# Patient Record
Sex: Female | Born: 1960 | Race: White | Hispanic: No | Smoking: Never smoker
Health system: Southern US, Community
[De-identification: ages and names within clinical notes are randomized; demographics above are authoritative.]

## PROBLEM LIST (undated history)

## (undated) DIAGNOSIS — E079 Disorder of thyroid, unspecified: Secondary | ICD-10-CM

## (undated) DIAGNOSIS — I2510 Atherosclerotic heart disease of native coronary artery without angina pectoris: Secondary | ICD-10-CM

## (undated) DIAGNOSIS — I011 Acute rheumatic endocarditis: Secondary | ICD-10-CM

## (undated) DIAGNOSIS — I10 Essential (primary) hypertension: Secondary | ICD-10-CM

## (undated) DIAGNOSIS — F319 Bipolar disorder, unspecified: Secondary | ICD-10-CM

## (undated) DIAGNOSIS — E7800 Pure hypercholesterolemia, unspecified: Secondary | ICD-10-CM

## (undated) DIAGNOSIS — M549 Dorsalgia, unspecified: Secondary | ICD-10-CM

## (undated) DIAGNOSIS — R519 Headache, unspecified: Secondary | ICD-10-CM

## (undated) DIAGNOSIS — E119 Type 2 diabetes mellitus without complications: Secondary | ICD-10-CM

## (undated) DIAGNOSIS — R51 Headache: Secondary | ICD-10-CM

## (undated) HISTORY — PX: TUBAL LIGATION: SHX77

## (undated) HISTORY — DX: Atherosclerotic heart disease of native coronary artery without angina pectoris: I25.10

## (undated) HISTORY — DX: Type 2 diabetes mellitus without complications: E11.9

## (undated) HISTORY — DX: Headache: R51

## (undated) HISTORY — DX: Acute rheumatic endocarditis: I01.1

## (undated) HISTORY — DX: Essential (primary) hypertension: I10

## (undated) HISTORY — DX: Bipolar disorder, unspecified: F31.9

## (undated) HISTORY — DX: Disorder of thyroid, unspecified: E07.9

## (undated) HISTORY — DX: Headache, unspecified: R51.9

## (undated) HISTORY — DX: Morbid (severe) obesity due to excess calories: E66.01

## (undated) HISTORY — DX: Pure hypercholesterolemia, unspecified: E78.00

## (undated) HISTORY — DX: Dorsalgia, unspecified: M54.9

---

## 2000-09-01 ENCOUNTER — Ambulatory Visit (HOSPITAL_COMMUNITY): Admission: RE | Admit: 2000-09-01 | Discharge: 2000-09-01 | Payer: Self-pay | Admitting: Endocrinology

## 2000-09-01 ENCOUNTER — Encounter: Payer: Self-pay | Admitting: Endocrinology

## 2001-01-14 ENCOUNTER — Emergency Department (HOSPITAL_COMMUNITY): Admission: EM | Admit: 2001-01-14 | Discharge: 2001-01-14 | Payer: Self-pay | Admitting: Emergency Medicine

## 2001-03-27 ENCOUNTER — Emergency Department (HOSPITAL_COMMUNITY): Admission: EM | Admit: 2001-03-27 | Discharge: 2001-03-27 | Payer: Self-pay | Admitting: *Deleted

## 2003-08-25 ENCOUNTER — Emergency Department (HOSPITAL_COMMUNITY): Admission: EM | Admit: 2003-08-25 | Discharge: 2003-08-25 | Payer: Self-pay | Admitting: Emergency Medicine

## 2004-03-13 ENCOUNTER — Ambulatory Visit (HOSPITAL_COMMUNITY): Admission: RE | Admit: 2004-03-13 | Discharge: 2004-03-13 | Payer: Self-pay | Admitting: Family Medicine

## 2005-01-28 ENCOUNTER — Other Ambulatory Visit: Admission: RE | Admit: 2005-01-28 | Discharge: 2005-01-28 | Payer: Self-pay | Admitting: Gynecology

## 2005-03-01 ENCOUNTER — Inpatient Hospital Stay (HOSPITAL_COMMUNITY): Admission: EM | Admit: 2005-03-01 | Discharge: 2005-03-04 | Payer: Self-pay | Admitting: Emergency Medicine

## 2005-03-17 ENCOUNTER — Ambulatory Visit (HOSPITAL_COMMUNITY): Admission: RE | Admit: 2005-03-17 | Discharge: 2005-03-17 | Payer: Self-pay | Admitting: Internal Medicine

## 2005-04-01 ENCOUNTER — Ambulatory Visit: Admission: RE | Admit: 2005-04-01 | Discharge: 2005-04-01 | Payer: Self-pay | Admitting: Gynecology

## 2005-04-06 ENCOUNTER — Emergency Department (HOSPITAL_COMMUNITY): Admission: EM | Admit: 2005-04-06 | Discharge: 2005-04-06 | Payer: Self-pay | Admitting: Emergency Medicine

## 2006-06-27 ENCOUNTER — Emergency Department (HOSPITAL_COMMUNITY): Admission: EM | Admit: 2006-06-27 | Discharge: 2006-06-27 | Payer: Self-pay | Admitting: Emergency Medicine

## 2006-10-04 ENCOUNTER — Emergency Department (HOSPITAL_COMMUNITY): Admission: EM | Admit: 2006-10-04 | Discharge: 2006-10-04 | Payer: Self-pay | Admitting: Emergency Medicine

## 2007-06-17 ENCOUNTER — Emergency Department (HOSPITAL_COMMUNITY): Admission: EM | Admit: 2007-06-17 | Discharge: 2007-06-17 | Payer: Self-pay | Admitting: Emergency Medicine

## 2008-04-06 ENCOUNTER — Emergency Department (HOSPITAL_COMMUNITY): Admission: EM | Admit: 2008-04-06 | Discharge: 2008-04-06 | Payer: Self-pay | Admitting: Emergency Medicine

## 2008-05-23 ENCOUNTER — Emergency Department (HOSPITAL_COMMUNITY): Admission: EM | Admit: 2008-05-23 | Discharge: 2008-05-23 | Payer: Self-pay | Admitting: Emergency Medicine

## 2009-06-27 ENCOUNTER — Observation Stay (HOSPITAL_COMMUNITY): Admission: EM | Admit: 2009-06-27 | Discharge: 2009-07-01 | Payer: Self-pay | Admitting: Emergency Medicine

## 2009-06-27 ENCOUNTER — Ambulatory Visit: Payer: Self-pay | Admitting: Cardiology

## 2009-06-30 ENCOUNTER — Encounter: Payer: Self-pay | Admitting: Family Medicine

## 2009-07-07 DIAGNOSIS — F3189 Other bipolar disorder: Secondary | ICD-10-CM | POA: Insufficient documentation

## 2009-07-07 DIAGNOSIS — J45909 Unspecified asthma, uncomplicated: Secondary | ICD-10-CM | POA: Insufficient documentation

## 2009-07-07 DIAGNOSIS — I10 Essential (primary) hypertension: Secondary | ICD-10-CM | POA: Insufficient documentation

## 2009-07-07 DIAGNOSIS — R079 Chest pain, unspecified: Secondary | ICD-10-CM

## 2009-07-09 ENCOUNTER — Ambulatory Visit: Payer: Self-pay | Admitting: Cardiology

## 2009-07-10 ENCOUNTER — Emergency Department (HOSPITAL_COMMUNITY): Admission: EM | Admit: 2009-07-10 | Discharge: 2009-07-10 | Payer: Self-pay | Admitting: Emergency Medicine

## 2009-07-30 ENCOUNTER — Ambulatory Visit: Payer: Self-pay | Admitting: Cardiology

## 2009-07-30 ENCOUNTER — Encounter (HOSPITAL_COMMUNITY): Admission: RE | Admit: 2009-07-30 | Discharge: 2009-08-29 | Payer: Self-pay | Admitting: Cardiology

## 2009-08-04 ENCOUNTER — Encounter: Payer: Self-pay | Admitting: Cardiology

## 2009-11-14 ENCOUNTER — Ambulatory Visit (HOSPITAL_COMMUNITY): Admission: RE | Admit: 2009-11-14 | Discharge: 2009-11-14 | Payer: Self-pay | Admitting: Otolaryngology

## 2009-12-01 ENCOUNTER — Ambulatory Visit (HOSPITAL_COMMUNITY): Admission: RE | Admit: 2009-12-01 | Discharge: 2009-12-01 | Payer: Self-pay | Admitting: Otolaryngology

## 2010-02-17 ENCOUNTER — Ambulatory Visit (HOSPITAL_COMMUNITY): Admission: RE | Admit: 2010-02-17 | Discharge: 2010-02-17 | Payer: Self-pay | Admitting: Pulmonary Disease

## 2010-02-18 ENCOUNTER — Ambulatory Visit (HOSPITAL_COMMUNITY): Admission: RE | Admit: 2010-02-18 | Discharge: 2010-02-18 | Payer: Self-pay | Admitting: Pulmonary Disease

## 2010-03-03 ENCOUNTER — Encounter (INDEPENDENT_AMBULATORY_CARE_PROVIDER_SITE_OTHER): Payer: Self-pay | Admitting: Pulmonary Disease

## 2010-03-03 ENCOUNTER — Ambulatory Visit (HOSPITAL_COMMUNITY): Admission: RE | Admit: 2010-03-03 | Discharge: 2010-03-03 | Payer: Self-pay | Admitting: Pulmonary Disease

## 2010-03-03 ENCOUNTER — Ambulatory Visit: Payer: Self-pay | Admitting: Cardiology

## 2010-03-07 IMAGING — NM NM MYOCAR MULTI W/SPECT W/WALL MOTION & EF
2 series · 12 of 12 positions shown · non-contrast
Comparison: none

nm myoview pharmacologic stress

Ordering Physician: Natanael Joon
Absah Physician: [REDACTED]al Data: 48-year-old woman with multiple cardiovascular risk
factors, asthma and recent hospital admission for chest pain.
NUCLEAR MEDICINE ADENOSINE STRESS MYOVIEW STUDY WITH SPECT AND LEFT
VENTRIUCLAR EJECTION FRACTION
Radionuclide Data: One-day rest/stress protocol performed with
[DATE] mCi of Zc-KKm Myoview.
Stress Data: Progressive dobutamine infusion of to a maximal dose
of 40 mcg/kg per minute resulted in a peak heart rate of 139, 81%
of age predicted maximum.   Blood pressure rose slightly at low
dose and and decreased modestly at high  dose with a subsequent
increase to at peak of 160/80 following termination of the drug
infusion.  No arrhythmias noted.
EKG: Normal sinus rhythm; probable prior anterior myocardial
infarction.
Stress EKG:  No significant change.
Scintigraphic Data: Acquisition  notable for moderate breast
attenuation plus a component of diaphragmatic attenuation.  Left
ventricular size was normal.  On tomographic images reconstructed
in standard planes, there was patchy uptake of tracer and poor
image quality due to substantial attenuation.  A small and mild
inferior defect was identified that was somewhat less apparent on
the resting study indicating modest reversibility.  A small
persistent defect of mild to moderate intensity is seen at the base
of the anterior wall.
The gated reconstruction demonstrated normal regional and global LV
systolic function as well as normal systolic accentuation of
activity throughout.  Estimated ejection fraction was 0.62.

[Series 1: cr cardiac tc low dose · 6.41mm/px · 6 of 64 frames shown]
[frame 6/64]
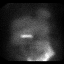
[frame 16/64]
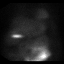
[frame 27/64]
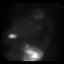
[frame 38/64]
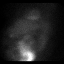
[frame 48/64]
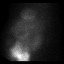
[frame 59/64]
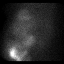

[Series 1: cs cardiac tc hi dose · 6.41mm/px · 6 of 512 frames shown]
[frame 43/512]
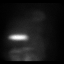
[frame 128/512]
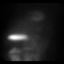
[frame 214/512]
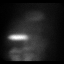
[frame 299/512]
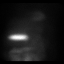
[frame 384/512]
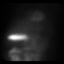
[frame 470/512]
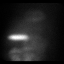

[12 of 12 positions shown; findings below may reference images not displayed]

IMPRESSION: Probably negative  pharmacologic stress nuclear myocardial study
revealing no significant stress-induced EKG abnormalities, normal
left ventricular size and normal left ventricular systolic
function.  By scintigraphic imaging, there was diaphragmatic and
breast attenuation without convincing evidence for ischemia or
infarction.  Other findings as noted.  The possibility of mild
scarring in the inferior and anterior segments can not be
unequivocally excluded.

## 2010-04-22 ENCOUNTER — Ambulatory Visit: Admission: RE | Admit: 2010-04-22 | Discharge: 2010-04-22 | Payer: Self-pay | Admitting: Neurology

## 2010-04-23 ENCOUNTER — Emergency Department (HOSPITAL_COMMUNITY): Admission: EM | Admit: 2010-04-23 | Discharge: 2010-04-23 | Payer: Self-pay | Admitting: Emergency Medicine

## 2010-05-14 ENCOUNTER — Other Ambulatory Visit: Payer: Self-pay | Admitting: Emergency Medicine

## 2010-05-15 ENCOUNTER — Other Ambulatory Visit: Payer: Self-pay | Admitting: Emergency Medicine

## 2010-05-15 ENCOUNTER — Inpatient Hospital Stay (HOSPITAL_COMMUNITY)
Admission: AD | Admit: 2010-05-15 | Discharge: 2010-05-22 | Payer: Self-pay | Source: Home / Self Care | Admitting: Psychiatry

## 2010-05-15 ENCOUNTER — Ambulatory Visit: Payer: Self-pay | Admitting: Psychiatry

## 2010-06-16 ENCOUNTER — Ambulatory Visit (HOSPITAL_COMMUNITY): Payer: Self-pay | Admitting: Physician Assistant

## 2010-07-09 ENCOUNTER — Ambulatory Visit (HOSPITAL_COMMUNITY): Payer: Self-pay | Admitting: Licensed Clinical Social Worker

## 2010-07-23 ENCOUNTER — Ambulatory Visit (HOSPITAL_COMMUNITY): Payer: Self-pay | Admitting: Licensed Clinical Social Worker

## 2010-07-31 ENCOUNTER — Ambulatory Visit (HOSPITAL_COMMUNITY): Payer: Self-pay | Admitting: Licensed Clinical Social Worker

## 2010-08-04 ENCOUNTER — Ambulatory Visit (HOSPITAL_COMMUNITY): Payer: Self-pay | Admitting: Licensed Clinical Social Worker

## 2010-08-12 ENCOUNTER — Ambulatory Visit (HOSPITAL_COMMUNITY): Payer: Self-pay | Admitting: Licensed Clinical Social Worker

## 2010-08-28 ENCOUNTER — Ambulatory Visit (HOSPITAL_COMMUNITY): Payer: Self-pay | Admitting: Licensed Clinical Social Worker

## 2010-09-03 ENCOUNTER — Ambulatory Visit (HOSPITAL_COMMUNITY): Payer: Self-pay | Admitting: Physician Assistant

## 2010-09-04 ENCOUNTER — Ambulatory Visit (HOSPITAL_COMMUNITY): Payer: Self-pay | Admitting: Licensed Clinical Social Worker

## 2010-09-04 ENCOUNTER — Emergency Department (HOSPITAL_COMMUNITY)
Admission: EM | Admit: 2010-09-04 | Discharge: 2010-09-05 | Disposition: A | Discharge status: Other Acute Inpatient Hospital | Payer: Self-pay | Source: Home / Self Care | Admitting: Emergency Medicine

## 2010-09-05 ENCOUNTER — Inpatient Hospital Stay (HOSPITAL_COMMUNITY)
Admission: RE | Admit: 2010-09-05 | Discharge: 2010-09-08 | Payer: Self-pay | Source: Home / Self Care | Attending: Psychiatry | Admitting: Psychiatry

## 2010-09-08 ENCOUNTER — Emergency Department (HOSPITAL_COMMUNITY)
Admission: EM | Admit: 2010-09-08 | Discharge: 2010-09-09 | Disposition: A | Discharge status: Other Acute Inpatient Hospital | Payer: Self-pay | Source: Home / Self Care | Admitting: Emergency Medicine

## 2010-09-09 ENCOUNTER — Ambulatory Visit (HOSPITAL_COMMUNITY): Payer: Self-pay | Admitting: Physician Assistant

## 2010-09-11 ENCOUNTER — Ambulatory Visit (HOSPITAL_COMMUNITY): Payer: Self-pay | Admitting: Licensed Clinical Social Worker

## 2010-10-18 ENCOUNTER — Encounter: Payer: Self-pay | Admitting: Family Medicine

## 2010-11-03 ENCOUNTER — Encounter (HOSPITAL_COMMUNITY): Payer: Self-pay | Admitting: Physician Assistant

## 2010-11-24 ENCOUNTER — Encounter (HOSPITAL_COMMUNITY): Payer: Medicare Other | Admitting: Physician Assistant

## 2010-11-24 DIAGNOSIS — F309 Manic episode, unspecified: Secondary | ICD-10-CM

## 2010-11-25 ENCOUNTER — Encounter (HOSPITAL_COMMUNITY): Payer: Medicare Other | Admitting: Physician Assistant

## 2010-12-07 LAB — URINALYSIS, ROUTINE W REFLEX MICROSCOPIC
Bilirubin Urine: NEGATIVE
Glucose, UA: NEGATIVE mg/dL
Nitrite: NEGATIVE
Protein, ur: NEGATIVE mg/dL
Specific Gravity, Urine: 1.02 (ref 1.005–1.030)
Urobilinogen, UA: 0.2 mg/dL (ref 0.0–1.0)
pH: 5 (ref 5.0–8.0)

## 2010-12-07 LAB — BASIC METABOLIC PANEL
CO2: 28 mEq/L (ref 19–32)
Calcium: 9.5 mg/dL (ref 8.4–10.5)
Creatinine, Ser: 1.23 mg/dL — ABNORMAL HIGH (ref 0.4–1.2)
GFR calc Af Amer: 56 mL/min — ABNORMAL LOW (ref >60)

## 2010-12-07 LAB — CBC
Hemoglobin: 11.9 g/dL — ABNORMAL LOW (ref 12.0–15.0)
MCH: 28.9 pg (ref 26.0–34.0)
MCHC: 34.5 g/dL (ref 30.0–36.0)
RDW: 13.9 % (ref 11.5–15.5)
WBC: 8.7 10*3/uL (ref 4.0–10.5)

## 2010-12-07 LAB — DIFFERENTIAL
Eosinophils Absolute: 0.4 10*3/uL (ref 0.0–0.7)
Eosinophils Relative: 5 % (ref 0–5)
Lymphocytes Relative: 25 % (ref 12–46)
Neutro Abs: 5.6 10*3/uL (ref 1.7–7.7)

## 2010-12-07 LAB — GLUCOSE, CAPILLARY

## 2010-12-07 LAB — URINE MICROSCOPIC-ADD ON

## 2010-12-07 LAB — RAPID URINE DRUG SCREEN, HOSP PERFORMED: Tetrahydrocannabinol: NOT DETECTED

## 2010-12-08 LAB — DIFFERENTIAL
Basophils Absolute: 0 K/uL (ref 0.0–0.1)
Basophils Relative: 1 % (ref 0–1)
Eosinophils Absolute: 0.4 K/uL (ref 0.0–0.7)
Eosinophils Relative: 6 % — ABNORMAL HIGH (ref 0–5)
Lymphocytes Relative: 31 % (ref 12–46)
Lymphs Abs: 2.3 10*3/uL (ref 0.7–4.0)
Monocytes Absolute: 0.4 K/uL (ref 0.1–1.0)
Monocytes Relative: 5 % (ref 3–12)
Neutro Abs: 4.3 K/uL (ref 1.7–7.7)
Neutrophils Relative %: 58 % (ref 43–77)

## 2010-12-08 LAB — URINALYSIS, ROUTINE W REFLEX MICROSCOPIC
Bilirubin Urine: NEGATIVE
Glucose, UA: NEGATIVE mg/dL
Ketones, ur: NEGATIVE mg/dL
Leukocytes, UA: NEGATIVE
Nitrite: NEGATIVE
Protein, ur: NEGATIVE mg/dL
Specific Gravity, Urine: 1.009 (ref 1.005–1.030)
Urobilinogen, UA: 0.2 mg/dL (ref 0.0–1.0)
pH: 5 (ref 5.0–8.0)

## 2010-12-08 LAB — GLUCOSE, CAPILLARY
Glucose-Capillary: 104 mg/dL — ABNORMAL HIGH (ref 70–99)
Glucose-Capillary: 155 mg/dL — ABNORMAL HIGH (ref 70–99)
Glucose-Capillary: 234 mg/dL — ABNORMAL HIGH (ref 70–99)
Glucose-Capillary: 49 mg/dL — ABNORMAL LOW (ref 70–99)
Glucose-Capillary: 91 mg/dL (ref 70–99)
Glucose-Capillary: 145 mg/dL — ABNORMAL HIGH (ref 70–99)
Glucose-Capillary: 176 mg/dL — ABNORMAL HIGH (ref 70–99)
Glucose-Capillary: 188 mg/dL — ABNORMAL HIGH (ref 70–99)
Glucose-Capillary: 258 mg/dL — ABNORMAL HIGH (ref 70–99)

## 2010-12-08 LAB — CBC
HCT: 38.8 % (ref 36.0–46.0)
Hemoglobin: 12.9 g/dL (ref 12.0–15.0)
MCH: 28.9 pg (ref 26.0–34.0)
MCHC: 33.2 g/dL (ref 30.0–36.0)
MCV: 87 fL (ref 78.0–100.0)
Platelets: 371 10*3/uL (ref 150–400)
RBC: 4.46 MIL/uL (ref 3.87–5.11)
RDW: 14.2 % (ref 11.5–15.5)
WBC: 7.5 10*3/uL (ref 4.0–10.5)

## 2010-12-08 LAB — BASIC METABOLIC PANEL
CO2: 27 mEq/L (ref 19–32)
Chloride: 100 mEq/L (ref 96–112)
Creatinine, Ser: 1 mg/dL (ref 0.4–1.2)
GFR calc Af Amer: >60 mL/min (ref >60)
Sodium: 138 mEq/L (ref 135–145)

## 2010-12-08 LAB — HEPATIC FUNCTION PANEL
ALT: 17 U/L (ref 0–35)
AST: 19 U/L (ref 0–37)
Albumin: 3.7 g/dL (ref 3.5–5.2)
Total Protein: 8.3 g/dL (ref 6.0–8.3)

## 2010-12-08 LAB — BASIC METABOLIC PANEL WITH GFR
BUN: 19 mg/dL (ref 6–23)
Calcium: 10.1 mg/dL (ref 8.4–10.5)
GFR calc non Af Amer: 59 mL/min — ABNORMAL LOW (ref >60)
Glucose, Bld: 190 mg/dL — ABNORMAL HIGH (ref 70–99)
Potassium: 3.8 meq/L (ref 3.5–5.1)

## 2010-12-08 LAB — RAPID URINE DRUG SCREEN, HOSP PERFORMED
Amphetamines: NOT DETECTED
Barbiturates: NOT DETECTED
Benzodiazepines: NOT DETECTED
Cocaine: NOT DETECTED
Opiates: NOT DETECTED
Tetrahydrocannabinol: NOT DETECTED

## 2010-12-08 LAB — URINE MICROSCOPIC-ADD ON

## 2010-12-08 LAB — ETHANOL: Alcohol, Ethyl (B): <5 mg/dL (ref 0–10)

## 2010-12-10 LAB — CBC
MCH: 29.1 pg (ref 26.0–34.0)
Platelets: 321 10*3/uL (ref 150–400)
RBC: 3.72 MIL/uL — ABNORMAL LOW (ref 3.87–5.11)
WBC: 5.7 10*3/uL (ref 4.0–10.5)

## 2010-12-10 LAB — BASIC METABOLIC PANEL
Calcium: 9 mg/dL (ref 8.4–10.5)
Creatinine, Ser: 1.03 mg/dL (ref 0.4–1.2)
GFR calc Af Amer: >60 mL/min (ref >60)
GFR calc non Af Amer: 57 mL/min — ABNORMAL LOW (ref >60)

## 2010-12-10 LAB — URINALYSIS, ROUTINE W REFLEX MICROSCOPIC
Bilirubin Urine: NEGATIVE
Hgb urine dipstick: NEGATIVE
Protein, ur: NEGATIVE mg/dL
Urobilinogen, UA: 0.2 mg/dL (ref 0.0–1.0)

## 2010-12-10 LAB — DIFFERENTIAL
Eosinophils Absolute: 0.4 10*3/uL (ref 0.0–0.7)
Lymphocytes Relative: 30 % (ref 12–46)
Lymphs Abs: 1.7 10*3/uL (ref 0.7–4.0)
Neutro Abs: 3.2 10*3/uL (ref 1.7–7.7)
Neutrophils Relative %: 56 % (ref 43–77)

## 2010-12-10 LAB — GLUCOSE, CAPILLARY
Glucose-Capillary: 192 mg/dL — ABNORMAL HIGH (ref 70–99)
Glucose-Capillary: 195 mg/dL — ABNORMAL HIGH (ref 70–99)
Glucose-Capillary: 237 mg/dL — ABNORMAL HIGH (ref 70–99)
Glucose-Capillary: 273 mg/dL — ABNORMAL HIGH (ref 70–99)

## 2010-12-10 LAB — HEPATIC FUNCTION PANEL
ALT: 23 U/L (ref 0–35)
Indirect Bilirubin: 0.2 mg/dL — ABNORMAL LOW (ref 0.3–0.9)
Total Protein: 7.3 g/dL (ref 6.0–8.3)

## 2010-12-10 LAB — SALICYLATE LEVEL: Salicylate Lvl: <4 mg/dL (ref 2.8–20.0)

## 2010-12-10 LAB — ETHANOL: Alcohol, Ethyl (B): <5 mg/dL (ref 0–10)

## 2010-12-10 LAB — ACETAMINOPHEN LEVEL: Acetaminophen (Tylenol), Serum: 15.8 ug/mL (ref 10–30)

## 2010-12-10 LAB — RAPID URINE DRUG SCREEN, HOSP PERFORMED
Amphetamines: POSITIVE — AB
Tetrahydrocannabinol: NOT DETECTED

## 2010-12-11 LAB — GLUCOSE, CAPILLARY
Glucose-Capillary: 131 mg/dL — ABNORMAL HIGH (ref 70–99)
Glucose-Capillary: 131 mg/dL — ABNORMAL HIGH (ref 70–99)
Glucose-Capillary: 142 mg/dL — ABNORMAL HIGH (ref 70–99)
Glucose-Capillary: 143 mg/dL — ABNORMAL HIGH (ref 70–99)
Glucose-Capillary: 153 mg/dL — ABNORMAL HIGH (ref 70–99)
Glucose-Capillary: 156 mg/dL — ABNORMAL HIGH (ref 70–99)
Glucose-Capillary: 185 mg/dL — ABNORMAL HIGH (ref 70–99)
Glucose-Capillary: 196 mg/dL — ABNORMAL HIGH (ref 70–99)
Glucose-Capillary: 199 mg/dL — ABNORMAL HIGH (ref 70–99)
Glucose-Capillary: 202 mg/dL — ABNORMAL HIGH (ref 70–99)
Glucose-Capillary: 204 mg/dL — ABNORMAL HIGH (ref 70–99)
Glucose-Capillary: 236 mg/dL — ABNORMAL HIGH (ref 70–99)
Glucose-Capillary: 255 mg/dL — ABNORMAL HIGH (ref 70–99)

## 2010-12-12 LAB — GLUCOSE, CAPILLARY: Glucose-Capillary: 168 mg/dL — ABNORMAL HIGH (ref 70–99)

## 2010-12-14 LAB — BLOOD GAS, ARTERIAL
Acid-Base Excess: 4.1 mmol/L — ABNORMAL HIGH (ref 0.0–2.0)
pCO2 arterial: 44.9 mmHg (ref 35.0–45.0)
pH, Arterial: 7.418 — ABNORMAL HIGH (ref 7.350–7.400)
pO2, Arterial: 62 mmHg — ABNORMAL LOW (ref 80.0–100.0)

## 2010-12-14 LAB — CREATININE, SERUM
Creatinine, Ser: 0.97 mg/dL (ref 0.4–1.2)
GFR calc Af Amer: >60 mL/min (ref >60)

## 2010-12-15 ENCOUNTER — Encounter (INDEPENDENT_AMBULATORY_CARE_PROVIDER_SITE_OTHER): Payer: Medicare Other | Admitting: Licensed Clinical Social Worker

## 2010-12-15 DIAGNOSIS — F319 Bipolar disorder, unspecified: Secondary | ICD-10-CM

## 2010-12-31 LAB — GLUCOSE, CAPILLARY
Glucose-Capillary: 150 mg/dL — ABNORMAL HIGH (ref 70–99)
Glucose-Capillary: 164 mg/dL — ABNORMAL HIGH (ref 70–99)
Glucose-Capillary: 191 mg/dL — ABNORMAL HIGH (ref 70–99)
Glucose-Capillary: 198 mg/dL — ABNORMAL HIGH (ref 70–99)
Glucose-Capillary: 205 mg/dL — ABNORMAL HIGH (ref 70–99)
Glucose-Capillary: 231 mg/dL — ABNORMAL HIGH (ref 70–99)
Glucose-Capillary: 291 mg/dL — ABNORMAL HIGH (ref 70–99)

## 2010-12-31 LAB — STOOL CULTURE

## 2010-12-31 LAB — HEMOCCULT GUIAC POC 1CARD (OFFICE): Fecal Occult Bld: NEGATIVE

## 2010-12-31 LAB — BASIC METABOLIC PANEL
BUN: 19 mg/dL (ref 6–23)
BUN: 22 mg/dL (ref 6–23)
CO2: 27 mEq/L (ref 19–32)
CO2: 29 mEq/L (ref 19–32)
Calcium: 9.5 mg/dL (ref 8.4–10.5)
Creatinine, Ser: 0.79 mg/dL (ref 0.4–1.2)
GFR calc non Af Amer: >60 mL/min (ref >60)
GFR calc non Af Amer: >60 mL/min (ref >60)
Glucose, Bld: 297 mg/dL — ABNORMAL HIGH (ref 70–99)
Glucose, Bld: 445 mg/dL — ABNORMAL HIGH (ref 70–99)
Potassium: 4.4 mEq/L (ref 3.5–5.1)
Sodium: 135 mEq/L (ref 135–145)
Sodium: 137 mEq/L (ref 135–145)

## 2010-12-31 LAB — HEPATIC FUNCTION PANEL
ALT: 33 U/L (ref 0–35)
AST: 26 U/L (ref 0–37)
Albumin: 3.7 g/dL (ref 3.5–5.2)
Bilirubin, Direct: <0.1 mg/dL (ref 0.0–0.3)

## 2010-12-31 LAB — CARDIAC PANEL(CRET KIN+CKTOT+MB+TROPI)
CK, MB: 0.7 ng/mL (ref 0.3–4.0)
CK, MB: 0.8 ng/mL (ref 0.3–4.0)
Relative Index: INVALID (ref 0.0–2.5)
Relative Index: INVALID (ref 0.0–2.5)
Total CK: 37 U/L (ref 7–177)
Total CK: 43 U/L (ref 7–177)
Troponin I: 0.02 ng/mL (ref 0.00–0.06)
Troponin I: 0.04 ng/mL (ref 0.00–0.06)

## 2010-12-31 LAB — CBC
MCHC: 35.2 g/dL (ref 30.0–36.0)
RDW: 14.7 % (ref 11.5–15.5)

## 2010-12-31 LAB — TSH: TSH: 0.948 u[IU]/mL (ref 0.350–4.500)

## 2010-12-31 LAB — LIPID PANEL: VLDL: UNDETERMINED mg/dL (ref 0–40)

## 2010-12-31 LAB — POCT CARDIAC MARKERS
CKMB, poc: 1 ng/mL (ref 1.0–8.0)
CKMB, poc: 1.6 ng/mL (ref 1.0–8.0)
Myoglobin, poc: 77.5 ng/mL (ref 12–200)

## 2010-12-31 LAB — DIFFERENTIAL
Basophils Absolute: 0 10*3/uL (ref 0.0–0.1)
Basophils Relative: 1 % (ref 0–1)
Monocytes Absolute: 0.4 10*3/uL (ref 0.1–1.0)
Neutro Abs: 3.9 10*3/uL (ref 1.7–7.7)
Neutrophils Relative %: 61 % (ref 43–77)

## 2010-12-31 LAB — OVA AND PARASITE EXAMINATION

## 2010-12-31 LAB — CLOSTRIDIUM DIFFICILE EIA: C difficile Toxins A+B, EIA: NEGATIVE

## 2011-01-11 ENCOUNTER — Encounter (HOSPITAL_BASED_OUTPATIENT_CLINIC_OR_DEPARTMENT_OTHER): Payer: Medicare Other | Admitting: Licensed Clinical Social Worker

## 2011-01-11 DIAGNOSIS — F319 Bipolar disorder, unspecified: Secondary | ICD-10-CM

## 2011-01-12 ENCOUNTER — Encounter (HOSPITAL_COMMUNITY): Payer: Medicare Other | Admitting: Physician Assistant

## 2011-01-12 DIAGNOSIS — F309 Manic episode, unspecified: Secondary | ICD-10-CM

## 2011-01-29 ENCOUNTER — Encounter (HOSPITAL_BASED_OUTPATIENT_CLINIC_OR_DEPARTMENT_OTHER): Payer: Medicare Other | Admitting: Licensed Clinical Social Worker

## 2011-01-29 DIAGNOSIS — F319 Bipolar disorder, unspecified: Secondary | ICD-10-CM

## 2011-02-02 ENCOUNTER — Encounter (HOSPITAL_COMMUNITY): Payer: Medicare Other | Admitting: Physician Assistant

## 2011-02-02 DIAGNOSIS — F319 Bipolar disorder, unspecified: Secondary | ICD-10-CM

## 2011-02-12 NOTE — H&P (Signed)
[name], [name]             ACCOUNT NO.:  [id number]   MEDICAL RECORD NO.:  [id number]          PATIENT TYPE:  INP   LOCATION:  A204                          FACILITY:  [facility]   PHYSICIAN:  Madelin Rear. Sherwood Gambler, MD  DATE OF BIRTH:  [birth year]   DATE OF ADMISSION:  03/01/2005  DATE OF DISCHARGE:  [facility]                                HISTORY & PHYSICAL   CHIEF COMPLAINT:  Seizures.   HISTORY OF PRESENT ILLNESS:  The patient had the abrupt onset, without  warning, of a generalized tonic-colonic grand mal seizure according to EMS  personnel. They responded on scene in [place] and found her to be  hypoglycemic and offered transport to the hospital.  She refused transport  when she was alert and awake enough to do that.  A concerned significant  other, however, presented her to the emergency department at [facility] for evaluation.  She had no new weakness, altered sensation, or  visual problems. She did have impaired speech with frequent asking of  questions, repeating herself, and sometimes not making any sense according  to her relative.  She usually walks without assistance and has no focal  neurologic deficits.  She had confusion associated with the seizure for a  prolonged period of time.  It did not respond to Narcan nor correction of  her blood sugar, here in the hospital, which is monitored by Dr. Colon Branch.   PAST MEDICAL HISTORY:  1.  Morbid obesity.  2.  Non-insulin-dependent diabetes mellitus.  3.  Migraine headaches.  4.  Hypertension.  5.  Hyperlipidemia.  6.  Sleep apnea.  7.  Tendinitis.   SOCIAL HISTORY:  Nonsmoker, nondrinker.  No alcohol use or other drug use.   FAMILY HISTORY:  Noncontributory.   REVIEW OF SYSTEMS:  As under HPI, otherwise negative.   PHYSICAL EXAMINATION:  GENERAL:  She is easily arousable and nonfocal.  HEAD AND NECK:  Showed no JVD or adenopathy.  Neck is supple.  CHEST:  Clear.  CARDIAC EXAM:  Regular rhythm without murmur,  gallop, or rub.  ABDOMEN:  Soft.  No organomegaly or masses.  EXTREMITIES:  Without clubbing, cyanosis, or edema.  NEUROLOGIC:  She rapidly falls back asleep with snoring sonorous  respirations.  She, again, is easily arousable to light tactile as well as  verbal stimulation.  She does have episodes of incoherence, but there is no  cranial nerve deficit or aphasia present.   IMPRESSION:  1.  Prolonged postictal state with confusion.  Patient will be monitored.      Blood sugar will be monitored and intervene as necessary.  2.  Seizures.  Her urine drug screen showed positive benzodiazepines and      opiates which she chronically uses.  When her mental status clears we      will get a better idea whether she abruptly stopped benzodiazepines      resulting in the seizure contributing to the hypoglycemia that was      apparently documented.  3.  Hypoglycemia.  The patient has been noncompliant with diet and possibly  noncompliant with medications as prescribed according to a family      member.  We will educate her on this and hold all oral hypoglycemic      agents at this point until we see what her sugar does and just cover her      with insulin.  We will maintain her on a D-10 drip to avoid further      drops in blood sugar.  4.  Hypertension.  Acute intervention is needed with expectant observation.  5.  Tendinitis, asymptomatic at present.  Monitor and intervene as needed.  6.  Spleen apnea.  I will monitor and also reinforce the need for CPAP at      home.       LJF/MEDQ  D:  03/01/2005  T:  03/01/2005  Job:  [id number]

## 2011-02-12 NOTE — Discharge Summary (Signed)
NAMEDAMONIE, FURNEY             ACCOUNT NO.:  [id number]   MEDICAL RECORD NO.:  [id number]          PATIENT TYPE:  INP   LOCATION:  A206                          FACILITY:  [facility]   PHYSICIAN:  Madelin Rear. Sherwood Gambler, MD  DATE OF BIRTH:  January 05, 1961   DATE OF ADMISSION:  03/01/2005  DATE OF DISCHARGE:  06/08/2006LH                                 DISCHARGE SUMMARY   DISCHARGE DIAGNOSES:  1.  Hypoglycemia.  2.  Seizures secondary to #1.  3.  Morbid obesity.  4.  Hypertension.  5.  Long term controlled substance use.   DISCHARGE MEDICATIONS:  1.  Zestril 10 mg p.o. daily.  2.  Toprol XL 50 mg p.o. daily.  3.  Xopenex nebulizer's PRN, 1.25 q.i.d.  4.  Xanax 0.5 mg p.o. t.i.d. to q.i.d. PRN.   HOSPITAL COURSE:  Patient was admitted with postictal confusion after a  seizure documented as secondary to hypoglycemia.  She had severe diet change  with severe calorie restriction, increased activity, which precipitated a  low blood sugar.  She remained in the hospital.  Her mental status improved  to normal baseline and she was discharged stable for follow up in the  office.       LJF/MEDQ  D:  03/04/2005  T:  03/04/2005  Job:  [id number]

## 2011-02-16 ENCOUNTER — Encounter (HOSPITAL_BASED_OUTPATIENT_CLINIC_OR_DEPARTMENT_OTHER): Payer: Medicare Other | Admitting: Licensed Clinical Social Worker

## 2011-02-16 DIAGNOSIS — F319 Bipolar disorder, unspecified: Secondary | ICD-10-CM

## 2011-03-08 ENCOUNTER — Encounter (HOSPITAL_COMMUNITY): Payer: Medicare Other | Admitting: Licensed Clinical Social Worker

## 2011-03-08 DIAGNOSIS — F319 Bipolar disorder, unspecified: Secondary | ICD-10-CM

## 2011-03-25 ENCOUNTER — Encounter (HOSPITAL_BASED_OUTPATIENT_CLINIC_OR_DEPARTMENT_OTHER): Payer: Medicare Other | Admitting: Licensed Clinical Social Worker

## 2011-03-25 DIAGNOSIS — F319 Bipolar disorder, unspecified: Secondary | ICD-10-CM

## 2011-04-12 ENCOUNTER — Encounter (HOSPITAL_BASED_OUTPATIENT_CLINIC_OR_DEPARTMENT_OTHER): Payer: Medicare Other | Admitting: Licensed Clinical Social Worker

## 2011-04-12 DIAGNOSIS — F319 Bipolar disorder, unspecified: Secondary | ICD-10-CM

## 2011-04-20 ENCOUNTER — Encounter (HOSPITAL_COMMUNITY): Payer: Medicare Other | Admitting: Physician Assistant

## 2011-04-21 ENCOUNTER — Encounter (HOSPITAL_COMMUNITY): Payer: Medicare Other | Admitting: Physician Assistant

## 2011-04-21 DIAGNOSIS — F319 Bipolar disorder, unspecified: Secondary | ICD-10-CM

## 2011-04-26 ENCOUNTER — Encounter (HOSPITAL_BASED_OUTPATIENT_CLINIC_OR_DEPARTMENT_OTHER): Payer: Medicare Other | Admitting: Licensed Clinical Social Worker

## 2011-04-26 DIAGNOSIS — F319 Bipolar disorder, unspecified: Secondary | ICD-10-CM

## 2011-05-10 ENCOUNTER — Encounter (HOSPITAL_BASED_OUTPATIENT_CLINIC_OR_DEPARTMENT_OTHER): Payer: Medicare Other | Admitting: Licensed Clinical Social Worker

## 2011-05-10 DIAGNOSIS — F319 Bipolar disorder, unspecified: Secondary | ICD-10-CM

## 2011-05-20 ENCOUNTER — Encounter (HOSPITAL_COMMUNITY): Payer: Medicare Other | Admitting: Physician Assistant

## 2011-05-26 ENCOUNTER — Encounter (INDEPENDENT_AMBULATORY_CARE_PROVIDER_SITE_OTHER): Payer: Medicare Other | Admitting: Physician Assistant

## 2011-05-26 DIAGNOSIS — F319 Bipolar disorder, unspecified: Secondary | ICD-10-CM

## 2011-05-28 ENCOUNTER — Encounter (INDEPENDENT_AMBULATORY_CARE_PROVIDER_SITE_OTHER): Payer: Medicare Other | Admitting: Licensed Clinical Social Worker

## 2011-05-28 DIAGNOSIS — F319 Bipolar disorder, unspecified: Secondary | ICD-10-CM

## 2011-06-22 ENCOUNTER — Encounter (HOSPITAL_BASED_OUTPATIENT_CLINIC_OR_DEPARTMENT_OTHER): Payer: Medicare Other | Admitting: Licensed Clinical Social Worker

## 2011-06-22 DIAGNOSIS — F319 Bipolar disorder, unspecified: Secondary | ICD-10-CM

## 2011-07-06 ENCOUNTER — Encounter (INDEPENDENT_AMBULATORY_CARE_PROVIDER_SITE_OTHER): Payer: Medicare Other | Admitting: Physician Assistant

## 2011-07-06 DIAGNOSIS — F319 Bipolar disorder, unspecified: Secondary | ICD-10-CM

## 2011-07-08 LAB — BASIC METABOLIC PANEL
BUN: 17
CO2: 26
Chloride: 95 — ABNORMAL LOW
GFR calc non Af Amer: 58 — ABNORMAL LOW
Glucose, Bld: 452 — ABNORMAL HIGH
Potassium: 4.4
Sodium: 130 — ABNORMAL LOW

## 2011-07-09 ENCOUNTER — Encounter (INDEPENDENT_AMBULATORY_CARE_PROVIDER_SITE_OTHER): Payer: Medicare Other | Admitting: Licensed Clinical Social Worker

## 2011-07-09 DIAGNOSIS — F319 Bipolar disorder, unspecified: Secondary | ICD-10-CM

## 2011-07-23 ENCOUNTER — Encounter (HOSPITAL_COMMUNITY): Payer: Medicare Other | Admitting: Licensed Clinical Social Worker

## 2011-08-30 ENCOUNTER — Other Ambulatory Visit (HOSPITAL_COMMUNITY): Payer: Self-pay | Admitting: *Deleted

## 2011-08-30 DIAGNOSIS — F319 Bipolar disorder, unspecified: Secondary | ICD-10-CM

## 2011-08-30 MED ORDER — THIOTHIXENE 5 MG PO CAPS: 5.0000 mg | ORAL_CAPSULE | Freq: Every day | ORAL | Status: DC

## 2011-09-01 ENCOUNTER — Ambulatory Visit (INDEPENDENT_AMBULATORY_CARE_PROVIDER_SITE_OTHER): Payer: Medicare Other | Admitting: Physician Assistant

## 2011-09-01 DIAGNOSIS — F319 Bipolar disorder, unspecified: Secondary | ICD-10-CM

## 2011-09-01 MED ORDER — LAMOTRIGINE 150 MG PO TABS: ORAL_TABLET | ORAL | Status: DC

## 2011-09-01 MED ORDER — ZIPRASIDONE HCL 80 MG PO CAPS: 80.0000 mg | ORAL_CAPSULE | Freq: Every day | ORAL | Status: DC

## 2011-09-01 MED ORDER — LITHIUM CARBONATE ER 300 MG PO TBCR: EXTENDED_RELEASE_TABLET | ORAL | Status: DC

## 2011-09-17 ENCOUNTER — Encounter: Payer: Self-pay | Admitting: Cardiology

## 2011-09-23 ENCOUNTER — Telehealth (HOSPITAL_COMMUNITY): Payer: Self-pay | Admitting: Physician Assistant

## 2011-09-23 NOTE — Telephone Encounter (Signed)
See telephone note.

## 2011-09-23 NOTE — Progress Notes (Signed)
   [facility] Behavioral Health Follow-up Outpatient Visit  SILVA AAMODT 11-Jun-1961  Date: 09/01/11   Subjective: Clemma presents today reporting that her tremor has gotten no better since we decreased the lithium from 1200 mg daily to 900 mg daily. She continues to endorse mood swings. Her tremor is causing her trouble healing and writing. Her sleep is poor. She endorses having problems both falling asleep as well as staying asleep. She has tried melatonin in the past. It worked for a while, but then stopped working. She also complains of increased anxiety. She asked if we could try to decrease the lithium so more in order to alleviate the tremor.  There were no vitals filed for this visit.  Mental Status Examination  Appearance: Casual Alert: Yes Attention: good  Cooperative: Yes Eye Contact: Good Speech: Clear and even Psychomotor Activity: Increased Memory/Concentration: Fair Oriented: person, place, time/date and situation Mood: Depressed Affect: Congruent Thought Processes and Associations: Logical Fund of Knowledge: Fair Thought Content:  Insight: Fair Judgement: Good  Diagnosis: Bipolar disorder, not otherwise specified  Treatment Plan: We will decrease her lithium to 900 mg daily and start her on Geodon to target mood swings and sleep. She will return in one month.  Zebedee Segundo, PA

## 2011-10-06 ENCOUNTER — Ambulatory Visit (INDEPENDENT_AMBULATORY_CARE_PROVIDER_SITE_OTHER): Payer: Medicare Other | Admitting: Physician Assistant

## 2011-10-06 DIAGNOSIS — F319 Bipolar disorder, unspecified: Secondary | ICD-10-CM

## 2011-10-06 MED ORDER — ZIPRASIDONE HCL 60 MG PO CAPS: 120.0000 mg | ORAL_CAPSULE | Freq: Every day | ORAL | Status: DC

## 2011-10-06 MED ORDER — THIOTHIXENE 5 MG PO CAPS: 5.0000 mg | ORAL_CAPSULE | Freq: Every day | ORAL | Status: DC

## 2011-10-06 NOTE — Progress Notes (Signed)
   [facility] Behavioral Health Follow-up Outpatient Visit  [name] 10-05-1960  Date: 10/06/11   Subjective: [name] presents today to followup on the medication changes that were made at her last appointment. She was unable to start the Geodon until approximately one week ago because she found herself in the doughnut hole with Medicare. She is currently taking Geodon 80 mg at about 6:30 PM, approximately one hour after eating dinner. She denies any bothersome side effects and reports that she is sleeping better. She is complaining of increased anxiety. She had a difficult holiday season as she continues to grieve the death of her mother. She denies any suicidal or homicidal ideation. She denies any visual or auditory hallucinations. Her tremor has improved somewhat since we have decreased her lithium to 600 mg daily.  There were no vitals filed for this visit.  Mental Status Examination  Appearance: Casual Alert: Yes Attention: good  Cooperative: Yes Eye Contact: Good Speech: Clear and even Psychomotor Activity: Normal Memory/Concentration: Intact Oriented: person, place, time/date and situation Mood: Anxious Affect: Congruent Thought Processes and Associations: Linear Fund of Knowledge: Fair Thought Content:  Insight: Fair Judgement: Good  Diagnosis: Bipolar disorder, most recent episode depressed  Treatment Plan: We will continue her medications as currently prescribed, including Geodon 80 mg in the evening, lithium 600 mg daily, and Navave, 5 mg at bedtime. Also, we will continue her Lamictal at 300 mg at bedtime. When this prescription of Geodon is completed. We will increase her dose to 120 mg each evening. [name] will return for followup in 6 weeks.  Thara Searing, PA

## 2011-10-08 ENCOUNTER — Ambulatory Visit (HOSPITAL_COMMUNITY): Payer: Medicare Other | Admitting: Licensed Clinical Social Worker

## 2011-10-25 ENCOUNTER — Ambulatory Visit (INDEPENDENT_AMBULATORY_CARE_PROVIDER_SITE_OTHER): Payer: Medicare Other | Admitting: Licensed Clinical Social Worker

## 2011-10-25 ENCOUNTER — Encounter (HOSPITAL_COMMUNITY): Payer: Self-pay | Admitting: Licensed Clinical Social Worker

## 2011-10-25 DIAGNOSIS — F314 Bipolar disorder, current episode depressed, severe, without psychotic features: Secondary | ICD-10-CM
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
## 2011-10-26 ENCOUNTER — Other Ambulatory Visit (HOSPITAL_COMMUNITY): Payer: Self-pay | Admitting: Psychology

## 2011-11-02 ENCOUNTER — Other Ambulatory Visit (HOSPITAL_COMMUNITY): Payer: Self-pay | Admitting: Psychology

## 2011-11-02 DIAGNOSIS — F319 Bipolar disorder, unspecified: Secondary | ICD-10-CM

## 2011-11-02 MED ORDER — ZIPRASIDONE HCL 60 MG PO CAPS: 120.0000 mg | ORAL_CAPSULE | Freq: Every day | ORAL | Status: DC

## 2011-11-09 ENCOUNTER — Other Ambulatory Visit (HOSPITAL_COMMUNITY): Payer: Self-pay | Admitting: *Deleted

## 2011-11-09 DIAGNOSIS — F319 Bipolar disorder, unspecified: Secondary | ICD-10-CM

## 2011-11-09 MED ORDER — THIOTHIXENE 5 MG PO CAPS: 5.0000 mg | ORAL_CAPSULE | Freq: Every day | ORAL | Status: DC

## 2011-11-15 ENCOUNTER — Ambulatory Visit (INDEPENDENT_AMBULATORY_CARE_PROVIDER_SITE_OTHER): Payer: Medicare Other | Admitting: Licensed Clinical Social Worker

## 2011-11-15 DIAGNOSIS — F314 Bipolar disorder, current episode depressed, severe, without psychotic features: Secondary | ICD-10-CM
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
## 2011-11-17 ENCOUNTER — Ambulatory Visit (INDEPENDENT_AMBULATORY_CARE_PROVIDER_SITE_OTHER): Payer: Medicare Other | Admitting: Physician Assistant

## 2011-11-17 DIAGNOSIS — F319 Bipolar disorder, unspecified: Secondary | ICD-10-CM

## 2011-11-17 MED ORDER — MIRTAZAPINE 15 MG PO TABS: 15.0000 mg | ORAL_TABLET | Freq: Every day | ORAL | Status: DC

## 2011-11-17 NOTE — Progress Notes (Signed)
   [facility] Behavioral Health Follow-up Outpatient Visit  [name] 1961-04-27  Date: 11/17/11   Subjective: [name] presents today to followup on her medications for bipolar disorder. She states that her therapist instructed her to ask for medication for depression and sleep. She continues to have a markedly tremor, in spite of the fact that we have decreased her lithium by half. She reports that she has been depressed for approximately one week now. She endorses that she experiences these periods of depression on occasion that last between a week and 6 weeks. She seems somewhat confused about the dose of Geodon that she is currently taking, but assures this Clinical research associate that she is taking it as prescribed. She reports taking all of her other medications as prescribed as well. She denies any current suicidal or homicidal ideation she denies any auditory or visual hallucinations.  There were no vitals filed for this visit.  Mental Status Examination  Appearance: Casual Alert: Yes Attention: good  Cooperative: Yes Eye Contact: Good Speech: Clear and even Psychomotor Activity: Psychomotor Retardation Memory/Concentration: Memory impaired/concentration intact Oriented: person, place, time/date and situation Mood: Depressed Affect: Flat Thought Processes and Associations: Circumstantial Fund of Knowledge: Fair Thought Content:  Insight: Fair Judgement: Good  Diagnosis: Bipolar disorder  Treatment Plan: Decrease lithium to 300 mg daily, start Remeron 15 mg at bedtime to target sleep, continue Lamictal 300 mg daily, Navane 5 mg at bedtime, and Geodon 120 mg at bedtime. She will followup in 4 weeks.  Arnoldo Hildreth, PA

## 2011-12-06 ENCOUNTER — Encounter (HOSPITAL_COMMUNITY): Payer: Self-pay | Admitting: Licensed Clinical Social Worker

## 2011-12-06 ENCOUNTER — Ambulatory Visit (INDEPENDENT_AMBULATORY_CARE_PROVIDER_SITE_OTHER): Payer: No Typology Code available for payment source | Admitting: Licensed Clinical Social Worker

## 2011-12-06 DIAGNOSIS — F319 Bipolar disorder, unspecified: Secondary | ICD-10-CM

## 2011-12-06 DIAGNOSIS — F3130 Bipolar disorder, current episode depressed, mild or moderate severity, unspecified: Secondary | ICD-10-CM
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
## 2011-12-13 ENCOUNTER — Ambulatory Visit (INDEPENDENT_AMBULATORY_CARE_PROVIDER_SITE_OTHER): Payer: Medicare Other | Admitting: Physician Assistant

## 2011-12-13 DIAGNOSIS — F319 Bipolar disorder, unspecified: Secondary | ICD-10-CM

## 2011-12-13 MED ORDER — MIRTAZAPINE 30 MG PO TABS: 30.0000 mg | ORAL_TABLET | Freq: Every day | ORAL | Status: DC

## 2011-12-13 NOTE — Progress Notes (Signed)
   [facility] Behavioral Health Follow-up Outpatient Visit  SHERRAL DIROCCO 1961/09/09  Date: 12/13/11   Subjective: Zeniyah presents today to followup on her medications prescribed for bipolar disorder. She complains that she is unable to sleep through the night. She states it takes her about one hour to fall asleep and she wakes up after about 3 hours and is unable to go back to sleep.  She feels that the Remeron is helpful, but she continues to complain about insomnia. She takes the Remeron about 2 hours before going to bed. She reports that her mood has been stable. She denies any auditory or visual hallucinations. She denies any suicidal or homicidal thoughts. Her tremor seems to have significantly decreased. She plans to attend and information  session tomorrow evening regarding surgery for weight reduction.  There were no vitals filed for this visit.  Mental Status Examination  Appearance: Well groomed and casually dressed Alert: Yes Attention: good  Cooperative: Yes Eye Contact: Good Speech: Clear and even Psychomotor Activity: Normal and Tremor Memory/Concentration: Intact Oriented: person, place, time/date and situation Mood: Depressed, mildly Affect: Blunt Thought Processes and Associations: Coherent and Logical Fund of Knowledge: Fair Thought Content:  Insight: Fair Judgement: Fair  Diagnosis: Bipolar 2  Treatment Plan: We will increase her Remeron to 30 mg nightly, and have her take it closer to bedtime.  We will continue her Geodon at 120 mg after dinner, Navane 5 mg at bedtime, insulin 300 mg daily, and Lamictal 300 mg daily. She will return for followup in one month. At that time his sleep has not improved we may consider temazepam or Klonopin.  Loann Chahal, PA

## 2011-12-14 ENCOUNTER — Telehealth (HOSPITAL_COMMUNITY): Payer: Self-pay | Admitting: Licensed Clinical Social Worker

## 2011-12-14 NOTE — Telephone Encounter (Signed)
Message copied by Remus Loffler on Tue Dec 14, 2011  9:23 AM ------      Message from: Pat Kocher A      Created: Tue Dec 14, 2011  8:54 AM      Regarding: NEED TO TALK WITH YOU      Contact: [phone]       8:55AM 12/14/11            Aamir Mclinden,                  PLEASE CALL (CARMEN) DAUGHTER OF THE PATIENT NEED TO SPEAK WITH YOU ABOUT HER MOM.                              THANKS      SYLVIA
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
## 2011-12-14 NOTE — Telephone Encounter (Signed)
Message copied by Remus Loffler on Tue Dec 14, 2011  9:14 AM ------      Message from: Pat Kocher A      Created: Tue Dec 14, 2011  8:54 AM      Regarding: NEED TO TALK WITH YOU      Contact: [phone]       8:55AM 12/14/11            Raeshaun Simson,                  PLEASE CALL (CARMEN) DAUGHTER OF THE PATIENT NEED TO SPEAK WITH YOU ABOUT HER MOM.                              THANKS      SYLVIA
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
## 2011-12-14 NOTE — Telephone Encounter (Signed)
Message copied by Remus Loffler on Tue Dec 14, 2011  9:16 AM ------      Message from: Pat Kocher A      Created: Tue Dec 14, 2011  8:54 AM      Regarding: NEED TO TALK WITH YOU      Contact: [phone]       8:55AM 12/14/11            Eleftherios Dudenhoeffer,                  PLEASE CALL (CARMEN) DAUGHTER OF THE PATIENT NEED TO SPEAK WITH YOU ABOUT HER MOM.                              THANKS      SYLVIA

## 2011-12-16 ENCOUNTER — Other Ambulatory Visit (HOSPITAL_COMMUNITY): Payer: Self-pay | Admitting: *Deleted

## 2011-12-16 DIAGNOSIS — F319 Bipolar disorder, unspecified: Secondary | ICD-10-CM

## 2011-12-16 MED ORDER — THIOTHIXENE 5 MG PO CAPS: 5.0000 mg | ORAL_CAPSULE | Freq: Every day | ORAL | Status: DC

## 2011-12-16 MED ORDER — ZIPRASIDONE HCL 60 MG PO CAPS: 120.0000 mg | ORAL_CAPSULE | Freq: Every day | ORAL | Status: DC

## 2011-12-20 ENCOUNTER — Telehealth (HOSPITAL_COMMUNITY): Payer: Self-pay | Admitting: *Deleted

## 2011-12-20 NOTE — Telephone Encounter (Signed)
Patient called earlier,left VM.Geodon >$120.00 when she went to pick up.Can not afford.In 'donut hole' with Medicare.Wanted to know if she could stop taking it. Informed Hessie Diener of call. Per Hessie Diener, would be better not to stop abruptly, but if she cannot afford medicine there is nothing left to do. Patient said she took the last one last night, and will let us know if she begins to feel worse.

## 2011-12-27 ENCOUNTER — Ambulatory Visit (INDEPENDENT_AMBULATORY_CARE_PROVIDER_SITE_OTHER): Payer: Medicare Other | Admitting: Licensed Clinical Social Worker

## 2011-12-27 ENCOUNTER — Encounter (HOSPITAL_COMMUNITY): Payer: Self-pay | Admitting: Licensed Clinical Social Worker

## 2011-12-27 ENCOUNTER — Other Ambulatory Visit (HOSPITAL_COMMUNITY): Payer: Self-pay | Admitting: *Deleted

## 2011-12-27 DIAGNOSIS — F3130 Bipolar disorder, current episode depressed, mild or moderate severity, unspecified: Secondary | ICD-10-CM

## 2011-12-27 DIAGNOSIS — F319 Bipolar disorder, unspecified: Secondary | ICD-10-CM | POA: Insufficient documentation

## 2011-12-27 MED ORDER — RISPERIDONE 1 MG PO TABS: 1.0000 mg | ORAL_TABLET | Freq: Every day | ORAL | Status: DC

## 2011-12-27 NOTE — Telephone Encounter (Signed)
Patient has been unable to continue Geodon due to cost. Was to let us know if she began to feel bad. In office to see Geanie Berlin today, and says she is not feeling well after discontinuing Geodon. Jorje Guild PA consulted by Belenda Cruise. Jorje Guild PA ordered Risperidone 1mg  at [facility], until patient gets out of Medicare "donut hole" and can afford Geodon again. Risperidone ordered via [employer] on the order of [employer]
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
## 2012-01-14 ENCOUNTER — Other Ambulatory Visit (HOSPITAL_COMMUNITY): Payer: Self-pay | Admitting: Psychology

## 2012-01-14 DIAGNOSIS — F319 Bipolar disorder, unspecified: Secondary | ICD-10-CM

## 2012-01-14 MED ORDER — THIOTHIXENE 5 MG PO CAPS: 5.0000 mg | ORAL_CAPSULE | Freq: Every day | ORAL | Status: DC

## 2012-01-14 MED ORDER — MIRTAZAPINE 30 MG PO TABS: 30.0000 mg | ORAL_TABLET | Freq: Every day | ORAL | Status: DC

## 2012-01-17 ENCOUNTER — Ambulatory Visit (INDEPENDENT_AMBULATORY_CARE_PROVIDER_SITE_OTHER): Payer: Medicare Other | Admitting: Physician Assistant

## 2012-01-17 DIAGNOSIS — F319 Bipolar disorder, unspecified: Secondary | ICD-10-CM

## 2012-01-17 MED ORDER — LAMOTRIGINE 200 MG PO TABS: 400.0000 mg | ORAL_TABLET | Freq: Every day | ORAL | Status: DC

## 2012-01-17 NOTE — Progress Notes (Signed)
   [facility] Behavioral Health Follow-up Outpatient Visit  [name] Feb 10, 1961  Date: 01/17/2012   Subjective: [name] presents today to followup on medications prescribed for bipolar disorder. We have had to change her from Geodon to Risperdal because the cost of Geodon was too much while she is in her Medicare doughnut hole. She reports that she is doing well on Risperdal, and has not noticed any increase in appetite. She endorses poor sleep, but states that is not problematic. She reports that her mood has been somewhat sad. She denies any suicidal or homicidal ideation. She denies any auditory or visual hallucinations. She is now down to taking one 300 mg capsule of lithium at bedtime, and her tremor is nearly completely gone. She continues to see [name] approximately every 3 weeks. She states that she feels the best she has been a long time.  There were no vitals filed for this visit.  Mental Status Examination  Appearance: Casual Alert: Yes Attention: good  Cooperative: Yes Eye Contact: Good Speech: Clear and even Psychomotor Activity: Normal Memory/Concentration: Intact Oriented: person, place, time/date and situation Mood: Dysphoric Affect: Congruent Thought Processes and Associations: Coherent, Intact and Logical Fund of Knowledge: Good Thought Content: Normal Insight: Good Judgement: Good  Diagnosis: Bipolar disorder, most recent episode depression  Treatment Plan: We will increase her lumbrical to 400 mg daily. We will continue her lithium at 300 mg at bedtime, Remeron 30 mg at bedtime, Risperdal 1 mg at bedtime, and Navane 5 mg at bedtime. She will followup in one month.  Maria Berland, PA-C

## 2012-01-18 ENCOUNTER — Ambulatory Visit (INDEPENDENT_AMBULATORY_CARE_PROVIDER_SITE_OTHER): Payer: Medicare Other | Admitting: Licensed Clinical Social Worker

## 2012-01-18 ENCOUNTER — Encounter (HOSPITAL_COMMUNITY): Payer: Self-pay | Admitting: Licensed Clinical Social Worker

## 2012-01-18 DIAGNOSIS — F319 Bipolar disorder, unspecified: Secondary | ICD-10-CM

## 2012-01-18 DIAGNOSIS — F3130 Bipolar disorder, current episode depressed, mild or moderate severity, unspecified: Secondary | ICD-10-CM

## 2012-01-18 NOTE — Progress Notes (Signed)
   THERAPIST PROGRESS NOTE  Session Time: 4:00pm-4:50pm  Participation Level: Active  Behavioral Response: Fairly GroomedAlertDepressed and Hopeless  Type of Therapy: Individual Therapy  Treatment Goals addressed: Coping  Interventions: CBT, Motivational Interviewing and Strength-based  Summary: DIAMANTINA EDINGER is a 51 y.o. female who presents with depressed mood and flat affect. She reports continued depression, crying spells, feelings of hopelessness and helplessness about her life. She does not want to interact or talk to her family and is bothered by her daughters insistence to tell her what to do in order to be less depressed. She does not say anything back to her because she is afraid her daughter will punish her and not allow her grandson to visit her. She has asked her husband to leave and was hopeful when he talked to their daughter about moving in with her, but has since dropped the issue and patient feels disappointed as she believes that he will not follow through. She is forcing herself to shower every other day, but doesn't want to. She is eating poorly and doesn't care about her blood sugar levels, which are running high. She has been unable to afford her insulin and does not see the point in taking it if she can't afford to take it all the time. Her sleep is poor, with frequent disruption.    Suicidal/Homicidal: Nowithout intent/plan  Therapist Response: Reviewed patients progress and assessed current functioning. Assessed any risk to self and explored passive suicidality related to refusing to care for her diabetes. Patient is not at risk for suicide, is able to talk about protective factors, such as her grandson and her dog. Processed her pattern of poor self care when she becomes depressed and educated patient on the negative consequences of these behaviors. Used motivational interviewing to assist and encourage patient through the change process. Explored patients barriers to  change. Her thinking is negative and distorted towards self. Used CBT to assist patient with the identification of her negative distortions and irrational thoughts. Encouraged patient to verbalize alternative and factual responses which challenge her distortions. Recommend patient contact her doctor to ask for samples of insulin. She is making progress related to walking her dog daily and spending time outside. Reviewed and updated her treatment plan, to focus on leaving her husband and management of her depression.   Plan: Return again in one to two weeks.  Diagnosis: Axis I: Bipolar, Depressed    Axis II: No diagnosis    Suhailah Kwan, LCSW 01/18/2012

## 2012-02-07 ENCOUNTER — Ambulatory Visit (INDEPENDENT_AMBULATORY_CARE_PROVIDER_SITE_OTHER): Payer: Medicare Other | Admitting: Licensed Clinical Social Worker

## 2012-02-07 ENCOUNTER — Encounter (HOSPITAL_COMMUNITY): Payer: Self-pay | Admitting: Licensed Clinical Social Worker

## 2012-02-07 DIAGNOSIS — F319 Bipolar disorder, unspecified: Secondary | ICD-10-CM

## 2012-02-07 DIAGNOSIS — F3130 Bipolar disorder, current episode depressed, mild or moderate severity, unspecified: Secondary | ICD-10-CM
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
## 2012-02-14 ENCOUNTER — Encounter (HOSPITAL_COMMUNITY): Payer: Self-pay | Admitting: Licensed Clinical Social Worker

## 2012-02-14 ENCOUNTER — Ambulatory Visit (INDEPENDENT_AMBULATORY_CARE_PROVIDER_SITE_OTHER): Payer: Medicare Other | Admitting: Licensed Clinical Social Worker

## 2012-02-14 DIAGNOSIS — F3130 Bipolar disorder, current episode depressed, mild or moderate severity, unspecified: Secondary | ICD-10-CM

## 2012-02-14 DIAGNOSIS — F319 Bipolar disorder, unspecified: Secondary | ICD-10-CM

## 2012-02-14 NOTE — Progress Notes (Signed)
   THERAPIST PROGRESS NOTE  Session Time: 3:00pm-3:50pm  Participation Level: Active  Behavioral Response: Fairly GroomedAlertDepressed and Hopeless  Type of Therapy: Individual Therapy  Treatment Goals addressed: Coping  Interventions: CBT, Motivational Interviewing, Supportive and Reframing  Summary: [name] is a 51 y.o. female who presents with depressed mood and flat affect. She reports slight improvement in her depression and motivation. She is making an effort to get out of bed more and has been reading. She is not eating well and she knows this is contributing to high blood sugar levels. She feels hopeless about her marriage situation and knows she can't "love the addiction to alcohol out of [place]". She has been attending Al-Anon and is angry that she has to attend this, but her husband won't seek sobriety. She does not want to go to the beach with her family next week and is only going so her grandson will have a good time. She is concerned because when she tells her family that she does not want to participate in an activity, they blame her for ruining their fun and she feels like the bad guy. She endorses feelings of hopelessness about her life and does not believe that her life can improve. She does express a desire to leave her husband, but finds this financially impossible to do. Her sleep continues to be increased and her appetite is wnl, but poor choices to control her blood sugar.    Suicidal/Homicidal: Nowithout intent/plan  Therapist Response: Reviewed patients progress and assessed her current functioning. Assessed her safety and hopelessness. She denies any thoughts, intent or plan to harm herself and states that her grandson and daughters are protective factors for her. Her thinking is negative, irrational and distorted. Used CBT to assist patient with the identification of her negative distortions and irrational thoughts. Encouraged patient to verbalize alternative  and factual responses which challenge her distortions. Used motivational interviewing to assist and encourage patient through the change process. Explored patients barriers to change. Reviewed strategies to manage her depression and problem solved about how she can communicate her boundaries to her family while on vacation. Reviewed patients self care plan. Assessed her progress related to self care. Patient's self care is poor. Recommend proper diet, regular exercise, socialization and recreation.   Plan: Return again in two weeks.  Diagnosis: Axis I: Bipolar, Depressed    Axis II: No diagnosis    Colleen Kotlarz, LCSW 02/14/2012

## 2012-02-28 ENCOUNTER — Ambulatory Visit (INDEPENDENT_AMBULATORY_CARE_PROVIDER_SITE_OTHER): Payer: Medicare Other | Admitting: Physician Assistant

## 2012-02-28 ENCOUNTER — Other Ambulatory Visit (HOSPITAL_COMMUNITY): Payer: Self-pay | Admitting: *Deleted

## 2012-02-28 DIAGNOSIS — F319 Bipolar disorder, unspecified: Secondary | ICD-10-CM

## 2012-02-28 DIAGNOSIS — F3130 Bipolar disorder, current episode depressed, mild or moderate severity, unspecified: Secondary | ICD-10-CM

## 2012-02-28 DIAGNOSIS — F3132 Bipolar disorder, current episode depressed, moderate: Secondary | ICD-10-CM

## 2012-02-28 MED ORDER — RISPERIDONE 1 MG PO TABS: 1.0000 mg | ORAL_TABLET | Freq: Every day | ORAL | Status: DC

## 2012-02-28 MED ORDER — TRAZODONE HCL 100 MG PO TABS: 100.0000 mg | ORAL_TABLET | Freq: Every day | ORAL | Status: DC

## 2012-02-28 MED ORDER — BUPROPION HCL ER (SR) 100 MG PO TB12: 100.0000 mg | ORAL_TABLET | Freq: Two times a day (BID) | ORAL | Status: DC

## 2012-02-28 MED ORDER — THIOTHIXENE 5 MG PO CAPS: 5.0000 mg | ORAL_CAPSULE | Freq: Every day | ORAL | Status: DC

## 2012-02-28 NOTE — Telephone Encounter (Signed)
Per A.Watt PA: Refill Thiothixene for 30 day supply w/1 refill. Do not refill Mirtazepine has been discontinued

## 2012-02-28 NOTE — Progress Notes (Signed)
   [facility] Behavioral Health Follow-up Outpatient Visit  [name] 1961-07-07  Date: 02/28/2012   Subjective: [name] presents today to followup on medications prescribed for Alvarado bipolar disorder. She reports that she has been depressed for approximately one month. She states that Alvarado sleep is poor in that it takes about 45 minutes to fall asleep then she wakes up after an hour, she will be awake for an hour then follow sleep again, repeating this pattern through the night. She reports that Alvarado energy has been low. Alvarado appetite has increased and she is not always eating healthy. Alvarado tremor has significantly decreased, and she reports she is able to write now. She asked that she be taken off the medication that causes Alvarado to eat. She does report that she is trying to be active and states that she takes Alvarado dog for a 30 minute walk daily, she tries to get outside in the sunlight more frequently, and she has been helping with the food drive at church in addition to attending church twice weekly. She denies any suicidal or homicidal ideation. She denies any auditory or visual hallucinations.  There were no vitals filed for this visit.  Mental Status Examination  Appearance: Fairly groomed and casually dressed Alert: Yes Attention: good  Cooperative: Yes Eye Contact: Good Speech: Clear and coherent Psychomotor Activity: Normal Memory/Concentration: Intact Oriented: person, place, time/date and situation Mood: Dysphoric Affect: Blunt Thought Processes and Associations: Logical Fund of Knowledge: Good Thought Content: Normal Insight: Fair Judgement: Good  Diagnosis: Bipolar disorder, most recent episode depressed  Treatment Plan: We will replace Alvarado Remeron with trazodone 100 mg at bedtime, and add Wellbutrin SR 100 mg each morning. She will continue lithium 300 mg once daily, Lamictal 400 mg daily, Risperdal 1 mg at bedtime, and Navane 5 mg at bedtime. She will return for followup  in approximately one month.  Jeris Roser, PA-C

## 2012-03-01 ENCOUNTER — Other Ambulatory Visit (HOSPITAL_COMMUNITY): Payer: Self-pay | Admitting: *Deleted

## 2012-03-08 ENCOUNTER — Ambulatory Visit (HOSPITAL_COMMUNITY): Payer: Medicare Other | Admitting: Licensed Clinical Social Worker

## 2012-03-08 DIAGNOSIS — F3130 Bipolar disorder, current episode depressed, mild or moderate severity, unspecified: Secondary | ICD-10-CM

## 2012-03-08 DIAGNOSIS — F319 Bipolar disorder, unspecified: Secondary | ICD-10-CM

## 2012-03-08 NOTE — Progress Notes (Signed)
   THERAPIST PROGRESS NOTE  Session Time: 2:00pm-2:50pm  Participation Level: Active  Behavioral Response: Fairly GroomedDrowsyDepressed  Type of Therapy: Individual Therapy  Treatment Goals addressed: Coping  Interventions: CBT, Motivational Interviewing, Supportive and Reframing  Summary: [name] is a 51 y.o. female who presents with depressed mood and flat affect. She reports improvement in her depression, but is concerned because she is extremely fatigued. She is not motivated to do anything and some days she spends the entire day in bed. She is upset with her family and how her husband and son in law interact with her 47 year old grandson. She describes her anger as she watches them talk to her grandson about women and sex. She has not said anything because her daughter gets upset with her when she does. She has stopped attending Al Anon meetings out of anger that she is seeking help instead of her husband. She is trying to spend time reading her bible for activity. Her sleep is increased and her appetite is wnl.   Suicidal/Homicidal: Nowithout intent/plan  Therapist Response: Reviewed patients progress and assessed her current functioning. Processed w/pt her anger about her grandson. Challenged her avoidance of conflict. Patient demonstrates poor motivation for change. Used motivational interviewing to assist and encourage patient through the change process. Explored patients barriers to change. Her thinking is negative and distorted and focused on learned helplessness. Used CBT to assist patient with the identification of her negative distortions and irrational thoughts. Encouraged patient to verbalize alternative and factual responses which challenge her distortions. Recommend patient attend Al Anon. Reviewed patients self care plan. Assessed her progress related to self care. Patient's self care is poor. Recommend proper diet, regular exercise, socialization and recreation.    Plan: Return again in three weeks.  Diagnosis: Axis I: Bipolar, Depressed    Axis II: No diagnosis    Rodnisha Blomgren, LCSW 03/08/2012

## 2012-03-27 ENCOUNTER — Encounter (HOSPITAL_COMMUNITY): Payer: Self-pay | Admitting: Licensed Clinical Social Worker

## 2012-03-27 ENCOUNTER — Ambulatory Visit (INDEPENDENT_AMBULATORY_CARE_PROVIDER_SITE_OTHER): Payer: Medicare Other | Admitting: Licensed Clinical Social Worker

## 2012-03-27 DIAGNOSIS — F3130 Bipolar disorder, current episode depressed, mild or moderate severity, unspecified: Secondary | ICD-10-CM

## 2012-03-27 DIAGNOSIS — F319 Bipolar disorder, unspecified: Secondary | ICD-10-CM
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
## 2012-04-03 ENCOUNTER — Other Ambulatory Visit (HOSPITAL_COMMUNITY): Payer: Self-pay | Admitting: *Deleted

## 2012-04-04 ENCOUNTER — Other Ambulatory Visit (HOSPITAL_COMMUNITY): Payer: Self-pay | Admitting: *Deleted

## 2012-04-04 DIAGNOSIS — F3132 Bipolar disorder, current episode depressed, moderate: Secondary | ICD-10-CM

## 2012-04-04 MED ORDER — BUPROPION HCL ER (SR) 100 MG PO TB12: 100.0000 mg | ORAL_TABLET | Freq: Every day | ORAL | Status: DC

## 2012-04-17 ENCOUNTER — Ambulatory Visit (INDEPENDENT_AMBULATORY_CARE_PROVIDER_SITE_OTHER): Payer: MEDICARE | Admitting: Physician Assistant

## 2012-04-17 DIAGNOSIS — F3132 Bipolar disorder, current episode depressed, moderate: Secondary | ICD-10-CM

## 2012-04-17 MED ORDER — BUPROPION HCL ER (SR) 100 MG PO TB12: 100.0000 mg | ORAL_TABLET | Freq: Two times a day (BID) | ORAL | Status: DC

## 2012-04-17 NOTE — Progress Notes (Signed)
   [facility] Behavioral Health Follow-up Outpatient Visit  [name] Mar 09, 1961  Date: 04/17/2012   Subjective: [name] presents today to followup on her treatment for bipolar disorder. At her last visit we started her on trazodone and Wellbutrin. She feels that she is sleeping better with the trazodone, and she denies any side effects. She endorses that she wakes about twice per night, but has no problem falling back to sleep. She gets a total of 6 hours per night, which she feels is sufficient. She reports that her mood has improved since being on the Wellbutrin. She feels that she is more stable, and feels less depressed. She takes the Wellbutrin twice daily at 9 AM and 7 PM although it was prescribed only once daily, apparently her bottle states take twice daily. She reports that her tremor his nearly completely resolved. She states that her appetite is good. She continues to walk the dog daily. She is still involved at church, and is now helping with [employer] this week. She denies any auditory or visual hallucinations. She denies any suicidal or homicidal ideation.  There were no vitals filed for this visit.  Mental Status Examination  Appearance: Fairly groomed and casually dressed Alert: Yes Attention: good  Cooperative: Yes Eye Contact: Good Speech: Clear and coherent Psychomotor Activity: Normal Memory/Concentration: Intact Oriented: person, place, time/date and situation Mood: Dysphoric Affect: Appropriate Thought Processes and Associations: Linear Fund of Knowledge: Good Thought Content: Normal Insight: Good Judgement: Good  Diagnosis: Bipolar disorder, most recent episode depressed  Treatment Plan: We will continue her trazodone at 50 mg nightly, and change her Wellbutrin SR to 100 mg twice daily to be taken at 9 AM and 2 PM. She will return for followup in 6 weeks.  Cola Highfill, PA-C

## 2012-04-18 ENCOUNTER — Ambulatory Visit (INDEPENDENT_AMBULATORY_CARE_PROVIDER_SITE_OTHER): Payer: Medicare Other | Admitting: Licensed Clinical Social Worker

## 2012-04-18 DIAGNOSIS — F319 Bipolar disorder, unspecified: Secondary | ICD-10-CM

## 2012-04-18 DIAGNOSIS — F3130 Bipolar disorder, current episode depressed, mild or moderate severity, unspecified: Secondary | ICD-10-CM
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
## 2012-04-20 ENCOUNTER — Other Ambulatory Visit (HOSPITAL_COMMUNITY): Payer: Self-pay | Admitting: Physician Assistant

## 2012-05-16 ENCOUNTER — Ambulatory Visit (HOSPITAL_COMMUNITY): Payer: Self-pay | Admitting: Licensed Clinical Social Worker

## 2012-05-23 ENCOUNTER — Ambulatory Visit (INDEPENDENT_AMBULATORY_CARE_PROVIDER_SITE_OTHER): Payer: No Typology Code available for payment source | Admitting: Licensed Clinical Social Worker

## 2012-05-23 ENCOUNTER — Encounter (HOSPITAL_COMMUNITY): Payer: Self-pay | Admitting: Licensed Clinical Social Worker

## 2012-05-23 ENCOUNTER — Other Ambulatory Visit (HOSPITAL_COMMUNITY): Payer: Self-pay | Admitting: Physician Assistant

## 2012-05-23 DIAGNOSIS — F319 Bipolar disorder, unspecified: Secondary | ICD-10-CM

## 2012-05-23 DIAGNOSIS — F3130 Bipolar disorder, current episode depressed, mild or moderate severity, unspecified: Secondary | ICD-10-CM
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
## 2012-05-24 ENCOUNTER — Other Ambulatory Visit (HOSPITAL_COMMUNITY): Payer: Self-pay | Admitting: *Deleted

## 2012-05-24 DIAGNOSIS — F3132 Bipolar disorder, current episode depressed, moderate: Secondary | ICD-10-CM

## 2012-05-24 MED ORDER — BUPROPION HCL ER (SR) 100 MG PO TB12: 100.0000 mg | ORAL_TABLET | Freq: Two times a day (BID) | ORAL | Status: DC

## 2012-05-24 NOTE — Telephone Encounter (Signed)
Per Chart, 7/22, pt given RX for Bupropion Sr 30 day supply with one refill. Per [facility], RX from 7/22 does not have a refill on it. Refill authorized/

## 2012-06-19 ENCOUNTER — Ambulatory Visit (INDEPENDENT_AMBULATORY_CARE_PROVIDER_SITE_OTHER): Payer: Self-pay | Admitting: Physician Assistant

## 2012-06-19 DIAGNOSIS — F319 Bipolar disorder, unspecified: Secondary | ICD-10-CM

## 2012-06-19 DIAGNOSIS — F3130 Bipolar disorder, current episode depressed, mild or moderate severity, unspecified: Secondary | ICD-10-CM

## 2012-06-19 NOTE — Progress Notes (Signed)
   [facility] Behavioral Health Follow-up Outpatient Visit  [name] [birth year]  Date: 06/19/2012   Subjective: [name] presents today to followup on her treatment for bipolar disorder. She complains of having about 2 hour of sleep latency. She reports that once she falls asleep she stays asleep through the night. She has been taking her Wellbutrin at 8 AM and 7 PM, and takes her trazodone around 7 PM, but then doesn't go to bed until 9 PM. She reports that her mood has been stable, and that she feels "a lot better" than she had. Her appetite is good, but her blood sugars have been in the 200 range. She continues to be involved with her church, and she continues to walk her dog daily. She denies any suicidal or homicidal ideation. She denies any auditory or visual hallucinations.  There were no vitals filed for this visit.  Mental Status Examination  Appearance: Fairly groomed and casually dressed Alert: Yes Attention: good  Cooperative: Yes Eye Contact: Good Speech: Clear and coherent Psychomotor Activity: Decreased Memory/Concentration: Intact Oriented: person, place, time/date and situation Mood: Dysphoric Affect: Congruent Thought Processes and Associations: Linear Fund of Knowledge: Good Thought Content: Normal Insight: Fair Judgement: Good  Diagnosis: Bipolar disorder, currently depressed  Treatment Plan: We will again ask her to take her Wellbutrin in the morning and around 2 PM, and take her trazodone approximately 15 minutes before going to bed. We will continue her lithium at 100 mg daily, Lamictal 400 mg daily, Risperdal 1 mg at bedtime, and not vein 5 mg at bedtime. She will return for followup in 2 months.  Journee Kohen, PA-C

## 2012-06-22 ENCOUNTER — Other Ambulatory Visit (HOSPITAL_COMMUNITY): Payer: Self-pay | Admitting: *Deleted

## 2012-06-22 DIAGNOSIS — F3132 Bipolar disorder, current episode depressed, moderate: Secondary | ICD-10-CM

## 2012-06-22 MED ORDER — BUPROPION HCL ER (SR) 100 MG PO TB12: 100.0000 mg | ORAL_TABLET | Freq: Two times a day (BID) | ORAL | Status: DC

## 2012-06-30 ENCOUNTER — Other Ambulatory Visit (HOSPITAL_COMMUNITY): Payer: Self-pay | Admitting: Physician Assistant

## 2012-06-30 DIAGNOSIS — F319 Bipolar disorder, unspecified: Secondary | ICD-10-CM

## 2012-07-04 ENCOUNTER — Encounter (HOSPITAL_COMMUNITY): Payer: Self-pay | Admitting: Licensed Clinical Social Worker

## 2012-07-04 ENCOUNTER — Ambulatory Visit (INDEPENDENT_AMBULATORY_CARE_PROVIDER_SITE_OTHER): Payer: MEDICARE | Admitting: Licensed Clinical Social Worker

## 2012-07-04 DIAGNOSIS — F319 Bipolar disorder, unspecified: Secondary | ICD-10-CM

## 2012-07-04 DIAGNOSIS — F3130 Bipolar disorder, current episode depressed, mild or moderate severity, unspecified: Secondary | ICD-10-CM
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
## 2012-07-25 ENCOUNTER — Encounter (HOSPITAL_COMMUNITY): Payer: Self-pay | Admitting: Licensed Clinical Social Worker

## 2012-07-25 ENCOUNTER — Ambulatory Visit (INDEPENDENT_AMBULATORY_CARE_PROVIDER_SITE_OTHER): Payer: No Typology Code available for payment source | Admitting: Licensed Clinical Social Worker

## 2012-07-25 DIAGNOSIS — F3130 Bipolar disorder, current episode depressed, mild or moderate severity, unspecified: Secondary | ICD-10-CM

## 2012-07-25 DIAGNOSIS — F319 Bipolar disorder, unspecified: Secondary | ICD-10-CM
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
## 2012-07-27 ENCOUNTER — Ambulatory Visit (INDEPENDENT_AMBULATORY_CARE_PROVIDER_SITE_OTHER): Payer: MEDICARE | Admitting: Physician Assistant

## 2012-07-27 DIAGNOSIS — F319 Bipolar disorder, unspecified: Secondary | ICD-10-CM

## 2012-07-27 DIAGNOSIS — F3130 Bipolar disorder, current episode depressed, mild or moderate severity, unspecified: Secondary | ICD-10-CM

## 2012-07-27 MED ORDER — BUPROPION HCL ER (XL) 150 MG PO TB24: 150.0000 mg | ORAL_TABLET | Freq: Every day | ORAL | Status: DC

## 2012-07-27 MED ORDER — ZOLPIDEM TARTRATE 10 MG PO TABS: 10.0000 mg | ORAL_TABLET | Freq: Every evening | ORAL | Status: DC | PRN
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
## 2012-07-31 ENCOUNTER — Telehealth (HOSPITAL_COMMUNITY): Payer: Self-pay | Admitting: Licensed Clinical Social Worker

## 2012-07-31 ENCOUNTER — Telehealth (HOSPITAL_COMMUNITY): Payer: Self-pay
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
## 2012-08-07 ENCOUNTER — Ambulatory Visit (INDEPENDENT_AMBULATORY_CARE_PROVIDER_SITE_OTHER): Payer: MEDICARE | Admitting: Physician Assistant

## 2012-08-07 DIAGNOSIS — F3130 Bipolar disorder, current episode depressed, mild or moderate severity, unspecified: Secondary | ICD-10-CM

## 2012-08-07 DIAGNOSIS — F319 Bipolar disorder, unspecified: Secondary | ICD-10-CM

## 2012-08-07 MED ORDER — DIVALPROEX SODIUM ER 250 MG PO TB24: ORAL_TABLET | ORAL | Status: DC

## 2012-08-07 NOTE — Progress Notes (Signed)
   [facility] Behavioral Health Follow-up Outpatient Visit  [name] 1961-02-21  Date: 08/07/2012   Subjective: Shantell presents today to followup on her treatment for bipolar disorder. At her last appointment she complained of an inability to sleep, so we prescribed Ambien to be taken 10 mg at bedtime. She admits to overusing the Ambien, and was unable to get but 2 nights of sleep since her last appointment. She complains of racing thoughts, and states, "it's real noisy in my head." She feels that the Wellbutrin may have helped her depression slightly. She denies any auditory or visual hallucinations. She denies any suicidal or homicidal ideation.  There were no vitals filed for this visit.  Mental Status Examination  Appearance: Fairly groomed and casually dressed Alert: Yes Attention: good  Cooperative: Yes Eye Contact: Good Speech: Clear and coherent Psychomotor Activity: Psychomotor Retardation Memory/Concentration: Intact Oriented: person, place, time/date and situation Mood: Depressed Affect: Flat Thought Processes and Associations: Logical Fund of Knowledge: Fair Thought Content: Normal Insight: Fair Judgement: Fair  Diagnosis: Bipolar disorder, currently depressed  Treatment Plan:  We will discontinue the Ambien due to abuse of that substance. We will start her on Depakote ER 250 mg for one week, then increase to 500 mg at bedtime. During the transition, we will decrease her Lamictal to 200 mg daily for 2 weeks, then razorback to 400 mg daily. We will continue her Wellbutrin XL 150 mg daily, Lithobid 300 mg daily, Risperdal 1 mg at bedtime, Navane 5 mg at bedtime, and trazodone 100 mg at bedtime. She will return for followup at my next available appointment in approximately 2 months.   Nyelle Wolfson, PA-C

## 2012-08-08 ENCOUNTER — Encounter (HOSPITAL_COMMUNITY): Payer: Self-pay | Admitting: Licensed Clinical Social Worker

## 2012-08-08 ENCOUNTER — Ambulatory Visit (INDEPENDENT_AMBULATORY_CARE_PROVIDER_SITE_OTHER): Payer: No Typology Code available for payment source | Admitting: Licensed Clinical Social Worker

## 2012-08-08 DIAGNOSIS — F3130 Bipolar disorder, current episode depressed, mild or moderate severity, unspecified: Secondary | ICD-10-CM

## 2012-08-08 DIAGNOSIS — F319 Bipolar disorder, unspecified: Secondary | ICD-10-CM
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
## 2012-08-17 ENCOUNTER — Ambulatory Visit (HOSPITAL_COMMUNITY): Payer: Self-pay | Admitting: Physician Assistant

## 2012-08-21 ENCOUNTER — Ambulatory Visit (HOSPITAL_COMMUNITY): Payer: Self-pay | Admitting: Physician Assistant

## 2012-08-28 ENCOUNTER — Encounter (HOSPITAL_COMMUNITY): Payer: Self-pay | Admitting: Licensed Clinical Social Worker

## 2012-08-28 ENCOUNTER — Ambulatory Visit (INDEPENDENT_AMBULATORY_CARE_PROVIDER_SITE_OTHER): Payer: MEDICARE | Admitting: Licensed Clinical Social Worker

## 2012-08-28 DIAGNOSIS — F3130 Bipolar disorder, current episode depressed, mild or moderate severity, unspecified: Secondary | ICD-10-CM

## 2012-08-28 DIAGNOSIS — F319 Bipolar disorder, unspecified: Secondary | ICD-10-CM
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
## 2012-09-15 ENCOUNTER — Other Ambulatory Visit (HOSPITAL_COMMUNITY): Payer: Self-pay | Admitting: Physician Assistant

## 2012-09-15 ENCOUNTER — Telehealth (HOSPITAL_COMMUNITY): Payer: Self-pay

## 2012-09-15 MED ORDER — DIVALPROEX SODIUM ER 500 MG PO TB24: 500.0000 mg | ORAL_TABLET | Freq: Every day | ORAL | Status: DC

## 2012-09-15 MED ORDER — BUPROPION HCL ER (XL) 150 MG PO TB24: 150.0000 mg | ORAL_TABLET | Freq: Every day | ORAL | Status: DC

## 2012-09-22 ENCOUNTER — Other Ambulatory Visit (HOSPITAL_COMMUNITY): Payer: Self-pay | Admitting: Physician Assistant

## 2012-09-22 ENCOUNTER — Ambulatory Visit (INDEPENDENT_AMBULATORY_CARE_PROVIDER_SITE_OTHER): Payer: MEDICARE | Admitting: Licensed Clinical Social Worker

## 2012-09-22 DIAGNOSIS — F319 Bipolar disorder, unspecified: Secondary | ICD-10-CM

## 2012-09-22 DIAGNOSIS — F3130 Bipolar disorder, current episode depressed, mild or moderate severity, unspecified: Secondary | ICD-10-CM
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
## 2012-10-11 ENCOUNTER — Ambulatory Visit (INDEPENDENT_AMBULATORY_CARE_PROVIDER_SITE_OTHER): Payer: MEDICARE | Admitting: Physician Assistant

## 2012-10-11 DIAGNOSIS — F319 Bipolar disorder, unspecified: Secondary | ICD-10-CM

## 2012-10-11 MED ORDER — DIVALPROEX SODIUM ER 500 MG PO TB24: 1000.0000 mg | ORAL_TABLET | Freq: Every day | ORAL | Status: DC

## 2012-10-11 NOTE — Progress Notes (Signed)
   [facility] Behavioral Health Follow-up Outpatient Visit  DAMARIS ABELN 1961/04/28  Date: 10/11/2012   Subjective: Ellison presents today to followup on her treatment for bipolar disorder. At her last appointment we started her on Depakote ER and titrated that up to 500 mg daily at bedtime. She continues to complain of poor sleep, and states that she gets only about 3 hours per night. She also expresses that she continues to be rather irritable and agitated at times. She also reports that she is having daily headaches which occur in her for head area. She feels that her racing thoughts have diminished somewhat. She denies any suicidal or homicidal ideation. She denies any auditory or visual hallucinations. She reports that she is mildly depressed at times.  There were no vitals filed for this visit.  Mental Status Examination  Appearance: Casual Alert: Yes Attention: good  Cooperative: Yes Eye Contact: Good Speech: Clear and coherent Psychomotor Activity: Psychomotor Retardation Memory/Concentration: Intact Oriented: person, place, time/date and situation Mood: Depressed Affect: Flat Thought Processes and Associations: Linear Fund of Knowledge: Good Thought Content: Normal Insight: Fair Judgement: Good  Diagnosis: Bipolar disorder not otherwise specified  Treatment Plan: We will increase her Depakote to 1000 mg at bedtime. We will continue her Wellbutrin XL 150 mg daily, Lamictal 400 mg daily, Lithobid 300 mg daily, Risperdal 1 mg at bedtime, Navane 5 mg at bedtime, and trazodone 100 mg at bedtime. She will return for followup in 2 months. As she goes to see her endocrinologist later this month, we will have a Depakote level checked at that visit. We have asked that those labs be faxed to this office for our review.  Laila Myhre, PA-C

## 2012-10-23 ENCOUNTER — Ambulatory Visit (INDEPENDENT_AMBULATORY_CARE_PROVIDER_SITE_OTHER): Payer: MEDICARE | Admitting: Licensed Clinical Social Worker

## 2012-10-23 DIAGNOSIS — F3130 Bipolar disorder, current episode depressed, mild or moderate severity, unspecified: Secondary | ICD-10-CM

## 2012-10-23 DIAGNOSIS — F319 Bipolar disorder, unspecified: Secondary | ICD-10-CM

## 2012-10-23 NOTE — Progress Notes (Signed)
   THERAPIST PROGRESS NOTE  Session Time: 3:00pm-3:50pm  Participation Level: Active  Behavioral Response: Fairly GroomedAlertEuthymic  Type of Therapy: Individual Therapy  Treatment Goals addressed: Anxiety and Coping  Interventions: CBT, DBT, Motivational Interviewing, Strength-based, Supportive and Reframing  Summary: [name] is a 52 y.o. female who presents with euthymic mood and affect. She reports improvement in her mood lability since her last session. She is pleased that her husband has cut back on his drinking lately, but she understands this is temporary due to finances. She is bothered by hand tremors but is willing to accept this side effect. She set a boundary with her daughter and is pleased that she was able to do this without negative consequences. Her isolation has decreased lately and she has attended a church conference. Her health remains poor and her blood sugar levels are high. Her sleep and appetite are wnl.   Suicidal/Homicidal: Nowithout intent/plan  Therapist Response: Assessed patients current functioning and reviewed progress. Reviewed coping strategies. Assessed patients safety and assisted in identifying protective factors.  Reviewed crisis plan with patient. Assisted patient with the expression of her feelings of frustration with her husband. Used CBT to assist patient with the identification of negative distortions and irrational thoughts. Encouraged patient to verbalize alternative and factual responses which challenge thought distortions. Used motivational interviewing to assist and encourage patient through the change process. Explored patients barriers to change. Reviewed patients self care plan. Assessed  progress related to self care. Patient's self care is improving. Recommend proper diet, regular exercise, socialization and recreation.   Plan: Return again in four weeks.  Diagnosis: Axis I: bi polar    Axis II: No  diagnosis    Vance Hochmuth, LCSW 10/23/2012

## 2012-11-21 ENCOUNTER — Other Ambulatory Visit (HOSPITAL_COMMUNITY): Payer: Self-pay | Admitting: Physician Assistant

## 2012-11-21 ENCOUNTER — Ambulatory Visit (INDEPENDENT_AMBULATORY_CARE_PROVIDER_SITE_OTHER): Payer: MEDICARE | Admitting: Licensed Clinical Social Worker

## 2012-11-21 DIAGNOSIS — F3130 Bipolar disorder, current episode depressed, mild or moderate severity, unspecified: Secondary | ICD-10-CM

## 2012-11-21 DIAGNOSIS — F319 Bipolar disorder, unspecified: Secondary | ICD-10-CM

## 2012-11-21 NOTE — Telephone Encounter (Signed)
Called patient to address concerns brought to this provider's attention by patient's counselor, Geanie Berlin, that patient is experiencing auditory and visual hallucinations, as well as paranoia. Instructed patient to increase his Risperdal dose from 1 mg at bedtime to 2 mg. If improvement is not seen in 3-5 days patient is instructed to call back for further instructions for medication changes.

## 2012-11-21 NOTE — Progress Notes (Signed)
   THERAPIST PROGRESS NOTE  Session Time: 3:00pm-3:50pm  Participation Level: Active  Behavioral Response: DisheveledLethargicAnxious, Depressed and Hopeless  Type of Therapy: Individual Therapy  Treatment Goals addressed: Coping  Interventions: CBT, Motivational Interviewing, Strength-based, Supportive and Reframing  Summary: [name] is a 52 y.o. female who presents with depressed mood and flat affect. She reports AH of hearing people walk on the roof; VH of seeing rats all over the house and endorses increased paranoia. Her stress level has significantly increased at home. Her husband is out of work and they have no money to pay bills. There is no heat in the home and groceries are sparce. She is fearful of what will happen to them because they can't pay bills. She endorses "giving up" and that she doesn't care because caring is too hard. She is interacting with her family, spending time with her grandson and plans to walk tonight with her daughters. She endorses hopelessness and helplessness. Her sleep is poor. Her appetite is wnl.   Suicidal/Homicidal: Nowithout intent/plan  Therapist Response: Assessed patients current functioning and reviewed progress. Reviewed coping strategies. Assessed patients safety and assisted in identifying protective factors.  Reviewed crisis plan with patient. Assisted patient with the expression of her feelings of depression, anxiety and frustration. Used CBT to assist patient with the identification of negative distortions and irrational thoughts. Encouraged patient to verbalize alternative and factual responses which challenge thought distortions. Discussed degree of distress related to her current psychosis and patient does not feel she needs to be hospitalized. She does not meet criteria for hospitalization. Used DBT to practice mindfulness, review distraction list and improve distress tolerance skills. Used motivational interviewing to assist and  encourage patient through the change process. Explored patients barriers to change. Reviewed patients self care plan. Assessed  progress related to self care. Patient's self care is poor. Recommend proper diet, regular exercise, socialization and recreation. Contacted Jorje Guild, [place] regarding patients psychosis, requesting medication consult.   Plan: Return again in six weeks due to finances.   Diagnosis: Axis I: Bipolar, Depressed    Axis II: No diagnosis    Shylynn Bruning, LCSW 11/21/2012

## 2012-11-22 ENCOUNTER — Other Ambulatory Visit (HOSPITAL_COMMUNITY): Payer: Self-pay | Admitting: Psychiatry

## 2012-12-06 ENCOUNTER — Other Ambulatory Visit (HOSPITAL_COMMUNITY): Payer: Self-pay | Admitting: *Deleted

## 2012-12-07 ENCOUNTER — Other Ambulatory Visit (HOSPITAL_COMMUNITY): Payer: Self-pay | Admitting: *Deleted

## 2012-12-07 DIAGNOSIS — F319 Bipolar disorder, unspecified: Secondary | ICD-10-CM

## 2012-12-07 MED ORDER — TRAZODONE HCL 100 MG PO TABS: ORAL_TABLET | ORAL | Status: DC

## 2012-12-07 MED ORDER — DIVALPROEX SODIUM ER 500 MG PO TB24: 1000.0000 mg | ORAL_TABLET | Freq: Every day | ORAL | Status: DC

## 2012-12-07 MED ORDER — LAMOTRIGINE 200 MG PO TABS: ORAL_TABLET | ORAL | Status: DC

## 2012-12-07 MED ORDER — BUPROPION HCL ER (XL) 150 MG PO TB24: 150.0000 mg | ORAL_TABLET | Freq: Every day | ORAL | Status: DC

## 2012-12-07 MED ORDER — RISPERIDONE 1 MG PO TABS: ORAL_TABLET | ORAL | Status: DC

## 2012-12-07 MED ORDER — LITHIUM CARBONATE ER 300 MG PO TBCR: 300.0000 mg | EXTENDED_RELEASE_TABLET | Freq: Every day | ORAL | Status: DC

## 2012-12-19 ENCOUNTER — Ambulatory Visit (INDEPENDENT_AMBULATORY_CARE_PROVIDER_SITE_OTHER): Payer: MEDICARE | Admitting: Physician Assistant

## 2012-12-19 DIAGNOSIS — F319 Bipolar disorder, unspecified: Secondary | ICD-10-CM

## 2012-12-19 DIAGNOSIS — F3130 Bipolar disorder, current episode depressed, mild or moderate severity, unspecified: Secondary | ICD-10-CM

## 2012-12-19 MED ORDER — BUPROPION HCL ER (XL) 300 MG PO TB24: 300.0000 mg | ORAL_TABLET | Freq: Every day | ORAL | Status: DC

## 2012-12-19 MED ORDER — RISPERIDONE 3 MG PO TABS: ORAL_TABLET | ORAL | Status: DC

## 2012-12-19 MED ORDER — DIVALPROEX SODIUM ER 500 MG PO TB24: 1500.0000 mg | ORAL_TABLET | Freq: Every day | ORAL | Status: DC

## 2012-12-19 MED ORDER — TRAZODONE HCL 150 MG PO TABS: ORAL_TABLET | ORAL | Status: DC

## 2012-12-21 NOTE — Progress Notes (Signed)
   [facility] Behavioral Health Follow-up Outpatient Visit  [name] [birth year]  Date: 12/19/2012   Subjective: [name] presents today to followup on her treatment for bipolar disorder. At her last visit in mid January, we increased her Depakote to 1000 mg at bedtime, and continued her other medications as previously prescribed. She reports that she continues to have racing thoughts, but they have decreased slightly. She also endorses that she wakes at 4:30 every morning. She endorses that she has a significant amount of depression, and rates it as a 9 on a scale of 1-10 where 10 is the worst. She reports that her energy is low, and she has not been exercising as much do to the poor weather. She denies any suicidal or homicidal ideation. She denies any auditory or visual hallucinations.  Objective: laboratory results received from her gynecologist showed that her Depakote level is low at 16.  There were no vitals filed for this visit.  Mental Status Examination  Appearance: casual Alert: Yes Attention: good  Cooperative: Yes Eye Contact: Good Speech: clear and coherent Psychomotor Activity: Psychomotor Retardation Memory/Concentration: intact Oriented: person, place, time/date and situation Mood: Dysphoric Affect: Congruent Thought Processes and Associations: Linear Fund of Knowledge: Good Thought Content: normal Insight: Good Judgement: Good  Diagnosis: bipolar disorder, currently depressed  Treatment Plan: we will increase her Wellbutrin XL to 300 mg daily, trazodone to 150 mg at bedtime, and Depakote to 1500 mg at bedtime. We will continue her Lamictal at 400 mg daily, Lithobid 300 mg daily, not pain 5 mg at bedtime, and Risperdal 1 mg at bedtime. She will return for followup in 2 months at my next available appointment.  Maija Biggers, PA-C

## 2013-01-02 ENCOUNTER — Ambulatory Visit (INDEPENDENT_AMBULATORY_CARE_PROVIDER_SITE_OTHER): Payer: No Typology Code available for payment source | Admitting: Licensed Clinical Social Worker

## 2013-01-02 DIAGNOSIS — F319 Bipolar disorder, unspecified: Secondary | ICD-10-CM

## 2013-01-02 DIAGNOSIS — F3130 Bipolar disorder, current episode depressed, mild or moderate severity, unspecified: Secondary | ICD-10-CM
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
## 2013-01-05 ENCOUNTER — Encounter: Payer: Self-pay | Admitting: Physician Assistant

## 2013-01-19 ENCOUNTER — Telehealth (HOSPITAL_COMMUNITY): Payer: Self-pay

## 2013-01-22 ENCOUNTER — Telehealth (HOSPITAL_COMMUNITY): Payer: Self-pay

## 2013-01-25 ENCOUNTER — Other Ambulatory Visit (HOSPITAL_COMMUNITY): Payer: Self-pay | Admitting: *Deleted

## 2013-01-25 ENCOUNTER — Telehealth (HOSPITAL_COMMUNITY): Payer: Self-pay | Admitting: Physician Assistant

## 2013-01-25 DIAGNOSIS — F319 Bipolar disorder, unspecified: Secondary | ICD-10-CM

## 2013-01-25 MED ORDER — BUPROPION HCL ER (XL) 150 MG PO TB24: 150.0000 mg | ORAL_TABLET | Freq: Every day | ORAL | Status: DC

## 2013-01-25 MED ORDER — LAMOTRIGINE 200 MG PO TABS: ORAL_TABLET | ORAL | Status: DC

## 2013-01-25 MED ORDER — LITHIUM CARBONATE ER 300 MG PO TBCR: 300.0000 mg | EXTENDED_RELEASE_TABLET | Freq: Every day | ORAL | Status: DC

## 2013-01-25 NOTE — Telephone Encounter (Signed)
Returned patient's phone call regarding her concerns about increased tremulousness. At her last appointment we increased Wellbutrin to 300 mg daily, increased trazodone to 150 mg at bedtime, and increased Depakote to 1500 mg at bedtime. Instructed patient that we will reduce Wellbutrin dose back to 150 mg. New prescription sent to patient's pharmacy.

## 2013-01-26 ENCOUNTER — Other Ambulatory Visit (HOSPITAL_COMMUNITY): Payer: Self-pay | Admitting: Physician Assistant

## 2013-01-26 ENCOUNTER — Telehealth (HOSPITAL_COMMUNITY): Payer: Self-pay

## 2013-01-30 ENCOUNTER — Other Ambulatory Visit (HOSPITAL_COMMUNITY): Payer: Self-pay | Admitting: *Deleted

## 2013-02-01 ENCOUNTER — Other Ambulatory Visit (HOSPITAL_COMMUNITY): Payer: Self-pay | Admitting: Physician Assistant

## 2013-02-01 ENCOUNTER — Telehealth (HOSPITAL_COMMUNITY): Payer: Self-pay | Admitting: Physician Assistant

## 2013-02-01 DIAGNOSIS — F319 Bipolar disorder, unspecified: Secondary | ICD-10-CM

## 2013-02-01 MED ORDER — TRAZODONE HCL 150 MG PO TABS: ORAL_TABLET | ORAL | Status: DC

## 2013-02-01 MED ORDER — RISPERIDONE 1 MG PO TABS: 1.0000 mg | ORAL_TABLET | Freq: Every day | ORAL | Status: DC

## 2013-02-01 NOTE — Telephone Encounter (Signed)
Received faxed refill request for:  Divalproex ER 500mg /2 at bedtime/last filled 12/07/12; Ridperidone 1mg /1 at bedtime/last filled 12/07/12; Trazodone 100 mg/ 1 tablet at beditme/last filled 12/07/12.

## 2013-02-02 ENCOUNTER — Ambulatory Visit (INDEPENDENT_AMBULATORY_CARE_PROVIDER_SITE_OTHER): Payer: No Typology Code available for payment source | Admitting: Licensed Clinical Social Worker

## 2013-02-02 ENCOUNTER — Ambulatory Visit (HOSPITAL_COMMUNITY): Payer: Self-pay | Admitting: Licensed Clinical Social Worker

## 2013-02-02 DIAGNOSIS — F3130 Bipolar disorder, current episode depressed, mild or moderate severity, unspecified: Secondary | ICD-10-CM

## 2013-02-02 DIAGNOSIS — F319 Bipolar disorder, unspecified: Secondary | ICD-10-CM
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
## 2013-02-20 ENCOUNTER — Ambulatory Visit (HOSPITAL_COMMUNITY): Payer: Medicare Other | Admitting: Physician Assistant

## 2013-03-05 ENCOUNTER — Ambulatory Visit (INDEPENDENT_AMBULATORY_CARE_PROVIDER_SITE_OTHER): Payer: MEDICARE | Admitting: Licensed Clinical Social Worker

## 2013-03-05 DIAGNOSIS — F319 Bipolar disorder, unspecified: Secondary | ICD-10-CM

## 2013-03-05 DIAGNOSIS — F3130 Bipolar disorder, current episode depressed, mild or moderate severity, unspecified: Secondary | ICD-10-CM
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
## 2013-03-07 ENCOUNTER — Other Ambulatory Visit (HOSPITAL_COMMUNITY): Payer: Self-pay | Admitting: Physician Assistant

## 2013-03-09 ENCOUNTER — Telehealth (HOSPITAL_COMMUNITY): Payer: Self-pay

## 2013-03-12 ENCOUNTER — Telehealth (HOSPITAL_COMMUNITY): Payer: Self-pay

## 2013-03-12 DIAGNOSIS — F319 Bipolar disorder, unspecified: Secondary | ICD-10-CM

## 2013-03-12 MED ORDER — DIVALPROEX SODIUM ER 500 MG PO TB24: 1500.0000 mg | ORAL_TABLET | Freq: Every day | ORAL | Status: DC

## 2013-03-12 MED ORDER — LAMOTRIGINE 200 MG PO TABS: ORAL_TABLET | ORAL | Status: DC

## 2013-03-12 MED ORDER — LITHIUM CARBONATE ER 300 MG PO TBCR: 300.0000 mg | EXTENDED_RELEASE_TABLET | Freq: Every day | ORAL | Status: DC

## 2013-04-03 ENCOUNTER — Other Ambulatory Visit (HOSPITAL_COMMUNITY): Payer: Self-pay | Admitting: Physician Assistant

## 2013-04-04 ENCOUNTER — Other Ambulatory Visit (HOSPITAL_COMMUNITY): Payer: Self-pay | Admitting: Physician Assistant

## 2013-04-04 ENCOUNTER — Ambulatory Visit (INDEPENDENT_AMBULATORY_CARE_PROVIDER_SITE_OTHER): Payer: No Typology Code available for payment source | Admitting: Licensed Clinical Social Worker

## 2013-04-04 DIAGNOSIS — F319 Bipolar disorder, unspecified: Secondary | ICD-10-CM

## 2013-04-04 DIAGNOSIS — F3130 Bipolar disorder, current episode depressed, mild or moderate severity, unspecified: Secondary | ICD-10-CM

## 2013-04-04 NOTE — Progress Notes (Signed)
   THERAPIST PROGRESS NOTE  Session Time: 2:00pm-2:50pm  Participation Level: Active  Behavioral Response: DisheveledLethargicDepressed, Hopeless and Worthless  Type of Therapy: Individual Therapy  Treatment Goals addressed: Coping  Interventions: CBT, Motivational Interviewing, Strength-based, Supportive and Reframing  Summary: [name] is a 52 y.o. female who presents with depressed mood and flat affect. She reports an increase in her depression and that she feels "numb". She questions why she feels numb and reports increased anhedonia, hopelessness and helplessness about her life. She is isolated most days, does not leave the house and does not want to interact with people. She is fearful because her husband will loose his job in August and they will not have enough money to live. She is uncertain if her husband will try to find a job or not. She reports that her and her husband don't talk much at all and that she tries to avoid interacting with him. She does not work on goals discussed and states that she does not believe making any change will alter her life. She is unwilling to commit to change in today's session. She is not attending to her diabetes and admits that her self care is poor. She reports compliance with her medication, but plans to talk to Jorje Guild, [place] at her next appointment to increase or change a medication. Her sleep is increased.    Suicidal/Homicidal: Nowithout intent/plan  Therapist Response: Assessed patients current functioning and reviewed progress. Reviewed coping strategies. Assessed patients safety and assisted in identifying protective factors.  Reviewed crisis plan with patient. Assisted patient with the expression of sadness and hopelessness. Reviewed patients self care plan. Assessed progress related to self care. Patients self care is poor. Recommend daily exercise, increased socialization and recreation. Used CBT to assist patient with the  identification of negative distortions and irrational thoughts. Encouraged patient to verbalize alternative and factual responses which challenge thought distortions. Reviewed healthy boundaries and assertive communication. Used motivational interviewing to assist and encourage patient through the change process. Explored patients barriers to change.   Plan: Return again in four weeks.  Diagnosis: Axis I: Bipolar, Depressed    Axis II: No diagnosis    Bryley Chrisman, LCSW 04/04/2013

## 2013-04-09 ENCOUNTER — Ambulatory Visit (INDEPENDENT_AMBULATORY_CARE_PROVIDER_SITE_OTHER): Payer: No Typology Code available for payment source | Admitting: Physician Assistant

## 2013-04-09 VITALS — BP 142/90 | HR 84 | Ht 65.0 in | Wt 326.2 lb

## 2013-04-09 DIAGNOSIS — F319 Bipolar disorder, unspecified: Secondary | ICD-10-CM

## 2013-04-09 MED ORDER — THIOTHIXENE 5 MG PO CAPS: ORAL_CAPSULE | ORAL | Status: DC

## 2013-04-09 MED ORDER — TRAZODONE HCL 150 MG PO TABS: ORAL_TABLET | ORAL | Status: DC

## 2013-04-09 MED ORDER — BUPROPION HCL ER (XL) 150 MG PO TB24: 150.0000 mg | ORAL_TABLET | Freq: Every day | ORAL | Status: DC

## 2013-04-10 NOTE — Progress Notes (Signed)
[facility] [id number] Progress Note  [name] [id number] 52 y.o.  04/09/2013 1:22 PM  Chief Complaint: Followup visit for bipolar disorder  History of Present Illness: Zyla presents today to followup on her treatment for bipolar disorder. On May 1 we communicated by telephone because she was experiencing increased tremulousness, and we decreased her Wellbutrin from 300 mg daily to 150 mg. She continues to have a significant amount of tremor. She also complains of feeling sick to her stomach frequently. She reports that her mood is "pretty stable," but reports that she was depressed for about a week last week. She also reports that her sleep is "terrible," and she was up all night last night. She also endorses sleeping during the day. She has been taking her Depakote around 11 AM. She endorses decreased racing thoughts. She denies any suicidal or homicidal ideation. She denies any auditory or visual hallucinations.  Suicidal Ideation: No Plan Formed: No Patient has means to carry out plan: No  Homicidal Ideation: No Plan Formed: No Patient has means to carry out plan: No  Review of Systems: Psychiatric: Agitation: No Hallucination: No Depressed Mood: Yes Insomnia: Yes Hypersomnia: No Altered Concentration: No Feels Worthless: No Grandiose Ideas: No Belief In Special Powers: No New/Increased Substance Abuse: No Compulsions: No  Neurologic: Headache: No Seizure: No Paresthesias: No  Past Medical History: Diabetes, thyroid disease, hypertension, asthma, hypercholesterolemia, obesity  Outpatient Encounter Prescriptions as of 04/09/2013  Medication Sig Dispense Refill  . albuterol (PROVENTIL HFA;VENTOLIN HFA) 108 (90 BASE) MCG/ACT inhaler Inhale 2 puffs into the lungs as needed.        Marland Kitchen albuterol (PROVENTIL) 4 MG tablet Take 4 mg by mouth 3 (three) times daily.        Marland Kitchen buPROPion (WELLBUTRIN XL) 150 MG 24 hr tablet Take 1 tablet (150 mg total) by mouth daily.   30 tablet  5  . cloNIDine (CATAPRES) 0.2 MG tablet Take 0.2 mg by mouth 3 (three) times daily.        . divalproex (DEPAKOTE ER) 500 MG 24 hr tablet Take 3 tablets (1,500 mg total) by mouth at bedtime.  90 tablet  0  . divalproex (DEPAKOTE ER) 500 MG 24 hr tablet TAKE 3 TABLETS BY MOUTH EVERY NIGHT AT BEDTIME  90 tablet  5  . furosemide (LASIX) 40 MG tablet Take 40 mg by mouth daily.        Marland Kitchen glipiZIDE-metformin (METAGLIP) 5-500 MG per tablet Take 1 tablet by mouth daily.        Marland Kitchen HYDROcodone-acetaminophen (VICODIN) 5-500 MG per tablet Take 1 tablet by mouth as needed.        . insulin glargine (LANTUS) 100 UNIT/ML injection Inject 35 Units into the skin at bedtime.        . lamoTRIgine (LAMICTAL) 200 MG tablet TAKE 2 TABLETS BY MOUTH EVERY DAY  60 tablet  0  . lisinopril (PRINIVIL,ZESTRIL) 20 MG tablet Take 20 mg by mouth daily.        Marland Kitchen lithium carbonate (LITHOBID) 300 MG CR tablet Take 1 tablet (300 mg total) by mouth daily.  30 tablet  0  . lithium carbonate (LITHOBID) 300 MG CR tablet TAKE 1 TABLET BY MOUTH EVERY DAY  30 tablet  5  . metoprolol (LOPRESSOR) 50 MG tablet Take 50 mg by mouth 2 (two) times daily.        . risperiDONE (RISPERDAL) 1 MG tablet TAKE 1 TABLET BY MOUTH EVERY NIGHT AT BEDTIME  30  tablet  0  . thiothixene (NAVANE) 5 MG capsule TAKE 1 CAPSULE BY MOUTH EVERY NIGHT AT BEDTIME  30 capsule  5  . traZODone (DESYREL) 150 MG tablet TAKE 1 TABLET BY MOUTH EVERY NIGHT AT BEDTIME  30 tablet  5  . [DISCONTINUED] buPROPion (WELLBUTRIN XL) 150 MG 24 hr tablet Take 1 tablet (150 mg total) by mouth daily.  30 tablet  0  . [DISCONTINUED] thiothixene (NAVANE) 5 MG capsule TAKE 1 CAPSULE BY MOUTH EVERY NIGHT AT BEDTIME  30 capsule  3  . [DISCONTINUED] traZODone (DESYREL) 150 MG tablet TAKE 1 TABLET BY MOUTH EVERY NIGHT AT BEDTIME  30 tablet  1   No facility-administered encounter medications on file as of 04/09/2013.    Past Psychiatric History/Hospitalization(s): Anxiety: Yes Bipolar  Disorder: Yes Depression: Yes Mania: Yes Psychosis: Yes Schizophrenia: No Personality Disorder: No Hospitalization for psychiatric illness: Yes History of Electroconvulsive Shock Therapy: No Prior Suicide Attempts: No  Physical Exam: Constitutional:  BP 142/90  Pulse 84  Ht 5\' 5"  (1.651 m)  Wt 326 lb 3.2 oz (147.963 kg)  BMI 54.28 kg/m2  General Appearance: alert, oriented, no acute distress, well nourished and casually dressed  Musculoskeletal: Strength & Muscle Tone: within normal limits Gait & Station: normal Patient leans: N/A  Psychiatric: Speech (describe rate, volume, coherence, spontaneity, and abnormalities if any): Clear and coherent at a regular rate and rhythm and normal volume  Thought Process (describe rate, content, abstract reasoning, and computation): Within normal limits  Associations: Intact  Thoughts: normal  Mental Status: Orientation: oriented to person, place, time/date and situation Mood & Affect: flat affect Attention Span & Concentration: Intact  Medical Decision Making (Choose Three): Established Problem, Stable/Improving (1), Review of Psycho-Social Stressors (1) and Review of Medication Regimen & Side Effects (2)  Assessment: Axis I: Bipolar disorder  Axis II: Deferred  Axis III: Diabetes, thyroid dysfunction,  hypertension, asthma, hypercholesterolemia, obesity  Axis IV: Moderate  Axis V: 60   Plan: She has been instructed to begin taking her Depakote at bedtime. If the nausea continues she can divide the dose so that she takes 1000 mg at bedtime and 500 mg in the morning. We will continue her Wellbutrin XL 150 mg daily, trazodone 150 mg at bedtime, Lamictal at 400 mg daily, Lithobid 300 mg daily, Navane 5 mg at bedtime, and Risperdal 1 mg at bedtime. She will return for followup in 2 weeks.   Anayeli Arel, PA-C 04/10/2013

## 2013-04-25 ENCOUNTER — Ambulatory Visit (HOSPITAL_COMMUNITY): Payer: Self-pay | Admitting: Physician Assistant

## 2013-05-01 ENCOUNTER — Encounter (HOSPITAL_COMMUNITY): Payer: Self-pay | Admitting: Physician Assistant

## 2013-05-01 ENCOUNTER — Ambulatory Visit (INDEPENDENT_AMBULATORY_CARE_PROVIDER_SITE_OTHER): Payer: No Typology Code available for payment source | Admitting: Physician Assistant

## 2013-05-01 VITALS — BP 128/82 | HR 86 | Ht 65.0 in | Wt 332.6 lb

## 2013-05-01 DIAGNOSIS — F319 Bipolar disorder, unspecified: Secondary | ICD-10-CM

## 2013-05-01 DIAGNOSIS — F3130 Bipolar disorder, current episode depressed, mild or moderate severity, unspecified: Secondary | ICD-10-CM

## 2013-05-01 MED ORDER — BENZTROPINE MESYLATE 1 MG PO TABS: 1.0000 mg | ORAL_TABLET | Freq: Two times a day (BID) | ORAL | Status: DC

## 2013-05-01 NOTE — Progress Notes (Signed)
[facility] [id number] Progress Note  [name] [id number] 52 y.o.  05/01/2013 1:26 PM  Chief Complaint: Tremor and paranoia  History of Present Illness: Airika presents today to follow up on her treatment for bipolar disorder. She reports that her tremor has gotten no better, and may be worse. She also reports that her joints are aching. She endorses a significant amount appeared ability. She states that her sleep is no better, but her nausea has improved. She is now experiencing diarrhea for about 2 weeks. She denies that she is using anything to treat her diarrhea. She assures this Clinical research associate that she is drinking a lot of water and staying hydrated. She endorses some paranoid thoughts that people are looking over her shoulder. She denies any suicidal or homicidal ideation. She denies any auditory or visual hallucinations.  Suicidal Ideation: No Plan Formed: No Patient has means to carry out plan: No  Homicidal Ideation: No Plan Formed: No Patient has means to carry out plan: No  Review of Systems: Psychiatric: Agitation: Yes Hallucination: No Depressed Mood: Yes Insomnia: Yes Hypersomnia: No Altered Concentration: No Feels Worthless: No Grandiose Ideas: No Belief In Special Powers: No New/Increased Substance Abuse: No Compulsions: No  Neurologic: Headache: No Seizure: No Paresthesias: No  Past Medical History: Diabetes, thyroid disease, hypertension, asthma, hypercholesterolemia, obesity   Outpatient Encounter Prescriptions as of 05/01/2013  Medication Sig Dispense Refill  . albuterol (PROVENTIL HFA;VENTOLIN HFA) 108 (90 BASE) MCG/ACT inhaler Inhale 2 puffs into the lungs as needed.        Marland Kitchen albuterol (PROVENTIL) 4 MG tablet Take 4 mg by mouth 3 (three) times daily.        . benztropine (COGENTIN) 1 MG tablet Take 1 tablet (1 mg total) by mouth 2 (two) times daily at 8 am and 10 pm.  60 tablet  2  . buPROPion (WELLBUTRIN XL) 150 MG 24 hr tablet Take 1 tablet  (150 mg total) by mouth daily.  30 tablet  5  . cloNIDine (CATAPRES) 0.2 MG tablet Take 0.2 mg by mouth 3 (three) times daily.        . divalproex (DEPAKOTE ER) 500 MG 24 hr tablet Take 3 tablets (1,500 mg total) by mouth at bedtime.  90 tablet  0  . divalproex (DEPAKOTE ER) 500 MG 24 hr tablet TAKE 3 TABLETS BY MOUTH EVERY NIGHT AT BEDTIME  90 tablet  5  . furosemide (LASIX) 40 MG tablet Take 40 mg by mouth daily.        Marland Kitchen glipiZIDE-metformin (METAGLIP) 5-500 MG per tablet Take 1 tablet by mouth daily.        Marland Kitchen HYDROcodone-acetaminophen (VICODIN) 5-500 MG per tablet Take 1 tablet by mouth as needed.        . insulin glargine (LANTUS) 100 UNIT/ML injection Inject 35 Units into the skin at bedtime.        . lamoTRIgine (LAMICTAL) 200 MG tablet TAKE 2 TABLETS BY MOUTH EVERY DAY  60 tablet  0  . lisinopril (PRINIVIL,ZESTRIL) 20 MG tablet Take 20 mg by mouth daily.        Marland Kitchen lithium carbonate (LITHOBID) 300 MG CR tablet Take 1 tablet (300 mg total) by mouth daily.  30 tablet  0  . lithium carbonate (LITHOBID) 300 MG CR tablet TAKE 1 TABLET BY MOUTH EVERY DAY  30 tablet  5  . metoprolol (LOPRESSOR) 50 MG tablet Take 50 mg by mouth 2 (two) times daily.        Marland Kitchen  risperiDONE (RISPERDAL) 1 MG tablet TAKE 1 TABLET BY MOUTH EVERY NIGHT AT BEDTIME  30 tablet  0  . thiothixene (NAVANE) 5 MG capsule TAKE 1 CAPSULE BY MOUTH EVERY NIGHT AT BEDTIME  30 capsule  5  . traZODone (DESYREL) 150 MG tablet TAKE 1 TABLET BY MOUTH EVERY NIGHT AT BEDTIME  30 tablet  5   No facility-administered encounter medications on file as of 05/01/2013.    Past Psychiatric History/Hospitalization(s): Anxiety: Yes Bipolar Disorder: Yes Depression: Yes Mania: Yes Psychosis: Yes Schizophrenia: No Personality Disorder: No Hospitalization for psychiatric illness: Yes History of Electroconvulsive Shock Therapy: No Prior Suicide Attempts: No  Physical Exam: Constitutional:  BP 128/82  Pulse 86  Ht 5\' 5"  (1.651 m)  Wt 332 lb 9.6  oz (150.866 kg)  BMI 55.35 kg/m2  General Appearance: alert, oriented, no acute distress, well nourished and casually dressed  Musculoskeletal: Strength & Muscle Tone: within normal limits Gait & Station: normal Patient leans: N/A  Psychiatric: Speech (describe rate, volume, coherence, spontaneity, and abnormalities if any): Clear and coherent had a regular rate and rhythm and normal volume  Thought Process (describe rate, content, abstract reasoning, and computation): Within normal limits  Associations: Intact  Thoughts: delusions  Mental Status: Orientation: oriented to person, place, time/date and situation Mood & Affect: depressed affect Attention Span & Concentration: Intact  Medical Decision Making (Choose Three): Established Problem, Stable/Improving (1), Review of Psycho-Social Stressors (1), Review of Medication Regimen & Side Effects (2) and Review of New Medication or Change in Dosage (2)  Assessment: Axis I: Bipolar depressed  Axis II: Deferred  Axis III: Past Medical History: Diabetes, thyroid disease, hypertension, asthma, hypercholesterolemia, obesity  Axis IV: Moderate  Axis V: 55   Plan: We will initiate Cogentin 1 mg twice daily, and continue her Depakote ER 1500 mg at bedtime, Wellbutrin XL 150 mg daily, trazodone 150 mg at bedtime, Lamictal 400 mg daily, Lithobid 300 mg daily, Risperdal 1 mg at bedtime, and Navane 5 mg at bedtime. She will return for followup in 4 weeks. She is encouraged to call in about one week to report on her tremor.  Kabao Leite, PA-C 05/01/2013

## 2013-05-03 ENCOUNTER — Ambulatory Visit (INDEPENDENT_AMBULATORY_CARE_PROVIDER_SITE_OTHER): Payer: No Typology Code available for payment source | Admitting: Licensed Clinical Social Worker

## 2013-05-03 ENCOUNTER — Other Ambulatory Visit (HOSPITAL_COMMUNITY): Payer: Self-pay | Admitting: Physician Assistant

## 2013-05-03 DIAGNOSIS — F319 Bipolar disorder, unspecified: Secondary | ICD-10-CM

## 2013-05-03 DIAGNOSIS — F3130 Bipolar disorder, current episode depressed, mild or moderate severity, unspecified: Secondary | ICD-10-CM

## 2013-05-03 NOTE — Progress Notes (Signed)
   THERAPIST PROGRESS NOTE  Session Time: 11:30am-12:20pm  Participation Level: Active  Behavioral Response: DisheveledLethargicAnxious and Depressed  Type of Therapy: Individual Therapy  Treatment Goals addressed: Anxiety and Coping  Interventions: CBT, DBT, Motivational Interviewing, Strength-based, Supportive and Reframing  Summary: [name] is a 52 y.o. female who presents with depressed mood and anxious affect. She reports feeling dizzy with increased tremors which she is frustrated by, but her doctors are working with her. Her blood sugars remain high and she misses her psychiatric medication for days when she does not have the money to purchase them. She processes her ongoing anger with her husband and his alcoholism. She has been asserting herself more, refusing to drive him around when he is drunk and refusing sex with him. Her husband is loosing his job and she fears increased financial strain. She is spending more time with her daughters lately and finds this is helpful for her depression. Her sleep and appetite are wnl.    Suicidal/Homicidal: Nowithout intent/plan  Therapist Response: Assessed patients current functioning and reviewed progress. Reviewed coping strategies. Assessed patients safety and assisted in identifying protective factors.  Reviewed crisis plan with patient. Assisted patient with the expression of frustration. Reviewed patients self care plan. Assessed progress related to self care. Patients self care is fair. Recommend daily exercise, increased socialization and recreation. Reviewed healthy boundaries and assertive communication. Used CBT to assist patient with the identification of negative distortions and irrational thoughts. Encouraged patient to verbalize alternative and factual responses which challenge thought distortions. Used DBT to practice mindfulness, review distraction list and improve distress tolerance skills. Used motivational interviewing  to assist and encourage patient through the change process. Explored patients barriers to change.   Plan: Return again in six weeks.  Diagnosis: Axis I: Bipolar, Depressed    Axis II: No diagnosis    Damien Batty, LCSW 05/03/2013

## 2013-05-07 ENCOUNTER — Telehealth (HOSPITAL_COMMUNITY): Payer: Self-pay | Admitting: Physician Assistant

## 2013-05-07 ENCOUNTER — Telehealth (HOSPITAL_COMMUNITY): Payer: Self-pay

## 2013-05-07 ENCOUNTER — Telehealth (HOSPITAL_COMMUNITY): Payer: Self-pay | Admitting: *Deleted

## 2013-05-07 NOTE — Telephone Encounter (Signed)
VM:Was started on new medicine-for people with Parkinsons.Tremors are much worse now.Can you tell her what is wrong? Does she need a new medicine or what? Requests call from provider

## 2013-05-07 NOTE — Telephone Encounter (Signed)
3:44pm 05/07/13 Patient called stating that she need to speak with provider Hessie Diener is having some problems with the medication (MELOCAM 15MG ) (LEVOPHROI  1MG )  Patient need to speak with Hessie Diener.Marland KitchenMarguerite Olea

## 2013-05-07 NOTE — Telephone Encounter (Signed)
Attempted to reach patient regarding increase in tremor. Left message on voicemail.

## 2013-05-08 ENCOUNTER — Telehealth (HOSPITAL_COMMUNITY): Payer: Self-pay | Admitting: Physician Assistant

## 2013-05-08 ENCOUNTER — Telehealth (HOSPITAL_COMMUNITY): Payer: Self-pay

## 2013-05-08 NOTE — Telephone Encounter (Signed)
Returned patient's call concerning tremor has not improved since the initiation of Cogentin. Recommended patient to hold thiothixene for several days to see if tremor improves. Patient understands and will comply.

## 2013-05-09 ENCOUNTER — Telehealth (HOSPITAL_COMMUNITY): Payer: Self-pay | Admitting: *Deleted

## 2013-05-09 NOTE — Telephone Encounter (Signed)
Daughter left ZO:XWRUEAV'W daughter wants to speak with Jorje Guild, PA regarding her mother's medications.

## 2013-05-11 ENCOUNTER — Telehealth (HOSPITAL_COMMUNITY): Payer: Self-pay | Admitting: Physician Assistant

## 2013-05-11 NOTE — Telephone Encounter (Signed)
Return to call to patient's daughter, Wendi Maya. Answering message stated "this is [name]." Left no message do to uncertainty of the validity of the telephone number. We do not appear to have consent to release patient information to patient's daughter.

## 2013-05-14 ENCOUNTER — Other Ambulatory Visit (HOSPITAL_COMMUNITY): Payer: Self-pay | Admitting: Psychiatry

## 2013-05-14 ENCOUNTER — Telehealth (HOSPITAL_COMMUNITY): Payer: Self-pay | Admitting: *Deleted

## 2013-05-14 NOTE — Telephone Encounter (Signed)
Saw primary MD,Dr. Sibyl Parr in last 2 weeks. Blood work was okay, Dr. Sibyl Parr reccommended that she see Jorje Guild, PA-C to have medications reviewed/adjusted

## 2013-05-14 NOTE — Telephone Encounter (Signed)
Patient left ZO:XWRU med he gave for shaking is not helping.Keeps falling down/dizzy/can't think. Needs something different.  Last noted by provider, Jorje Guild, PA, Call Documentation:  05/08/2013 : Returned patient's call concerning tremor has not improved since the initiation of Cogentin. Recommended patient to hold thiothixene for several days to see if tremor improves. Patient understands and will comply.    Noted: Per Progress Note 05/01/13: Cogentin 1 mg, twice daily.

## 2013-05-17 NOTE — Telephone Encounter (Signed)
Left message on amed ZO:XWRUEAVW covering A.Watt PA's absence still encourages pt to be seen by primary MD for assessment of physical symptoms, as well as discussing psychiatric medications w/A.Watt, PA on his return.If symptoms worsen,call MD, 911, or go to ED.

## 2013-06-13 ENCOUNTER — Ambulatory Visit (HOSPITAL_COMMUNITY): Payer: Self-pay | Admitting: Physician Assistant

## 2013-06-14 ENCOUNTER — Ambulatory Visit (HOSPITAL_COMMUNITY): Payer: Self-pay | Admitting: Licensed Clinical Social Worker

## 2013-06-18 ENCOUNTER — Ambulatory Visit (INDEPENDENT_AMBULATORY_CARE_PROVIDER_SITE_OTHER): Payer: No Typology Code available for payment source | Admitting: Physician Assistant

## 2013-06-18 VITALS — BP 123/97 | HR 78 | Ht 65.0 in | Wt 306.7 lb

## 2013-06-18 DIAGNOSIS — F319 Bipolar disorder, unspecified: Secondary | ICD-10-CM

## 2013-06-19 ENCOUNTER — Ambulatory Visit (INDEPENDENT_AMBULATORY_CARE_PROVIDER_SITE_OTHER): Payer: No Typology Code available for payment source | Admitting: Licensed Clinical Social Worker

## 2013-06-19 ENCOUNTER — Encounter (HOSPITAL_COMMUNITY): Payer: Self-pay | Admitting: Physician Assistant

## 2013-06-19 DIAGNOSIS — F3130 Bipolar disorder, current episode depressed, mild or moderate severity, unspecified: Secondary | ICD-10-CM

## 2013-06-19 DIAGNOSIS — F319 Bipolar disorder, unspecified: Secondary | ICD-10-CM

## 2013-06-19 NOTE — Progress Notes (Signed)
   THERAPIST PROGRESS NOTE  Session Time: 10:30am-11:20am  Participation Level: Active  Behavioral Response: Fairly GroomedAlertAnxious, Depressed and Irritable  Type of Therapy: Individual Therapy  Treatment Goals addressed: Coping  Interventions: CBT, Motivational Interviewing, Strength-based, Assertiveness Training, Supportive and Reframing  Summary: [name] is a 52 y.o. female who presents with depressed mood and anxious affect. She reports ongoing frustration with tremors, but is hopeful that the medication change Jorje Guild, PA made will help her. She endorses increased anger with her husband and son in law. She states that she wishes her son in law was dead because she hates how he treats her grandson.she clearly states that she does not have any homicidal ideation, intent or plan to harm her son in law.  She processes her anger over her son in laws alcoholism and its negative effects on her daughter and grandson. She reports asserting herself more frequently with her husband and is pleased by this. She discusses her "terrrible life" and how she wants her grandson to have a different experience. Her sleep and appetite are disrupted.   Suicidal/Homicidal: Nowithout intent/plan  Therapist Response: Assessed patients current functioning and reviewed progress. Reviewed coping strategies. Assessed patients safety and assisted in identifying protective factors.  Reviewed crisis plan with patient. Assisted patient with the expression of anger. Reviewed patients self care plan. Assessed progress related to self care. Patients self care is fair. Recommend daily exercise, increased socialization and recreation. Used CBT to assist patient with the identification of negative distortions and irrational thoughts. Encouraged patient to verbalize alternative and factual responses which challenge thought distortions. Reviewed healthy boundaries and assertive communication. Used motivational  interviewing to assist and encourage patient through the change process. Explored patients barriers to change.   Plan: Return again in six weeks.  Diagnosis: Axis I: Bipolar, Depressed    Axis II: No diagnosis    Urho Rio, LCSW 06/19/2013

## 2013-06-19 NOTE — Progress Notes (Signed)
[facility] [id number] Progress Note  [name] [id number] 52 y.o.  06/18/2013 4:24 AM  Chief Complaint: Bipolar disorder, Unresolving Parkinson's-like tremor  History of Present Illness: [name] presents today to followup on her treatment for bipolar disorder. She continues to complain of a prominent Parkinson's-like tremor in her hands. She reports that the tremor is causing her difficulty in eating and performing other tasks. She has seen her primary care provider who deferred care to this provider. She also endorses some increase in irritability and agitation, as well as a decreased ability to sleep. She reports that she is taking cat naps in her care. She reports her appetite is fine, but she has lost 26 pounds since her last clinic visit. She denies any suicidal ideation, but she is rather agitated with her son-in-law due to his excessive alcohol consumption and inappropriate language and behavior around her daughter and grandchild. She denies any auditory or visual hallucinations. He endorses some depressed mood that "comes and goes," and last for a few days.  Suicidal Ideation: No Plan Formed: No Patient has means to carry out plan: No  Homicidal Ideation: Yes Plan Formed: No Patient has means to carry out plan: No  Review of Systems: Psychiatric: Agitation: Yes Hallucination: No Depressed Mood: Yes Insomnia: Yes Hypersomnia: No Altered Concentration: No Feels Worthless: No Grandiose Ideas: No Belief In Special Powers: No New/Increased Substance Abuse: No Compulsions: No  Neurologic: Headache: No Seizure: No Paresthesias: No  Past Medical History: Diabetes, thyroid disease, hypertension, asthma, hypercholesterolemia, obesity  Outpatient Encounter Prescriptions as of 06/18/2013  Medication Sig Dispense Refill  . albuterol (PROVENTIL HFA;VENTOLIN HFA) 108 (90 BASE) MCG/ACT inhaler Inhale 2 puffs into the lungs as needed.        Marland Kitchen albuterol (PROVENTIL) 4  MG tablet Take 4 mg by mouth 3 (three) times daily.        . benztropine (COGENTIN) 1 MG tablet Take 1 tablet (1 mg total) by mouth 2 (two) times daily at 8 am and 10 pm.  60 tablet  2  . buPROPion (WELLBUTRIN XL) 150 MG 24 hr tablet Take 1 tablet (150 mg total) by mouth daily.  30 tablet  5  . cloNIDine (CATAPRES) 0.2 MG tablet Take 0.2 mg by mouth 3 (three) times daily.        . divalproex (DEPAKOTE ER) 500 MG 24 hr tablet Take 3 tablets (1,500 mg total) by mouth at bedtime.  90 tablet  0  . divalproex (DEPAKOTE ER) 500 MG 24 hr tablet TAKE 3 TABLETS BY MOUTH EVERY NIGHT AT BEDTIME  90 tablet  5  . furosemide (LASIX) 40 MG tablet Take 40 mg by mouth daily.        Marland Kitchen glipiZIDE-metformin (METAGLIP) 5-500 MG per tablet Take 1 tablet by mouth daily.        Marland Kitchen HYDROcodone-acetaminophen (VICODIN) 5-500 MG per tablet Take 1 tablet by mouth as needed.        . insulin glargine (LANTUS) 100 UNIT/ML injection Inject 35 Units into the skin at bedtime.        . lamoTRIgine (LAMICTAL) 200 MG tablet TAKE 2 TABLETS BY MOUTH EVERY DAY  60 tablet  4  . lisinopril (PRINIVIL,ZESTRIL) 20 MG tablet Take 20 mg by mouth daily.        Marland Kitchen lithium carbonate (LITHOBID) 300 MG CR tablet Take 1 tablet (300 mg total) by mouth daily.  30 tablet  0  . lithium carbonate (LITHOBID) 300 MG CR tablet TAKE  1 TABLET BY MOUTH EVERY DAY  30 tablet  5  . metoprolol (LOPRESSOR) 50 MG tablet Take 50 mg by mouth 2 (two) times daily.        . risperiDONE (RISPERDAL) 1 MG tablet TAKE 1 TABLET BY MOUTH EVERY NIGHT AT BEDTIME  30 tablet  4  . thiothixene (NAVANE) 5 MG capsule TAKE 1 CAPSULE BY MOUTH EVERY NIGHT AT BEDTIME  30 capsule  5  . traZODone (DESYREL) 150 MG tablet TAKE 1 TABLET BY MOUTH EVERY NIGHT AT BEDTIME  30 tablet  5   No facility-administered encounter medications on file as of 06/18/2013.    Past Psychiatric History/Hospitalization(s): Anxiety: Yes Bipolar Disorder: Yes Depression: Yes Mania: Yes Psychosis:  Yes Schizophrenia: No Personality Disorder: No Hospitalization for psychiatric illness: Yes History of Electroconvulsive Shock Therapy: No Prior Suicide Attempts: No  Physical Exam: Constitutional:  BP 123/97  Pulse 78  Ht 5\' 5"  (1.651 m)  Wt 306 lb 11.2 oz (139.118 kg)  BMI 51.04 kg/m2  General Appearance: alert, oriented, no acute distress, well nourished and nicely dressed  Musculoskeletal: Strength & Muscle Tone: Parkinson's-like tremor in both hands Gait & Station: normal Patient leans: N/A  Psychiatric: Speech (describe rate, volume, coherence, spontaneity, and abnormalities if any): Clear and coherent at a irregular rate and rhythm and normal volume  Thought Process (describe rate, content, abstract reasoning, and computation): Within normal limits  Associations: Intact  Thoughts: homicidal ideation  Mental Status: Orientation: oriented to person, place, time/date and situation Mood & Affect: normal affect Attention Span & Concentration: Intact  Medical Decision Making (Choose Three): Review of Psycho-Social Stressors (1), Established Problem, Worsening (2), Review of Medication Regimen & Side Effects (2) and Review of New Medication or Change in Dosage (2)  Assessment: Axis I: Bipolar NOS  Axis II: Deferred  Axis III: Diabetes, thyroid disease, hypertension, asthma, hypercholesterolemia, obesity  Axis IV: Moderate to severe  Axis V: 55   Plan: We will discontinue her Navane to hopefully eliminate her tremor. We will continue her Cogentin 1 mg twice daily, Depakote ER 1500 mg at bedtime, Wellbutrin XL 150 mg daily, trazodone 150 mg at bedtime, Lamictal 400 mg daily, Lithobid 300 mg daily, and Risperdal 1 mg at bedtime. She will return for followup in one week.  Madalene Mickler, PA-C 06/19/2013

## 2013-06-26 ENCOUNTER — Ambulatory Visit (HOSPITAL_COMMUNITY): Payer: Self-pay | Admitting: Physician Assistant

## 2013-06-26 ENCOUNTER — Ambulatory Visit (HOSPITAL_COMMUNITY): Payer: Self-pay | Admitting: Licensed Clinical Social Worker

## 2013-07-03 ENCOUNTER — Ambulatory Visit (HOSPITAL_COMMUNITY): Payer: Self-pay | Admitting: Physician Assistant

## 2013-07-25 ENCOUNTER — Ambulatory Visit (HOSPITAL_COMMUNITY): Payer: No Typology Code available for payment source | Admitting: Physician Assistant

## 2013-07-25 MED ORDER — RISPERIDONE 2 MG PO TABS: 2.0000 mg | ORAL_TABLET | Freq: Every day | ORAL | Status: DC

## 2013-07-31 ENCOUNTER — Ambulatory Visit (INDEPENDENT_AMBULATORY_CARE_PROVIDER_SITE_OTHER): Payer: No Typology Code available for payment source | Admitting: Licensed Clinical Social Worker

## 2013-07-31 DIAGNOSIS — F3130 Bipolar disorder, current episode depressed, mild or moderate severity, unspecified: Secondary | ICD-10-CM

## 2013-07-31 DIAGNOSIS — F319 Bipolar disorder, unspecified: Secondary | ICD-10-CM

## 2013-08-01 NOTE — Progress Notes (Signed)
   THERAPIST PROGRESS NOTE  Session Time: 4:00pm-4:50pm  Participation Level: Active  Behavioral Response: Fairly GroomedAlertAnxious and Irritable  Type of Therapy: Individual Therapy  Treatment Goals addressed: Coping  Interventions: CBT, Motivational Interviewing, Strength-based, Supportive and Reframing  Summary: [name] is a 52 y.o. female who presents with hypomanic mood and anxious affect. Patients speech is rapid and pressured. She endorses hypomanic symptoms, but reports that she is sleeping well. She endorses increased agitation and irritability. She is upset and angry with her husband and her son in law over how they treat women and her grand son. She processes her ongoing desire to leave her husband and her efforts to find a place to live which she can afford. She is pleased to report that she is asserting herself more and not subcoming to her husbands pressure to have sex. She feels stronger emotionally. Her self care is fair. Her sleep and appetite are wnl.   Suicidal/Homicidal: Nowithout intent/plan  Therapist Response: Assessed patients current functioning and reviewed progress. Reviewed coping strategies. Assessed patients safety and assisted in identifying protective factors.  Reviewed crisis plan with patient. Assisted patient with the expression of anger. Reviewed patients self care plan. Assessed progress related to self care. Patients self care is fair. Recommend daily exercise, increased socialization and recreation. Used CBT to assist patient with the identification of negative distortions and irrational thoughts. Encouraged patient to verbalize alternative and factual responses which challenge thought distortions. Reviewed healthy boundaries and assertive communication. Used motivational interviewing to assist and encourage patient through the change process. Explored patients barriers to change.   Plan: Return again in six weeks.  Diagnosis: Axis I: Bipolar,  mixed    Axis II: No diagnosis    Makell Cyr, LCSW 08/01/2013

## 2013-08-09 ENCOUNTER — Ambulatory Visit (INDEPENDENT_AMBULATORY_CARE_PROVIDER_SITE_OTHER): Payer: No Typology Code available for payment source | Admitting: Psychiatry

## 2013-08-09 ENCOUNTER — Encounter (HOSPITAL_COMMUNITY): Payer: Self-pay | Admitting: Psychiatry

## 2013-08-09 VITALS — BP 141/90 | HR 66 | Ht 65.0 in | Wt 308.0 lb

## 2013-08-09 DIAGNOSIS — F319 Bipolar disorder, unspecified: Secondary | ICD-10-CM

## 2013-08-09 DIAGNOSIS — Z79899 Other long term (current) drug therapy: Secondary | ICD-10-CM

## 2013-08-09 MED ORDER — DIVALPROEX SODIUM ER 500 MG PO TB24: 1500.0000 mg | ORAL_TABLET | Freq: Every day | ORAL | Status: DC

## 2013-08-09 MED ORDER — RISPERIDONE 2 MG PO TABS: 2.0000 mg | ORAL_TABLET | Freq: Every day | ORAL | Status: DC
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
## 2013-08-10 ENCOUNTER — Other Ambulatory Visit (HOSPITAL_COMMUNITY): Payer: Self-pay | Admitting: Physician Assistant

## 2013-08-10 DIAGNOSIS — F319 Bipolar disorder, unspecified: Secondary | ICD-10-CM

## 2013-08-13 NOTE — Telephone Encounter (Signed)
Chart reviewed. Refill appropriate. 

## 2013-09-04 ENCOUNTER — Other Ambulatory Visit (HOSPITAL_COMMUNITY): Payer: Self-pay | Admitting: Psychiatry

## 2013-09-05 ENCOUNTER — Other Ambulatory Visit (HOSPITAL_COMMUNITY): Payer: Self-pay | Admitting: Psychiatry

## 2013-09-05 NOTE — Telephone Encounter (Signed)
Need Depakote level for future refill of depakote

## 2013-09-12 ENCOUNTER — Ambulatory Visit (INDEPENDENT_AMBULATORY_CARE_PROVIDER_SITE_OTHER): Payer: No Typology Code available for payment source | Admitting: Licensed Clinical Social Worker

## 2013-09-12 DIAGNOSIS — F319 Bipolar disorder, unspecified: Secondary | ICD-10-CM

## 2013-09-12 DIAGNOSIS — F3130 Bipolar disorder, current episode depressed, mild or moderate severity, unspecified: Secondary | ICD-10-CM
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
## 2013-09-13 ENCOUNTER — Ambulatory Visit (INDEPENDENT_AMBULATORY_CARE_PROVIDER_SITE_OTHER): Payer: No Typology Code available for payment source | Admitting: Psychiatry

## 2013-09-13 ENCOUNTER — Encounter (HOSPITAL_COMMUNITY): Payer: Self-pay | Admitting: Psychiatry

## 2013-09-13 VITALS — BP 163/83 | HR 95 | Ht 65.0 in | Wt 306.8 lb

## 2013-09-13 DIAGNOSIS — F319 Bipolar disorder, unspecified: Secondary | ICD-10-CM

## 2013-09-13 MED ORDER — RISPERIDONE 2 MG PO TABS: 2.0000 mg | ORAL_TABLET | Freq: Every day | ORAL | Status: DC

## 2013-09-13 MED ORDER — TRAZODONE HCL 150 MG PO TABS: ORAL_TABLET | ORAL | Status: DC

## 2013-09-13 MED ORDER — BUPROPION HCL ER (XL) 150 MG PO TB24: 150.0000 mg | ORAL_TABLET | Freq: Every day | ORAL | Status: DC

## 2013-09-13 MED ORDER — DIVALPROEX SODIUM ER 500 MG PO TB24: 1500.0000 mg | ORAL_TABLET | Freq: Every day | ORAL | Status: DC

## 2013-09-13 MED ORDER — LITHIUM CARBONATE ER 300 MG PO TBCR: 300.0000 mg | EXTENDED_RELEASE_TABLET | Freq: Two times a day (BID) | ORAL | Status: DC

## 2013-09-13 MED ORDER — BENZTROPINE MESYLATE 1 MG PO TABS: ORAL_TABLET | ORAL | Status: DC
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
## 2013-10-09 ENCOUNTER — Other Ambulatory Visit (HOSPITAL_COMMUNITY): Payer: Self-pay | Admitting: Psychiatry

## 2013-10-09 ENCOUNTER — Other Ambulatory Visit (HOSPITAL_COMMUNITY): Payer: Self-pay | Admitting: Physician Assistant

## 2013-10-09 DIAGNOSIS — F319 Bipolar disorder, unspecified: Secondary | ICD-10-CM

## 2013-10-11 ENCOUNTER — Ambulatory Visit (INDEPENDENT_AMBULATORY_CARE_PROVIDER_SITE_OTHER): Payer: No Typology Code available for payment source | Admitting: Psychiatry

## 2013-10-11 ENCOUNTER — Encounter (HOSPITAL_COMMUNITY): Payer: Self-pay | Admitting: Psychiatry

## 2013-10-11 VITALS — BP 154/94 | HR 82 | Ht 65.0 in | Wt 307.6 lb

## 2013-10-11 DIAGNOSIS — F319 Bipolar disorder, unspecified: Secondary | ICD-10-CM

## 2013-10-11 MED ORDER — LITHIUM CARBONATE ER 300 MG PO TBCR: 300.0000 mg | EXTENDED_RELEASE_TABLET | Freq: Two times a day (BID) | ORAL | Status: DC

## 2013-10-11 MED ORDER — DIVALPROEX SODIUM ER 500 MG PO TB24: ORAL_TABLET | ORAL | Status: DC

## 2013-10-11 MED ORDER — LAMOTRIGINE 200 MG PO TABS: ORAL_TABLET | ORAL | Status: DC

## 2013-10-11 MED ORDER — BENZTROPINE MESYLATE 1 MG PO TABS: ORAL_TABLET | ORAL | Status: DC

## 2013-10-11 MED ORDER — RISPERIDONE 2 MG PO TABS: 2.0000 mg | ORAL_TABLET | Freq: Every day | ORAL | Status: DC
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
## 2013-11-08 ENCOUNTER — Ambulatory Visit (INDEPENDENT_AMBULATORY_CARE_PROVIDER_SITE_OTHER): Payer: No Typology Code available for payment source | Admitting: Licensed Clinical Social Worker

## 2013-11-08 DIAGNOSIS — F3130 Bipolar disorder, current episode depressed, mild or moderate severity, unspecified: Secondary | ICD-10-CM

## 2013-11-08 DIAGNOSIS — F319 Bipolar disorder, unspecified: Secondary | ICD-10-CM

## 2013-11-08 NOTE — Progress Notes (Signed)
   THERAPIST PROGRESS NOTE  Session Time: 2:00pm-2:50pm  Participation Level: Active  Behavioral Response: Fairly GroomedAlertAnxious and Depressed  Type of Therapy: Individual Therapy  Treatment Goals addressed: Coping  Interventions: CBT, Motivational Interviewing, Strength-based, Assertiveness Training, Supportive and Reframing  Summary: [name] is a 53 y.o. female who presents with depressed mood and anxious affect. She reports an increase in feelings of anxiety. She is very upset to learn that this Clinical research associate is leaving this practice. She processes her uncertainty about continuing therapy.  Discussed patients progress in treatment and reviewed assertiveness strategies to continue at home.  Suicidal/Homicidal: Nowithout intent/plan  Therapist Response: Assessed patients current functioning and reviewed progress. Reviewed coping strategies. Assessed patients safety and assisted in identifying protective factors.  Reviewed crisis plan with patient. Assisted patient with the expression of frustration . Reviewed patients self care plan. Assessed progress related to self care. Patients self care is fair. Recommend daily exercise, increased socialization and recreation. Used CBT to assist patient with the identification of negative distortions and irrational thoughts. Encouraged patient to verbalize alternative and factual responses which challenge thought distortions. Reviewed healthy boundaries and assertive communication. Used motivational interviewing to assist and encourage patient through the change process. Explored patients barriers to change.   Plan: Patient does not want to continue therapy at this time. Informed patient that if she wants to in the future to contact this office for a referral.   Diagnosis: Axis I: Bipolar, Depressed    Axis II: No diagnosis    Queena Monrreal, LCSW 11/08/2013

## 2013-11-30 ENCOUNTER — Other Ambulatory Visit (HOSPITAL_COMMUNITY): Payer: Self-pay | Admitting: Physician Assistant

## 2013-12-10 ENCOUNTER — Ambulatory Visit (INDEPENDENT_AMBULATORY_CARE_PROVIDER_SITE_OTHER): Payer: No Typology Code available for payment source | Admitting: Psychiatry

## 2013-12-10 ENCOUNTER — Encounter (HOSPITAL_COMMUNITY): Payer: Self-pay | Admitting: Psychiatry

## 2013-12-10 VITALS — BP 104/78 | HR 72 | Ht 65.0 in | Wt 317.4 lb

## 2013-12-10 DIAGNOSIS — F319 Bipolar disorder, unspecified: Secondary | ICD-10-CM

## 2013-12-10 MED ORDER — DIVALPROEX SODIUM ER 500 MG PO TB24: ORAL_TABLET | ORAL | Status: DC

## 2013-12-10 MED ORDER — BENZTROPINE MESYLATE 1 MG PO TABS: ORAL_TABLET | ORAL | Status: DC

## 2013-12-10 MED ORDER — LITHIUM CARBONATE ER 300 MG PO TBCR: 300.0000 mg | EXTENDED_RELEASE_TABLET | Freq: Two times a day (BID) | ORAL | Status: DC

## 2013-12-10 MED ORDER — RISPERIDONE 2 MG PO TABS: 2.0000 mg | ORAL_TABLET | Freq: Every day | ORAL | Status: DC
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
## 2013-12-12 ENCOUNTER — Telehealth (HOSPITAL_COMMUNITY): Payer: Self-pay | Admitting: Psychiatry

## 2013-12-12 ENCOUNTER — Telehealth (HOSPITAL_COMMUNITY): Payer: Self-pay | Admitting: *Deleted

## 2013-12-12 NOTE — Telephone Encounter (Signed)
Pt states her Depakote is too expensive with her Medicare and will need a new drug prescription.

## 2013-12-12 NOTE — Telephone Encounter (Signed)
I returned patient's phone call I left a message. 

## 2014-01-02 ENCOUNTER — Other Ambulatory Visit (HOSPITAL_COMMUNITY): Payer: Self-pay | Admitting: Physician Assistant

## 2014-03-12 ENCOUNTER — Ambulatory Visit (INDEPENDENT_AMBULATORY_CARE_PROVIDER_SITE_OTHER): Payer: No Typology Code available for payment source | Admitting: Psychiatry

## 2014-03-12 ENCOUNTER — Encounter (HOSPITAL_COMMUNITY): Payer: Self-pay | Admitting: Psychiatry

## 2014-03-12 VITALS — BP 152/89 | HR 82 | Ht 65.0 in | Wt 324.0 lb

## 2014-03-12 DIAGNOSIS — F319 Bipolar disorder, unspecified: Secondary | ICD-10-CM

## 2014-03-12 MED ORDER — LITHIUM CARBONATE ER 300 MG PO TBCR: EXTENDED_RELEASE_TABLET | ORAL | Status: DC

## 2014-03-12 MED ORDER — BUPROPION HCL ER (XL) 150 MG PO TB24: 150.0000 mg | ORAL_TABLET | Freq: Every day | ORAL | Status: DC

## 2014-03-12 MED ORDER — RISPERIDONE 3 MG PO TABS: 3.0000 mg | ORAL_TABLET | Freq: Every day | ORAL | Status: DC

## 2014-03-12 MED ORDER — BENZTROPINE MESYLATE 1 MG PO TABS: ORAL_TABLET | ORAL | Status: DC

## 2014-03-12 MED ORDER — TRAZODONE HCL 150 MG PO TABS: ORAL_TABLET | ORAL | Status: DC
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
## 2014-03-28 ENCOUNTER — Other Ambulatory Visit (HOSPITAL_COMMUNITY): Payer: Self-pay | Admitting: Family Medicine

## 2014-03-28 DIAGNOSIS — Z1231 Encounter for screening mammogram for malignant neoplasm of breast: Secondary | ICD-10-CM

## 2014-04-03 ENCOUNTER — Ambulatory Visit (HOSPITAL_COMMUNITY)
Admission: RE | Admit: 2014-04-03 | Discharge: 2014-04-03 | Disposition: A | Discharge status: Home / Self Care | Payer: Medicare Other | Source: Ambulatory Visit | Attending: Family Medicine | Admitting: Family Medicine

## 2014-04-03 DIAGNOSIS — Z1231 Encounter for screening mammogram for malignant neoplasm of breast: Secondary | ICD-10-CM | POA: Insufficient documentation

## 2014-04-11 ENCOUNTER — Encounter (HOSPITAL_COMMUNITY): Payer: Self-pay | Admitting: Psychiatry

## 2014-04-11 ENCOUNTER — Ambulatory Visit (INDEPENDENT_AMBULATORY_CARE_PROVIDER_SITE_OTHER): Payer: No Typology Code available for payment source | Admitting: Psychiatry

## 2014-04-11 VITALS — BP 110/71 | HR 71 | Ht 65.0 in | Wt 319.4 lb

## 2014-04-11 DIAGNOSIS — F319 Bipolar disorder, unspecified: Secondary | ICD-10-CM

## 2014-04-11 DIAGNOSIS — Z79899 Other long term (current) drug therapy: Secondary | ICD-10-CM

## 2014-04-11 MED ORDER — RISPERIDONE 3 MG PO TABS: 3.0000 mg | ORAL_TABLET | Freq: Every day | ORAL | Status: DC

## 2014-04-11 MED ORDER — BUPROPION HCL ER (XL) 150 MG PO TB24: 150.0000 mg | ORAL_TABLET | Freq: Every day | ORAL | Status: DC

## 2014-04-11 MED ORDER — TRAZODONE HCL 150 MG PO TABS: ORAL_TABLET | ORAL | Status: DC

## 2014-04-11 MED ORDER — LITHIUM CARBONATE ER 300 MG PO TBCR: EXTENDED_RELEASE_TABLET | ORAL | Status: DC

## 2014-04-11 MED ORDER — BENZTROPINE MESYLATE 1 MG PO TABS: ORAL_TABLET | ORAL | Status: DC
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
## 2014-05-06 ENCOUNTER — Ambulatory Visit (INDEPENDENT_AMBULATORY_CARE_PROVIDER_SITE_OTHER): Payer: No Typology Code available for payment source | Admitting: Licensed Clinical Social Worker

## 2014-05-06 DIAGNOSIS — F3189 Other bipolar disorder: Secondary | ICD-10-CM

## 2014-05-14 LAB — LITHIUM LEVEL: LITHIUM LVL: 0.5 meq/L — AB (ref 0.80–1.40)

## 2014-06-04 ENCOUNTER — Other Ambulatory Visit (HOSPITAL_COMMUNITY): Payer: Self-pay | Admitting: Psychiatry

## 2014-06-12 ENCOUNTER — Ambulatory Visit (HOSPITAL_COMMUNITY): Payer: Self-pay | Admitting: Psychiatry

## 2014-06-14 ENCOUNTER — Ambulatory Visit (INDEPENDENT_AMBULATORY_CARE_PROVIDER_SITE_OTHER): Payer: No Typology Code available for payment source | Admitting: Licensed Clinical Social Worker

## 2014-06-14 DIAGNOSIS — F3189 Other bipolar disorder: Secondary | ICD-10-CM

## 2014-06-24 ENCOUNTER — Encounter (HOSPITAL_COMMUNITY): Payer: Self-pay | Admitting: Psychiatry

## 2014-06-24 ENCOUNTER — Ambulatory Visit (INDEPENDENT_AMBULATORY_CARE_PROVIDER_SITE_OTHER): Payer: No Typology Code available for payment source | Admitting: Psychiatry

## 2014-06-24 VITALS — BP 148/88 | HR 80 | Ht 65.0 in | Wt 325.0 lb

## 2014-06-24 DIAGNOSIS — F319 Bipolar disorder, unspecified: Secondary | ICD-10-CM

## 2014-06-24 MED ORDER — BENZTROPINE MESYLATE 1 MG PO TABS: ORAL_TABLET | ORAL | Status: DC

## 2014-06-24 MED ORDER — LITHIUM CARBONATE ER 300 MG PO TBCR: EXTENDED_RELEASE_TABLET | ORAL | Status: DC

## 2014-06-24 MED ORDER — BUPROPION HCL ER (XL) 150 MG PO TB24: 150.0000 mg | ORAL_TABLET | Freq: Every day | ORAL | Status: DC

## 2014-06-24 MED ORDER — RISPERIDONE 3 MG PO TABS: 3.0000 mg | ORAL_TABLET | Freq: Every day | ORAL | Status: DC

## 2014-06-24 MED ORDER — TRAZODONE HCL 150 MG PO TABS: ORAL_TABLET | ORAL | Status: DC

## 2014-06-24 NOTE — Progress Notes (Signed)
[facility] [address] Progress Note  [name] [id number] 53 y.o.  Chief Complaint:  Medication management and followup.      History of Present Illness:  [name] came for her followup appointment.  She is taking her psychotropic medication and denies any side effects.  Sometimes she has shakes and tremors and she admitted skipping Cogentin .  She is hoping that her husband go back to work.  Her husband is labile from the work for past few months and he drinks alcohol everyday.  Recently her husband is talking about looking for a job.  Patient is more compliant with psychotropic medication.  Since she is taking the medication as prescribed she is less anxious and less depressed.  She continues to have visual hallucination however she is sleeping better.  She denies any agitation or any anger.  She had a lithium level which was 0.50.  She started seeing a therapist [name] at [place] and she is hopeful with the therapy.  Patient was to continue her current psychotropic medication.  Patient lives with her husband.  She is on disability because of chronic mental illness.  Suicidal Ideation: No Plan Formed: No Patient has means to carry out plan: No  Homicidal Ideation: No Plan Formed: No Patient has means to carry out plan: No  Review of Systems: Psychiatric: Agitation: No Hallucination: No Depressed Mood: Yes Insomnia: No Hypersomnia: No Altered Concentration: No Feels Worthless: No Grandiose Ideas: No Belief In Special Powers: No New/Increased Substance Abuse: No Compulsions: No  Neurologic: Headache: Yes Seizure: No Paresthesias: No  Past Medical History:  Diabetes, thyroid disease, hypertension, asthma, hypercholesterolemia, obesity. She see PA at [facility].    Alcohol and substance use history. Patient has history of heavy drinking in the past.  She has DWI in the past.  She has not drinking in past 12 months.  Outpatient Encounter Prescriptions as of  06/24/2014  Medication Sig  . albuterol (PROVENTIL HFA;VENTOLIN HFA) 108 (90 BASE) MCG/ACT inhaler Inhale 2 puffs into the lungs as needed.    Marland Kitchen amLODipine (NORVASC) 10 MG tablet   . atorvastatin (LIPITOR) 40 MG tablet   . benztropine (COGENTIN) 1 MG tablet TAKE 1 TABLET BY MOUTH TWICE DAILY AT 8 AM AND 10 PM  . buPROPion (WELLBUTRIN XL) 150 MG 24 hr tablet Take 1 tablet (150 mg total) by mouth daily.  . cyclobenzaprine (FLEXERIL) 10 MG tablet   . diclofenac (VOLTAREN) 75 MG EC tablet   . fluticasone (FLONASE) 50 MCG/ACT nasal spray   . HUMULIN 70/30 (70-30) 100 UNIT/ML injection   . insulin glargine (LANTUS) 100 UNIT/ML injection Inject 35 Units into the skin at bedtime.    Marland Kitchen levothyroxine (SYNTHROID, LEVOTHROID) 50 MCG tablet   . lithium carbonate (LITHOBID) 300 MG CR tablet Take 1 in AM and 2 at bed time  . metFORMIN (GLUCOPHAGE) 1000 MG tablet   . metoprolol succinate (TOPROL-XL) 50 MG 24 hr tablet   . risperiDONE (RISPERDAL) 3 MG tablet Take 1 tablet (3 mg total) by mouth at bedtime.  . topiramate (TOPAMAX) 100 MG tablet   . traZODone (DESYREL) 150 MG tablet TAKE 1 TABLET BY MOUTH EVERY NIGHT AT BEDTIME  . [DISCONTINUED] benztropine (COGENTIN) 1 MG tablet TAKE 1 TABLET BY MOUTH TWICE DAILY AT 8 AM AND 10 PM  . [DISCONTINUED] buPROPion (WELLBUTRIN XL) 150 MG 24 hr tablet Take 1 tablet (150 mg total) by mouth daily.  . [DISCONTINUED] lithium carbonate (LITHOBID) 300 MG CR tablet Take 1  in AM and 2 at bed time  . [DISCONTINUED] risperiDONE (RISPERDAL) 3 MG tablet TAKE 1 TABLET BY MOUTH EVERY NIGHT AT BEDTIME  . [DISCONTINUED] traZODone (DESYREL) 150 MG tablet TAKE 1 TABLET BY MOUTH EVERY NIGHT AT BEDTIME    Past Psychiatric History/Hospitalization(s): Patient has at least 4-5 psychiatric hospitalization.  In the past she has tried Cymbalta , Geodon and Navane.  She endorses multiple suicidal attempts and overdose.  She's been seeing psychiatrist at early age.  She saw psychiatrist at  [facility] and then [facility] mental [facility].  Her last psychiatric admission was in 2011.  Patient has history of paranoia, hallucination, manic-like symptoms, depression and hallucination. Anxiety: Yes Bipolar Disorder: Yes Depression: Yes Mania: Yes Psychosis: Yes Schizophrenia: No Personality Disorder: No Hospitalization for psychiatric illness: Yes History of Electroconvulsive Shock Therapy: No Prior Suicide Attempts: Yes  Recent Results (from the past 2160 hour(s))  LITHIUM LEVEL     Status: Abnormal   Collection Time    05/13/14 10:39 AM      Result Value Ref Range   Lithium Lvl 0.50 (*) 0.80 - 1.40 mEq/L    Physical Exam: Constitutional:  BP 148/88  Pulse 80  Ht 5\' 5"  (1.651 m)  Wt 325 lb (147.419 kg)  BMI 54.08 kg/m2  General Appearance: well nourished and obese  Musculoskeletal: Strength & Muscle Tone: tremors Gait & Station: normal Patient leans: N/A  Mental status examination Patient is obese female who is casually dressed and fairly groomed.  Her thought process is slow but logical and goal directed.  She endorsed visual hallucination seeing shadows but denies any suicidal thoughts or homicidal thoughts.  She denies any auditory hallucination.  She describes her mood as anxious and her affect is constricted.  She has mild tremors.  Her psychomotor activity is decreased.  Her fund of knowledge is average.  She has no memory impairment.  There were no flight of ideas or any loose association.  Her speech is slow but fluent .  She is alert and oriented x3.  Her insight judgment and impulse control is okay.  Established Problem, Stable/Improving (1), Review of Psycho-Social Stressors (1), Review or order clinical lab tests (1), Review of Last Therapy Session (1) and Review of Medication Regimen & Side Effects (2)  Assessment: Axis I: Bipolar NOS  Axis II: Deferred  Axis III: Diabetes, thyroid disease, hypertension, asthma, hypercholesterolemia,  obesity  Axis IV: Moderate to severe  Axis V: 55   Plan:  I review her lithium level which is 0.4.  Patient is fairly stable on her current psychotropic medication.  Encouraged to keep appointment with counselor for coping and social skills.  I will continue Risperdal 3 mg at bedtime, trazodone 150 mg at bedtime, Wellbutrin XL 150 mg daily, Cogentin 1 mg twice a day and lithium 300 mg in the morning and 600 mg at bedtime.  Recommended to call back if she has any question or any concern.  Followup in 3 months.  Jarren Para T., MD 06/24/2014

## 2014-07-21 ENCOUNTER — Other Ambulatory Visit (HOSPITAL_COMMUNITY): Payer: Self-pay | Admitting: Psychiatry

## 2014-07-21 NOTE — Telephone Encounter (Signed)
Given on 06/24/14 with 2 refills.

## 2014-07-26 ENCOUNTER — Ambulatory Visit (INDEPENDENT_AMBULATORY_CARE_PROVIDER_SITE_OTHER): Payer: No Typology Code available for payment source | Admitting: Licensed Clinical Social Worker

## 2014-07-26 DIAGNOSIS — F3181 Bipolar II disorder: Secondary | ICD-10-CM

## 2014-07-28 DIAGNOSIS — I011 Acute rheumatic endocarditis: Secondary | ICD-10-CM

## 2014-07-28 HISTORY — DX: Acute rheumatic endocarditis: I01.1

## 2014-09-23 ENCOUNTER — Ambulatory Visit (HOSPITAL_COMMUNITY): Payer: Self-pay | Admitting: Psychiatry

## 2014-10-08 ENCOUNTER — Ambulatory Visit (INDEPENDENT_AMBULATORY_CARE_PROVIDER_SITE_OTHER): Payer: No Typology Code available for payment source | Admitting: Psychiatry

## 2014-10-08 ENCOUNTER — Encounter (HOSPITAL_COMMUNITY): Payer: Self-pay | Admitting: Psychiatry

## 2014-10-08 VITALS — BP 156/76 | HR 76 | Ht 65.0 in | Wt 321.2 lb

## 2014-10-08 DIAGNOSIS — F3130 Bipolar disorder, current episode depressed, mild or moderate severity, unspecified: Secondary | ICD-10-CM

## 2014-10-08 DIAGNOSIS — F319 Bipolar disorder, unspecified: Secondary | ICD-10-CM

## 2014-10-08 DIAGNOSIS — F3162 Bipolar disorder, current episode mixed, moderate: Secondary | ICD-10-CM

## 2014-10-08 MED ORDER — RISPERIDONE 3 MG PO TABS: 3.0000 mg | ORAL_TABLET | Freq: Every day | ORAL | Status: DC

## 2014-10-08 MED ORDER — BUPROPION HCL ER (XL) 300 MG PO TB24
300.0000 mg | ORAL_TABLET | Freq: Every morning | ORAL | Status: DC
Start: 2014-10-08 — End: 2014-12-09

## 2014-10-08 MED ORDER — LITHIUM CARBONATE ER 300 MG PO TBCR: EXTENDED_RELEASE_TABLET | ORAL | Status: DC

## 2014-10-08 MED ORDER — BENZTROPINE MESYLATE 1 MG PO TABS: ORAL_TABLET | ORAL | Status: DC

## 2014-10-08 MED ORDER — TRAZODONE HCL 150 MG PO TABS: ORAL_TABLET | ORAL | Status: DC

## 2014-10-08 NOTE — Progress Notes (Signed)
[facility] [id number] Progress Note  [name] [id number] 54 y.o.  Chief Complaint:  I have been more depressed and sad.  My husband does not stop drinking.        History of Present Illness:  [name] came for her followup appointment.  She is complaining of increased depression and anxiety.  She is concerned about her husband who does not stop drinking and had to stop looking for a job.  Recently her husband fell because of drinking and taken to the emergency room and now he is diagnosed with diabetes.  She is complaining of financial strain .  She has no other place to go.  Patient is on disability and she has limited income.  She is taking the medication but sometime she feel it is not helping.  She endorsed crying spells, lack of motivation, racing thoughts and insomnia.  She has no side effects of medication.  She denies any hallucination, paranoia or any anger issues but endorsed some time feeling hopeless and helpless.  She denies any suicidal thoughts.  Her appetite is okay.  Her vitals are stable.  Her last lithium level was 0.50.  Patient lives with her husband.  Patient is seeing Judithe Modest at [facility] for counseling.  Suicidal Ideation: No Plan Formed: No Patient has means to carry out plan: No  Homicidal Ideation: No Plan Formed: No Patient has means to carry out plan: No  Review of Systems  Constitutional: Positive for malaise/fatigue.  Skin: Negative for itching and rash.  Psychiatric/Behavioral: Positive for depression. The patient is nervous/anxious and has insomnia.     Psychiatric: Agitation: No Hallucination: No Depressed Mood: Yes Insomnia: Yes Hypersomnia: No Altered Concentration: No Feels Worthless: No Grandiose Ideas: No Belief In Special Powers: No New/Increased Substance Abuse: No Compulsions: No  Neurologic: Headache: Yes Seizure: No Paresthesias: No  Past Medical History:  Diabetes, thyroid disease, hypertension, asthma,  hypercholesterolemia, obesity. She see PA at [facility].    Alcohol and substance use history. Patient has history of heavy drinking in the past.  She has DWI in the past.  She has not drinking in past 12 months.  Outpatient Encounter Prescriptions as of 10/08/2014  Medication Sig  . albuterol (PROVENTIL HFA;VENTOLIN HFA) 108 (90 BASE) MCG/ACT inhaler Inhale 2 puffs into the lungs as needed.    Marland Kitchen amLODipine (NORVASC) 10 MG tablet   . atorvastatin (LIPITOR) 40 MG tablet   . benztropine (COGENTIN) 1 MG tablet TAKE 1 TABLET BY MOUTH TWICE DAILY AT 8 AM AND 10 PM  . buPROPion (WELLBUTRIN XL) 300 MG 24 hr tablet Take 1 tablet (300 mg total) by mouth every morning.  . cyclobenzaprine (FLEXERIL) 10 MG tablet   . diclofenac (VOLTAREN) 75 MG EC tablet   . fluticasone (FLONASE) 50 MCG/ACT nasal spray   . HUMULIN 70/30 (70-30) 100 UNIT/ML injection   . insulin glargine (LANTUS) 100 UNIT/ML injection Inject 35 Units into the skin at bedtime.    Marland Kitchen levothyroxine (SYNTHROID, LEVOTHROID) 50 MCG tablet   . lithium carbonate (LITHOBID) 300 MG CR tablet Take 1 in AM and 2 at bed time  . metFORMIN (GLUCOPHAGE) 1000 MG tablet   . metoprolol succinate (TOPROL-XL) 50 MG 24 hr tablet   . risperiDONE (RISPERDAL) 3 MG tablet Take 1 tablet (3 mg total) by mouth at bedtime.  . topiramate (TOPAMAX) 100 MG tablet   . traZODone (DESYREL) 150 MG tablet TAKE 1 TABLET BY MOUTH EVERY NIGHT AT BEDTIME  . [DISCONTINUED] benztropine (  COGENTIN) 1 MG tablet TAKE 1 TABLET BY MOUTH TWICE DAILY AT 8 AM AND 10 PM  . [DISCONTINUED] buPROPion (WELLBUTRIN XL) 150 MG 24 hr tablet Take 1 tablet (150 mg total) by mouth daily.  . [DISCONTINUED] lithium carbonate (LITHOBID) 300 MG CR tablet Take 1 in AM and 2 at bed time  . [DISCONTINUED] risperiDONE (RISPERDAL) 3 MG tablet Take 1 tablet (3 mg total) by mouth at bedtime.  . [DISCONTINUED] traZODone (DESYREL) 150 MG tablet TAKE 1 TABLET BY MOUTH EVERY NIGHT AT BEDTIME    Past Psychiatric  History/Hospitalization(s): Patient has at least 4-5 psychiatric hospitalization.  In the past she has tried Cymbalta , Geodon and Navane.  She endorses multiple suicidal attempts and overdose.  She's been seeing psychiatrist at early age.  She saw psychiatrist at [facility] and then [facility].  Her last psychiatric admission was in 2011.  Patient has history of paranoia, hallucination, manic-like symptoms, depression and hallucination. Anxiety: Yes Bipolar Disorder: Yes Depression: Yes Mania: Yes Psychosis: Yes Schizophrenia: No Personality Disorder: No Hospitalization for psychiatric illness: Yes History of Electroconvulsive Shock Therapy: No Prior Suicide Attempts: Yes  No results found for this or any previous visit (from the past 2160 hour(s)).  Physical Exam: Constitutional:  BP 156/76 mmHg  Pulse 76  Ht 5\' 5"  (1.651 m)  Wt 321 lb 3.2 oz (145.695 kg)  BMI 53.45 kg/m2  General Appearance: well nourished and obese  Musculoskeletal: Strength & Muscle Tone: tremors Gait & Station: normal Patient leans: N/A  Mental status examination Patient is obese female who is casually dressed and fairly groomed.  She described her mood sad depressed and her affect is constricted.  Her sleep is slow but clear and coherent.  Her thought process is slow but logical and goal directed.  She denies any auditory or visual hallucination.  There were no paranoia, delusion or any obsessive thoughts.  She has mild tremors.  Her psychomotor activity is decreased.  Her fund of knowledge is average.  She has no memory impairment.  There were no flight of ideas or any loose association.  She is alert and oriented x3.  Her insight judgment and impulse control is okay.  Established Problem, Stable/Improving (1), Review of Psycho-Social Stressors (1), Established Problem, Worsening (2), Review of Last Therapy Session (1), Review of Medication Regimen & Side Effects (2) and Review of New  Medication or Change in Dosage (2)  Assessment: Axis I: Bipolar NOS  Axis II: Deferred  Axis III: Diabetes, thyroid disease, hypertension, asthma, hypercholesterolemia, obesity  Axis IV: Moderate to severe  Axis V: 55   Plan:  Patient started to decompensate slowly.  I review psychosocial stressors.  She is concerned because her husband does not work and a start heavy drinking.  Patient has no other choice to stay with him.  However there has been no physical or mental abuse from him.  I recommended to try Wellbutrin 300 mg daily to help the depression.  At this time I will continue Risperdal 3 mg at bedtime, trazodone 150 mg at bedtime, Cogentin 1 mg twice a day and lithium 300 mg the morning and 600 mg at bedtime.  Her tremors are much improved since Cogentin added.  Recommended to see therapist for coping and social skills.  Discussed medication side effects.  I will see her again in 2 months.  Recommended to call us back if she has any question, concern or if she feels worsening of the symptoms. Time spent 25  minutes.  More than 50% of the time spent in psychoeducation, counseling and coordination of care.  Discuss safety plan that anytime having active suicidal thoughts or homicidal thoughts then patient need to call 911 or go to the local emergency room.   ARFEEN,SYED T., MD 10/08/2014

## 2014-10-14 ENCOUNTER — Telehealth (HOSPITAL_COMMUNITY): Payer: Self-pay

## 2014-10-14 ENCOUNTER — Telehealth (HOSPITAL_COMMUNITY): Payer: Self-pay | Admitting: Psychiatry

## 2014-10-14 DIAGNOSIS — Z79899 Other long term (current) drug therapy: Secondary | ICD-10-CM

## 2014-10-14 NOTE — Telephone Encounter (Signed)
I returned patient's phone call.  She is complaining of increased confusion, dizziness and headaches.  Her Wellbutrin was increased on her last visit.  Patient denies any suicidal thoughts or homicidal thought.  I recommended to cut down her Wellbutrin to 150 mg and we will also get blood work including lithium level.  We will fax orders for blood work to [facility].  I recommended if symptoms does not get better by reducing Wellbutrin then she should go to the emergency room for further evaluation.

## 2014-10-14 NOTE — Telephone Encounter (Signed)
Telephone call with patient to follow up on patient's concern of increased dizziness, confusion and headaches since increase in Wellbutrin 10/08/14.  States started medication increase on Friday 10/11/14 and change in symptoms occurred on Saturday 10/12/14 and persist.  Question what she should do with current medications?  Patient denies suicidal ideations, no homicidal ideations and reports increased slurred speech.  Reports glucose levels have been staying around 200 each day and sees Dr. Arlyce Dice for this.

## 2014-10-15 ENCOUNTER — Telehealth (HOSPITAL_COMMUNITY): Payer: Self-pay | Admitting: *Deleted

## 2014-10-15 NOTE — Telephone Encounter (Signed)
Patients daughter called stating that she went to [facility] in [place] as directed but was unsure of location so went to location on [address] and sign on door stated building was closed.  Patient's daughter stated that she can take her mother to [facility] to have blood work drawn, will just need orders faxed over.  I advised patient's daughter I will try number at location and then speak to nurse, will call her back. Patient's daughter verbalized understanding.

## 2014-10-15 NOTE — Telephone Encounter (Signed)
Called patient's daughter back advised patient's daughter that we can fax the lab orders to [facility]. (I had orders cleared with Dr. Michae Kava( Dr. Lolly Mustache left for the day) and Shawn T., RN.) I advised patient's daughter that her mother can go in the morning to obtain lab work but cannot take her Lithium until after the blood work is done. Patient's daughter advised understanding. I advised patient's daughter to go to first floor in [facility] and go to front desk to check in for labs.  I advised patient's daughter I spoke with Chase in the lab and number to [facility] is [phone], have to be transferred to lab department. Patient's daughter verbalized understanding, will take her mother in the morning.   Fax lab order to [facility] lab at [phone]--Attention [facility]. Copy given to Wilder Glade., RN.

## 2014-10-16 LAB — COMPLETE METABOLIC PANEL WITH GFR
ALT: 17 U/L (ref 0–35)
AST: 15 U/L (ref 0–37)
Albumin: 3.6 g/dL (ref 3.5–5.2)
Alkaline Phosphatase: 76 U/L (ref 39–117)
BUN: 17 mg/dL (ref 6–23)
CHLORIDE: 103 meq/L (ref 96–112)
CO2: 27 mEq/L (ref 19–32)
CREATININE: 0.87 mg/dL (ref 0.50–1.10)
Calcium: 9.3 mg/dL (ref 8.4–10.5)
GFR, EST NON AFRICAN AMERICAN: 76 mL/min
GFR, Est African American: 87 mL/min
Glucose, Bld: 207 mg/dL — ABNORMAL HIGH (ref 70–99)
Potassium: 4.4 mEq/L (ref 3.5–5.3)
Sodium: 136 mEq/L (ref 135–145)
TOTAL PROTEIN: 6.3 g/dL (ref 6.0–8.3)
Total Bilirubin: 0.3 mg/dL (ref 0.2–1.2)

## 2014-10-16 LAB — CBC WITH DIFFERENTIAL/PLATELET
BASOS PCT: 0 % (ref 0–1)
Basophils Absolute: 0 10*3/uL (ref 0.0–0.1)
Eosinophils Absolute: 0.2 10*3/uL (ref 0.0–0.7)
Eosinophils Relative: 3 % (ref 0–5)
HCT: 38 % (ref 36.0–46.0)
HEMOGLOBIN: 12.5 g/dL (ref 12.0–15.0)
LYMPHS ABS: 1.7 10*3/uL (ref 0.7–4.0)
Lymphocytes Relative: 28 % (ref 12–46)
MCH: 28.7 pg (ref 26.0–34.0)
MCHC: 32.9 g/dL (ref 30.0–36.0)
MCV: 87.2 fL (ref 78.0–100.0)
MONO ABS: 0.4 10*3/uL (ref 0.1–1.0)
MONOS PCT: 6 % (ref 3–12)
MPV: 9.1 fL (ref 8.6–12.4)
NEUTROS ABS: 3.7 10*3/uL (ref 1.7–7.7)
Neutrophils Relative %: 63 % (ref 43–77)
Platelets: 306 10*3/uL (ref 150–400)
RBC: 4.36 MIL/uL (ref 3.87–5.11)
RDW: 13.6 % (ref 11.5–15.5)
WBC: 5.9 10*3/uL (ref 4.0–10.5)

## 2014-10-16 LAB — LITHIUM LEVEL: Lithium Lvl: 0.6 mEq/L — ABNORMAL LOW (ref 0.80–1.40)

## 2014-10-16 LAB — TSH: TSH: 4.395 u[IU]/mL (ref 0.350–4.500)

## 2014-10-17 ENCOUNTER — Telehealth (HOSPITAL_COMMUNITY): Payer: Self-pay

## 2014-10-17 LAB — HEMOGLOBIN A1C
HEMOGLOBIN A1C: 11 % — AB (ref <5.7)
MEAN PLASMA GLUCOSE: 269 mg/dL — AB (ref <117)

## 2014-10-17 NOTE — Telephone Encounter (Signed)
Telephone call with patient to review concerns for her received labs today, particularly her glucose fasting level and HgbA1c of 11.0.  Requested patient follow up with Dr. Talmage Coin, her endocrinologist and labs faxed to their office.  Patient agreed to follow up with them as soon as possible.  Informed patient this nurse would send a message to Dr. Lolly Mustache about concerns and if he wanted any medication changes with our office he could get back to her.  At present lithium level 0.6 so is slightly low but not the primary concern.  Patient has an appointment with Dr. Sharl Ma 10/28/14.

## 2014-10-20 ENCOUNTER — Other Ambulatory Visit: Payer: Self-pay | Admitting: Psychiatry

## 2014-11-05 ENCOUNTER — Other Ambulatory Visit (HOSPITAL_COMMUNITY): Payer: Self-pay | Admitting: Psychiatry

## 2014-11-05 NOTE — Telephone Encounter (Signed)
Bupropion refill refused to [facility] pharmacy in [place] as a new order was e-scribed into their pharmacy on 10/08/14 with one refill.

## 2014-11-20 ENCOUNTER — Telehealth (HOSPITAL_COMMUNITY): Payer: Self-pay | Admitting: *Deleted

## 2014-11-21 NOTE — Telephone Encounter (Signed)
Telephone call with patient after receiving a faxed form from [facility] pharmacy # [phone] and fax # [phone] requesting patient's currently prescribed medications from Dr. Lolly Mustache be written for 90 day supplies; Benztropine, Lithium Carbonate, and Risperdone and faxed to their pharmacy at [phone].  Patient reported she currently had enough of all medications until she sees Dr. Lolly Mustache again on 12/09/14 and would like to begin this after next evaluation.

## 2014-12-09 ENCOUNTER — Ambulatory Visit (INDEPENDENT_AMBULATORY_CARE_PROVIDER_SITE_OTHER): Payer: Medicare HMO | Admitting: Psychiatry

## 2014-12-09 ENCOUNTER — Ambulatory Visit (HOSPITAL_COMMUNITY): Payer: Self-pay | Admitting: Psychiatry

## 2014-12-09 ENCOUNTER — Encounter (HOSPITAL_COMMUNITY): Payer: Self-pay | Admitting: Psychiatry

## 2014-12-09 VITALS — BP 140/83 | HR 72 | Ht 63.38 in | Wt 322.4 lb

## 2014-12-09 DIAGNOSIS — F3130 Bipolar disorder, current episode depressed, mild or moderate severity, unspecified: Secondary | ICD-10-CM

## 2014-12-09 DIAGNOSIS — F3162 Bipolar disorder, current episode mixed, moderate: Secondary | ICD-10-CM

## 2014-12-09 DIAGNOSIS — F319 Bipolar disorder, unspecified: Secondary | ICD-10-CM

## 2014-12-09 MED ORDER — LITHIUM CARBONATE ER 300 MG PO TBCR: EXTENDED_RELEASE_TABLET | ORAL | Status: DC

## 2014-12-09 MED ORDER — BUPROPION HCL ER (XL) 150 MG PO TB24: 150.0000 mg | ORAL_TABLET | Freq: Every morning | ORAL | Status: DC

## 2014-12-09 MED ORDER — BENZTROPINE MESYLATE 1 MG PO TABS: ORAL_TABLET | ORAL | Status: DC

## 2014-12-09 MED ORDER — RISPERIDONE 2 MG PO TABS: 2.0000 mg | ORAL_TABLET | Freq: Every day | ORAL | Status: DC

## 2014-12-09 MED ORDER — TRAZODONE HCL 150 MG PO TABS: ORAL_TABLET | ORAL | Status: DC

## 2014-12-09 NOTE — Progress Notes (Signed)
[facility] [phone] Progress Note  [name] [id number] 54 y.o.  Chief Complaint:  I saw my endocrinologist and my insulin dose is increased.    History of Present Illness:  [name] came for her followup appointment.  On her last visit we tried Wellbutrin XL 300 mg patient did not tolerate well and now she is taking one 50 mg.  She is complaining of feeling tired, some time dizziness, decreased energy and recently seen her endocrinologist.  Her hemoglobin A1c was 11.  Her insulin dose was increased.  She also noticed some time forgetful and she was told that she needed to control her blood sugar .  She admitted increased stress at home because her husband continues to drink and does not listen to her .  Her husband lost his job.  She endorse financial strain.  She cannot leave her husband because she has no other choice.  She admitted some time crying spells but denies any active or passive suicidal parts or homicidal thought.  She denies any anger, mood swing or any irritability.  She had missed appointment to see her therapist because she was feeling sick .  She is compliant with her lithium and her last lithium level 0.60.  Patient denies drinking or using any illegal substances.  She has no tremors, shakes or any side effects.  Her appetite is okay.  Her vitals are stable.  Suicidal Ideation: No Plan Formed: No Patient has means to carry out plan: No  Homicidal Ideation: No Plan Formed: No Patient has means to carry out plan: No  Review of Systems  Constitutional: Positive for malaise/fatigue.  Neurological: Positive for dizziness and headaches.  Psychiatric/Behavioral: Positive for substance abuse. Negative for suicidal ideas. The patient has insomnia.        Memory impairment    Psychiatric: Agitation: No Hallucination: No Depressed Mood: Yes Insomnia: Yes Hypersomnia: No Altered Concentration: No Feels Worthless: No Grandiose Ideas: No Belief In Special  Powers: No New/Increased Substance Abuse: No Compulsions: No  Neurologic: Headache: Yes Seizure: No Paresthesias: No  Past Medical History:  Diabetes, thyroid disease, hypertension, asthma, hypercholesterolemia, obesity. She see Debarah Crape PA at [facility].  Her endocrinologist is Dr. Dellis Filbert.  Alcohol and substance use history. Patient has history of heavy drinking in the past.  She has DWI in the past.  She has not drinking in past 12 months.  Outpatient Encounter Prescriptions as of 12/09/2014  Medication Sig  . albuterol (PROVENTIL HFA;VENTOLIN HFA) 108 (90 BASE) MCG/ACT inhaler Inhale 2 puffs into the lungs as needed.    Marland Kitchen amLODipine (NORVASC) 10 MG tablet   . benztropine (COGENTIN) 1 MG tablet TAKE 1 TABLET BY MOUTH TWICE DAILY AT 8 AM AND 10 PM  . buPROPion (WELLBUTRIN XL) 150 MG 24 hr tablet Take 1 tablet (150 mg total) by mouth every morning.  . diclofenac (VOLTAREN) 75 MG EC tablet   . fluticasone (FLONASE) 50 MCG/ACT nasal spray   . HUMULIN 70/30 (70-30) 100 UNIT/ML injection   . insulin glargine (LANTUS) 100 UNIT/ML injection Inject 35 Units into the skin at bedtime.    Marland Kitchen levothyroxine (SYNTHROID, LEVOTHROID) 50 MCG tablet   . lithium carbonate (LITHOBID) 300 MG CR tablet Take 1 in AM and 2 at bed time  . metFORMIN (GLUCOPHAGE) 1000 MG tablet   . metoprolol succinate (TOPROL-XL) 50 MG 24 hr tablet   . risperiDONE (RISPERDAL) 2 MG tablet Take 1 tablet (2 mg total) by mouth at bedtime.  Marland Kitchen  topiramate (TOPAMAX) 100 MG tablet   . traZODone (DESYREL) 150 MG tablet TAKE 1 TABLET BY MOUTH EVERY NIGHT AT BEDTIME  . [DISCONTINUED] atorvastatin (LIPITOR) 40 MG tablet   . [DISCONTINUED] benztropine (COGENTIN) 1 MG tablet TAKE 1 TABLET BY MOUTH TWICE DAILY AT 8 AM AND 10 PM  . [DISCONTINUED] buPROPion (WELLBUTRIN XL) 300 MG 24 hr tablet Take 1 tablet (300 mg total) by mouth every morning.  . [DISCONTINUED] cyclobenzaprine (FLEXERIL) 10 MG tablet   . [DISCONTINUED] lithium  carbonate (LITHOBID) 300 MG CR tablet Take 1 in AM and 2 at bed time  . [DISCONTINUED] risperiDONE (RISPERDAL) 3 MG tablet Take 1 tablet (3 mg total) by mouth at bedtime.  . [DISCONTINUED] traZODone (DESYREL) 150 MG tablet TAKE 1 TABLET BY MOUTH EVERY NIGHT AT BEDTIME  . atorvastatin (LIPITOR) 80 MG tablet   . methocarbamol (ROBAXIN) 500 MG tablet     Past Psychiatric History/Hospitalization(s): Patient has at least 4-5 psychiatric hospitalization.  In the past she has tried Cymbalta , Geodon and Navane.  She endorses multiple suicidal attempts and overdose.  She's been seeing psychiatrist at early age.  She saw psychiatrist at [facility] and then [facility].  Her last psychiatric admission was in 2011.  Patient has history of paranoia, hallucination, manic-like symptoms, depression and hallucination. Anxiety: Yes Bipolar Disorder: Yes Depression: Yes Mania: Yes Psychosis: Yes Schizophrenia: No Personality Disorder: No Hospitalization for psychiatric illness: Yes History of Electroconvulsive Shock Therapy: No Prior Suicide Attempts: Yes  Recent Results (from the past 2160 hour(s))  CBC with Differential     Status: None   Collection Time: 10/14/14  4:31 PM  Result Value Ref Range   WBC 5.9 4.0 - 10.5 K/uL   RBC 4.36 3.87 - 5.11 MIL/uL   Hemoglobin 12.5 12.0 - 15.0 g/dL   HCT 38.0 36.0 - 46.0 %   MCV 87.2 78.0 - 100.0 fL   MCH 28.7 26.0 - 34.0 pg   MCHC 32.9 30.0 - 36.0 g/dL   RDW 13.6 11.5 - 15.5 %   Platelets 306 150 - 400 K/uL   MPV 9.1 8.6 - 12.4 fL    Comment: ** Please note change in reference range(s). **   Neutrophils Relative % 63 43 - 77 %   Neutro Abs 3.7 1.7 - 7.7 K/uL   Lymphocytes Relative 28 12 - 46 %   Lymphs Abs 1.7 0.7 - 4.0 K/uL   Monocytes Relative 6 3 - 12 %   Monocytes Absolute 0.4 0.1 - 1.0 K/uL   Eosinophils Relative 3 0 - 5 %   Eosinophils Absolute 0.2 0.0 - 0.7 K/uL   Basophils Relative 0 0 - 1 %   Basophils Absolute 0.0 0.0 - 0.1  K/uL   Smear Review Criteria for review not met   Lithium level     Status: Abnormal   Collection Time: 10/14/14  4:31 PM  Result Value Ref Range   Lithium Lvl 0.60 (L) 0.80 - 1.40 mEq/L  TSH     Status: None   Collection Time: 10/14/14  4:31 PM  Result Value Ref Range   TSH 4.395 0.350 - 4.500 uIU/mL  COMPLETE METABOLIC PANEL WITH GFR     Status: Abnormal   Collection Time: 10/14/14  4:31 PM  Result Value Ref Range   Sodium 136 135 - 145 mEq/L   Potassium 4.4 3.5 - 5.3 mEq/L   Chloride 103 96 - 112 mEq/L   CO2 27 19 - 32 mEq/L  Glucose, Bld 207 (H) 70 - 99 mg/dL   BUN 17 6 - 23 mg/dL   Creat 0.87 0.50 - 1.10 mg/dL   Total Bilirubin 0.3 0.2 - 1.2 mg/dL   Alkaline Phosphatase 76 39 - 117 U/L   AST 15 0 - 37 U/L   ALT 17 0 - 35 U/L   Total Protein 6.3 6.0 - 8.3 g/dL   Albumin 3.6 3.5 - 5.2 g/dL   Calcium 9.3 8.4 - 10.5 mg/dL   GFR, Est African American 87 mL/min   GFR, Est Non African American 76 mL/min    Comment:   The estimated GFR is a calculation valid for adults (>=44 years old) that uses the CKD-EPI algorithm to adjust for age and sex. It is   not to be used for children, pregnant women, hospitalized patients,    patients on dialysis, or with rapidly changing kidney function. According to the NKDEP, eGFR >89 is normal, 60-89 shows mild impairment, 30-59 shows moderate impairment, 15-29 shows severe impairment and <15 is ESRD.     Hemoglobin A1c     Status: Abnormal   Collection Time: 10/14/14  4:31 PM  Result Value Ref Range   Hgb A1c MFr Bld 11.0 (H) <5.7 %    Comment:                                                                        According to the ADA Clinical Practice Recommendations for 2011, when HbA1c is used as a screening test:     >=6.5%   Diagnostic of Diabetes Mellitus            (if abnormal result is confirmed)   5.7-6.4%   Increased risk of developing Diabetes Mellitus   References:Diagnosis and Classification of Diabetes  Mellitus,Diabetes WJXB,1478,29(FAOZH 1):S62-S69 and Standards of Medical Care in         Diabetes - 2011,Diabetes Care,2011,34 (Suppl 1):S11-S61.      Mean Plasma Glucose 269 (H) <117 mg/dL    Physical Exam: Constitutional:  BP 140/83 mmHg  Pulse 72  Ht 5' 3.38" (1.61 m)  Wt 322 lb 6.4 oz (146.24 kg)  BMI 56.42 kg/m2  General Appearance: well nourished and obese  Musculoskeletal: Strength & Muscle Tone: tremors Gait & Station: normal Patient leans: N/A  Mental status examination Patient is obese female who is casually dressed and fairly groomed.  She described her mood sad depressed and her affect is constricted.  Her speech is slow but coherent.  Her attention and concentration is fair.  She has difficulty remembering things.  Her thought process logical and goal-directed.  She denies any auditory or visual hallucination.  She denies any active or passive suicidal parts or homicidal thought.  There were no delusions, paranoia or any obsessive thoughts.  Her fund of knowledge is average.  Her psychomotor activity is slow.  She is alert and oriented 3 however she has difficulty remembering things.  She has mild tremors in her hand however there were no EPS.  Her insight judgment and impulse control is okay.  Established Problem, Stable/Improving (1), Review of Psycho-Social Stressors (1), Decision to obtain old records (1), Review and summation of old records (2), Review of Last Therapy Session (1),  Review of Medication Regimen & Side Effects (2) and Review of New Medication or Change in Dosage (2)  Assessment: Axis I: Bipolar NOS  Axis II: Deferred  Axis III: Diabetes, thyroid disease, hypertension, asthma, hypercholesterolemia, obesity  Plan:  I review her blood work results.  Her hemoglobin A1c is 11 and her lithium level is 0.60.  Her basic chemistry is normal except that her blood sugar was 207.  Her CBC is normal.  Her TSH is also normal.  I emphasize to control her blood  sugar which could be causing dizziness, lack of energy and lack of motivation.  I encourage her to keep appointment with Richardo Priest for counseling.  I will reduce Risperdal 2 mg to help her blood pressure come down.  She tried Wellbutrin 300 however she did not tolerate very well.  We will cut down to 150 which she used to take.  Patient requires a 90 day prescription from her insurance.  We'll continue lithium 300 mg in the morning and 600 mg at bedtime, and new dose of Risperdal 2 mg at bedtime, Wellbutrin XL 150 mg daily, Cogentin 1 mg twice a day and trazodone 150 mg at bedtime.  Discussed medication side effects and benefits.  Recommended to call us back if she has any question, concern if she feels worsening of the symptom.  Follow-up in 3 months Time spent 25 minutes.  More than 50% of the time spent in psychoeducation, counseling and coordination of care.  Discuss safety plan that anytime having active suicidal thoughts or homicidal thoughts then patient need to call 911 or go to the local emergency room.   Saree Krogh T., MD 12/09/2014

## 2015-01-08 ENCOUNTER — Ambulatory Visit (INDEPENDENT_AMBULATORY_CARE_PROVIDER_SITE_OTHER): Payer: Medicare HMO | Admitting: Licensed Clinical Social Worker

## 2015-01-08 DIAGNOSIS — F3181 Bipolar II disorder: Secondary | ICD-10-CM

## 2015-02-05 ENCOUNTER — Ambulatory Visit (INDEPENDENT_AMBULATORY_CARE_PROVIDER_SITE_OTHER): Payer: Medicare HMO | Admitting: Licensed Clinical Social Worker

## 2015-02-05 DIAGNOSIS — F3181 Bipolar II disorder: Secondary | ICD-10-CM | POA: Diagnosis not present

## 2015-03-11 ENCOUNTER — Ambulatory Visit (INDEPENDENT_AMBULATORY_CARE_PROVIDER_SITE_OTHER): Payer: Medicare HMO | Admitting: Psychiatry

## 2015-03-11 ENCOUNTER — Encounter (HOSPITAL_COMMUNITY): Payer: Self-pay | Admitting: Psychiatry

## 2015-03-11 VITALS — BP 148/84 | HR 72 | Ht 64.0 in | Wt 324.8 lb

## 2015-03-11 DIAGNOSIS — F3162 Bipolar disorder, current episode mixed, moderate: Secondary | ICD-10-CM

## 2015-03-11 DIAGNOSIS — F3130 Bipolar disorder, current episode depressed, mild or moderate severity, unspecified: Secondary | ICD-10-CM | POA: Diagnosis not present

## 2015-03-11 MED ORDER — BUPROPION HCL ER (XL) 150 MG PO TB24: 150.0000 mg | ORAL_TABLET | Freq: Every morning | ORAL | Status: DC

## 2015-03-11 MED ORDER — LITHIUM CARBONATE ER 300 MG PO TBCR: EXTENDED_RELEASE_TABLET | ORAL | Status: DC

## 2015-03-11 MED ORDER — RISPERIDONE 2 MG PO TABS: 2.0000 mg | ORAL_TABLET | Freq: Every day | ORAL | Status: DC

## 2015-03-11 MED ORDER — TRAZODONE HCL 150 MG PO TABS: ORAL_TABLET | ORAL | Status: DC

## 2015-03-11 MED ORDER — BENZTROPINE MESYLATE 1 MG PO TABS: ORAL_TABLET | ORAL | Status: DC

## 2015-03-11 NOTE — Progress Notes (Signed)
[facility] [address] Progress Note  [name] [id number] 54 y.o.  Chief Complaint:  Medication management and follow-up.      History of Present Illness:  [name] came for her followup appointment.  She is taking her medication as prescribed and denies any side effects.  She is checking her blood sugars since her insulin dose was increased 3 months ago.  She reported improvement in her blood sugar.  She still feel anxious and nervous due to her living situation.  Her husband continues to drink and does not listen to her.  Patient told that she cannot leave her husband because she has no other choice.  Sometimes she complained poor sleep and racing thoughts but overall she's been handling the situation better.  She is seeing [name] on a regular basis.  She feels much improvement since she is seeing therapist on a regular basis.  Patient denies any suicidal thoughts or homicidal thought.  She denies any agitation, anger or any feeling of hopelessness or worthlessness.  She wants to continue her current medication.  Patient denies drinking or using any illegal substances.  She still have some headaches and she is going to talk to her primary care physician if Topamax dose can be further increase.  Patient denies any tremors, shakes or any EPS.  Her appetite is okay.  Her vitals are stable.  Suicidal Ideation: No Plan Formed: No Patient has means to carry out plan: No  Homicidal Ideation: No Plan Formed: No Patient has means to carry out plan: No  Review of Systems  Constitutional: Positive for malaise/fatigue.  Neurological: Positive for headaches.  Psychiatric/Behavioral: Negative for suicidal ideas.    Psychiatric: Agitation: No Hallucination: No Depressed Mood: Yes Insomnia: Yes Hypersomnia: No Altered Concentration: No Feels Worthless: No Grandiose Ideas: No Belief In Special Powers: No New/Increased Substance Abuse: No Compulsions:  No  Neurologic: Headache: Yes Seizure: No Paresthesias: No  Past Medical History:  Diabetes, thyroid disease, hypertension, asthma, hypercholesterolemia, obesity. She see Maria Coyer PA at [facility].  Her endocrinologist is Dr. Tinnie Alvarado.  Alcohol and substance use history. Patient has history of heavy drinking in the past.  She has DWI in the past.  She has not drinking in past 12 months.  Outpatient Encounter Prescriptions as of 03/11/2015  Medication Sig  . albuterol (PROVENTIL HFA;VENTOLIN HFA) 108 (90 BASE) MCG/ACT inhaler Inhale 2 puffs into the lungs as needed.    Marland Kitchen amLODipine (NORVASC) 10 MG tablet   . atorvastatin (LIPITOR) 80 MG tablet   . benztropine (COGENTIN) 1 MG tablet TAKE 1 TABLET BY MOUTH TWICE DAILY AT 8 AM AND 10 PM  . buPROPion (WELLBUTRIN XL) 150 MG 24 hr tablet Take 1 tablet (150 mg total) by mouth every morning.  . diclofenac (VOLTAREN) 75 MG EC tablet   . fluticasone (FLONASE) 50 MCG/ACT nasal spray   . HUMULIN 70/30 (70-30) 100 UNIT/ML injection   . insulin glargine (LANTUS) 100 UNIT/ML injection Inject 35 Units into the skin at bedtime.    Marland Kitchen levothyroxine (SYNTHROID, LEVOTHROID) 50 MCG tablet   . lithium carbonate (LITHOBID) 300 MG CR tablet Take 1 in AM and 2 at bed time  . metFORMIN (GLUCOPHAGE) 1000 MG tablet   . methocarbamol (ROBAXIN) 500 MG tablet   . metoprolol succinate (TOPROL-XL) 50 MG 24 hr tablet   . risperiDONE (RISPERDAL) 2 MG tablet Take 1 tablet (2 mg total) by mouth at bedtime.  . topiramate (TOPAMAX) 100 MG tablet   . traZODone (  DESYREL) 150 MG tablet TAKE 1 TABLET BY MOUTH EVERY NIGHT AT BEDTIME  . [DISCONTINUED] benztropine (COGENTIN) 1 MG tablet TAKE 1 TABLET BY MOUTH TWICE DAILY AT 8 AM AND 10 PM  . [DISCONTINUED] buPROPion (WELLBUTRIN XL) 150 MG 24 hr tablet Take 1 tablet (150 mg total) by mouth every morning.  . [DISCONTINUED] lithium carbonate (LITHOBID) 300 MG CR tablet Take 1 in AM and 2 at bed time  . [DISCONTINUED] risperiDONE  (RISPERDAL) 2 MG tablet Take 1 tablet (2 mg total) by mouth at bedtime.  . [DISCONTINUED] traZODone (DESYREL) 150 MG tablet TAKE 1 TABLET BY MOUTH EVERY NIGHT AT BEDTIME   No facility-administered encounter medications on file as of 03/11/2015.    Past Psychiatric History/Hospitalization(s): Patient has at least 4-5 psychiatric hospitalization.  In the past she has tried Cymbalta , Geodon and Navane.  She endorses multiple suicidal attempts and overdose.  She's been seeing psychiatrist at early age.  She saw psychiatrist at [facility] and then [facility] mental [facility].  Her last psychiatric admission was in 2011.  Patient has history of paranoia, hallucination, manic-like symptoms, depression and hallucination. Anxiety: Yes Bipolar Disorder: Yes Depression: Yes Mania: Yes Psychosis: Yes Schizophrenia: No Personality Disorder: No Hospitalization for psychiatric illness: Yes History of Electroconvulsive Shock Therapy: No Prior Suicide Attempts: Yes  No results found for this or any previous visit (from the past 2160 hour(s)).  Physical Exam: Constitutional:  BP 148/84 mmHg  Pulse 72  Ht 5\' 4"  (1.626 m)  Wt 324 lb 12.8 oz (147.328 kg)  BMI 55.72 kg/m2  General Appearance: well nourished and obese  Musculoskeletal: Strength & Muscle Tone: tremors Gait & Station: normal Patient leans: N/A  Mental status examination Patient is obese female who is casually dressed and fairly groomed.  She described her mood euthymic and her affect is constricted.  Her speech is slow but coherent.  Her attention and concentration is fair.  Her thought process logical and goal-directed.  She denies any auditory or visual hallucination.  She denies any active or passive suicidal thoughts or homicidal thought.  There were no delusions, paranoia or any obsessive thoughts.  Her fund of knowledge is average.  Her psychomotor activity is slow.  She is alert and oriented 3 however she has difficulty  remembering things.  She has mild tremors in her hand however there were no EPS.  Her insight judgment and impulse control is okay.  Established Problem, Stable/Improving (1), Review of Psycho-Social Stressors (1), Review of Last Therapy Session (1) and Review of Medication Regimen & Side Effects (2)  Assessment: Axis I: Bipolar NOS  Axis II: Deferred  Axis III: Diabetes, thyroid disease, hypertension, asthma, hypercholesterolemia, obesity  Plan:  Patient is a stable on her current medication.  She is seeing therapist for her coping skills.  She does not want to change her cut down her psycho topic medication.  I encourage to see her primary care physician for her health needs and discussed regularly check her blood sugar and watch her calories intake and to regular exercise.  I will continue lithium 300 mg in the morning and 600 mg at bedtime, Risperdal 2 mg at bedtime, Wellbutrin XL 150 mg daily, Cogentin 1 mg twice a day and trazodone 150 mg at bedtime.  Discussed medication side effects and benefits.  Recommended to call back if she has any question, concern if she feels worsening of the symptom.  Follow-up in 3 months  Daliana Leverett T., MD 03/11/2015

## 2015-03-12 ENCOUNTER — Ambulatory Visit: Payer: No Typology Code available for payment source | Admitting: Licensed Clinical Social Worker

## 2015-04-28 ENCOUNTER — Other Ambulatory Visit (HOSPITAL_COMMUNITY): Payer: Self-pay | Admitting: Psychiatry

## 2015-04-29 ENCOUNTER — Other Ambulatory Visit (HOSPITAL_COMMUNITY): Payer: Self-pay | Admitting: Psychiatry

## 2015-04-29 ENCOUNTER — Encounter (INDEPENDENT_AMBULATORY_CARE_PROVIDER_SITE_OTHER): Payer: Self-pay | Admitting: *Deleted

## 2015-04-29 NOTE — Telephone Encounter (Signed)
Given 90 days supply on June 14.  Too soon to refill

## 2015-06-11 ENCOUNTER — Ambulatory Visit (HOSPITAL_COMMUNITY): Payer: Self-pay | Admitting: Psychiatry

## 2015-06-13 ENCOUNTER — Encounter (HOSPITAL_COMMUNITY): Payer: Self-pay | Admitting: Psychiatry

## 2015-06-13 ENCOUNTER — Ambulatory Visit (INDEPENDENT_AMBULATORY_CARE_PROVIDER_SITE_OTHER): Payer: Medicare HMO | Admitting: Psychiatry

## 2015-06-13 VITALS — BP 140/80 | HR 92 | Ht 64.0 in | Wt 322.0 lb

## 2015-06-13 DIAGNOSIS — F3160 Bipolar disorder, current episode mixed, unspecified: Secondary | ICD-10-CM | POA: Diagnosis not present

## 2015-06-13 DIAGNOSIS — F3130 Bipolar disorder, current episode depressed, mild or moderate severity, unspecified: Secondary | ICD-10-CM

## 2015-06-13 DIAGNOSIS — F3162 Bipolar disorder, current episode mixed, moderate: Secondary | ICD-10-CM

## 2015-06-13 MED ORDER — ZIPRASIDONE HCL 60 MG PO CAPS: ORAL_CAPSULE | ORAL | Status: DC

## 2015-06-13 MED ORDER — TRAZODONE HCL 150 MG PO TABS: ORAL_TABLET | ORAL | Status: DC

## 2015-06-13 MED ORDER — LITHIUM CARBONATE ER 300 MG PO TBCR: EXTENDED_RELEASE_TABLET | ORAL | Status: DC

## 2015-06-13 MED ORDER — BUPROPION HCL ER (XL) 150 MG PO TB24: 150.0000 mg | ORAL_TABLET | Freq: Every morning | ORAL | Status: DC

## 2015-06-13 NOTE — Progress Notes (Signed)
[facility] [address] Progress Note  [name] [id number] 54 y.o.  Chief Complaint:  I am under a lot of stress.  My husband does not stop drinking and he has lot of health issues.  I cannot afford counseling.  I stop taking insulin because I cannot afford.        History of Present Illness:  [name] came for her followup appointment.  She is complaining of increased anxiety and distress.  She is concerned about her own physical health and her husband's physical health.  Her husband continues to drink and lately he has some health issues.  Patient also endorsed financial burden and lately she has been not taking her insulin.  Patient has history of diabetes and her last hemoglobin A1c was 11.  She is also not seeing therapist [name] because of financial reasons.  She is unable to afford the co-pay.  She admitted sometime feeling tired, fatigued and having crying spells.  Though she denies any suicidal thoughts or homicidal thought but admitted more isolated, withdrawn and feeling overwhelmed.  She is not drinking or using any illegal substances.  She denies any hallucination or any paranoia.  She admitted having headaches and getting Topamax from her primary care physician.  She scheduled to see endocrinologist next week but she is afraid her blood sugar will be high.  She is taking her lithium, Risperdal and Wellbutrin.  She has no tremors, shakes or any EPS.  She endorse her appetite is okay.  Her vitals are stable.  Suicidal Ideation: No Plan Formed: No Patient has means to carry out plan: No  Homicidal Ideation: No Plan Formed: No Patient has means to carry out plan: No  Review of Systems  Constitutional: Positive for malaise/fatigue.  Respiratory: Negative.   Musculoskeletal: Negative.   Skin: Negative for itching and rash.  Neurological: Positive for headaches. Negative for dizziness and tremors.  Psychiatric/Behavioral: Positive for depression. Negative for suicidal  ideas. The patient is nervous/anxious and has insomnia.     Psychiatric: Agitation: No Hallucination: No Depressed Mood: Yes Insomnia: Yes Hypersomnia: No Altered Concentration: No Feels Worthless: No Grandiose Ideas: No Belief In Special Powers: No New/Increased Substance Abuse: No Compulsions: No  Neurologic: Headache: Yes Seizure: No Paresthesias: No  Past Medical History:  Diabetes, thyroid disease, hypertension, asthma, hypercholesterolemia, obesity. She see Maria Coyer PA at [facility].  Her endocrinologist is Dr. Tinnie Alvarado.  Outpatient Encounter Prescriptions as of 06/13/2015  Medication Sig  . albuterol (PROVENTIL HFA;VENTOLIN HFA) 108 (90 BASE) MCG/ACT inhaler Inhale 2 puffs into the lungs as needed.    Marland Kitchen amLODipine (NORVASC) 10 MG tablet   . atorvastatin (LIPITOR) 80 MG tablet   . buPROPion (WELLBUTRIN XL) 150 MG 24 hr tablet Take 1 tablet (150 mg total) by mouth every morning.  . diclofenac (VOLTAREN) 75 MG EC tablet   . fluticasone (FLONASE) 50 MCG/ACT nasal spray   . HUMULIN 70/30 (70-30) 100 UNIT/ML injection   . insulin glargine (LANTUS) 100 UNIT/ML injection Inject 35 Units into the skin at bedtime.    Marland Kitchen levothyroxine (SYNTHROID, LEVOTHROID) 50 MCG tablet   . lithium carbonate (LITHOBID) 300 MG CR tablet Take 1 in AM and 2 at bed time  . metFORMIN (GLUCOPHAGE) 1000 MG tablet   . methocarbamol (ROBAXIN) 500 MG tablet   . metoprolol succinate (TOPROL-XL) 50 MG 24 hr tablet   . topiramate (TOPAMAX) 100 MG tablet   . traZODone (DESYREL) 150 MG tablet TAKE 1 TABLET BY MOUTH EVERY  NIGHT AT BEDTIME  . ziprasidone (GEODON) 60 MG capsule Take 1 capsule for 1 week and than 2 capsule at bed time  . [DISCONTINUED] benztropine (COGENTIN) 1 MG tablet TAKE 1 TABLET BY MOUTH TWICE DAILY AT 8 AM AND 10 PM  . [DISCONTINUED] buPROPion (WELLBUTRIN XL) 150 MG 24 hr tablet Take 1 tablet (150 mg total) by mouth every morning.  . [DISCONTINUED] lithium carbonate (LITHOBID) 300 MG CR  tablet Take 1 in AM and 2 at bed time  . [DISCONTINUED] risperiDONE (RISPERDAL) 2 MG tablet Take 1 tablet (2 mg total) by mouth at bedtime.  . [DISCONTINUED] traZODone (DESYREL) 150 MG tablet TAKE 1 TABLET BY MOUTH EVERY NIGHT AT BEDTIME   No facility-administered encounter medications on file as of 06/13/2015.    Past Psychiatric History/Hospitalization(s): Patient has at least 4-5 psychiatric hospitalization.  In the past she has tried Cymbalta , Geodon and Navane.  She endorses multiple suicidal attempts and overdose.  She's been seeing psychiatrist at early age.  She saw psychiatrist at [facility] and then [facility].  Her last psychiatric admission was in 2011.  Patient has history of paranoia, hallucination, manic-like symptoms, depression and hallucination.  She used to have a heavy drinking in her teenage years and she has DWI in the past. Anxiety: Yes Bipolar Disorder: Yes Depression: Yes Mania: Yes Psychosis: Yes Schizophrenia: No Personality Disorder: No Hospitalization for psychiatric illness: Yes History of Electroconvulsive Shock Therapy: No Prior Suicide Attempts: Yes  No results found for this or any previous visit (from the past 2160 hour(s)).  Physical Exam: Constitutional:  BP 140/80 mmHg  Pulse 92  Ht 5\' 4"  (1.626 m)  Wt 322 lb (146.058 kg)  BMI 55.24 kg/m2  General Appearance: well nourished and obese  Musculoskeletal: Strength & Muscle Tone: tremors Gait & Station: normal Patient leans: N/A  Mental status examination Patient is obese female who is casually dressed and fairly groomed.  She described her mood sad and depressed.  Her affect is constricted.  Her speech is slow with decreased volume and tone.  Her attention and concentration is fair.  Her thought process logical and goal-directed.  She denies any auditory or visual hallucination.  She denies any active or passive suicidal thoughts or homicidal thought.  There were no delusions,  paranoia or any obsessive thoughts.  Her fund of knowledge is average.  Her psychomotor activity is slow.  She is alert and oriented 3 however she has difficulty remembering things.  She has mild tremors in her hand however there were no EPS.  Her insight judgment and impulse control is okay.  Established Problem, Stable/Improving (1), Review of Psycho-Social Stressors (1), Review and summation of old records (2), Established Problem, Worsening (2), New Problem, with no additional work-up planned (3), Review of Last Therapy Session (1), Review of Medication Regimen & Side Effects (2) and Review of New Medication or Change in Dosage (2)  Assessment: Axis I: Bipolar disorder, mixed  Axis II: Deferred  Axis III: Diabetes, thyroid disease, hypertension, asthma, hypercholesterolemia, obesity  Plan:  I review her previous notes and collateral information.  Her last hemoglobin A1c was very high and now she stopped taking her insulin which is concerning.  Patient mentioned due to finances she is unable to afford insulin and therapy.  I recommended to try Geodon which she had taken in the past but at that time it was a brand name and she could not afford.  She remember having a good response in  the past with Geodon which helps her depression.  Recommended to decrease Risperdal to 1 mg per 1 week and a start Geodon 60 mg and after one week's discontinue Risperdal and take Geodon 120 mg at bedtime.  Discussed potential risk and benefits especially given the fact that she has diabetes Risperdal may be contributing to high blood sugar .  I will also discontinue Cogentin since she do not recall having any EPS with Geodon.  She will continue Wellbutrin XL 150 mg daily, trazodone 150 mg at bedtime and lithium 300 mg in the morning and 600 mg at bedtime.  Discussed medication side effects and benefits.  Discuss safety plan that anytime having active suicidal thoughts or homicidal thoughts and she need to call 911 or go  to the local emergency room.  Encouraged to restart taking insulin as hyperglycemia can cause worsening of her physical condition.  Patient is scheduled to see her endocrinologist next week.  Recommended to call us back if she has any question or any concern.  Follow-up in 4 weeks.  ARFEEN,SYED T., MD 06/13/2015

## 2015-06-16 ENCOUNTER — Telehealth (HOSPITAL_COMMUNITY): Payer: Self-pay

## 2015-06-16 NOTE — Telephone Encounter (Signed)
Medication problem - Telephone call from patient who reported she could not afford the generic Geodon as is still $65 a month.  Pt reported she returned to taking Risperdal and questions if this is what Dr. Lolly Mustache wants her to do until returns 07/11/15.  Patient questions if there is anything else cheaper Dr. Lolly Mustache may want her to try and denies any suicidal or homicidal ideations at this time.  If Dr. Lolly Mustache would prefer her to just continue with Risperdal, she will need a refill prior to next appointment.

## 2015-06-18 NOTE — Telephone Encounter (Signed)
Telephone call with patient to discuss her status with never starting Geodon due to cost.  Patient reports she continued with Risperdal 2mg  one at bedtime but was not sure how much she still has as her daughter does a weekly pill box for her. Informed per Dr. we could do a coupon for one time of Geodon but patient reported she would not want to start it and then still have to pay $65 a month for the medication.  Patient denied any auditory or visual hallucinations and no suicidal or homicidal ideations at this time.  States she is doing okay with Risperdal and will call back if needs to be seen prior to 07/11/15 evaluation or if she will need a refill of Risperdal after she questions her daughter on this need.  Patient agreed to call back if any increase in symptoms or other concerns.

## 2015-06-23 ENCOUNTER — Other Ambulatory Visit (HOSPITAL_COMMUNITY): Payer: Self-pay | Admitting: Psychiatry

## 2015-06-25 ENCOUNTER — Other Ambulatory Visit (HOSPITAL_COMMUNITY): Payer: Self-pay | Admitting: Psychiatry

## 2015-06-25 NOTE — Progress Notes (Signed)
Resume Risperdal 2 mg as patient cannot afford Geodon.

## 2015-07-11 ENCOUNTER — Encounter (HOSPITAL_COMMUNITY): Payer: Self-pay | Admitting: Psychiatry

## 2015-07-11 ENCOUNTER — Ambulatory Visit (INDEPENDENT_AMBULATORY_CARE_PROVIDER_SITE_OTHER): Payer: Medicare HMO | Admitting: Psychiatry

## 2015-07-11 DIAGNOSIS — F3162 Bipolar disorder, current episode mixed, moderate: Secondary | ICD-10-CM

## 2015-07-11 NOTE — Progress Notes (Signed)
[facility] [address] Progress Note  [name] [id number] 54 y.o.  Chief Complaint:  I cannot afford Geodon.  I'm taking Risperdal.  I start taking insulin I am hoping my blood sugar get better.  I'm taking my sugar every day.  History of Present Illness:  [name] came for her followup appointment.  On her last visit we recommended to try Geodon which helped her in the past but patient could not afford .  She called back and requested to restart Risperdal.  Now she is taking insulin and seen her endocrinologist .  Recently her insulin dose was increased.  She is also taking her insulin every day.  She still have crying spells, fatigue and feeling tired but symptoms are not as bad.  She is sleeping better with the Risperdal.  She denies any suicidal thoughts or homicidal thought but admitted continues to have chronic depression and feeling of hopelessness.  She denies any agitation or any anger.  Her biggest stressor is her husband who continues to drink.  Patient denies drinking or using any illegal substances.  She denies any paranoia or any hallucination.  She is compliant with her Risperdal, lithium and Wellbutrin.  She has no shakes or tremors.  Her appetite is okay.  She admitted that she need to see a counselor but she liked to see someone closer to her home.  She was seeing [name] but stopped due to financial reasons and travel distance.  Patient lives in [place].  Her appetite is okay.  Her vitals are stable.  Patient scheduled to go back to see endocrinologist on October 30 and she will do blood work at bedtime.  Suicidal Ideation: No Plan Formed: No Patient has means to carry out plan: No  Homicidal Ideation: No Plan Formed: No Patient has means to carry out plan: No  Review of Systems  Respiratory: Negative.   Cardiovascular: Negative for chest pain and palpitations.  Musculoskeletal: Negative.   Skin: Negative for itching and rash.  Neurological: Negative for  dizziness, tremors and headaches.  Psychiatric/Behavioral: Positive for depression. Negative for suicidal ideas. The patient is nervous/anxious and has insomnia.     Psychiatric: Agitation: No Hallucination: No Depressed Mood: Yes Insomnia: Yes Hypersomnia: No Altered Concentration: No Feels Worthless: No Grandiose Ideas: No Belief In Special Powers: No New/Increased Substance Abuse: No Compulsions: No  Neurologic: Headache: Yes Seizure: No Paresthesias: No  Past Medical History:  Diabetes, thyroid disease, hypertension, asthma, hypercholesterolemia, obesity. She see Thurman Coyer PA at [facility].  Her endocrinologist is Dr. Tinnie Gens.  Outpatient Encounter Prescriptions as of 07/11/2015  Medication Sig  . albuterol (PROVENTIL HFA;VENTOLIN HFA) 108 (90 BASE) MCG/ACT inhaler Inhale 2 puffs into the lungs as needed.    Marland Kitchen amLODipine (NORVASC) 10 MG tablet   . atorvastatin (LIPITOR) 80 MG tablet   . buPROPion (WELLBUTRIN XL) 150 MG 24 hr tablet Take 1 tablet (150 mg total) by mouth every morning.  . diclofenac (VOLTAREN) 75 MG EC tablet   . fluticasone (FLONASE) 50 MCG/ACT nasal spray   . HUMULIN 70/30 (70-30) 100 UNIT/ML injection   . insulin glargine (LANTUS) 100 UNIT/ML injection Inject 35 Units into the skin at bedtime.    Marland Kitchen levothyroxine (SYNTHROID, LEVOTHROID) 50 MCG tablet   . lithium carbonate (LITHOBID) 300 MG CR tablet Take 1 in AM and 2 at bed time  . metFORMIN (GLUCOPHAGE) 1000 MG tablet   . methocarbamol (ROBAXIN) 500 MG tablet   . metoprolol succinate (TOPROL-XL) 50 MG  24 hr tablet   . risperiDONE (RISPERDAL) 2 MG tablet TAKE 1 TABLET AT BEDTIME  . topiramate (TOPAMAX) 50 MG tablet Take 200 mg by mouth.  . traZODone (DESYREL) 150 MG tablet TAKE 1 TABLET BY MOUTH EVERY NIGHT AT BEDTIME  . [DISCONTINUED] topiramate (TOPAMAX) 100 MG tablet    No facility-administered encounter medications on file as of 07/11/2015.    Past Psychiatric  History/Hospitalization(s): Patient has at least 4-5 psychiatric hospitalization.  In the past she has tried Cymbalta , Geodon and Navane.  She endorses multiple suicidal attempts and overdose.  She's been seeing psychiatrist at early age.  She saw psychiatrist at [facility] and then [facility].  Her last psychiatric admission was in 2011.  Patient has history of paranoia, hallucination, manic-like symptoms, depression and hallucination.  She used to have a heavy drinking in her teenage years and she has DWI in the past. Anxiety: Yes Bipolar Disorder: Yes Depression: Yes Mania: Yes Psychosis: Yes Schizophrenia: No Personality Disorder: No Hospitalization for psychiatric illness: Yes History of Electroconvulsive Shock Therapy: No Prior Suicide Attempts: Yes  No results found for this or any previous visit (from the past 2160 hour(s)).  Physical Exam: Constitutional:  There were no vitals taken for this visit.  General Appearance: well nourished and obese  Musculoskeletal: Strength & Muscle Tone: tremors Gait & Station: normal Patient leans: N/A  Mental status examination Patient is obese female who is casually dressed and fairly groomed.  She described her mood sad and depressed.  Her affect is constricted.  Her speech is slow with decreased volume and tone.  Her attention and concentration is fair.  Her thought process logical and goal-directed.  She denies any auditory or visual hallucination.  She denies any active or passive suicidal thoughts or homicidal thought.  There were no delusions, paranoia or any obsessive thoughts.  Her fund of knowledge is average.  Her psychomotor activity is slow.  She is alert and oriented 3 however she has difficulty remembering things.  She has mild tremors in her hand however there were no EPS.  Her insight judgment and impulse control is okay.  Established Problem, Stable/Improving (1), Review of Psycho-Social Stressors (1), Review  or order clinical lab tests (1), Review and summation of old records (2), Established Problem, Worsening (2), Review of Last Therapy Session (1), Review of Medication Regimen & Side Effects (2) and Review of New Medication or Change in Dosage (2)  Assessment: Axis I: Bipolar disorder, mixed  Axis II: Deferred  Axis III: Diabetes, thyroid disease, hypertension, asthma, hypercholesterolemia, obesity  Plan:  Patient restarted taking Risperdal as she cannot afford Geodon.  We talked at length about seeing a therapist since patient continues to have a lot of psychosocial issues.  I recommended to see Florencia Reasons in [place] as patient lives there.  I encouraged to keep appointment with endocrinologist and we will also get records from her endocrinologist.  Her last hemoglobin A1c was 11 and not patient is taking higher dose of insulin and checking her blood sugar.  I had a long discussion with the patient about Risperdal causing increase metabolic syndrome but patient cannot afford Geodon.  Her last lithium level was 0.60.  We will continue lithium 300 mg in the morning and 600 mg at bedtime, trazodone 150 mg at bedtime and Wellbutrin XL 150 mg daily.  Discussed medication side effects and benefits.  Encouraged to keep appointment with Florencia Reasons.  Recommended to call us back if she has  any question or any concern.  Follow-up in 2 months.  Time spent 25 minutes.  More than 50% of the time is spent in psychoeducation, counseling and correlation of care.  Amal Saiki T., MD 07/11/2015

## 2015-07-23 ENCOUNTER — Other Ambulatory Visit (HOSPITAL_COMMUNITY): Payer: Self-pay | Admitting: Psychiatry

## 2015-07-24 ENCOUNTER — Other Ambulatory Visit (HOSPITAL_COMMUNITY): Payer: Self-pay | Admitting: Psychiatry

## 2015-07-29 ENCOUNTER — Ambulatory Visit (INDEPENDENT_AMBULATORY_CARE_PROVIDER_SITE_OTHER): Payer: Medicare HMO | Admitting: Psychiatry

## 2015-07-29 ENCOUNTER — Encounter (HOSPITAL_COMMUNITY): Payer: Self-pay | Admitting: Psychiatry

## 2015-07-29 DIAGNOSIS — F3162 Bipolar disorder, current episode mixed, moderate: Secondary | ICD-10-CM | POA: Diagnosis not present

## 2015-07-29 NOTE — Patient Instructions (Signed)
Discussed orally 
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
## 2015-08-12 ENCOUNTER — Other Ambulatory Visit (HOSPITAL_COMMUNITY): Payer: Self-pay | Admitting: Psychiatry

## 2015-08-22 ENCOUNTER — Other Ambulatory Visit (HOSPITAL_COMMUNITY): Payer: Self-pay | Admitting: Psychiatry

## 2015-08-25 ENCOUNTER — Telehealth (HOSPITAL_COMMUNITY): Payer: Self-pay | Admitting: *Deleted

## 2015-08-25 NOTE — Telephone Encounter (Signed)
Pt called for a refill for traZODone (DESYREL) 150 MG tablet.  Pt is schedule for a f/u appt on 12/14.

## 2015-08-26 ENCOUNTER — Ambulatory Visit (INDEPENDENT_AMBULATORY_CARE_PROVIDER_SITE_OTHER): Payer: Medicare HMO | Admitting: Psychiatry

## 2015-08-26 ENCOUNTER — Encounter (HOSPITAL_COMMUNITY): Payer: Self-pay | Admitting: Psychiatry

## 2015-08-26 DIAGNOSIS — F3162 Bipolar disorder, current episode mixed, moderate: Secondary | ICD-10-CM

## 2015-08-26 NOTE — Telephone Encounter (Signed)
Telephone call with patient reporting she needed a refill of Trazodone and questioned if she received 90 day order sent from [facility] that was ordered 06/13/15.  Patient stated she got the other medication sent to her then but not her Trazodone and is almost out.  Called [employer] who reported the medication was mailed out on 06/17/15 with patient's Lithium and Bupropion and confirmed received on 06/19/15.  Called patient back and discussed with her daughter who states she fills patient medication box weekly and is not sure why it ran out early.  States patient is out for the past 2 days and denies she has been overtaking it.  Requests a new 90 day order be sent to [facility] or a 30 day order to [facility] in [place] if Dr. Lolly Mustache will not approve a new 90 day order.  Patient returns on 09/10/15 for next evaluation.

## 2015-08-26 NOTE — Patient Instructions (Signed)
Discussed orally 

## 2015-08-26 NOTE — Progress Notes (Signed)
   THERAPIST PROGRESS NOTE  Session Time:  Tuesday 08/26/2015 10:55 AM  - 11:54 AM  Participation Level: Active  Behavioral Response: Fairly GroomedAlertEuthymic  Type of Therapy: Individual Therapy  Treatment Goals:   1. Develop and maintain a pattern of regular rhythm to daily activities.     2. Identify and replace thoughts and behaviors that trigger manic or depressive symptoms.  Treatment Goals addressed: 1,2  Interventions: Supportive  Summary: [name] is a 54 y.o. female who presents is referred for services by psychiatrist Dr. Lolly Mustache to improve coping skills. Patient reports she had been receiving psychotherapy services in [place] but stopped attending regularly due to difficulty driving in traffic. She is looking for services closer to home. Patient continues to see Dr. Lolly Mustache for medication management as she has a diagnosis of Bipolar Disorder. She reports stress related to husband who is an alcoholic. He is disabled and walks with a cane. He is demanding and controlling wanting patient to always be near him and do what he wants to do.  Her son-in-law also is an alcoholic and she worries about the way he is raising her 17 year old grandson.  She reports staying depressed. She says her sleep is terrible and memory is awful.   Patient reports feeling better since last session as husband has reduced alcohol use and hasn't drank since Thanksgiving. However, she anticipates he may resume use any moment as he has a pattern of using every 3-4 weeks. She reports he is verbally aggressive when drinking but states they get along well when he isn't drinking. She says she has tried to talk to him but says he just doesn't want to talk about it. She also has attended Al-Anon groups in the past but says meetings weren't helpful. She admits her expectation was for the groups to fix her husband. She continues to experience sleep difficulty and states sleeping only about 3 hours per night.  She maintains regular attendance at church and social contact with daughter and grandson. She walks her dog daily but reports little involvement in activity during the day. Patient reports negative thoughts about self.   Suicidal/Homicidal: No  Therapist Response: Therapist works with patient to establish rapport, review symptoms, identify strengths, identify support system, and develop treatment plan.   Plan: Return again in 2 weeks.  Patient agrees to chart moods daily and bring to next session.   Diagnosis: Axis I: Bipolar, mixed    Axis II: Deferred    Shateka Petrea, LCSW 08/26/2015

## 2015-08-26 NOTE — Telephone Encounter (Signed)
Called and spoke with patient's Daughter, who reported patient had already left to go home and informed Dr. Lolly Mustache would not be refilling patient's Trazodone early as [facility] report it was sent out with other medications that were received and they are reporting a 90 day order was shipped.  Ms. Seelye agreed with plan and will call if her Mother begins to have any problems prior to appointment set for 09/10/15.

## 2015-09-09 ENCOUNTER — Ambulatory Visit (HOSPITAL_COMMUNITY): Payer: Self-pay | Admitting: Psychiatry

## 2015-09-10 ENCOUNTER — Ambulatory Visit (HOSPITAL_COMMUNITY): Payer: Self-pay | Admitting: Psychiatry

## 2015-09-23 ENCOUNTER — Telehealth (HOSPITAL_COMMUNITY): Payer: Self-pay

## 2015-09-23 NOTE — Telephone Encounter (Signed)
Telephone call with patient after she left a message requesting a refill of her prescribed Trazodone, last ordered 06/13/15 for 90 days.  Patient reported having to cancel and reschedule from 09/10/15 to 10/15/15 but now needs a new 90 day order of Trazodone e-scribed to [facility].  Patient stated she did not think she was in need of other orders at this time but would call back if those will be running out before 10/15/15.  Informed patient Dr. Lolly Mustache is out this week but will request refill from covering provider and contact patient back once request addressed and patient agreed with plan.

## 2015-10-01 ENCOUNTER — Other Ambulatory Visit (HOSPITAL_COMMUNITY): Payer: Self-pay | Admitting: Psychiatry

## 2015-10-01 DIAGNOSIS — F3162 Bipolar disorder, current episode mixed, moderate: Secondary | ICD-10-CM

## 2015-10-01 NOTE — Telephone Encounter (Signed)
Met with Dr. Adele Schilder who approved a new 90 day order for patient's Risperdal 2 mg tablet, one at bedtime, #90 with no refills be e-scribed to patient's [place].   New order e-scribed as approved and patient is set to return for next evaluation on 10/15/15.

## 2015-10-15 ENCOUNTER — Ambulatory Visit (INDEPENDENT_AMBULATORY_CARE_PROVIDER_SITE_OTHER): Payer: Medicare HMO | Admitting: Psychiatry

## 2015-10-15 ENCOUNTER — Encounter (HOSPITAL_COMMUNITY): Payer: Self-pay | Admitting: Psychiatry

## 2015-10-15 VITALS — BP 130/80 | HR 89 | Ht 65.0 in | Wt 324.6 lb

## 2015-10-15 DIAGNOSIS — F3130 Bipolar disorder, current episode depressed, mild or moderate severity, unspecified: Secondary | ICD-10-CM

## 2015-10-15 DIAGNOSIS — F3162 Bipolar disorder, current episode mixed, moderate: Secondary | ICD-10-CM | POA: Diagnosis not present

## 2015-10-15 MED ORDER — RISPERIDONE 2 MG PO TABS: 2.0000 mg | ORAL_TABLET | Freq: Every day | ORAL | Status: DC

## 2015-10-15 MED ORDER — LITHIUM CARBONATE ER 300 MG PO TBCR: EXTENDED_RELEASE_TABLET | ORAL | Status: DC

## 2015-10-15 MED ORDER — TRAZODONE HCL 150 MG PO TABS: ORAL_TABLET | ORAL | Status: DC

## 2015-10-15 MED ORDER — BUPROPION HCL ER (XL) 150 MG PO TB24: 150.0000 mg | ORAL_TABLET | Freq: Every morning | ORAL | Status: DC

## 2015-10-15 NOTE — Progress Notes (Signed)
[facility] [address] Progress Note  [name] [id number] 55 y.o.  Chief Complaint:  I misplaced my trazodone.  Have not taken for past few weeks and I cannot sleep well.   History of Present Illness:  [name] came for her followup appointment.  She had missed last appointment and also she misplaced her trazodone and has been noncompliant for past 10 days.  She admitted poor sleep but overall she described her mood is stable.  She is taking her medication otherwise as prescribed.  She is taking care of for diabetes and happy that her blood sugar is getting better.  She started seeing Peggy for counseling and she is happy about it.  She also wants to see a psychiatrist in [place] as it helps transportation time and gas money.  She is pleased that her husband cut down drinking from the past.  She had a good Christmas.  She denies any paranoia, hallucination, irritability or any anger.  She denies any side effects.  She wants to continue her current psychiatric medication.  She denies any feeling of hopelessness or worthlessness.  Her energy level is okay.  Her appetite is okay.  Her vitals are stable.    Suicidal Ideation: No Plan Formed: No Patient has means to carry out plan: No  Homicidal Ideation: No Plan Formed: No Patient has means to carry out plan: No  Review of Systems  Respiratory: Negative.   Cardiovascular: Negative for chest pain and palpitations.  Musculoskeletal: Negative.   Skin: Negative for itching and rash.  Neurological: Negative for dizziness, tremors and headaches.  Psychiatric/Behavioral: Negative for suicidal ideas. The patient has insomnia.     Psychiatric: Agitation: No Hallucination: No Depressed Mood: No Insomnia: Yes Hypersomnia: No Altered Concentration: No Feels Worthless: No Grandiose Ideas: No Belief In Special Powers: No New/Increased Substance Abuse: No Compulsions: No  Neurologic: Headache: No Seizure: No Paresthesias:  No  Past Medical History:  Diabetes, thyroid disease, hypertension, asthma, hypercholesterolemia, obesity. She see Thurman Coyer PA at [facility].  Her endocrinologist is Dr. Tinnie Gens.  Outpatient Encounter Prescriptions as of 10/15/2015  Medication Sig  . albuterol (PROVENTIL HFA;VENTOLIN HFA) 108 (90 BASE) MCG/ACT inhaler Inhale 2 puffs into the lungs as needed.    Marland Kitchen amLODipine (NORVASC) 10 MG tablet   . atorvastatin (LIPITOR) 80 MG tablet   . buPROPion (WELLBUTRIN XL) 150 MG 24 hr tablet Take 1 tablet (150 mg total) by mouth every morning.  . diclofenac (VOLTAREN) 75 MG EC tablet   . fluticasone (FLONASE) 50 MCG/ACT nasal spray   . HUMULIN 70/30 (70-30) 100 UNIT/ML injection   . insulin glargine (LANTUS) 100 UNIT/ML injection Inject 35 Units into the skin at bedtime.    Marland Kitchen levothyroxine (SYNTHROID, LEVOTHROID) 50 MCG tablet   . lithium carbonate (LITHOBID) 300 MG CR tablet Take 1 in AM and 2 at bed time  . metFORMIN (GLUCOPHAGE) 1000 MG tablet   . methocarbamol (ROBAXIN) 500 MG tablet   . metoprolol succinate (TOPROL-XL) 50 MG 24 hr tablet   . risperiDONE (RISPERDAL) 2 MG tablet Take 1 tablet (2 mg total) by mouth at bedtime.  . topiramate (TOPAMAX) 50 MG tablet Take 200 mg by mouth.  . traZODone (DESYREL) 150 MG tablet TAKE 1 TABLET BY MOUTH EVERY NIGHT AT BEDTIME  . [DISCONTINUED] buPROPion (WELLBUTRIN XL) 150 MG 24 hr tablet Take 1 tablet (150 mg total) by mouth every morning.  . [DISCONTINUED] lithium carbonate (LITHOBID) 300 MG CR tablet Take 1 in AM and  2 at bed time  . [DISCONTINUED] risperiDONE (RISPERDAL) 2 MG tablet TAKE 1 TABLET AT BEDTIME  . [DISCONTINUED] traZODone (DESYREL) 150 MG tablet TAKE 1 TABLET BY MOUTH EVERY NIGHT AT BEDTIME   No facility-administered encounter medications on file as of 10/15/2015.    Past Psychiatric History/Hospitalization(s): Patient has at least 4-5 psychiatric hospitalization.  In the past she has tried Cymbalta , Geodon and Navane.  She  endorses multiple suicidal attempts and overdose.  She's been seeing psychiatrist at early age.  She saw psychiatrist at [facility] and then [facility].  Her last psychiatric admission was in 2011.  Patient has history of paranoia, hallucination, manic-like symptoms, depression and hallucination.  She used to have a heavy drinking in her teenage years and she has DWI in the past. Anxiety: Yes Bipolar Disorder: Yes Depression: Yes Mania: Yes Psychosis: Yes Schizophrenia: No Personality Disorder: No Hospitalization for psychiatric illness: Yes History of Electroconvulsive Shock Therapy: No Prior Suicide Attempts: Yes  No results found for this or any previous visit (from the past 2160 hour(s)).  Physical Exam: Constitutional:  BP 130/80 mmHg  Pulse 89  Ht 5\' 5"  (1.651 m)  Wt 324 lb 9.6 oz (147.238 kg)  BMI 54.02 kg/m2  General Appearance: well nourished and obese  Musculoskeletal: Strength & Muscle Tone: tremors Gait & Station: normal Patient leans: N/A  Mental status examination Patient is obese female who is casually dressed and fairly groomed.  She described her mood euthymic and her affect is appropriate.  Her speech is slow with decreased volume and tone.  Her attention and concentration is fair.  Her thought process logical and goal-directed.  She denies any auditory or visual hallucination.  She denies any active or passive suicidal thoughts or homicidal thought.  There were no delusions, paranoia or any obsessive thoughts.  Her fund of knowledge is average.  Her psychomotor activity is slow.  She is alert and oriented 3 however she has difficulty remembering things.  She has mild tremors in her hand however there were no EPS.  Her insight judgment and impulse control is okay.  Established Problem, Stable/Improving (1), Review of Last Therapy Session (1) and Review of Medication Regimen & Side Effects (2)  Assessment: Axis I: Bipolar disorder, mixed  Axis  II: Deferred  Axis III: Diabetes, thyroid disease, hypertension, asthma, hypercholesterolemia, obesity  Plan:  patient doing better since she is taking her medication as prescribed and also seeing for therapy.  Discussed noncompliance with trazodone and encouraged to keep her medication box locked .  Discuss safety concerns.  Patient like to continue her current psychiatric medication and therapy with Florencia Reasons.  She also requesting to see psychiatrist in [place] as patient lived in that area.  I will continue  lithium 300 mg in the morning and 600 mg at bedtime, trazodone 150 mg at bedtime, Risperdal 2 mg at bedtime and Wellbutrin XL 150 mg daily.  Discussed medication side effects and benefits especially metabolic syndrome with Risperdal .  Encouraged to keep appointment with [place].  Recommended to call Florencia Reasons back if she has any question or any concern.  Patient will see Dr. [place] in [place] .  Recommended to have blood work on her next appointment including lithium level.     Iysha Mishkin T., MD 10/15/2015

## 2015-12-09 ENCOUNTER — Ambulatory Visit (INDEPENDENT_AMBULATORY_CARE_PROVIDER_SITE_OTHER): Payer: Medicare HMO | Admitting: Psychiatry

## 2015-12-09 ENCOUNTER — Encounter (HOSPITAL_COMMUNITY): Payer: Self-pay | Admitting: Psychiatry

## 2015-12-09 VITALS — BP 142/77 | HR 78 | Ht 65.0 in | Wt 326.2 lb

## 2015-12-09 DIAGNOSIS — F3130 Bipolar disorder, current episode depressed, mild or moderate severity, unspecified: Secondary | ICD-10-CM

## 2015-12-09 DIAGNOSIS — F3162 Bipolar disorder, current episode mixed, moderate: Secondary | ICD-10-CM

## 2015-12-09 MED ORDER — TRAZODONE HCL 300 MG PO TABS: 300.0000 mg | ORAL_TABLET | Freq: Every day | ORAL | Status: DC

## 2015-12-09 MED ORDER — LITHIUM CARBONATE ER 300 MG PO TBCR: EXTENDED_RELEASE_TABLET | ORAL | Status: DC

## 2015-12-09 MED ORDER — RISPERIDONE 2 MG PO TABS: 2.0000 mg | ORAL_TABLET | Freq: Every day | ORAL | Status: DC

## 2015-12-09 MED ORDER — BUPROPION HCL ER (XL) 300 MG PO TB24: 300.0000 mg | ORAL_TABLET | ORAL | Status: DC
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
## 2015-12-10 ENCOUNTER — Telehealth (HOSPITAL_COMMUNITY): Payer: Self-pay | Admitting: *Deleted

## 2015-12-10 NOTE — Telephone Encounter (Addendum)
Pt came into office with an updated copy of her medications with modifications. Per pt, she would like for office to make changes.

## 2015-12-17 ENCOUNTER — Other Ambulatory Visit (HOSPITAL_COMMUNITY): Payer: Self-pay | Admitting: Psychiatry

## 2016-01-06 ENCOUNTER — Telehealth (HOSPITAL_COMMUNITY): Payer: Self-pay | Admitting: *Deleted

## 2016-01-06 ENCOUNTER — Ambulatory Visit (INDEPENDENT_AMBULATORY_CARE_PROVIDER_SITE_OTHER): Payer: Medicare HMO | Admitting: Psychiatry

## 2016-01-06 ENCOUNTER — Encounter (HOSPITAL_COMMUNITY): Payer: Self-pay | Admitting: Psychiatry

## 2016-01-06 VITALS — BP 154/84 | HR 76 | Ht 65.0 in | Wt 329.0 lb

## 2016-01-06 DIAGNOSIS — F3130 Bipolar disorder, current episode depressed, mild or moderate severity, unspecified: Secondary | ICD-10-CM | POA: Diagnosis not present

## 2016-01-06 MED ORDER — CLONAZEPAM 1 MG PO TABS: 1.0000 mg | ORAL_TABLET | Freq: Two times a day (BID) | ORAL | Status: DC | PRN
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
## 2016-01-06 NOTE — Telephone Encounter (Signed)
Pt came into office for f/u and provider hand wrote her a script for Clonazepam 20 mg BID PRN or anxiety. Script number is [id number].

## 2016-02-03 ENCOUNTER — Ambulatory Visit (INDEPENDENT_AMBULATORY_CARE_PROVIDER_SITE_OTHER): Payer: Medicare HMO | Admitting: Psychiatry

## 2016-02-03 ENCOUNTER — Encounter (HOSPITAL_COMMUNITY): Payer: Self-pay | Admitting: Psychiatry

## 2016-02-03 ENCOUNTER — Telehealth (HOSPITAL_COMMUNITY): Payer: Self-pay | Admitting: *Deleted

## 2016-02-03 VITALS — BP 160/121 | HR 58 | Ht 65.0 in | Wt 317.0 lb

## 2016-02-03 DIAGNOSIS — F3130 Bipolar disorder, current episode depressed, mild or moderate severity, unspecified: Secondary | ICD-10-CM

## 2016-02-03 DIAGNOSIS — F3162 Bipolar disorder, current episode mixed, moderate: Secondary | ICD-10-CM

## 2016-02-03 MED ORDER — LITHIUM CARBONATE ER 300 MG PO TBCR: EXTENDED_RELEASE_TABLET | ORAL | Status: DC

## 2016-02-03 MED ORDER — TRAZODONE HCL 300 MG PO TABS: 300.0000 mg | ORAL_TABLET | Freq: Every day | ORAL | Status: DC

## 2016-02-03 MED ORDER — RISPERIDONE 2 MG PO TABS: 2.0000 mg | ORAL_TABLET | Freq: Every day | ORAL | Status: DC

## 2016-02-03 MED ORDER — CLONAZEPAM 1 MG PO TABS: 1.0000 mg | ORAL_TABLET | Freq: Two times a day (BID) | ORAL | Status: DC | PRN

## 2016-02-03 NOTE — Telephone Encounter (Signed)
ERROR
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
## 2016-02-04 ENCOUNTER — Telehealth (HOSPITAL_COMMUNITY): Payer: Self-pay | Admitting: *Deleted

## 2016-02-04 LAB — THYROID PANEL WITH TSH
FREE THYROXINE INDEX: 2.2 (ref 1.4–3.8)
T3 Uptake: 32 % (ref 22–35)
T4 TOTAL: 6.8 ug/dL (ref 4.5–12.0)
TSH: 4.88 m[IU]/L — AB

## 2016-02-04 NOTE — Telephone Encounter (Signed)
Opened in Error.

## 2016-02-11 ENCOUNTER — Ambulatory Visit (INDEPENDENT_AMBULATORY_CARE_PROVIDER_SITE_OTHER): Payer: Medicare HMO | Admitting: Psychiatry

## 2016-02-11 ENCOUNTER — Encounter (HOSPITAL_COMMUNITY): Payer: Self-pay | Admitting: Psychiatry

## 2016-02-11 VITALS — BP 142/101 | HR 69 | Ht 65.0 in | Wt 312.2 lb

## 2016-02-11 DIAGNOSIS — F3130 Bipolar disorder, current episode depressed, mild or moderate severity, unspecified: Secondary | ICD-10-CM

## 2016-02-11 MED ORDER — BUPROPION HCL ER (XL) 150 MG PO TB24: 150.0000 mg | ORAL_TABLET | ORAL | Status: DC
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
## 2016-02-19 ENCOUNTER — Ambulatory Visit (HOSPITAL_COMMUNITY): Payer: Self-pay | Admitting: Psychiatry

## 2016-03-10 ENCOUNTER — Ambulatory Visit (INDEPENDENT_AMBULATORY_CARE_PROVIDER_SITE_OTHER): Payer: Medicare HMO | Admitting: Psychiatry

## 2016-03-10 ENCOUNTER — Encounter (HOSPITAL_COMMUNITY): Payer: Self-pay | Admitting: Psychiatry

## 2016-03-10 VITALS — BP 137/122 | HR 101 | Ht 65.0 in | Wt 321.2 lb

## 2016-03-10 DIAGNOSIS — F3130 Bipolar disorder, current episode depressed, mild or moderate severity, unspecified: Secondary | ICD-10-CM | POA: Diagnosis not present

## 2016-03-10 MED ORDER — BUPROPION HCL ER (XL) 150 MG PO TB24: 150.0000 mg | ORAL_TABLET | ORAL | Status: DC
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
## 2016-03-12 LAB — LITHIUM LEVEL: LITHIUM LVL: 0.6 mmol/L — AB (ref 0.80–1.40)

## 2016-03-16 ENCOUNTER — Ambulatory Visit (HOSPITAL_COMMUNITY): Payer: Self-pay | Admitting: Psychiatry

## 2016-04-13 ENCOUNTER — Ambulatory Visit (INDEPENDENT_AMBULATORY_CARE_PROVIDER_SITE_OTHER): Payer: Medicare HMO | Admitting: Psychiatry

## 2016-04-13 ENCOUNTER — Encounter (HOSPITAL_COMMUNITY): Payer: Self-pay | Admitting: Psychiatry

## 2016-04-13 VITALS — BP 156/86 | HR 58 | Ht 65.0 in | Wt 304.0 lb

## 2016-04-13 DIAGNOSIS — F3162 Bipolar disorder, current episode mixed, moderate: Secondary | ICD-10-CM

## 2016-04-13 DIAGNOSIS — F3130 Bipolar disorder, current episode depressed, mild or moderate severity, unspecified: Secondary | ICD-10-CM | POA: Diagnosis not present

## 2016-04-13 MED ORDER — LITHIUM CARBONATE ER 300 MG PO TBCR: EXTENDED_RELEASE_TABLET | ORAL | Status: DC

## 2016-04-13 MED ORDER — RISPERIDONE 2 MG PO TABS: 2.0000 mg | ORAL_TABLET | Freq: Every day | ORAL | Status: DC

## 2016-04-13 MED ORDER — BUPROPION HCL ER (XL) 150 MG PO TB24: 150.0000 mg | ORAL_TABLET | ORAL | Status: DC

## 2016-04-13 MED ORDER — TRAZODONE HCL 300 MG PO TABS: 300.0000 mg | ORAL_TABLET | Freq: Every day | ORAL | Status: DC

## 2016-04-13 MED ORDER — CLONAZEPAM 1 MG PO TABS: 1.0000 mg | ORAL_TABLET | Freq: Two times a day (BID) | ORAL | Status: DC | PRN
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
## 2016-05-06 ENCOUNTER — Encounter (HOSPITAL_COMMUNITY): Payer: Self-pay | Admitting: Psychiatry

## 2016-05-06 ENCOUNTER — Telehealth (HOSPITAL_COMMUNITY): Payer: Self-pay | Admitting: *Deleted

## 2016-05-06 ENCOUNTER — Ambulatory Visit (INDEPENDENT_AMBULATORY_CARE_PROVIDER_SITE_OTHER): Payer: Medicare HMO | Admitting: Psychiatry

## 2016-05-06 VITALS — BP 166/100 | HR 92 | Ht 65.0 in | Wt 297.0 lb

## 2016-05-06 DIAGNOSIS — F3130 Bipolar disorder, current episode depressed, mild or moderate severity, unspecified: Secondary | ICD-10-CM | POA: Diagnosis not present

## 2016-05-06 NOTE — Progress Notes (Signed)
Patient ID: [name], female   DOB: 04/22/1961, 55 y.o.   MRN: [id number]

## 2016-05-06 NOTE — Telephone Encounter (Signed)
voice message from patient's daughter.  She said she spoke with [facility] and the patient's [facility] provider, and both said no authorization is required, that both Dr. Tenny Craw and Gigi Gin is in network.

## 2016-05-06 NOTE — Telephone Encounter (Signed)
lmtcb and office number provided 
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
## 2016-05-06 NOTE — Patient Instructions (Signed)
Take latuda with food

## 2016-05-10 NOTE — Telephone Encounter (Signed)
noted 

## 2016-05-13 ENCOUNTER — Ambulatory Visit (INDEPENDENT_AMBULATORY_CARE_PROVIDER_SITE_OTHER): Payer: Medicare HMO | Admitting: Psychiatry

## 2016-05-13 ENCOUNTER — Encounter (HOSPITAL_COMMUNITY): Payer: Self-pay | Admitting: Psychiatry

## 2016-05-13 DIAGNOSIS — F3130 Bipolar disorder, current episode depressed, mild or moderate severity, unspecified: Secondary | ICD-10-CM
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
## 2016-05-24 ENCOUNTER — Encounter (HOSPITAL_COMMUNITY): Payer: Self-pay | Admitting: Psychiatry

## 2016-05-24 ENCOUNTER — Ambulatory Visit (INDEPENDENT_AMBULATORY_CARE_PROVIDER_SITE_OTHER): Payer: Medicare HMO | Admitting: Psychiatry

## 2016-05-24 VITALS — BP 128/75 | HR 66 | Ht 65.0 in | Wt 304.0 lb

## 2016-05-24 DIAGNOSIS — F3130 Bipolar disorder, current episode depressed, mild or moderate severity, unspecified: Secondary | ICD-10-CM

## 2016-05-24 MED ORDER — LURASIDONE HCL 60 MG PO TABS: 60.0000 mg | ORAL_TABLET | Freq: Every day | ORAL | 2 refills | Status: DC

## 2016-05-24 MED ORDER — ZOLPIDEM TARTRATE 10 MG PO TABS: 10.0000 mg | ORAL_TABLET | Freq: Every evening | ORAL | 0 refills | Status: DC | PRN
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
## 2016-05-25 ENCOUNTER — Ambulatory Visit (INDEPENDENT_AMBULATORY_CARE_PROVIDER_SITE_OTHER): Payer: Medicare HMO | Admitting: Psychiatry

## 2016-05-25 ENCOUNTER — Encounter (HOSPITAL_COMMUNITY): Payer: Self-pay | Admitting: Psychiatry

## 2016-05-25 DIAGNOSIS — F3130 Bipolar disorder, current episode depressed, mild or moderate severity, unspecified: Secondary | ICD-10-CM

## 2016-05-25 NOTE — Progress Notes (Signed)
   THERAPIST PROGRESS NOTE  Session Time:  02/08/2023 05/25/2016 10:07 AM -  11:05 AM  Participation Level: Active  Behavioral Response: fairly groomed, depressed, tearful,   Type of Therapy: Individual Therapy  Treatment Goals:   1. Develop and maintain a pattern of regular rhythm to daily activities.     2. Identify and replace thoughts and behaviors that trigger manic or depressive symptoms.  Treatment Goals addressed: 1,2  Interventions: Supportive  Summary: EMALEA MIX is a 55 y.o. female who presents is referred for services by psychiatrist Dr. Lolly Mustache to improve coping skills. Patient reports she had been receiving psychotherapy services in [place] but stopped attending regularly due to difficulty driving in traffic. She is looking for services closer to home. Patient continues to see Dr. Lolly Mustache for medication management as she has a diagnosis of Bipolar Disorder. She reports stress related to husband who is an alcoholic. He is disabled and walks with a cane. He is demanding and controlling wanting patient to always be near him and do what he wants to do.  Her son-in-law also is an alcoholic and she worries about the way he is raising her 3 year old grandson.  She reports staying depressed. She says her sleep is terrible and memory is awful.    Patient last was seen in November 2016. She reports she stopped attending therapy due to insurance not covering treatment, Those issues have been resolved, She reports experiencing increased stress and depressed mood since husband died in 2016-02-08. She says husband had a massive stroke while he was visiting family in [place]. She reports having no closure as he was buried in [place] according to his wishes. However, patient could not afford to go to the funeral. She had a memorial service for him here at her church but says it wasn't the same. She reports strong support from her daughters,pastor, church family, and friends. She reports  additional grief and loss issues related to her dog dying last week. Patient reports little involvement in activity besides watching TV during the day. She also reports difficulty falling and staying asleep although taking trazadone. She will discuss with Dr. Tenny Craw.  Patient reports continued anxiety, depressed mood, tearfulness, loss of appetite, sleep difficulty, and excessive worry since last session. She reports increasing efforts to become involved in activity since last session including walking and assisting with [employer]. She reports reading a magazine. She has made efforts to improve self care regarding daily hygiene and eating patterns. She continues to experience grief and loss issues.    Suicidal/Homicidal: No  Therapist Response: Reviewed symptoms, facilitated expression of feelings, praise and reinforced patient's increased self-care efforts and involvement in activity, assisted patient identify strengths and supports, reviewed and revised treatment plan, normalized feelings related to grief, provided patient with handout on the mourning the lossof a spouse   Plan: Return again in 2 weeks.  Patient agrees to utilize strategies discussed in session, continued daily planning, and review handout.  Diagnosis: Axis I: Bipolar, mixed    Axis II: Deferred    Dafne Nield, LCSW 05/25/2016

## 2016-05-28 ENCOUNTER — Telehealth (HOSPITAL_COMMUNITY): Payer: Self-pay | Admitting: *Deleted

## 2016-05-28 NOTE — Telephone Encounter (Signed)
Prior authorization for Latuda received. Submitted online with Cover my meds. Awaiting decision.

## 2016-06-01 NOTE — Telephone Encounter (Signed)
noted 

## 2016-06-02 ENCOUNTER — Telehealth (HOSPITAL_COMMUNITY): Payer: Self-pay | Admitting: *Deleted

## 2016-06-02 ENCOUNTER — Other Ambulatory Visit (HOSPITAL_COMMUNITY): Payer: Self-pay | Admitting: Psychiatry

## 2016-06-02 MED ORDER — LURASIDONE HCL 60 MG PO TABS: 60.0000 mg | ORAL_TABLET | Freq: Every day | ORAL | 2 refills | Status: DC

## 2016-06-02 NOTE — Telephone Encounter (Signed)
30 day supply sent to pharmacy, please tell pt. She can still use the samples if needed

## 2016-06-02 NOTE — Telephone Encounter (Signed)
Received denail for 90 day supply of Latuda due to being a specialty drug. Specialty drugs are limited to a 30 day supply. The 30 day supply has been approved for 2 years.

## 2016-06-02 NOTE — Telephone Encounter (Signed)
Yes

## 2016-06-02 NOTE — Telephone Encounter (Signed)
Called pt and informed her provider have samples for her. Called pt and informed her that her 30 days supply was approved and she do not have to come for the samples if she do not need it. Pt verbalized understanding.

## 2016-06-02 NOTE — Telephone Encounter (Signed)
Pt called stating her insurance is not wanting to pay for the first $500 of her Latuda 60 mg. Informed pt that office did receive Prior Auth from pharmacy and it was submitted to her insurance and office is waiting for decision. Pt verbalized understanding. Asked pt if she is out of medication and she stated she have a couple left and will be out this week. Pt would like to know if Dr. Tenny Craw could provide her with samples while we wait for decision from insurance? Pt number is [phone].

## 2016-06-03 ENCOUNTER — Encounter (HOSPITAL_COMMUNITY): Payer: Self-pay | Admitting: *Deleted

## 2016-06-03 ENCOUNTER — Telehealth (HOSPITAL_COMMUNITY): Payer: Self-pay | Admitting: *Deleted

## 2016-06-03 NOTE — Telephone Encounter (Signed)
patient is here to pick up samples.

## 2016-06-03 NOTE — Telephone Encounter (Signed)
Pt picked up samples

## 2016-06-03 NOTE — Progress Notes (Signed)
Pt came into office to pick up samples per yesterday conversation. Pt takes Latuda 60 mg daily. Due to office not having 60 mg in stock, provider gived pt a 20 mg and a 40 mg. Provider gived pt 1 box of each and within each box is 4 cards with 7 tabs. Latuda 20 mg LOT number is [id number] P expiration date of 10-21 and Latuda 40 mg LOT number is [id number] P expiration date 7-21. Asked pt for her D/L and she stated she did not have it and do not have it in her car. Informed pt she will need to have D/L for next sample pick up and pt verbalized understanding.

## 2016-06-03 NOTE — Telephone Encounter (Signed)
Pt came into office today and picked up 20 and 40 mg Samples of Latuda. Pt stated she did not have her D?L or picture ID for documentation. Informed pt to please bring next time and pt verbalized agreement.

## 2016-06-10 ENCOUNTER — Encounter (HOSPITAL_COMMUNITY): Payer: Self-pay | Admitting: Psychiatry

## 2016-06-10 ENCOUNTER — Ambulatory Visit (INDEPENDENT_AMBULATORY_CARE_PROVIDER_SITE_OTHER): Payer: Medicare HMO | Admitting: Psychiatry

## 2016-06-10 DIAGNOSIS — F3130 Bipolar disorder, current episode depressed, mild or moderate severity, unspecified: Secondary | ICD-10-CM
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
## 2016-06-13 ENCOUNTER — Encounter (HOSPITAL_COMMUNITY): Payer: Self-pay | Admitting: Emergency Medicine

## 2016-06-13 ENCOUNTER — Emergency Department (HOSPITAL_COMMUNITY)
Admission: EM | Admit: 2016-06-13 | Discharge: 2016-06-13 | Disposition: A | Discharge status: Home / Self Care | Payer: Medicare HMO | Attending: Emergency Medicine | Admitting: Emergency Medicine

## 2016-06-13 DIAGNOSIS — Z791 Long term (current) use of non-steroidal anti-inflammatories (NSAID): Secondary | ICD-10-CM | POA: Diagnosis not present

## 2016-06-13 DIAGNOSIS — Z7984 Long term (current) use of oral hypoglycemic drugs: Secondary | ICD-10-CM | POA: Insufficient documentation

## 2016-06-13 DIAGNOSIS — Z794 Long term (current) use of insulin: Secondary | ICD-10-CM | POA: Diagnosis not present

## 2016-06-13 DIAGNOSIS — J45909 Unspecified asthma, uncomplicated: Secondary | ICD-10-CM | POA: Diagnosis not present

## 2016-06-13 DIAGNOSIS — Z79899 Other long term (current) drug therapy: Secondary | ICD-10-CM | POA: Insufficient documentation

## 2016-06-13 DIAGNOSIS — I10 Essential (primary) hypertension: Secondary | ICD-10-CM | POA: Diagnosis not present

## 2016-06-13 DIAGNOSIS — R5383 Other fatigue: Secondary | ICD-10-CM | POA: Insufficient documentation

## 2016-06-13 DIAGNOSIS — G43709 Chronic migraine without aura, not intractable, without status migrainosus: Secondary | ICD-10-CM | POA: Insufficient documentation

## 2016-06-13 DIAGNOSIS — E119 Type 2 diabetes mellitus without complications: Secondary | ICD-10-CM | POA: Insufficient documentation

## 2016-06-13 DIAGNOSIS — G43909 Migraine, unspecified, not intractable, without status migrainosus: Secondary | ICD-10-CM | POA: Diagnosis present

## 2016-06-13 LAB — CBG MONITORING, ED: GLUCOSE-CAPILLARY: 140 mg/dL — AB (ref 65–99)

## 2016-06-13 MED ORDER — KETOROLAC TROMETHAMINE 30 MG/ML IJ SOLN
30.0000 mg | Freq: Once | INTRAMUSCULAR | Status: AC
  Administered 2016-06-13: 30 mg via INTRAVENOUS
  Filled 2016-06-13: qty 1

## 2016-06-13 MED ORDER — DIPHENHYDRAMINE HCL 50 MG/ML IJ SOLN
25.0000 mg | Freq: Once | INTRAMUSCULAR | Status: AC
  Administered 2016-06-13: 25 mg via INTRAVENOUS
  Filled 2016-06-13: qty 1

## 2016-06-13 MED ORDER — SODIUM CHLORIDE 0.9 % IV BOLUS (SEPSIS)
1000.0000 mL | Freq: Once | INTRAVENOUS | Status: AC
  Administered 2016-06-13: 1000 mL via INTRAVENOUS

## 2016-06-13 MED ORDER — PROCHLORPERAZINE EDISYLATE 5 MG/ML IJ SOLN
10.0000 mg | Freq: Once | INTRAMUSCULAR | Status: AC
  Administered 2016-06-13: 10 mg via INTRAVENOUS
  Filled 2016-06-13: qty 2

## 2016-06-13 NOTE — ED Triage Notes (Signed)
Patient c/o intermittent migraine x2 weeks with nausea and vomiting x2 days. Patient reports taking Topamax with no relief. Per patient hx of migraines in which this feels like her "typical migraine." Patient does report sensitivity to light and sound and dizziness with movement. No active vomiting noted at this time. Patient is diabetic and has not checked blood sugar in 3 weeks.

## 2016-06-13 NOTE — ED Provider Notes (Signed)
[facility]Y DEPT Provider Note   CSN: [id number] Arrival date & time: 06/13/16  4315     History   Chief Complaint Chief Complaint  Patient presents with  . Migraine    HPI [name] is a 55 y.o. female presenting with a  2 week history of migraine which has been intermittent and only briefly controlled with her topamax. Her sx are associated with nausea, photophobia and phonophobia which has progressed to vomiting for the past 2 days, and has been unable to keep down any orals including her metoprolol (has now missed 2 days).  She has not taken her insulin the past 2 days and endorses has not checked her cbg in 3 weeks, stating she has a new glucometer and hasn't figured it out yet.  The patient has a history of migraine and the current symptoms are similar to previous episodes of migraine headache and has had long lasting headaches like this in the past.  She denies  prodromal scotoma.   There has been no fevers, chills, syncope, confusion or localized weakness, but endorses generalized fatigue from lack of sleep.  She also denies cp, sob, palpitations.  The history is provided by the patient and a relative.    Past Medical History:  Diagnosis Date  . Asthma   . Back pain   . Chest pain   . Diabetes mellitus type II   . Headache   . High cholesterol   . Hypertension   . Obesity, morbid (HCC)   . Other and unspecified bipolar disorders   . 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 Thyroid disease     Patient Active Problem List   Diagnosis Date Noted  . Bipolar disorder, unspecified (HCC) 02/28/2012  . Bipolar 1 disorder, depressed (HCC) 12/27/2011  . OBESITY, MORBID 07/07/2009  . OTHER AND UNSPECIFIED BIPOLAR DISORDERS 07/07/2009  . HYPERTENSION 07/07/2009  . ASTHMA 07/07/2009  . CHEST PAIN 07/07/2009    Past Surgical History:  Procedure Laterality Date  . TUBAL LIGATION     Bilateral    OB History    Gravida Para Term Preterm AB Living   2 2 2     2    SAB TAB Ectopic Multiple Live Births                   Home Medications    Prior to Admission medications   Medication Sig Start Date End Date Taking? Authorizing Provider  amLODipine (NORVASC) 10 MG tablet Take 10 mg by mouth daily.  09/04/13  Yes Historical Provider, MD  atorvastatin (LIPITOR) 80 MG tablet Take 80 mg by mouth daily.  11/11/14  Yes Historical Provider, MD  clonazePAM (KLONOPIN) 1 MG tablet Take 1 tablet (1 mg total) by mouth 2 (two) times daily as needed for anxiety. 04/13/16 04/13/17 Yes Myrlene Broker, MD  diclofenac (VOLTAREN) 75 MG EC tablet Take 75 mg by mouth 2 (two) times daily.  03/28/14  Yes Historical Provider, MD  insulin NPH-regular Human (NOVOLIN 70/30) (70-30) 100 UNIT/ML injection Inject 230 Units into the skin daily with breakfast.   Yes Historical Provider, MD  levothyroxine (SYNTHROID, LEVOTHROID) 50 MCG tablet Take 50 mcg by mouth daily before breakfast.  09/02/13  Yes Historical Provider, MD  lithium carbonate (LITHOBID) 300 MG CR tablet Take 1 in AM and 2 at bed time Patient taking differently: Take 300 mg by mouth at bedtime.  04/13/16  Yes Myrlene Broker, MD  Lurasidone HCl (LATUDA) 60 MG TABS Take  1 tablet (60 mg total) by mouth daily with supper. Patient taking differently: Take 60 mg by mouth daily.  06/02/16  Yes Myrlene Broker, MD  metFORMIN (GLUCOPHAGE) 1000 MG tablet Take 1,000 mg by mouth daily with breakfast.  03/14/14  Yes Historical Provider, MD  metoprolol succinate (TOPROL-XL) 50 MG 24 hr tablet Take 50 mg by mouth daily.  11/30/13  Yes Historical Provider, MD  montelukast (SINGULAIR) 10 MG tablet Take 10 mg by mouth at bedtime.   Yes Historical Provider, MD  topiramate (TOPAMAX) 50 MG tablet Take 50 mg by mouth 3 (three) times daily.  06/28/15  Yes Beryle Beams, MD  albuterol (PROVENTIL HFA;VENTOLIN HFA) 108 (90 BASE) MCG/ACT inhaler Inhale 2 puffs into the lungs every 6 (six) hours as needed for wheezing or shortness of breath.     Historical  Provider, MD  fluticasone (FLONASE) 50 MCG/ACT nasal spray Place 1 spray into the nose daily as needed for allergies.  09/04/13   Historical Provider, MD  zolpidem (AMBIEN) 10 MG tablet Take 1 tablet (10 mg total) by mouth at bedtime as needed for sleep. 05/24/16 06/23/16  Myrlene Broker, MD    Family History Family History  Problem Relation Age of Onset  . Heart attack Maternal Uncle   . Depression Maternal Uncle   . Depression Mother   . Alcohol abuse Maternal Grandfather   . Alcohol abuse Maternal Uncle   . Depression Maternal Grandmother     Social History Social History  Substance Use Topics  . Smoking status: Never Smoker  . Smokeless tobacco: Never Used  . Alcohol use No     Allergies   Doxycycline; Levofloxacin; and Propoxyphene n-acetaminophen   Review of Systems Review of Systems  Constitutional: Positive for fatigue. Negative for fever.  HENT: Negative for congestion and sore throat.   Eyes: Positive for photophobia and visual disturbance.  Respiratory: Negative for chest tightness and shortness of breath.   Cardiovascular: Negative for chest pain and palpitations.  Gastrointestinal: Positive for nausea and vomiting. Negative for abdominal pain.  Genitourinary: Negative.   Musculoskeletal: Negative for arthralgias, joint swelling, neck pain and neck stiffness.  Skin: Negative.  Negative for rash and wound.  Neurological: Positive for headaches. Negative for dizziness, seizures, speech difficulty, weakness, light-headedness and numbness.  Psychiatric/Behavioral: Negative.      Physical Exam Updated Vital Signs BP 159/83   Pulse 92   Temp 98.7 F (37.1 C) (Oral)   Resp 20   Ht 5\' 5"  (1.651 m)   Wt (!) 145.2 kg   SpO2 97%   BMI 53.25 kg/m   Physical Exam  Constitutional: She is oriented to person, place, and time. She appears well-developed and well-nourished.  Uncomfortable appearing  HENT:  Head: Normocephalic and atraumatic.  Mouth/Throat:  Oropharynx is clear and moist.  Eyes: EOM are normal. Pupils are equal, round, and reactive to light.  Neck: Normal range of motion. Neck supple.  Cardiovascular: Normal rate and normal heart sounds.   Pulmonary/Chest: Effort normal.  Abdominal: Soft. There is no tenderness.  Musculoskeletal: Normal range of motion.  Lymphadenopathy:    She has no cervical adenopathy.  Neurological: She is alert and oriented to person, place, and time. She has normal strength. No sensory deficit. Gait normal. GCS eye subscore is 4. GCS verbal subscore is 5. GCS motor subscore is 6.  Normal heel-shin, normal rapid alternating movements. Cranial nerves III-XII intact.  No pronator drift.  Skin: Skin is warm and dry. No rash  noted.  Psychiatric: She has a normal mood and affect. Her speech is normal and behavior is normal. Thought content normal. Cognition and memory are normal.  Nursing note and vitals reviewed.    ED Treatments / Results  Labs (all labs ordered are listed, but only abnormal results are displayed) Labs Reviewed  CBG MONITORING, ED - Abnormal; Notable for the following:       Result Value   Glucose-Capillary 140 (*)    All other components within normal limits    EKG  EKG Interpretation None       Radiology No results found.  Procedures Procedures (including critical care time)  Medications Ordered in ED Medications  sodium chloride 0.9 % bolus 1,000 mL (0 mLs Intravenous Stopped 06/13/16 1355)  prochlorperazine (COMPAZINE) injection 10 mg (10 mg Intravenous Given 06/13/16 1143)  diphenhydrAMINE (BENADRYL) injection 25 mg (25 mg Intravenous Given 06/13/16 1139)  ketorolac (TORADOL) 30 MG/ML injection 30 mg (30 mg Intravenous Given 06/13/16 1142)     Initial Impression / Assessment and Plan / ED Course  I have reviewed the triage vital signs and the nursing notes.  Pertinent labs & imaging results that were available during my care of the patient were reviewed by me and  considered in my medical decision making (see chart for details).  Clinical Course   Pt given migraine cocktail and IV fluids with improved headache at time of dc.  cbg checked and stable.  bp as elevated as 181/109 during this stay, improved prior to dc and she took a dose of her metoprolol prior to dc as well as tolerated PO intake.  Migraine c/w prior migraine hx, no sx or exam findings to suggest stroke or bleed.plan f/u with pcp prn.  The patient appears reasonably screened and/or stabilized for discharge and I doubt any other medical condition or other [facility] requiring further screening, evaluation, or treatment in the ED at this time prior to discharge.    Final Clinical Impressions(s) / ED Diagnoses   Final diagnoses:  Chronic migraine without aura without status migrainosus, not intractable    New Prescriptions Discharge Medication List as of 06/13/2016  1:03 PM       Burgess Amor, PA-C 06/15/16 1022    Samuel Jester, DO 06/16/16 1706

## 2016-06-14 ENCOUNTER — Ambulatory Visit (HOSPITAL_COMMUNITY): Payer: Self-pay | Admitting: Psychiatry

## 2016-06-21 ENCOUNTER — Ambulatory Visit (INDEPENDENT_AMBULATORY_CARE_PROVIDER_SITE_OTHER): Payer: Medicare HMO | Admitting: Psychiatry

## 2016-06-21 ENCOUNTER — Encounter (HOSPITAL_COMMUNITY): Payer: Self-pay | Admitting: Psychiatry

## 2016-06-21 ENCOUNTER — Other Ambulatory Visit (HOSPITAL_COMMUNITY): Payer: Self-pay | Admitting: Psychiatry

## 2016-06-21 DIAGNOSIS — F3130 Bipolar disorder, current episode depressed, mild or moderate severity, unspecified: Secondary | ICD-10-CM

## 2016-06-21 NOTE — Progress Notes (Signed)
   THERAPIST PROGRESS NOTE  Session Time:  Monday 06/21/2016 9:58 AM - 11:01 AM  Participation Level: Active  Behavioral Response: fairly groomed, depressed, tearful,   Type of Therapy: Individual Therapy  Treatment Goals:   1. Develop and maintain a pattern of regular rhythm to daily activities.     2. Identify and replace thoughts and behaviors that trigger manic or depressive symptoms.     3. Identify stages of grief and discuss stages patient has experienced.  Treatment Goals addressed: 1,3  Interventions: Supportive, CBT,   Summary: [name] is a 55 y.o. female who presents is referred for services by psychiatrist Dr. Lolly Mustache to improve coping skills. Patient reports she had been receiving psychotherapy services in [place] but stopped attending regularly due to difficulty driving in traffic. She is looking for services closer to home. Patient continues to see Dr. Lolly Mustache for medication management as she has a diagnosis of Bipolar Disorder. She reports stress related to husband who is an alcoholic. He is disabled and walks with a cane. He is demanding and controlling wanting patient to always be near him and do what he wants to do.  Her son-in-law also is an alcoholic and she worries about the way he is raising her 56 year old grandson.  She reports staying depressed. She says her sleep is terrible and memory is awful.    Patient last was seen 2 weeks ago.  in November 2016. She reports she stopped attending therapy due to insurance not covering treatment, Those issues have been resolved, She reports experiencing increased stress and depressed mood since husband died in 29-Jan-2016. She says husband had a massive stroke while he was visiting family in [place]. She reports having no closure as he was buried in [place] according to his wishes. However, patient could not afford to go to the funeral. She had a memorial service for him here at her church but says it wasn't the same. She  reports strong support from her daughters,pastor, church family, and friends. She reports additional grief and loss issues related to her dog dying last week. Patient reports little involvement in activity besides watching TV during the day. She also reports difficulty falling and staying asleep although taking trazadone. She will discuss with Dr. Tenny Craw.  Patient last was seen 2 weeks ago and reports continued anxiety, depressed mood, tearfulness, loss of appetite, sleep difficulty, and excessive worry since last session. She reports using daily planning forms and increasing involvement in activity for the first week but really not doing much of anything last week because she felt so depressed. She reports frustration she shakes constantly and plans to discuss with psychiatrist Dr. Tenny Craw tomorrow. She has made continued efforts to improve self care regarding daily hygiene and eating patterns. She continues to experience grief and loss issues.     Suicidal/Homicidal: No  Therapist Response: Reviewed symptoms, facilitated expression of feelings, discussed effects of use of daily planning versus not using daily planning, assisted patient identify her values and activities to pursue consistent with her values, continued to  process grief and loss issues related to loss of husband and pet, continued to discuss the stages of grief  Plan: Return again in 2 weeks.  Patient agrees to utilize strategies discussed in session,  continue daily planning, and bring items that remind her of her husband and her pet to next session.   Diagnosis: Axis I: Bipolar, mixed    Axis II: Deferred    Edmonia Gonser, LCSW 06/21/2016

## 2016-06-22 ENCOUNTER — Ambulatory Visit (HOSPITAL_COMMUNITY): Payer: Self-pay | Admitting: Psychiatry

## 2016-06-22 ENCOUNTER — Encounter (HOSPITAL_COMMUNITY): Payer: Self-pay

## 2016-06-28 ENCOUNTER — Ambulatory Visit (INDEPENDENT_AMBULATORY_CARE_PROVIDER_SITE_OTHER): Payer: Medicare HMO | Admitting: Psychiatry

## 2016-06-28 ENCOUNTER — Encounter (HOSPITAL_COMMUNITY): Payer: Self-pay | Admitting: Psychiatry

## 2016-06-28 VITALS — BP 157/61 | HR 71 | Ht 65.0 in

## 2016-06-28 DIAGNOSIS — F3162 Bipolar disorder, current episode mixed, moderate: Secondary | ICD-10-CM

## 2016-06-28 DIAGNOSIS — F3130 Bipolar disorder, current episode depressed, mild or moderate severity, unspecified: Secondary | ICD-10-CM | POA: Diagnosis not present

## 2016-06-28 MED ORDER — ZOLPIDEM TARTRATE 10 MG PO TABS: 10.0000 mg | ORAL_TABLET | Freq: Every evening | ORAL | 2 refills | Status: DC | PRN

## 2016-06-28 MED ORDER — CLONAZEPAM 1 MG PO TABS: 1.0000 mg | ORAL_TABLET | Freq: Two times a day (BID) | ORAL | 2 refills | Status: DC | PRN

## 2016-06-28 NOTE — Patient Instructions (Signed)
Decrease Latuda to 40 mg with dinner
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
## 2016-07-06 ENCOUNTER — Ambulatory Visit (HOSPITAL_COMMUNITY): Payer: Self-pay | Admitting: Psychiatry

## 2016-07-07 ENCOUNTER — Encounter (HOSPITAL_COMMUNITY): Payer: Self-pay | Admitting: Psychiatry

## 2016-07-07 ENCOUNTER — Ambulatory Visit (INDEPENDENT_AMBULATORY_CARE_PROVIDER_SITE_OTHER): Payer: Medicare HMO | Admitting: Psychiatry

## 2016-07-07 DIAGNOSIS — F3130 Bipolar disorder, current episode depressed, mild or moderate severity, unspecified: Secondary | ICD-10-CM

## 2016-07-07 NOTE — Progress Notes (Signed)
THERAPIST PROGRESS NOTE  Session Time:  04-Feb-2023 07/07/2016 10:20 AM -  11:05 AM                                  Participation Level: Active  Behavioral Response: fairly groomed, depressed, tearful,   Type of Therapy: Individual Therapy  Treatment Goals:   1. Develop and maintain a pattern of regular rhythm to daily activities.     2. Identify and replace thoughts and behaviors that trigger manic or depressive symptoms.     3. Identify stages of grief and discuss stages patient has experienced.  Treatment Goals addressed: 1,3  Interventions: Supportive, CBT,   Summary: [name] is a 55 y.o. female who presents is referred for services by psychiatrist Dr. Lolly Mustache to improve coping skills. Patient reports she had been receiving psychotherapy services in [employer] but stopped attending regularly due to difficulty driving in traffic. She is looking for services closer to home. Patient continues to see Dr. Lolly Mustache for medication management as she has a diagnosis of Bipolar Disorder. She reports stress related to husband who is an alcoholic. He is disabled and walks with a cane. He is demanding and controlling wanting patient to always be near him and do what he wants to do.  Her son-in-law also is an alcoholic and she worries about the way he is raising her 32 year old grandson.  She reports staying depressed. She says her sleep is terrible and memory is awful.    Patient last was seen 2 weeks ago.  in November 2016. She reports she stopped attending therapy due to insurance not covering treatment, Those issues have been resolved, She reports experiencing increased stress and depressed mood since husband died in Feb 04, 2016. She says husband had a massive stroke while he was visiting family in [place]. She reports having no closure as he was buried in [place] according to his wishes. However, patient could not afford to go to the funeral. She had a memorial service for him here at her  church but says it wasn't the same. She reports strong support from her daughters,pastor, church family, and friends. She reports additional grief and loss issues related to her dog dying last week. Patient reports little involvement in activity besides watching TV during the day. She also reports difficulty falling and staying asleep although taking trazadone. She will discuss with Dr. Tenny Craw.  Patient last was seen 2 weeks ago and reports less depressed mood ranking depression at 6/10 with 10 being severe. She reports increased involvement in activity including attending church on Feb 04, 2023 evenings as well as on Sundays and visiting her daughter more frequently. She reports she has gone to [employer]. She also prepared a meal for her children. She reports experiencing pleasure again and says she enjoyed celebrating her birthday with her family. One of her daughters cooked dinner. Patient plans to learn how to crochet. She also plans to start attending a Thursday night ladies' Bible study. She reports trying to go out and do something or read her bible when she finds herself feeling more depressed. She continues to experience sleep difficulty and reports only sleeping about 4 hours per night. She has sleep apnea but says she doesn't use CPAP machine as she feels like she is suffocating when she uses it. She has made continued efforts to improve self care regarding daily hygiene and eating patterns. She continues to experience grief and loss  issues.     Suicidal/Homicidal: No  Therapist Response: Reviewed symptoms, facilitated expression of feelings, praised and reinforced patient's efforts to pursue activities consistent with her values and increased involvement in activities, continued to  process grief and loss issues related to loss of husband using photograph patient brought to session, used nondirective technique to allow patient to express and process angry feelings connected to her loss, suggested  patient work with her doctor regarding CPAP  Plan: Return again in 2 weeks.  Patient agrees to utilize strategies discussed in session,  continue daily planning, and bring items that remind her of her husband and her pet to next session.   Diagnosis: Axis I: Bipolar, mixed    Axis II: Deferred    Marvette Schamp, LCSW 07/07/2016

## 2016-07-08 LAB — LITHIUM LEVEL: Lithium Lvl: 0.6 mmol/L — ABNORMAL LOW (ref 0.80–1.40)

## 2016-07-20 ENCOUNTER — Ambulatory Visit (HOSPITAL_COMMUNITY): Payer: Self-pay | Admitting: Psychiatry

## 2016-07-29 ENCOUNTER — Encounter (HOSPITAL_COMMUNITY): Payer: Self-pay | Admitting: Psychiatry

## 2016-07-29 ENCOUNTER — Ambulatory Visit (INDEPENDENT_AMBULATORY_CARE_PROVIDER_SITE_OTHER): Payer: Medicare HMO | Admitting: Psychiatry

## 2016-07-29 VITALS — BP 150/83 | HR 75 | Ht 65.0 in | Wt 295.0 lb

## 2016-07-29 DIAGNOSIS — Z818 Family history of other mental and behavioral disorders: Secondary | ICD-10-CM | POA: Diagnosis not present

## 2016-07-29 DIAGNOSIS — F3130 Bipolar disorder, current episode depressed, mild or moderate severity, unspecified: Secondary | ICD-10-CM

## 2016-07-29 DIAGNOSIS — Z8249 Family history of ischemic heart disease and other diseases of the circulatory system: Secondary | ICD-10-CM | POA: Diagnosis not present

## 2016-07-29 DIAGNOSIS — Z811 Family history of alcohol abuse and dependence: Secondary | ICD-10-CM | POA: Diagnosis not present

## 2016-07-29 DIAGNOSIS — F3162 Bipolar disorder, current episode mixed, moderate: Secondary | ICD-10-CM

## 2016-07-29 MED ORDER — LITHIUM CARBONATE ER 300 MG PO TBCR: EXTENDED_RELEASE_TABLET | ORAL | 2 refills | Status: DC
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
## 2016-08-05 ENCOUNTER — Encounter (HOSPITAL_COMMUNITY): Payer: Self-pay | Admitting: Psychiatry

## 2016-08-05 ENCOUNTER — Ambulatory Visit (INDEPENDENT_AMBULATORY_CARE_PROVIDER_SITE_OTHER): Payer: Medicare HMO | Admitting: Psychiatry

## 2016-08-05 DIAGNOSIS — F3130 Bipolar disorder, current episode depressed, mild or moderate severity, unspecified: Secondary | ICD-10-CM | POA: Diagnosis not present

## 2016-08-05 NOTE — Progress Notes (Signed)
THERAPIST PROGRESS NOTE  Session Time:  Thursday 08/05/2016 11:03 AM -  11:59 AM                             Participation Level: Active  Behavioral Response: fairly groomed, less depressed,   Type of Therapy: Individual Therapy  Treatment Goals:   1. Develop and maintain a pattern of regular rhythm to daily activities.     2. Identify and replace thoughts and behaviors that trigger manic or depressive symptoms.     3. Identify stages of grief and discuss stages patient has experienced.  Treatment Goals addressed: 1,3  Interventions: Supportive, CBT,   Summary: [name] is a 55 y.o. female who presents is referred for services by psychiatrist Dr. Lolly Mustache to improve coping skills. Patient reports she had been receiving psychotherapy services in [place] but stopped attending regularly due to difficulty driving in traffic. She is looking for services closer to home. Patient continues to see Dr. Lolly Mustache for medication management as she has a diagnosis of Bipolar Disorder. She reports stress related to husband who is an alcoholic. He is disabled and walks with a cane. He is demanding and controlling wanting patient to always be near him and do what he wants to do.  Her son-in-law also is an alcoholic and she worries about the way he is raising her 13 year old grandson.  She reports staying depressed. She says her sleep is terrible and memory is awful.    In November 2016. She reports she stopped attending therapy due to insurance not covering treatment, Those issues have been resolved, She reports experiencing increased stress and depressed mood since husband died in January 14, 2016. She says husband had a massive stroke while he was visiting family in [place]. She reports having no closure as he was buried in [place] according to his wishes. However, patient could not afford to go to the funeral. She had a memorial service for him here at her church but says it wasn't the same. She reports  strong support from her daughters,pastor, church family, and friends. She reports additional grief and loss issues related to her dog dying last week. Patient reports little involvement in activity besides watching TV during the day. She also reports difficulty falling and staying asleep although taking trazadone. She will discuss with Dr. Tenny Craw.  Patient last was seen 4 weeks ago and reports less depressed mood rating depression at 5/10 with 10 being severe. She reports continued involvement in activity including attending church, preparing meals for family, and doing light household tasks. She reports she has been walking more and has started going to [employer] frequently. She is excited about starting to attend a women's Bible study at [employer]. She continues to experience decreased appetite but reports improved sleep pattern although she still does not use her CPAP. She continues to experience grief and loss issues.     Suicidal/Homicidal: No  Therapist Response: Reviewed symptoms, facilitated expression of feelings, praised and reinforced patient's efforts to pursue activities consistent with her values and increased involvement in activities, assisted patient identify ways to improve self-care regarding nutrition, used nondirective technique to allow patient to express and process angry feelings connected to issues with deceased husband and assisted patient identify and verbalize underlying feelings beneath anger.    Plan: Return again in 2 weeks.  Patient agrees to utilize strategies discussed in session regarding self-care, continue daily planning.   Diagnosis: Axis I:  Bipolar, mixed    Axis II: Deferred    Char Feltman, LCSW 08/05/2016

## 2016-09-02 ENCOUNTER — Ambulatory Visit (INDEPENDENT_AMBULATORY_CARE_PROVIDER_SITE_OTHER): Payer: Medicare HMO | Admitting: Psychiatry

## 2016-09-02 ENCOUNTER — Encounter (HOSPITAL_COMMUNITY): Payer: Self-pay | Admitting: Psychiatry

## 2016-09-02 DIAGNOSIS — F3130 Bipolar disorder, current episode depressed, mild or moderate severity, unspecified: Secondary | ICD-10-CM | POA: Diagnosis not present
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
## 2016-09-09 ENCOUNTER — Encounter (HOSPITAL_COMMUNITY): Payer: Self-pay | Admitting: Psychiatry

## 2016-09-09 ENCOUNTER — Ambulatory Visit (INDEPENDENT_AMBULATORY_CARE_PROVIDER_SITE_OTHER): Payer: Medicare HMO | Admitting: Psychiatry

## 2016-09-09 VITALS — BP 164/91 | HR 98 | Ht 65.0 in | Wt 299.8 lb

## 2016-09-09 DIAGNOSIS — Z8249 Family history of ischemic heart disease and other diseases of the circulatory system: Secondary | ICD-10-CM

## 2016-09-09 DIAGNOSIS — Z818 Family history of other mental and behavioral disorders: Secondary | ICD-10-CM

## 2016-09-09 DIAGNOSIS — Z811 Family history of alcohol abuse and dependence: Secondary | ICD-10-CM | POA: Diagnosis not present

## 2016-09-09 DIAGNOSIS — F3130 Bipolar disorder, current episode depressed, mild or moderate severity, unspecified: Secondary | ICD-10-CM | POA: Diagnosis not present

## 2016-09-09 MED ORDER — ZOLPIDEM TARTRATE 10 MG PO TABS: 10.0000 mg | ORAL_TABLET | Freq: Every evening | ORAL | 2 refills | Status: DC | PRN

## 2016-09-09 MED ORDER — CLONAZEPAM 1 MG PO TABS: 1.0000 mg | ORAL_TABLET | Freq: Two times a day (BID) | ORAL | 2 refills | Status: DC | PRN
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
## 2016-10-04 ENCOUNTER — Encounter (HOSPITAL_COMMUNITY): Payer: Self-pay | Admitting: Psychiatry

## 2016-10-04 ENCOUNTER — Ambulatory Visit (INDEPENDENT_AMBULATORY_CARE_PROVIDER_SITE_OTHER): Payer: Medicare HMO | Admitting: Psychiatry

## 2016-10-04 DIAGNOSIS — F3130 Bipolar disorder, current episode depressed, mild or moderate severity, unspecified: Secondary | ICD-10-CM | POA: Diagnosis not present
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
## 2016-10-21 ENCOUNTER — Ambulatory Visit (INDEPENDENT_AMBULATORY_CARE_PROVIDER_SITE_OTHER): Payer: Medicare HMO | Admitting: Psychiatry

## 2016-10-21 ENCOUNTER — Encounter (HOSPITAL_COMMUNITY): Payer: Self-pay | Admitting: Psychiatry

## 2016-10-21 VITALS — BP 160/112 | HR 93 | Ht 65.0 in | Wt 307.4 lb

## 2016-10-21 DIAGNOSIS — Z9851 Tubal ligation status: Secondary | ICD-10-CM

## 2016-10-21 DIAGNOSIS — F3162 Bipolar disorder, current episode mixed, moderate: Secondary | ICD-10-CM

## 2016-10-21 DIAGNOSIS — F3130 Bipolar disorder, current episode depressed, mild or moderate severity, unspecified: Secondary | ICD-10-CM

## 2016-10-21 DIAGNOSIS — Z818 Family history of other mental and behavioral disorders: Secondary | ICD-10-CM | POA: Diagnosis not present

## 2016-10-21 DIAGNOSIS — Z811 Family history of alcohol abuse and dependence: Secondary | ICD-10-CM

## 2016-10-21 DIAGNOSIS — Z8249 Family history of ischemic heart disease and other diseases of the circulatory system: Secondary | ICD-10-CM | POA: Diagnosis not present

## 2016-10-21 MED ORDER — LURASIDONE HCL 40 MG PO TABS: 40.0000 mg | ORAL_TABLET | Freq: Every day | ORAL | 2 refills | Status: DC

## 2016-10-21 MED ORDER — CLONAZEPAM 1 MG PO TABS: 1.0000 mg | ORAL_TABLET | Freq: Two times a day (BID) | ORAL | 2 refills | Status: DC | PRN

## 2016-10-21 MED ORDER — ZOLPIDEM TARTRATE ER 12.5 MG PO TBCR: 12.5000 mg | EXTENDED_RELEASE_TABLET | Freq: Every evening | ORAL | 2 refills | Status: DC | PRN

## 2016-10-21 MED ORDER — LITHIUM CARBONATE ER 300 MG PO TBCR: EXTENDED_RELEASE_TABLET | ORAL | 2 refills | Status: DC

## 2016-10-21 MED ORDER — ESCITALOPRAM OXALATE 10 MG PO TABS: 10.0000 mg | ORAL_TABLET | Freq: Every day | ORAL | 2 refills | Status: DC
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
## 2016-11-22 ENCOUNTER — Encounter (HOSPITAL_COMMUNITY): Payer: Self-pay | Admitting: Psychiatry

## 2016-11-22 ENCOUNTER — Ambulatory Visit (INDEPENDENT_AMBULATORY_CARE_PROVIDER_SITE_OTHER): Payer: Medicare HMO | Admitting: Psychiatry

## 2016-11-22 VITALS — BP 170/98 | HR 88 | Resp 20 | Wt 305.0 lb

## 2016-11-22 DIAGNOSIS — Z818 Family history of other mental and behavioral disorders: Secondary | ICD-10-CM | POA: Diagnosis not present

## 2016-11-22 DIAGNOSIS — Z888 Allergy status to other drugs, medicaments and biological substances status: Secondary | ICD-10-CM

## 2016-11-22 DIAGNOSIS — Z794 Long term (current) use of insulin: Secondary | ICD-10-CM | POA: Diagnosis not present

## 2016-11-22 DIAGNOSIS — Z9851 Tubal ligation status: Secondary | ICD-10-CM | POA: Diagnosis not present

## 2016-11-22 DIAGNOSIS — Z79899 Other long term (current) drug therapy: Secondary | ICD-10-CM | POA: Diagnosis not present

## 2016-11-22 DIAGNOSIS — F3130 Bipolar disorder, current episode depressed, mild or moderate severity, unspecified: Secondary | ICD-10-CM | POA: Diagnosis not present

## 2016-11-22 DIAGNOSIS — Z8249 Family history of ischemic heart disease and other diseases of the circulatory system: Secondary | ICD-10-CM

## 2016-11-22 DIAGNOSIS — Z811 Family history of alcohol abuse and dependence: Secondary | ICD-10-CM

## 2016-11-22 MED ORDER — ESCITALOPRAM OXALATE 20 MG PO TABS: 20.0000 mg | ORAL_TABLET | Freq: Every day | ORAL | 2 refills | Status: DC

## 2016-11-22 NOTE — Patient Instructions (Signed)
DO NOT STOP OR CHANGE MEDICATION DOSAGES WITHOUT CONTACTING PHYSICIAN
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
## 2016-12-02 ENCOUNTER — Telehealth (HOSPITAL_COMMUNITY): Payer: Self-pay | Admitting: *Deleted

## 2016-12-02 NOTE — Telephone Encounter (Signed)
Faxed Latuda patient Geophysicist/field seismologist to [facility] [employer] [facility] [employer] today

## 2016-12-06 ENCOUNTER — Telehealth (HOSPITAL_COMMUNITY): Payer: Self-pay | Admitting: *Deleted

## 2016-12-06 NOTE — Telephone Encounter (Signed)
voice message from [place], [place] denied.   Patient has prescription drug plan.

## 2016-12-06 NOTE — Telephone Encounter (Signed)
voice message from [place], Latuda denied.   Patient has prescription drug plan. 

## 2016-12-15 ENCOUNTER — Ambulatory Visit (HOSPITAL_COMMUNITY): Payer: Self-pay | Admitting: Psychiatry

## 2017-01-03 ENCOUNTER — Ambulatory Visit (INDEPENDENT_AMBULATORY_CARE_PROVIDER_SITE_OTHER): Payer: Medicare HMO | Admitting: Psychiatry

## 2017-01-03 ENCOUNTER — Encounter (HOSPITAL_COMMUNITY): Payer: Self-pay | Admitting: Psychiatry

## 2017-01-03 VITALS — BP 168/112 | HR 98 | Ht 65.0 in | Wt 312.2 lb

## 2017-01-03 DIAGNOSIS — Z818 Family history of other mental and behavioral disorders: Secondary | ICD-10-CM

## 2017-01-03 DIAGNOSIS — Z79899 Other long term (current) drug therapy: Secondary | ICD-10-CM

## 2017-01-03 DIAGNOSIS — Z811 Family history of alcohol abuse and dependence: Secondary | ICD-10-CM

## 2017-01-03 DIAGNOSIS — F3130 Bipolar disorder, current episode depressed, mild or moderate severity, unspecified: Secondary | ICD-10-CM

## 2017-01-03 MED ORDER — ZOLPIDEM TARTRATE ER 12.5 MG PO TBCR: 12.5000 mg | EXTENDED_RELEASE_TABLET | Freq: Every evening | ORAL | 2 refills | Status: DC | PRN

## 2017-01-03 MED ORDER — CLONAZEPAM 1 MG PO TABS: 1.0000 mg | ORAL_TABLET | Freq: Two times a day (BID) | ORAL | 2 refills | Status: DC | PRN

## 2017-01-03 MED ORDER — ESCITALOPRAM OXALATE 20 MG PO TABS: 40.0000 mg | ORAL_TABLET | Freq: Every day | ORAL | 2 refills | Status: DC
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
## 2017-01-07 ENCOUNTER — Telehealth (HOSPITAL_COMMUNITY): Payer: Self-pay | Admitting: *Deleted

## 2017-01-07 NOTE — Telephone Encounter (Signed)
Prior authorization for Lexapro received. Submitted online with cover my meds. Case [id number]. Decision pending.

## 2017-01-11 NOTE — Telephone Encounter (Signed)
noted 

## 2017-01-13 ENCOUNTER — Telehealth (HOSPITAL_COMMUNITY): Payer: Self-pay | Admitting: *Deleted

## 2017-01-13 NOTE — Telephone Encounter (Signed)
Received note from [facility] pharmacy asking for clarification of lexapro dosing. Pharmacist wanted clarification that patient is taking 40mg  daily. Authorization approved online with covermymeds. .

## 2017-01-14 NOTE — Telephone Encounter (Signed)
noted 

## 2017-02-17 ENCOUNTER — Telehealth (HOSPITAL_COMMUNITY): Payer: Self-pay | Admitting: *Deleted

## 2017-02-17 ENCOUNTER — Encounter (HOSPITAL_COMMUNITY): Payer: Self-pay | Admitting: *Deleted

## 2017-02-17 NOTE — Telephone Encounter (Signed)
ok 

## 2017-02-17 NOTE — Progress Notes (Signed)
Provider provided Latuda samples for pt. LOT number [id number] exp 04/22. 4 packs provided and in each there are 7 tabs. Pt showed understanding.

## 2017-02-17 NOTE — Telephone Encounter (Signed)
Pt walked into office stating she would like samples for Latuda 40 mg. Per pt she runs out of her samples on Sunday. Per pt chart, PA was approved for Latuda. Pt is aware and stated that her out of pocket is still about $500.00. Per pt she applied for the Patient Assistance program for [facility] and she was denied due to having insurance. Per pt she needs refills before her f/u appt and her appt is 02-28-2017. Pt is in the lobby waiting for samples.

## 2017-02-18 NOTE — Telephone Encounter (Signed)
Samples were given to pt and she showed understanding.

## 2017-02-28 ENCOUNTER — Ambulatory Visit (INDEPENDENT_AMBULATORY_CARE_PROVIDER_SITE_OTHER): Payer: Medicare HMO | Admitting: Psychiatry

## 2017-02-28 ENCOUNTER — Encounter (HOSPITAL_COMMUNITY): Payer: Self-pay | Admitting: Psychiatry

## 2017-02-28 VITALS — BP 118/68 | HR 77 | Ht 64.0 in | Wt 317.0 lb

## 2017-02-28 DIAGNOSIS — Z818 Family history of other mental and behavioral disorders: Secondary | ICD-10-CM | POA: Diagnosis not present

## 2017-02-28 DIAGNOSIS — Z811 Family history of alcohol abuse and dependence: Secondary | ICD-10-CM | POA: Diagnosis not present

## 2017-02-28 DIAGNOSIS — F3130 Bipolar disorder, current episode depressed, mild or moderate severity, unspecified: Secondary | ICD-10-CM | POA: Diagnosis not present

## 2017-02-28 MED ORDER — ESCITALOPRAM OXALATE 20 MG PO TABS: 40.0000 mg | ORAL_TABLET | Freq: Every day | ORAL | 2 refills | Status: DC

## 2017-02-28 MED ORDER — CLONAZEPAM 1 MG PO TABS: 1.0000 mg | ORAL_TABLET | Freq: Two times a day (BID) | ORAL | 2 refills | Status: DC | PRN

## 2017-02-28 MED ORDER — ZOLPIDEM TARTRATE ER 12.5 MG PO TBCR: 12.5000 mg | EXTENDED_RELEASE_TABLET | Freq: Every evening | ORAL | 2 refills | Status: DC | PRN
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
## 2017-04-14 ENCOUNTER — Telehealth (HOSPITAL_COMMUNITY): Payer: Self-pay | Admitting: *Deleted

## 2017-04-14 NOTE — Telephone Encounter (Signed)
Ok to give samples

## 2017-04-14 NOTE — Telephone Encounter (Signed)
voice message from patient, she is returning [place] phone call.

## 2017-04-14 NOTE — Telephone Encounter (Signed)
Pt walked in wanting samples for her Latuda 40 mg. Pt f/u appt is 04-29-2017. Pt was informed that message will be put back to provider. Pt number is [phone] and [phone]

## 2017-04-18 NOTE — Telephone Encounter (Signed)
Spoke with pt and she stated she will pick up samples on Wednesday.

## 2017-04-20 ENCOUNTER — Telehealth (HOSPITAL_COMMUNITY): Payer: Self-pay | Admitting: *Deleted

## 2017-04-20 NOTE — Telephone Encounter (Signed)
Spoke with pt and she wanted samples for her [place]

## 2017-04-20 NOTE — Telephone Encounter (Signed)
DL number [id number] exp 21-1-94. LOT [id number] exp 12-2020. Latuda 40 mg 4 cards given which has 7 tabs in each card.

## 2017-04-20 NOTE — Telephone Encounter (Signed)
Pt came by office to pick up her samples for Latuda 40 mg.

## 2017-04-29 ENCOUNTER — Ambulatory Visit (INDEPENDENT_AMBULATORY_CARE_PROVIDER_SITE_OTHER): Payer: Medicare HMO | Admitting: Psychiatry

## 2017-04-29 ENCOUNTER — Encounter (HOSPITAL_COMMUNITY): Payer: Self-pay | Admitting: Psychiatry

## 2017-04-29 VITALS — BP 159/97 | HR 66 | Ht 64.0 in | Wt 308.0 lb

## 2017-04-29 DIAGNOSIS — R51 Headache: Secondary | ICD-10-CM | POA: Diagnosis not present

## 2017-04-29 DIAGNOSIS — G4700 Insomnia, unspecified: Secondary | ICD-10-CM | POA: Diagnosis not present

## 2017-04-29 DIAGNOSIS — F3130 Bipolar disorder, current episode depressed, mild or moderate severity, unspecified: Secondary | ICD-10-CM

## 2017-04-29 DIAGNOSIS — M549 Dorsalgia, unspecified: Secondary | ICD-10-CM

## 2017-04-29 DIAGNOSIS — Z811 Family history of alcohol abuse and dependence: Secondary | ICD-10-CM | POA: Diagnosis not present

## 2017-04-29 DIAGNOSIS — Z560 Unemployment, unspecified: Secondary | ICD-10-CM | POA: Diagnosis not present

## 2017-04-29 DIAGNOSIS — Z818 Family history of other mental and behavioral disorders: Secondary | ICD-10-CM

## 2017-04-29 MED ORDER — ZOLPIDEM TARTRATE ER 12.5 MG PO TBCR: 12.5000 mg | EXTENDED_RELEASE_TABLET | Freq: Every evening | ORAL | 2 refills | Status: DC | PRN
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
## 2017-06-29 ENCOUNTER — Ambulatory Visit (HOSPITAL_COMMUNITY): Payer: Self-pay | Admitting: Psychiatry

## 2017-07-12 ENCOUNTER — Encounter (HOSPITAL_COMMUNITY): Payer: Self-pay | Admitting: Psychiatry

## 2017-07-12 ENCOUNTER — Ambulatory Visit (INDEPENDENT_AMBULATORY_CARE_PROVIDER_SITE_OTHER): Payer: Medicare HMO | Admitting: Psychiatry

## 2017-07-12 VITALS — BP 151/89 | HR 78 | Ht 64.0 in | Wt 303.0 lb

## 2017-07-12 DIAGNOSIS — Z818 Family history of other mental and behavioral disorders: Secondary | ICD-10-CM | POA: Diagnosis not present

## 2017-07-12 DIAGNOSIS — F419 Anxiety disorder, unspecified: Secondary | ICD-10-CM

## 2017-07-12 DIAGNOSIS — R51 Headache: Secondary | ICD-10-CM | POA: Diagnosis not present

## 2017-07-12 DIAGNOSIS — G4700 Insomnia, unspecified: Secondary | ICD-10-CM

## 2017-07-12 DIAGNOSIS — F3130 Bipolar disorder, current episode depressed, mild or moderate severity, unspecified: Secondary | ICD-10-CM

## 2017-07-12 DIAGNOSIS — Z560 Unemployment, unspecified: Secondary | ICD-10-CM | POA: Diagnosis not present

## 2017-07-12 DIAGNOSIS — R4584 Anhedonia: Secondary | ICD-10-CM

## 2017-07-12 DIAGNOSIS — R5383 Other fatigue: Secondary | ICD-10-CM | POA: Diagnosis not present

## 2017-07-12 DIAGNOSIS — M549 Dorsalgia, unspecified: Secondary | ICD-10-CM | POA: Diagnosis not present

## 2017-07-12 DIAGNOSIS — Z811 Family history of alcohol abuse and dependence: Secondary | ICD-10-CM | POA: Diagnosis not present

## 2017-07-12 DIAGNOSIS — E119 Type 2 diabetes mellitus without complications: Secondary | ICD-10-CM | POA: Diagnosis not present

## 2017-07-12 DIAGNOSIS — Z736 Limitation of activities due to disability: Secondary | ICD-10-CM

## 2017-07-12 DIAGNOSIS — R450 Nervousness: Secondary | ICD-10-CM

## 2017-07-12 MED ORDER — ALPRAZOLAM 1 MG PO TABS: 1.0000 mg | ORAL_TABLET | Freq: Two times a day (BID) | ORAL | 2 refills | Status: DC | PRN

## 2017-07-12 MED ORDER — ZOLPIDEM TARTRATE ER 12.5 MG PO TBCR: 12.5000 mg | EXTENDED_RELEASE_TABLET | Freq: Every evening | ORAL | 2 refills | Status: DC | PRN

## 2017-07-12 MED ORDER — ESCITALOPRAM OXALATE 20 MG PO TABS: 60.0000 mg | ORAL_TABLET | Freq: Every day | ORAL | 2 refills | Status: DC
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
## 2017-07-13 ENCOUNTER — Emergency Department (HOSPITAL_COMMUNITY)
Admission: EM | Admit: 2017-07-13 | Discharge: 2017-07-13 | Disposition: A | Discharge status: Home / Self Care | Payer: Medicare HMO | Attending: Emergency Medicine | Admitting: Emergency Medicine

## 2017-07-13 ENCOUNTER — Emergency Department (HOSPITAL_COMMUNITY): Payer: Medicare HMO

## 2017-07-13 ENCOUNTER — Encounter (HOSPITAL_COMMUNITY): Payer: Self-pay | Admitting: Emergency Medicine

## 2017-07-13 DIAGNOSIS — E7800 Pure hypercholesterolemia, unspecified: Secondary | ICD-10-CM | POA: Diagnosis not present

## 2017-07-13 DIAGNOSIS — Z79899 Other long term (current) drug therapy: Secondary | ICD-10-CM | POA: Insufficient documentation

## 2017-07-13 DIAGNOSIS — J45909 Unspecified asthma, uncomplicated: Secondary | ICD-10-CM | POA: Insufficient documentation

## 2017-07-13 DIAGNOSIS — I10 Essential (primary) hypertension: Secondary | ICD-10-CM | POA: Insufficient documentation

## 2017-07-13 DIAGNOSIS — Z794 Long term (current) use of insulin: Secondary | ICD-10-CM | POA: Insufficient documentation

## 2017-07-13 DIAGNOSIS — R739 Hyperglycemia, unspecified: Secondary | ICD-10-CM

## 2017-07-13 DIAGNOSIS — R4182 Altered mental status, unspecified: Secondary | ICD-10-CM | POA: Diagnosis not present

## 2017-07-13 DIAGNOSIS — T424X1A Poisoning by benzodiazepines, accidental (unintentional), initial encounter: Secondary | ICD-10-CM | POA: Insufficient documentation

## 2017-07-13 DIAGNOSIS — E1165 Type 2 diabetes mellitus with hyperglycemia: Secondary | ICD-10-CM | POA: Insufficient documentation

## 2017-07-13 LAB — HEPATIC FUNCTION PANEL
ALBUMIN: 3.9 g/dL (ref 3.5–5.0)
ALT: 22 U/L (ref 14–54)
AST: 20 U/L (ref 15–41)
Alkaline Phosphatase: 84 U/L (ref 38–126)
BILIRUBIN DIRECT: 0.1 mg/dL (ref 0.1–0.5)
Indirect Bilirubin: 0.6 mg/dL (ref 0.3–0.9)
TOTAL PROTEIN: 7.1 g/dL (ref 6.5–8.1)
Total Bilirubin: 0.7 mg/dL (ref 0.3–1.2)

## 2017-07-13 LAB — BASIC METABOLIC PANEL
Anion gap: 12 (ref 5–15)
BUN: 23 mg/dL — ABNORMAL HIGH (ref 6–20)
CALCIUM: 9.2 mg/dL (ref 8.9–10.3)
CO2: 22 mmol/L (ref 22–32)
CREATININE: 0.85 mg/dL (ref 0.44–1.00)
Chloride: 100 mmol/L — ABNORMAL LOW (ref 101–111)
Glucose, Bld: 344 mg/dL — ABNORMAL HIGH (ref 65–99)
Potassium: 3.8 mmol/L (ref 3.5–5.1)
SODIUM: 134 mmol/L — AB (ref 135–145)

## 2017-07-13 LAB — CBG MONITORING, ED
GLUCOSE-CAPILLARY: 306 mg/dL — AB (ref 65–99)
Glucose-Capillary: 261 mg/dL — ABNORMAL HIGH (ref 65–99)

## 2017-07-13 LAB — ETHANOL

## 2017-07-13 LAB — URINALYSIS, ROUTINE W REFLEX MICROSCOPIC
Bacteria, UA: NONE SEEN
Bilirubin Urine: NEGATIVE
Glucose, UA: >=500 mg/dL — AB
HGB URINE DIPSTICK: NEGATIVE
Ketones, ur: NEGATIVE mg/dL
Leukocytes, UA: NEGATIVE
Nitrite: NEGATIVE
PROTEIN: NEGATIVE mg/dL
SPECIFIC GRAVITY, URINE: 1.008 (ref 1.005–1.030)
pH: 7 (ref 5.0–8.0)

## 2017-07-13 LAB — CBC WITH DIFFERENTIAL/PLATELET
BASOS ABS: 0 10*3/uL (ref 0.0–0.1)
BASOS PCT: 1 %
EOS ABS: 0 10*3/uL (ref 0.0–0.7)
Eosinophils Relative: 1 %
HCT: 36.2 % (ref 36.0–46.0)
HEMOGLOBIN: 12.6 g/dL (ref 12.0–15.0)
Lymphocytes Relative: 16 %
Lymphs Abs: 1.3 10*3/uL (ref 0.7–4.0)
MCH: 29.4 pg (ref 26.0–34.0)
MCHC: 34.8 g/dL (ref 30.0–36.0)
MCV: 84.6 fL (ref 78.0–100.0)
MONOS PCT: 8 %
Monocytes Absolute: 0.6 10*3/uL (ref 0.1–1.0)
NEUTROS PCT: 75 %
Neutro Abs: 6.1 10*3/uL (ref 1.7–7.7)
Platelets: 279 10*3/uL (ref 150–400)
RBC: 4.28 MIL/uL (ref 3.87–5.11)
RDW: 12.6 % (ref 11.5–15.5)
WBC: 8.1 10*3/uL (ref 4.0–10.5)

## 2017-07-13 LAB — RAPID URINE DRUG SCREEN, HOSP PERFORMED
AMPHETAMINES: NOT DETECTED
BENZODIAZEPINES: POSITIVE — AB
Barbiturates: NOT DETECTED
COCAINE: NOT DETECTED
Opiates: NOT DETECTED
Tetrahydrocannabinol: NOT DETECTED

## 2017-07-13 LAB — LIPASE, BLOOD: Lipase: 31 U/L (ref 11–51)

## 2017-07-13 LAB — ACETAMINOPHEN LEVEL

## 2017-07-13 MED ORDER — SODIUM CHLORIDE 0.9 % IV SOLN
INTRAVENOUS | Status: DC
  Administered 2017-07-13: 15:00:00 via INTRAVENOUS

## 2017-07-13 MED ORDER — SODIUM CHLORIDE 0.9 % IV BOLUS (SEPSIS)
1000.0000 mL | Freq: Once | INTRAVENOUS | Status: AC
  Administered 2017-07-13: 1000 mL via INTRAVENOUS

## 2017-07-13 NOTE — ED Provider Notes (Signed)
[facility] EMERGENCY DEPARTMENT Provider Note   CSN: [id number] Arrival date & time: 07/13/17  1430     History   Chief Complaint Chief Complaint  Patient presents with  . Hyperglycemia    HPI [name] is a 56 y.o. female.  Patient brought in by EMS. Family member called EMS as patient was very lethargic and not responding normally. Patient is known to have a history of taking too much Xanax. Family stated that the Xanax bottle was empty. Family stated that patient when she gets all the Xanax takes it all at once.Patient has a history of diabetes and history of bipolar disorder. Patient will awaken if shaken. And she will talk some. Then drifts back off to sleep.      Past Medical History:  Diagnosis Date  . Asthma   . Back pain   . Chest pain   . Diabetes mellitus type II   . Headache   . High cholesterol   . Hypertension   . Obesity, morbid (HCC)   . Other and unspecified bipolar disorders   . 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 Thyroid disease     Patient Active Problem List   Diagnosis Date Noted  . Bipolar disorder, unspecified (HCC) 02/28/2012  . Bipolar 1 disorder, depressed (HCC) 12/27/2011  . OBESITY, MORBID 07/07/2009  . OTHER AND UNSPECIFIED BIPOLAR DISORDERS 07/07/2009  . HYPERTENSION 07/07/2009  . ASTHMA 07/07/2009  . CHEST PAIN 07/07/2009    Past Surgical History:  Procedure Laterality Date  . TUBAL LIGATION     Bilateral    OB History    Gravida Para Term Preterm AB Living   2 2 2     2    SAB TAB Ectopic Multiple Live Births                   Home Medications    Prior to Admission medications   Medication Sig Start Date End Date Taking? Authorizing Provider  albuterol (PROVENTIL HFA;VENTOLIN HFA) 108 (90 BASE) MCG/ACT inhaler Inhale 2 puffs into the lungs every 6 (six) hours as needed for wheezing or shortness of breath.    Yes [provider]  ALPRAZolam Prudy Feeler(XANAX) 1 MG tablet Take 1 tablet (1 mg total) by mouth 2  (two) times daily as needed for anxiety. 07/12/17 07/12/18 Yes Myrlene Brokeross, Deborah R, MD  amLODipine (NORVASC) 10 MG tablet Take 10 mg by mouth daily.  09/04/13  Yes [provider]  atorvastatin (LIPITOR) 80 MG tablet Take 80 mg by mouth every morning.  11/11/14  Yes [provider]  co-enzyme Q-10 30 MG capsule Take 60 mg by mouth every morning.   Yes [provider]  escitalopram (LEXAPRO) 20 MG tablet Take 3 tablets (60 mg total) by mouth daily. 07/12/17  Yes Myrlene Brokeross, Deborah R, MD  fluticasone [facility](FLONASE) 50 MCG/ACT nasal spray Place 1 spray into the nose daily as needed for allergies.  09/04/13  Yes [provider]  hydrochlorothiazide (MICROZIDE) 12.5 MG capsule Take 12.5 mg by mouth daily. 07/08/17  Yes [provider]  insulin NPH-regular Human (NOVOLIN 70/30) (70-30) 100 UNIT/ML injection Inject 300 Units into the skin daily with breakfast.    Yes [provider]  levothyroxine (SYNTHROID, LEVOTHROID) 50 MCG tablet Take 50 mcg by mouth daily before breakfast.  09/02/13  Yes [provider]  lurasidone (LATUDA) 40 MG TABS tablet Take 1 tablet (40 mg total) by mouth daily after supper. 10/21/16  Yes Myrlene Brokeross, Deborah R, MD  metFORMIN (GLUCOPHAGE-XR) 750 MG 24 hr tablet Take 750 mg by mouth daily with breakfast.   Yes [provider]  metoprolol succinate (TOPROL-XL) 50 MG 24 hr tablet Take 50 mg by mouth daily.  11/30/13  Yes [provider]  montelukast (SINGULAIR) 10 MG tablet Take 10 mg by mouth at bedtime.   Yes [provider]  Multiple Vitamin (MULTIVITAMIN WITH MINERALS) TABS tablet Take 1 tablet by mouth daily.   Yes [provider]  topiramate (TOPAMAX) 50 MG tablet Take 50 mg by mouth 3 (three) times daily.  06/28/15  Yes [place], Darleen Crocker, MD  zolpidem (AMBIEN CR) 12.5 MG CR tablet Take 1 tablet (12.5 mg total) by mouth at bedtime as needed for sleep. 07/12/17 07/12/18 Yes Myrlene Broker, MD    Family  History Family History  Problem Relation Age of Onset  . Heart attack Maternal Uncle   . Depression Maternal Uncle   . Depression Mother   . Alcohol abuse Maternal Grandfather   . Alcohol abuse Maternal Uncle   . Depression Maternal Grandmother     Social History Social History  Substance Use Topics  . Smoking status: Never Smoker  . Smokeless tobacco: Never Used  . Alcohol use No     Allergies   Doxycycline; Levofloxacin; and Propoxyphene n-acetaminophen   Review of Systems Review of Systems  Unable to perform ROS: Mental status change     Physical Exam Updated Vital Signs BP (!) 162/70   Pulse 88   Temp 98.4 F (36.9 C) (Oral)   Resp (!) 23   SpO2 98%   Physical Exam  Constitutional: She appears well-developed and well-nourished. She appears distressed.  HENT:  Head: Normocephalic and atraumatic.  Mucous membranes dry.  Eyes: Pupils are equal, round, and reactive to light. EOM are normal.  Neck: Normal range of motion. Neck supple.  Cardiovascular: Normal rate and regular rhythm.   Pulmonary/Chest: Effort normal and breath sounds normal. No respiratory distress. She has no wheezes.  Abdominal: Soft. Bowel sounds are normal. She exhibits no distension. There is no tenderness.  Musculoskeletal: Normal range of motion.  Neurological:  Very drowsy. Will verbalize some. We'll follow commands.  Skin: Skin is warm.  Nursing note and vitals reviewed.    ED Treatments / Results  Labs (all labs ordered are listed, but only abnormal results are displayed) Labs Reviewed  BASIC METABOLIC PANEL - Abnormal; Notable for the following:       Result Value   Sodium 134 (*)    Chloride 100 (*)    Glucose, Bld 344 (*)    BUN 23 (*)    All other components within normal limits  RAPID URINE DRUG SCREEN, HOSP PERFORMED - Abnormal; Notable for the following:    Benzodiazepines POSITIVE (*)    All other components within normal limits  URINALYSIS, ROUTINE W REFLEX  MICROSCOPIC - Abnormal; Notable for the following:    Color, Urine STRAW (*)    Glucose, UA >=500 (*)    Squamous Epithelial / LPF 0-5 (*)    All other components within normal limits  ACETAMINOPHEN LEVEL - Abnormal; Notable for the following:    Acetaminophen (Tylenol), Serum <10 (*)    All other components within normal limits  CBG MONITORING, ED - Abnormal; Notable for the following:    Glucose-Capillary 306 (*)    All other components within normal limits  CBG MONITORING, ED - Abnormal; Notable for the following:    Glucose-Capillary 261 (*)  All other components within normal limits  CBC WITH DIFFERENTIAL/PLATELET  LIPASE, BLOOD  ETHANOL  HEPATIC FUNCTION PANEL    EKG  EKG Interpretation  Date/Time:  Wednesday July 13 2017 15:19:26 EDT Ventricular Rate:  88 PR Interval:    QRS Duration: 96 QT Interval:  404 QTC Calculation: 489 R Axis:   62 Text Interpretation:  Sinus rhythm Borderline T abnormalities, diffuse leads Borderline prolonged QT interval Interpretation limited secondary to artifact Confirmed by Vanetta Mulders [phone]) on 07/13/2017 5:08:17 PM       Radiology Ct Head Wo Contrast  Result Date: 07/13/2017 CLINICAL DATA:  Altered level of consciousness. EXAM: CT HEAD WITHOUT CONTRAST TECHNIQUE: Contiguous axial images were obtained from the base of the skull through the vertex without intravenous contrast. COMPARISON:  CT head 03/01/2005 FINDINGS: Brain: No evidence of acute infarction, hemorrhage, hydrocephalus, extra-axial collection or mass lesion/mass effect. Vascular: Negative for hyperdense vessel Skull: Negative Sinuses/Orbits: Mild mucosal edema paranasal sinuses.  Normal orbit. Other: None IMPRESSION: Negative CT head Electronically Signed   By: Marlan Palau M.D.   On: 07/13/2017 18:45   Dg Chest Port 1 View  Result Date: 07/13/2017 CLINICAL DATA:  Altered mental status EXAM: PORTABLE CHEST 1 VIEW COMPARISON:  12/01/2016 FINDINGS: Borderline  cardiomegaly. No acute consolidation or effusion. No pneumothorax. IMPRESSION: Borderline cardiomegaly.  Negative for edema or infiltrate. Electronically Signed   By: Jasmine Pang M.D.   On: 07/13/2017 17:46    Procedures Procedures (including critical care time)  Medications Ordered in ED Medications  0.9 %  sodium chloride infusion ( Intravenous Stopped 07/13/17 1937)  sodium chloride 0.9 % bolus 1,000 mL (0 mLs Intravenous Stopped 07/13/17 1609)     Initial Impression / Assessment and Plan / ED Course  I have reviewed the triage vital signs and the nursing notes.  Pertinent labs & imaging results that were available during my care of the patient were reviewed by me and considered in my medical decision making (see chart for details).   patient treated with IV fluids. Possible overdose labs ordered.Urine drug screen positive for benzo diazepam only. Patient over time became more alert and then ventrally became very functional and able to ambulate. Patient denied any suicidal ideation. Denied taking anything other than her Xanax.  CT had chest x-ray without any acute findings. Labs without any significant abnormalities other than her blood sugar was initially elevated. With IV fluids no pretreatment blood sugar came down to the 200s at time of discharge.    Final Clinical Impressions(s) / ED Diagnoses   Final diagnoses:  Altered mental status, unspecified altered mental status type  Benzodiazepine overdose, accidental or unintentional, initial encounter  Hyperglycemia    New Prescriptions New Prescriptions   No medications on file     Vanetta Mulders, MD 07/13/17 2159

## 2017-07-13 NOTE — ED Notes (Signed)
Pt is alert/oriented. No slurred speech noted. Denies pain. Pt states she is here bc her sugar was over 400 but she feels better now.

## 2017-07-13 NOTE — ED Notes (Signed)
Daughter , Lyla Son stated pt knows exactly what to say to get admitted or go home and to get certain meds. Daughter also states pt goes to different doctors and tell them different things to get certain prescriptions.

## 2017-07-13 NOTE — ED Triage Notes (Signed)
cbg 423 In route. Pt lethargic. Family called EMS.  Pt was missing xanax out of bottle also. Daughter told ems that everytime she gets her meds she takes a bunch.

## 2017-07-13 NOTE — ED Notes (Signed)
Pt given bed bath by Holley Dexter and Rueben Bash, NT. In and out cath performed and pt placed on pure wick.

## 2017-07-13 NOTE — Discharge Instructions (Signed)
Take her Xanax as directed. Do not take too much. Follow-up with your our doctor to have blood sugar rechecked next week. Return for any new or worse symptoms.

## 2017-08-09 ENCOUNTER — Encounter (HOSPITAL_COMMUNITY): Payer: Self-pay | Admitting: Psychiatry

## 2017-08-09 ENCOUNTER — Ambulatory Visit (HOSPITAL_COMMUNITY): Payer: Medicare HMO | Admitting: Psychiatry

## 2017-08-09 VITALS — BP 144/94 | HR 69 | Ht 64.0 in | Wt 297.0 lb

## 2017-08-09 DIAGNOSIS — M549 Dorsalgia, unspecified: Secondary | ICD-10-CM

## 2017-08-09 DIAGNOSIS — G4700 Insomnia, unspecified: Secondary | ICD-10-CM

## 2017-08-09 DIAGNOSIS — Z818 Family history of other mental and behavioral disorders: Secondary | ICD-10-CM | POA: Diagnosis not present

## 2017-08-09 DIAGNOSIS — Z811 Family history of alcohol abuse and dependence: Secondary | ICD-10-CM

## 2017-08-09 DIAGNOSIS — F419 Anxiety disorder, unspecified: Secondary | ICD-10-CM

## 2017-08-09 DIAGNOSIS — Z560 Unemployment, unspecified: Secondary | ICD-10-CM | POA: Diagnosis not present

## 2017-08-09 DIAGNOSIS — R109 Unspecified abdominal pain: Secondary | ICD-10-CM | POA: Diagnosis not present

## 2017-08-09 DIAGNOSIS — R45851 Suicidal ideations: Secondary | ICD-10-CM | POA: Diagnosis not present

## 2017-08-09 DIAGNOSIS — Z736 Limitation of activities due to disability: Secondary | ICD-10-CM

## 2017-08-09 DIAGNOSIS — R450 Nervousness: Secondary | ICD-10-CM

## 2017-08-09 DIAGNOSIS — F3130 Bipolar disorder, current episode depressed, mild or moderate severity, unspecified: Secondary | ICD-10-CM

## 2017-08-09 MED ORDER — FLUOXETINE HCL 40 MG PO CAPS: 40.0000 mg | ORAL_CAPSULE | Freq: Every day | ORAL | 2 refills | Status: DC
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
## 2017-08-26 ENCOUNTER — Other Ambulatory Visit (HOSPITAL_COMMUNITY): Payer: Self-pay | Admitting: Psychiatry

## 2017-08-26 ENCOUNTER — Telehealth (HOSPITAL_COMMUNITY): Payer: Self-pay | Admitting: *Deleted

## 2017-08-26 MED ORDER — BUSPIRONE HCL 10 MG PO TABS: 10.0000 mg | ORAL_TABLET | Freq: Three times a day (TID) | ORAL | 2 refills | Status: DC

## 2017-08-26 NOTE — Telephone Encounter (Signed)
Spoke to patient-buspar 10 mg tid called in

## 2017-08-26 NOTE — Telephone Encounter (Signed)
Dr Tenny Craw patient has called in requesting something for her anxiety. She asked if you could please give her a call # [phone]

## 2017-09-06 ENCOUNTER — Telehealth (HOSPITAL_COMMUNITY): Payer: Self-pay | Admitting: *Deleted

## 2017-09-06 ENCOUNTER — Ambulatory Visit (HOSPITAL_COMMUNITY): Payer: Self-pay | Admitting: Psychiatry

## 2017-09-06 NOTE — Telephone Encounter (Signed)
left voice message, attempt to reschdule appointment 09/06/17 due to weather.

## 2017-09-08 ENCOUNTER — Ambulatory Visit (HOSPITAL_COMMUNITY): Payer: Self-pay | Admitting: Psychiatry

## 2017-09-12 ENCOUNTER — Telehealth (HOSPITAL_COMMUNITY): Payer: Self-pay | Admitting: *Deleted

## 2017-09-12 NOTE — Telephone Encounter (Signed)
LVM returning call concerning her anxiety medication  Not helping.

## 2017-09-29 ENCOUNTER — Ambulatory Visit (HOSPITAL_COMMUNITY): Payer: Self-pay | Admitting: Psychiatry

## 2017-10-06 ENCOUNTER — Encounter (HOSPITAL_COMMUNITY): Payer: Self-pay | Admitting: Psychiatry

## 2017-10-06 ENCOUNTER — Ambulatory Visit (INDEPENDENT_AMBULATORY_CARE_PROVIDER_SITE_OTHER): Payer: Medicare HMO | Admitting: Psychiatry

## 2017-10-06 VITALS — BP 143/61 | HR 82 | Ht 64.0 in | Wt 311.0 lb

## 2017-10-06 DIAGNOSIS — Z560 Unemployment, unspecified: Secondary | ICD-10-CM | POA: Diagnosis not present

## 2017-10-06 DIAGNOSIS — Z818 Family history of other mental and behavioral disorders: Secondary | ICD-10-CM

## 2017-10-06 DIAGNOSIS — Z736 Limitation of activities due to disability: Secondary | ICD-10-CM

## 2017-10-06 DIAGNOSIS — F419 Anxiety disorder, unspecified: Secondary | ICD-10-CM

## 2017-10-06 DIAGNOSIS — F3130 Bipolar disorder, current episode depressed, mild or moderate severity, unspecified: Secondary | ICD-10-CM

## 2017-10-06 DIAGNOSIS — R450 Nervousness: Secondary | ICD-10-CM | POA: Diagnosis not present

## 2017-10-06 DIAGNOSIS — Z811 Family history of alcohol abuse and dependence: Secondary | ICD-10-CM | POA: Diagnosis not present

## 2017-10-06 MED ORDER — LURASIDONE HCL 40 MG PO TABS
40.0000 mg | ORAL_TABLET | Freq: Every day | ORAL | 2 refills | Status: DC
Start: 2017-10-06 — End: 2017-11-23

## 2017-10-06 MED ORDER — ESCITALOPRAM OXALATE 20 MG PO TABS: 20.0000 mg | ORAL_TABLET | Freq: Two times a day (BID) | ORAL | 2 refills | Status: DC

## 2017-10-06 MED ORDER — BUSPIRONE HCL 15 MG PO TABS: 15.0000 mg | ORAL_TABLET | Freq: Three times a day (TID) | ORAL | 2 refills | Status: DC

## 2017-10-06 NOTE — Progress Notes (Signed)
BH MD/PA/NP OP Progress Note  10/06/2017 10:16 AM [name]  MRN:  [id number]  Chief Complaint:  Chief Complaint    Depression; Anxiety; Follow-up     HPI: This patient is a 57 year old widowed white female who lives alone in [place]. She has 2 grown daughters and one 26 year old grandson. She is on disability for both back pain and bipolar disorder.  The patient was referred by Dr. Lolly Mustache from our [facility] clinic. The patient would like to start coming here as it is too far for her to drive to the other clinic.  The patient states that she has had problems with mood since her mid 60s. She began losing her temper having severe outburst mood swings and lashing out at people. 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 She began being paranoid and accusing people of doing things behind her back or talking about her. She was hospitalized 4 times the last time being at the behavioral [facility] hospital in 2011. At that time she was very depressed hallucinating and hearing command hallucinations to cut herself or kill herself. She was Stabilized on a combination of lithium Wellbutrin and Risperdal .  She had seen Dr. Lolly Mustache for a while and had been transferred to me after it became too far to drive to the [facility] clinic.  The patient returns after 2 months.  Last time her daughter was with her and explained that she had been seen in the emergency room after she overused Xanax and Ambien.  These were discontinued.  I did give her Prozac because she did not feel like the Lexapro is working.  She tells me now that she had to stop the Prozac because it made her too jittery.  During the holidays she spent a lot of time volunteering with numerous ministries in her church, helping children putting together food baskets etc.  When she does that she feels much better and no longer feels depressed and useless.  I encouraged her to continue this.   Right now she is on no antidepressant and this worries me because of her severity of depression.  She has been on numerous medications in the past and probably did the best on Lexapro so will reinstate it.  She states she is still very anxious but I explained that I cannot prescribe any benzodiazepines because of her past history of misuse.  She is on BuSpar 10 mg 3 times daily and we can increase this.  She is actually sleeping fairly well without the Ambien. Visit Diagnosis:    ICD-10-CM   1. Bipolar I disorder, most recent episode depressed (HCC) F31.30     Past Psychiatric History: For previous hospitalizations for bipolar disorder, long-term outpatient treatment  Past Medical History:  Past Medical History:  Diagnosis Date  . Asthma   . Back pain   . Chest pain   . Diabetes mellitus type II   . Headache   . High cholesterol   . Hypertension   . Obesity, morbid (HCC)   . Other and unspecified bipolar disorders   . 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 Thyroid disease     Past Surgical History:  Procedure Laterality Date  . TUBAL LIGATION     Bilateral    Family Psychiatric History: See below  Family History:  Family History  Problem Relation Age of Onset  . Heart attack Maternal Uncle   . Depression Maternal Uncle   . Depression  Mother   . Alcohol abuse Maternal Grandfather   . Alcohol abuse Maternal Uncle   . Depression Maternal Grandmother     Social History:  Social History   Socioeconomic History  . Marital status: Married    Spouse name: None  . Number of children: None  . Years of education: None  . Highest education level: None  Social Needs  . Financial resource strain: None  . Food insecurity - worry: None  . Food insecurity - inability: None  . Transportation needs - medical: None  . Transportation needs - non-medical: None  Occupational History  . Occupation: Disabled    Employer: UNEMPLOYED  Tobacco Use  . Smoking status: Never Smoker  .  Smokeless tobacco: Never Used  Substance and Sexual Activity  . Alcohol use: No    Alcohol/week: 0.0 oz  . Drug use: No    Comment: 05/13/2016 per pt no  . Sexual activity: Yes    Birth control/protection: Surgical  Other Topics Concern  . None  Social History Narrative   Married   No regular exercise    Allergies:  Allergies  Allergen Reactions  . Doxycycline Nausea And Vomiting  . Levofloxacin Other (See Comments)    headache  . Propoxyphene N-Acetaminophen     Metabolic Disorder Labs: Lab Results  Component Value Date   HGBA1C 11.0 (H) 10/14/2014   MPG 269 (H) 10/14/2014   MPG 206 (H) 09/05/2010   No results found for: PROLACTIN Lab Results  Component Value Date   CHOL (H) 06/28/2009    388        ATP III CLASSIFICATION:  <200     mg/dL   Desirable  379-024  mg/dL   Borderline High  >=097    mg/dL   High          TRIG 353 (H) 06/28/2009   HDL 42 06/28/2009   CHOLHDL 9.2 06/28/2009   VLDL UNABLE TO CALCULATE IF TRIGLYCERIDE OVER 400 mg/dL 29/92/4268   LDLCALC  34/19/6222    UNABLE TO CALCULATE IF TRIGLYCERIDE OVER 400 mg/dL        Total Cholesterol/HDL:CHD Risk Coronary Heart Disease Risk Table                     Men   Women  1/2 Average Risk   3.4   3.3  Average Risk       5.0   4.4  2 X Average Risk   9.6   7.1  3 X Average Risk  23.4   11.0        Use the calculated Patient Ratio above and the CHD Risk Table to determine the patient's CHD Risk.        ATP III CLASSIFICATION (LDL):  <100     mg/dL   Optimal  979-892  mg/dL   Near or Above                    Optimal  130-159  mg/dL   Borderline  119-417  mg/dL   High  >408     mg/dL   Very High   Lab Results  Component Value Date   TSH 4.88 (H) 02/03/2016   TSH 4.395 10/14/2014    Therapeutic Level Labs: Lab Results  Component Value Date   LITHIUM 0.6 (L) 07/07/2016   LITHIUM 0.6 (L) 03/10/2016   No results found for: VALPROATE No components found for:  CBMZ  Current  Medications: Current  Outpatient Medications  Medication Sig Dispense Refill  . albuterol (PROVENTIL HFA;VENTOLIN HFA) 108 (90 BASE) MCG/ACT inhaler Inhale 2 puffs into the lungs every 6 (six) hours as needed for wheezing or shortness of breath.     Marland Kitchen amLODipine (NORVASC) 10 MG tablet Take 10 mg by mouth daily.     Marland Kitchen atorvastatin (LIPITOR) 80 MG tablet Take 80 mg by mouth every morning.     Marland Kitchen co-enzyme Q-10 30 MG capsule Take 60 mg by mouth every morning.    . fluticasone (FLONASE) 50 MCG/ACT nasal spray Place 1 spray into the nose daily as needed for allergies.     . hydrochlorothiazide (MICROZIDE) 12.5 MG capsule Take 12.5 mg by mouth daily.    . insulin NPH-regular Human (NOVOLIN 70/30) (70-30) 100 UNIT/ML injection Inject 300 Units into the skin daily with breakfast.     . levothyroxine (SYNTHROID, LEVOTHROID) 50 MCG tablet Take 50 mcg by mouth daily before breakfast.     . lurasidone (LATUDA) 40 MG TABS tablet Take 1 tablet (40 mg total) by mouth daily after supper. 90 tablet 2  . metFORMIN (GLUCOPHAGE-XR) 750 MG 24 hr tablet Take 750 mg by mouth daily with breakfast.    . metoprolol succinate (TOPROL-XL) 50 MG 24 hr tablet Take 50 mg by mouth daily.     . montelukast (SINGULAIR) 10 MG tablet Take 10 mg by mouth at bedtime.    . Multiple Vitamin (MULTIVITAMIN WITH MINERALS) TABS tablet Take 1 tablet by mouth daily.    Marland Kitchen topiramate (TOPAMAX) 50 MG tablet Take 50 mg by mouth 3 (three) times daily.     . busPIRone (BUSPAR) 15 MG tablet Take 1 tablet (15 mg total) by mouth 3 (three) times daily. 270 tablet 2  . escitalopram (LEXAPRO) 20 MG tablet Take 1 tablet (20 mg total) by mouth 2 (two) times daily. 180 tablet 2   No current facility-administered medications for this visit.      Musculoskeletal: Strength & Muscle Tone: within normal limits Gait & Station: normal Patient leans: N/A  Psychiatric Specialty Exam: Review of Systems  Psychiatric/Behavioral: The patient is  nervous/anxious.   All other systems reviewed and are negative.   Blood pressure (!) 143/61, pulse 82, height 5\' 4"  (1.626 m), weight (!) 311 lb (141.1 kg), SpO2 99 %.Body mass index is 53.38 kg/m.  General Appearance: Casual and Disheveled  Eye Contact:  Good  Speech:  Clear and Coherent  Volume:  Normal  Mood:  Anxious  Affect:  Appropriate and Congruent  Thought Process:  Goal Directed  Orientation:  Full (Time, Place, and Person)  Thought Content: Rumination   Suicidal Thoughts:  No  Homicidal Thoughts:  No  Memory:  Immediate;   Fair Recent;   Fair Remote;   Fair  Judgement:  Fair  Insight:  Lacking  Psychomotor Activity:  Decreased  Concentration:  Concentration: Fair and Attention Span: Fair  Recall:  Good  Fund of Knowledge: Good  Language: Good  Akathisia:  No  Handed:  Right  AIMS (if indicated): not done  Assets:  Communication Skills Desire for Improvement Resilience Social Support Talents/Skills  ADL's:  Intact  Cognition: WNL  Sleep:  Good   Screenings: MDI     Office Visit from 04/13/2016 in [facility]  Total Score (max 50)  25    PHQ2-9     Coun[facility]  PHQ-2 Total Score  4  PHQ-9 Total Score  15       Assessment and Plan: Patient is a 57 year old female with a history of bipolar disorder.  She is been through a serious bout of depression over the last year particularly since her husband and her dog both died.  She seems to be slowly coming out of this.  I do not think it is a good idea for her to be off antidepressants so she will be restarted on Lexapro 20 mg twice daily.  She will continue Latuda 40 mg daily for mood stabilization.  Due to anxiety she will increase BuSpar to 15 mg 3 times a day.  She will return to see me in 6 weeks or call sooner if needed   [name], MD 10/06/2017, 10:16 AM

## 2017-11-17 ENCOUNTER — Ambulatory Visit (HOSPITAL_COMMUNITY): Payer: Self-pay | Admitting: Psychiatry

## 2017-11-20 ENCOUNTER — Emergency Department (HOSPITAL_COMMUNITY)
Admission: EM | Admit: 2017-11-20 | Discharge: 2017-11-20 | Disposition: A | Discharge status: Home / Self Care | Payer: Medicare HMO | Attending: Emergency Medicine | Admitting: Emergency Medicine

## 2017-11-20 ENCOUNTER — Encounter (HOSPITAL_COMMUNITY): Payer: Self-pay | Admitting: Emergency Medicine

## 2017-11-20 ENCOUNTER — Other Ambulatory Visit: Payer: Self-pay

## 2017-11-20 DIAGNOSIS — Z794 Long term (current) use of insulin: Secondary | ICD-10-CM | POA: Diagnosis not present

## 2017-11-20 DIAGNOSIS — Z79899 Other long term (current) drug therapy: Secondary | ICD-10-CM | POA: Diagnosis not present

## 2017-11-20 DIAGNOSIS — N764 Abscess of vulva: Secondary | ICD-10-CM | POA: Diagnosis not present

## 2017-11-20 DIAGNOSIS — J45909 Unspecified asthma, uncomplicated: Secondary | ICD-10-CM | POA: Diagnosis not present

## 2017-11-20 DIAGNOSIS — N9089 Other specified noninflammatory disorders of vulva and perineum: Secondary | ICD-10-CM | POA: Diagnosis present

## 2017-11-20 DIAGNOSIS — R21 Rash and other nonspecific skin eruption: Secondary | ICD-10-CM | POA: Insufficient documentation

## 2017-11-20 DIAGNOSIS — E11628 Type 2 diabetes mellitus with other skin complications: Secondary | ICD-10-CM | POA: Insufficient documentation

## 2017-11-20 LAB — CBG MONITORING, ED: Glucose-Capillary: 472 mg/dL — ABNORMAL HIGH (ref 65–99)

## 2017-11-20 MED ORDER — LIDOCAINE-EPINEPHRINE 1 %-1:100000 IJ SOLN
10.0000 mL | Freq: Once | INTRAMUSCULAR | Status: AC
  Administered 2017-11-20: 10 mL
  Filled 2017-11-20: qty 10

## 2017-11-20 MED ORDER — LIDOCAINE HCL (PF) 1 % IJ SOLN
INTRAMUSCULAR | Status: AC
  Filled 2017-11-20: qty 4

## 2017-11-20 MED ORDER — SULFAMETHOXAZOLE-TRIMETHOPRIM 800-160 MG PO TABS: 1.0000 | ORAL_TABLET | Freq: Two times a day (BID) | ORAL | 0 refills | Status: AC

## 2017-11-20 MED ORDER — LIDOCAINE-EPINEPHRINE (PF) 1 %-1:200000 IJ SOLN
INTRAMUSCULAR | Status: AC
  Filled 2017-11-20: qty 30

## 2017-11-20 MED ORDER — IBUPROFEN 600 MG PO TABS: 600.0000 mg | ORAL_TABLET | Freq: Four times a day (QID) | ORAL | 0 refills | Status: DC | PRN

## 2017-11-20 NOTE — ED Provider Notes (Signed)
[facility] EMERGENCY DEPARTMENT Provider Note   CSN: [id number] Arrival date & time: 11/20/17  1740     History   Chief Complaint Chief Complaint  Patient presents with  . Abscess    HPI [name] is a 57 y.o. female.  HPI  57 year old female, she has a known diabetic, she has had recurrent infections subcutaneously in the past, this has recurred and over the last 5 days she has had progressive swelling tenderness and redness to her left labia majora.  She denies fevers, she has not been on recent antibiotics for the specific infection though recently she was on antibiotics for another subcutaneous infection.  The symptoms are progressive, gradually worsening, worse with palpation.  Has had some spontaneous bleeding today  Past Medical History:  Diagnosis Date  . Asthma   . Back pain   . Chest pain   . Diabetes mellitus type II   . Headache   . High cholesterol   . Hypertension   . Obesity, morbid (HCC)   . Other and unspecified bipolar disorders   . 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 Thyroid disease     Patient Active Problem List   Diagnosis Date Noted  . Bipolar disorder, unspecified (HCC) 02/28/2012  . Bipolar 1 disorder, depressed (HCC) 12/27/2011  . OBESITY, MORBID 07/07/2009  . OTHER AND UNSPECIFIED BIPOLAR DISORDERS 07/07/2009  . HYPERTENSION 07/07/2009  . ASTHMA 07/07/2009  . CHEST PAIN 07/07/2009    Past Surgical History:  Procedure Laterality Date  . TUBAL LIGATION     Bilateral    OB History    Gravida Para Term Preterm AB Living   2 2 2     2    SAB TAB Ectopic Multiple Live Births                   Home Medications    Prior to Admission medications   Medication Sig Start Date End Date Taking? Authorizing Provider  albuterol (PROVENTIL HFA;VENTOLIN HFA) 108 (90 BASE) MCG/ACT inhaler Inhale 2 puffs into the lungs every 6 (six) hours as needed for wheezing or shortness of breath.     [provider]  amLODipine (NORVASC) 10  MG tablet Take 10 mg by mouth daily.  09/04/13   [provider]  atorvastatin (LIPITOR) 80 MG tablet Take 80 mg by mouth every morning.  11/11/14   [provider]  busPIRone (BUSPAR) 15 MG tablet Take 1 tablet (15 mg total) by mouth 3 (three) times daily. 10/06/17   12/04/17, MD  co-enzyme Q-10 30 MG capsule Take 60 mg by mouth every morning.    [provider]  escitalopram (LEXAPRO) 20 MG tablet Take 1 tablet (20 mg total) by mouth 2 (two) times daily. 10/06/17   12/04/17, MD  fluticasone (FLONASE) 50 MCG/ACT nasal spray Place 1 spray into the nose daily as needed for allergies.  09/04/13   [provider]  hydrochlorothiazide (MICROZIDE) 12.5 MG capsule Take 12.5 mg by mouth daily. 07/08/17   [provider]  ibuprofen (ADVIL,MOTRIN) 600 MG tablet Take 1 tablet (600 mg total) by mouth every 6 (six) hours as needed. 11/20/17   11/22/17, MD  insulin NPH-regular Human (NOVOLIN 70/30) (70-30) 100 UNIT/ML injection Inject 300 Units into the skin daily with breakfast.     [provider]  levothyroxine (SYNTHROID, LEVOTHROID) 50 MCG tablet Take 50 mcg by mouth daily before breakfast.  09/02/13   [provider]  lurasidone (  LATUDA) 40 MG TABS tablet Take 1 tablet (40 mg total) by mouth daily after supper. 10/06/17   Myrlene Broker, MD  metFORMIN (GLUCOPHAGE-XR) 750 MG 24 hr tablet Take 750 mg by mouth daily with breakfast.    [provider]  metoprolol succinate (TOPROL-XL) 50 MG 24 hr tablet Take 50 mg by mouth daily.  11/30/13   [provider]  montelukast (SINGULAIR) 10 MG tablet Take 10 mg by mouth at bedtime.    [provider]  Multiple Vitamin (MULTIVITAMIN WITH MINERALS) TABS tablet Take 1 tablet by mouth daily.    [provider]  sulfamethoxazole-trimethoprim (BACTRIM DS,SEPTRA DS) 800-160 MG tablet Take 1 tablet by mouth 2 (two) times daily for 10 days. 11/20/17 11/30/17  Eber Hong, MD  topiramate (TOPAMAX) 50 MG tablet Take 50 mg by mouth 3 (three) times daily.  06/28/15   Beryle Beams, MD    Family History Family History  Problem Relation Age of Onset  . Heart attack Maternal Uncle   . Depression Maternal Uncle   . Depression Mother   . Alcohol abuse Maternal Grandfather   . Alcohol abuse Maternal Uncle   . Depression Maternal Grandmother     Social History Social History   Tobacco Use  . Smoking status: Never Smoker  . Smokeless tobacco: Never Used  Substance Use Topics  . Alcohol use: No    Alcohol/week: 0.0 oz  . Drug use: No    Comment: 05/13/2016 per pt no     Allergies   Amoxicillin; Doxycycline; Levofloxacin; and Propoxyphene n-acetaminophen   Review of Systems Review of Systems  Genitourinary: Positive for vaginal pain.  Skin: Positive for rash.  Hematological: Negative for adenopathy.     Physical Exam Updated Vital Signs BP (!) 170/105 (BP Location: Right Arm)   Pulse 78   Temp 98.7 F (37.1 C) (Oral)   Resp 18   Ht 5\' 4"  (1.626 m)   Wt (!) 140.6 kg (310 lb)   SpO2 97%   BMI 53.21 kg/m   Physical Exam  Constitutional: She appears well-developed and well-nourished. No distress.  HENT:  Head: Normocephalic and atraumatic.  Mouth/Throat: Oropharynx is clear and moist. No oropharyngeal exudate.  Eyes: Conjunctivae and EOM are normal. Pupils are equal, round, and reactive to light. Right eye exhibits no discharge. Left eye exhibits no discharge. No scleral icterus.  Neck: Normal range of motion. Neck supple. No JVD present. No thyromegaly present.  Cardiovascular: Normal rate, regular rhythm, normal heart sounds and intact distal pulses. Exam reveals no gallop and no friction rub.  No murmur heard. Pulmonary/Chest: Effort normal and breath sounds normal. No respiratory distress. She has no wheezes. She has no rales.  Abdominal: Soft. Bowel sounds are normal. She exhibits no distension and no mass. There is no  tenderness.  Genitourinary:  Genitourinary Comments: Chaperone present for exam, swollen left labia, mild induration, fluctuance  Musculoskeletal: Normal range of motion. She exhibits no edema or tenderness.  Lymphadenopathy:    She has no cervical adenopathy.  Neurological: She is alert. Coordination normal.  Skin: Skin is warm and dry. No rash noted. No erythema.  Psychiatric: She has a normal mood and affect. Her behavior is normal.  Nursing note and vitals reviewed.    ED Treatments / Results  Labs (all labs ordered are listed, but only abnormal results are displayed) Labs Reviewed  CBG MONITORING, ED - Abnormal; Notable for the following components:      Result Value  Glucose-Capillary 472 (*)    All other components within normal limits     Radiology No results found.  Procedures .Marland KitchenIncision and Drainage Date/Time: 11/20/2017 9:29 PM Performed by: Eber Hong, MD Authorized by: Eber Hong, MD   Consent:    Consent obtained:  Verbal   Consent given by:  Patient   Risks discussed:  Bleeding, damage to other organs, incomplete drainage, infection and pain   Alternatives discussed:  Delayed treatment Location:    Type:  Abscess   Size:  Moderate   Location:  Anogenital   Anogenital location:  Vulva Pre-procedure details:    Skin preparation:  Betadine Anesthesia (see MAR for exact dosages):    Anesthesia method:  Local infiltration   Local anesthetic:  Lidocaine 1% WITH epi Procedure type:    Complexity:  Complex Procedure details:    Incision types:  Single straight   Incision depth:  Submucosal   Scalpel blade:  11   Wound management:  Probed and deloculated, irrigated with saline and extensive cleaning   Drainage:  Purulent and bloody   Drainage amount:  Copious   Wound treatment:  Wound left open   Packing materials:  None Post-procedure details:    Patient tolerance of procedure:  Tolerated well, no immediate complications   (including critical  care time)  Medications Ordered in ED Medications  lidocaine-EPINEPHrine (XYLOCAINE W/EPI) 1 %-1:100000 (with pres) injection 10 mL (not administered)  lidocaine-EPINEPHrine (XYLOCAINE-EPINEPHrine) 1 %-1:200000 (PF) injection (not administered)     Initial Impression / Assessment and Plan / ED Course  I have reviewed the triage vital signs and the nursing notes.  Pertinent labs & imaging results that were available during my care of the patient were reviewed by me and considered in my medical decision making (see chart for details).    The patient has what appears to be a Bartholin's gland abscess, she will need incision and drainage.  Blood sugar is elevated but her vital signs appear unremarkable other than mild hypertension  Chaperone present for incision and drainage which went very successfully.  Patient states that she is not very good about maintaining a diabetic diet likely the cause of her hyperglycemia and thus her recurrent infections.  She was counseled on strict adherence to a diabetic diet.  Vitals:   11/20/17 1754 11/20/17 1757  BP:  (!) 170/105  Pulse:  78  Resp:  18  Temp:  98.7 F (37.1 C)  TempSrc:  Oral  SpO2:  97%  Weight: (!) 140.6 kg (310 lb)   Height: 5\' 4"  (1.626 m)      Final Clinical Impressions(s) / ED Diagnoses   Final diagnoses:  Abscess, vulva    ED Discharge Orders        Ordered    sulfamethoxazole-trimethoprim (BACTRIM DS,SEPTRA DS) 800-160 MG tablet  2 times daily     11/20/17 2131    ibuprofen (ADVIL,MOTRIN) 600 MG tablet  Every 6 hours PRN     11/20/17 2131       2132, MD 11/20/17 2132

## 2017-11-20 NOTE — Discharge Instructions (Signed)
You have been treated for a large abscess Please take Bactrim by mouth twice a day for the next 10 days Please take ibuprofen as needed for pain Please soak in a warm soapy tub twice a day for 20 minutes Seek medical attention for severe or worsening pain swelling redness or fevers Follow-up exam at your doctor's office within 1 week

## 2017-11-20 NOTE — ED Triage Notes (Signed)
Pt reports abscess to L crease of leg X5 days, denies drainage or fever.

## 2017-11-23 ENCOUNTER — Ambulatory Visit (INDEPENDENT_AMBULATORY_CARE_PROVIDER_SITE_OTHER): Payer: Medicare HMO | Admitting: Psychiatry

## 2017-11-23 ENCOUNTER — Telehealth (HOSPITAL_COMMUNITY): Payer: Self-pay | Admitting: *Deleted

## 2017-11-23 ENCOUNTER — Encounter (HOSPITAL_COMMUNITY): Payer: Self-pay | Admitting: Psychiatry

## 2017-11-23 ENCOUNTER — Other Ambulatory Visit (HOSPITAL_COMMUNITY): Payer: Self-pay | Admitting: Psychiatry

## 2017-11-23 VITALS — BP 169/78 | HR 70 | Ht 64.0 in | Wt 301.0 lb

## 2017-11-23 DIAGNOSIS — Z811 Family history of alcohol abuse and dependence: Secondary | ICD-10-CM

## 2017-11-23 DIAGNOSIS — F3130 Bipolar disorder, current episode depressed, mild or moderate severity, unspecified: Secondary | ICD-10-CM | POA: Diagnosis not present

## 2017-11-23 DIAGNOSIS — Z560 Unemployment, unspecified: Secondary | ICD-10-CM | POA: Diagnosis not present

## 2017-11-23 DIAGNOSIS — M549 Dorsalgia, unspecified: Secondary | ICD-10-CM | POA: Diagnosis not present

## 2017-11-23 DIAGNOSIS — F419 Anxiety disorder, unspecified: Secondary | ICD-10-CM | POA: Diagnosis not present

## 2017-11-23 DIAGNOSIS — R450 Nervousness: Secondary | ICD-10-CM

## 2017-11-23 DIAGNOSIS — Z736 Limitation of activities due to disability: Secondary | ICD-10-CM | POA: Diagnosis not present

## 2017-11-23 DIAGNOSIS — Z818 Family history of other mental and behavioral disorders: Secondary | ICD-10-CM

## 2017-11-23 MED ORDER — BUSPIRONE HCL 30 MG PO TABS: 30.0000 mg | ORAL_TABLET | Freq: Three times a day (TID) | ORAL | 2 refills | Status: DC

## 2017-11-23 MED ORDER — LURASIDONE HCL 40 MG PO TABS: 40.0000 mg | ORAL_TABLET | Freq: Every day | ORAL | 2 refills | Status: DC

## 2017-11-23 MED ORDER — ESCITALOPRAM OXALATE 20 MG PO TABS: 20.0000 mg | ORAL_TABLET | Freq: Two times a day (BID) | ORAL | 2 refills | Status: DC

## 2017-11-23 NOTE — Telephone Encounter (Signed)
Dr Tenny Craw Patient called after her office visit & noticed that she forgot to mention to change the Rx to [facility] for refills . Stated that she gets them with no co pay using [employer]. Buspirone is the medication requested to be resent .

## 2017-11-23 NOTE — Progress Notes (Signed)
BH MD/PA/NP OP Progress Note  11/23/2017 10:29 AM [name]  MRN:  [id number]  Chief Complaint:  Chief Complaint    Depression; Anxiety; Follow-up     HPI: This patient is a 57 year oldwidowed white female who lives alonein [place]. She has 2 grown daughters and one 58 year old grandson. She is on disability for both back pain and bipolar disorder.  The patient was referred by Dr. Lolly Mustache from our [facility] clinic. The patient would like to start coming here as it is too far for her to drive to the other clinic.  The patient states that she has had problems with mood since her mid 57s. She began losing her temper having severe outburst mood swings and lashing out at people. 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 She began being paranoid and accusing people of doing things behind her back or talking about her. She was hospitalized 4 times the last time being at the behavioral [facility] in 2011. At that time she was very depressed hallucinating and hearing command hallucinations to cut herself or kill herself. She was Stabilized on a combination of lithium Wellbutrin and Risperdal .  She had seen Dr. Lolly Mustache for a while and had been transferred to me after it became too far to drive to the [place] clinic.  Patient returns after 2 months.  She is actually doing fairly well.  She is staying active in her church.  She is talking positively about joining the senior center.  Her mood seems to be better and she is now taking the Lexapro regularly.  Kasandra Knudsen seems to be helping with her mood stabilization.  She does not always sleep well but I am reluctant to get her on any sleeping pills because she tends to overuse them.  I suggested she try melatonin and she states this has worked in the past.  She still has a fair amount of anxiety and I explained that we can still go up somewhat on the BuSpar.  We need to avoid benzodiazepines  because again she overuse them Visit Diagnosis:    ICD-10-CM   1. Bipolar I disorder, most recent episode depressed (HCC) F31.30     Past Psychiatric History: Four previous hospitalizations for bipolar disorder, long-term outpatient treatment  Past Medical History:  Past Medical History:  Diagnosis Date  . Asthma   . Back pain   . Chest pain   . Diabetes mellitus type II   . Headache   . High cholesterol   . Hypertension   . Obesity, morbid (HCC)   . Other and unspecified bipolar disorders   . 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 Thyroid disease     Past Surgical History:  Procedure Laterality Date  . TUBAL LIGATION     Bilateral    Family Psychiatric History: See below  Family History:  Family History  Problem Relation Age of Onset  . Heart attack Maternal Uncle   . Depression Maternal Uncle   . Depression Mother   . Alcohol abuse Maternal Grandfather   . Alcohol abuse Maternal Uncle   . Depression Maternal Grandmother     Social History:  Social History   Socioeconomic History  . Marital status: Married    Spouse name: None  . Number of children: None  . Years of education: None  . Highest education level: None  Social Needs  . Financial resource strain: None  . Food insecurity - worry: None  .  Food insecurity - inability: None  . Transportation needs - medical: None  . Transportation needs - non-medical: None  Occupational History  . Occupation: Disabled    Employer: UNEMPLOYED  Tobacco Use  . Smoking status: Never Smoker  . Smokeless tobacco: Never Used  Substance and Sexual Activity  . Alcohol use: No    Alcohol/week: 0.0 oz  . Drug use: No    Comment: 05/13/2016 per pt no  . Sexual activity: Yes    Birth control/protection: Surgical  Other Topics Concern  . None  Social History Narrative   Married   No regular exercise    Allergies:  Allergies  Allergen Reactions  . Amoxicillin Other (See Comments)    Oral swelling   . Doxycycline  Nausea And Vomiting  . Levofloxacin Other (See Comments)    headache  . Propoxyphene N-Acetaminophen     Metabolic Disorder Labs: Lab Results  Component Value Date   HGBA1C 11.0 (H) 10/14/2014   MPG 269 (H) 10/14/2014   MPG 206 (H) 09/05/2010   No results found for: PROLACTIN Lab Results  Component Value Date   CHOL (H) 06/28/2009    388        ATP III CLASSIFICATION:  <200     mg/dL   Desirable  335-456  mg/dL   Borderline High  >=256    mg/dL   High          TRIG 389 (H) 06/28/2009   HDL 42 06/28/2009   CHOLHDL 9.2 06/28/2009   VLDL UNABLE TO CALCULATE IF TRIGLYCERIDE OVER 400 mg/dL 37/34/2876   LDLCALC  81/15/7262    UNABLE TO CALCULATE IF TRIGLYCERIDE OVER 400 mg/dL        Total Cholesterol/HDL:CHD Risk Coronary Heart Disease Risk Table                     Men   Women  1/2 Average Risk   3.4   3.3  Average Risk       5.0   4.4  2 X Average Risk   9.6   7.1  3 X Average Risk  23.4   11.0        Use the calculated Patient Ratio above and the CHD Risk Table to determine the patient's CHD Risk.        ATP III CLASSIFICATION (LDL):  <100     mg/dL   Optimal  035-597  mg/dL   Near or Above                    Optimal  130-159  mg/dL   Borderline  416-384  mg/dL   High  >536     mg/dL   Very High   Lab Results  Component Value Date   TSH 4.88 (H) 02/03/2016   TSH 4.395 10/14/2014    Therapeutic Level Labs: Lab Results  Component Value Date   LITHIUM 0.6 (L) 07/07/2016   LITHIUM 0.6 (L) 03/10/2016   No results found for: VALPROATE No components found for:  CBMZ  Current Medications: Current Outpatient Medications  Medication Sig Dispense Refill  . albuterol (PROVENTIL HFA;VENTOLIN HFA) 108 (90 BASE) MCG/ACT inhaler Inhale 2 puffs into the lungs every 6 (six) hours as needed for wheezing or shortness of breath.     Marland Kitchen amLODipine (NORVASC) 10 MG tablet Take 10 mg by mouth daily.     Marland Kitchen atorvastatin (LIPITOR) 80 MG tablet Take 80 mg by mouth every morning.      Marland Kitchen  co-enzyme Q-10 30 MG capsule Take 60 mg by mouth every morning.    . escitalopram (LEXAPRO) 20 MG tablet Take 1 tablet (20 mg total) by mouth 2 (two) times daily. 180 tablet 2  . fluticasone (FLONASE) 50 MCG/ACT nasal spray Place 1 spray into the nose daily as needed for allergies.     . hydrochlorothiazide (MICROZIDE) 12.5 MG capsule Take 12.5 mg by mouth daily.    Marland Kitchen ibuprofen (ADVIL,MOTRIN) 600 MG tablet Take 1 tablet (600 mg total) by mouth every 6 (six) hours as needed. 30 tablet 0  . insulin NPH-regular Human (NOVOLIN 70/30) (70-30) 100 UNIT/ML injection Inject 300 Units into the skin daily with breakfast.     . levothyroxine (SYNTHROID, LEVOTHROID) 50 MCG tablet Take 50 mcg by mouth daily before breakfast.     . lurasidone (LATUDA) 40 MG TABS tablet Take 1 tablet (40 mg total) by mouth daily after supper. 90 tablet 2  . metFORMIN (GLUCOPHAGE-XR) 750 MG 24 hr tablet Take 750 mg by mouth daily with breakfast.    . metoprolol succinate (TOPROL-XL) 50 MG 24 hr tablet Take 50 mg by mouth daily.     . montelukast (SINGULAIR) 10 MG tablet Take 10 mg by mouth at bedtime.    . Multiple Vitamin (MULTIVITAMIN WITH MINERALS) TABS tablet Take 1 tablet by mouth daily.    Marland Kitchen sulfamethoxazole-trimethoprim (BACTRIM DS,SEPTRA DS) 800-160 MG tablet Take 1 tablet by mouth 2 (two) times daily for 10 days. 20 tablet 0  . topiramate (TOPAMAX) 50 MG tablet Take 50 mg by mouth 3 (three) times daily.     . busPIRone (BUSPAR) 30 MG tablet Take 1 tablet (30 mg total) by mouth 3 (three) times daily. 90 tablet 2   No current facility-administered medications for this visit.      Musculoskeletal: Strength & Muscle Tone: within normal limits Gait & Station: normal Patient leans: N/A  Psychiatric Specialty Exam: Review of Systems  Musculoskeletal: Positive for back pain.  Psychiatric/Behavioral: The patient is nervous/anxious.   All other systems reviewed and are negative.   Blood pressure (!) 169/78, pulse  70, height 5\' 4"  (1.626 m), weight (!) 301 lb (136.5 kg), SpO2 98 %.Body mass index is 51.67 kg/m.  General Appearance: Casual and Fairly Groomed  Eye Contact:  Good  Speech:  Clear and Coherent  Volume:  Normal  Mood:  Anxious  Affect:  Congruent  Thought Process:  Goal Directed  Orientation:  Full (Time, Place, and Person)  Thought Content: Rumination   Suicidal Thoughts:  No  Homicidal Thoughts:  No  Memory:  Immediate;   Good Recent;   Good Remote;   Good  Judgement:  Fair  Insight:  Fair  Psychomotor Activity:  Decreased  Concentration:  Concentration: Good and Attention Span: Good  Recall:  Good  Fund of Knowledge: Good  Language: Good  Akathisia:  No  Handed:  Right  AIMS (if indicated): not done  Assets:  Communication Skills Desire for Improvement Resilience Social Support Talents/Skills  ADL's:  Intact  Cognition: WNL  Sleep:  Fair   Screenings: MDI     Office Visit from 04/13/2016 in [facility]  Total Score (max 50)  25    PHQ2-9     [facility]  PHQ-2 Total Score  4  PHQ-9 Total Score  15       Assessment and Plan: This patient is a 57 year old female with a history of  bipolar disorder, primarily depressed recently.  Her symptoms were greatly exacerbated when her husband died 2 years ago.  She is starting to come out of her shell and is doing a little bit better now.  She will continue Lexapro 20 mg twice a day for depression, and Latuda 40 mg daily with supper for mood stabilization.  We will increase BuSpar to 30 mg 3 times a day for anxiety.  She has been instructed to return to melatonin for help with sleep.  Return to see me in 2 months   [name], MD 11/23/2017, 10:29 AM

## 2017-11-23 NOTE — Telephone Encounter (Signed)
sent 

## 2018-01-11 ENCOUNTER — Telehealth (HOSPITAL_COMMUNITY): Payer: Self-pay | Admitting: *Deleted

## 2018-01-11 NOTE — Telephone Encounter (Signed)
Dr Tenny Craw Patient called in stating that her next appointment is 01/24/18. And that she will run out of the   sample Latuda on 01/19/18. She requested samples if available

## 2018-01-11 NOTE — Telephone Encounter (Signed)
ok 

## 2018-01-24 ENCOUNTER — Encounter (HOSPITAL_COMMUNITY): Payer: Self-pay | Admitting: Psychiatry

## 2018-01-24 ENCOUNTER — Ambulatory Visit (INDEPENDENT_AMBULATORY_CARE_PROVIDER_SITE_OTHER): Payer: Medicare HMO | Admitting: Psychiatry

## 2018-01-24 VITALS — BP 130/94 | HR 62 | Ht 64.0 in | Wt 296.0 lb

## 2018-01-24 DIAGNOSIS — Z811 Family history of alcohol abuse and dependence: Secondary | ICD-10-CM | POA: Diagnosis not present

## 2018-01-24 DIAGNOSIS — F3130 Bipolar disorder, current episode depressed, mild or moderate severity, unspecified: Secondary | ICD-10-CM

## 2018-01-24 DIAGNOSIS — Z560 Unemployment, unspecified: Secondary | ICD-10-CM | POA: Diagnosis not present

## 2018-01-24 DIAGNOSIS — Z736 Limitation of activities due to disability: Secondary | ICD-10-CM

## 2018-01-24 DIAGNOSIS — M549 Dorsalgia, unspecified: Secondary | ICD-10-CM | POA: Diagnosis not present

## 2018-01-24 DIAGNOSIS — Z818 Family history of other mental and behavioral disorders: Secondary | ICD-10-CM | POA: Diagnosis not present

## 2018-01-24 MED ORDER — ESCITALOPRAM OXALATE 20 MG PO TABS: 20.0000 mg | ORAL_TABLET | Freq: Two times a day (BID) | ORAL | 2 refills | Status: DC

## 2018-01-24 MED ORDER — BUSPIRONE HCL 30 MG PO TABS: 30.0000 mg | ORAL_TABLET | Freq: Three times a day (TID) | ORAL | 2 refills | Status: DC

## 2018-01-24 NOTE — Progress Notes (Signed)
BH MD/PA/NP OP Progress Note  01/24/2018 11:03 AM [name]  MRN:  [id number]  Chief Complaint:  Chief Complaint    Depression; Anxiety; Manic Behavior; Follow-up     HPI: This patient is a 57 year oldwidowed white female who lives alonein [place]. She has 2 grown daughters and one 58 year old grandson. She is on disability for both back pain and bipolar disorder.  The patient was referred by Dr. Lolly Mustache from our [facility] clinic. The patient would like to start coming here as it is too far for her to drive to the other clinic.  The patient states that she has had problems with mood since her mid 33s. She began losing her temper having severe outburst mood swings and lashing out at people. 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 She began being paranoid and accusing people of doing things behind her back or talking about her. She was hospitalized 4 times the last time being at the behavioral health hospital in 2011. At that time she was very depressed hallucinating and hearing command hallucinations to cut herself or kill herself. She was Stabilized on a combination of lithium Wellbutrin and Risperdal.She had seen Dr. Lolly Mustache for a while and had been transferred to me after it became too far to drive to the [place] clinic.  The patient returns after 2 months.  She states that she is actually doing fairly well.  April 8 mark the 2-year anniversary of her husband's death and she has had a little bit more trouble this month but in general she is doing well and trying to stay busy.  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  She is seeing her grandson several times a week.  She states that she is sleeping better with the help of melatonin.  The mail-order pharmacy did not send her BuSpar on time and she got a little bit shaky but now that she is getting back on it she is feeling better.  She denies being depressed  or suicidal or having any hallucinations Visit Diagnosis:    ICD-10-CM   1. Bipolar I disorder, most recent episode depressed (HCC) F31.30     Past Psychiatric History: For previous hospitalizations for bipolar disorder, long-term outpatient treatment  Past Medical History:  Past Medical History:  Diagnosis Date  . Asthma   . Back pain   . Chest pain   . Diabetes mellitus type II   . Headache   . High cholesterol   . Hypertension   . Obesity, morbid (HCC)   . Other and unspecified bipolar disorders   . 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 Thyroid disease     Past Surgical History:  Procedure Laterality Date  . TUBAL LIGATION     Bilateral    Family Psychiatric History: See below  Family History:  Family History  Problem Relation Age of Onset  . Heart attack Maternal Uncle   . Depression Maternal Uncle   . Depression Mother   . Alcohol abuse Maternal Grandfather   . Alcohol abuse Maternal Uncle   . Depression Maternal Grandmother     Social History:  Social History   Socioeconomic History  . Marital status: Married    Spouse name: Not on file  . Number of children: Not on file  . Years of education: Not on file  . Highest education level: Not on file  Occupational History  . Occupation: Disabled  Employer: UNEMPLOYED  Social Needs  . Financial resource strain: Not on file  . Food insecurity:    Worry: Not on file    Inability: Not on file  . Transportation needs:    Medical: Not on file    Non-medical: Not on file  Tobacco Use  . Smoking status: Never Smoker  . Smokeless tobacco: Never Used  Substance and Sexual Activity  . Alcohol use: No    Alcohol/week: 0.0 oz  . Drug use: No    Comment: 05/13/2016 per pt no  . Sexual activity: Yes    Birth control/protection: Surgical  Lifestyle  . Physical activity:    Days per week: Not on file    Minutes per session: Not on file  . Stress: Not on file  Relationships  . Social connections:    Talks on  phone: Not on file    Gets together: Not on file    Attends religious service: Not on file    Active member of club or organization: Not on file    Attends meetings of clubs or organizations: Not on file    Relationship status: Not on file  Other Topics Concern  . Not on file  Social History Narrative   Married   No regular exercise    Allergies:  Allergies  Allergen Reactions  . Amoxicillin Other (See Comments)    Oral swelling   . Doxycycline Nausea And Vomiting  . Levofloxacin Other (See Comments)    headache  . Propoxyphene N-Acetaminophen     Metabolic Disorder Labs: Lab Results  Component Value Date   HGBA1C 11.0 (H) 10/14/2014   MPG 269 (H) 10/14/2014   MPG 206 (H) 09/05/2010   No results found for: PROLACTIN Lab Results  Component Value Date   CHOL (H) 06/28/2009    388        ATP III CLASSIFICATION:  <200     mg/dL   Desirable  606-301  mg/dL   Borderline High  >=601    mg/dL   High          TRIG 093 (H) 06/28/2009   HDL 42 06/28/2009   CHOLHDL 9.2 06/28/2009   VLDL UNABLE TO CALCULATE IF TRIGLYCERIDE OVER 400 mg/dL 23/55/7322   LDLCALC  02/54/2706    UNABLE TO CALCULATE IF TRIGLYCERIDE OVER 400 mg/dL        Total Cholesterol/HDL:CHD Risk Coronary Heart Disease Risk Table                     Men   Women  1/2 Average Risk   3.4   3.3  Average Risk       5.0   4.4  2 X Average Risk   9.6   7.1  3 X Average Risk  23.4   11.0        Use the calculated Patient Ratio above and the CHD Risk Table to determine the patient's CHD Risk.        ATP III CLASSIFICATION (LDL):  <100     mg/dL   Optimal  237-628  mg/dL   Near or Above                    Optimal  130-159  mg/dL   Borderline  315-176  mg/dL   High  >160     mg/dL   Very High   Lab Results  Component Value Date   TSH 4.88 (H) 02/03/2016  TSH 4.395 10/14/2014    Therapeutic Level Labs: Lab Results  Component Value Date   LITHIUM 0.6 (L) 07/07/2016   LITHIUM 0.6 (L) 03/10/2016   No  results found for: VALPROATE No components found for:  CBMZ  Current Medications: Current Outpatient Medications  Medication Sig Dispense Refill  . albuterol (PROVENTIL HFA;VENTOLIN HFA) 108 (90 BASE) MCG/ACT inhaler Inhale 2 puffs into the lungs every 6 (six) hours as needed for wheezing or shortness of breath.     Marland Kitchen amLODipine (NORVASC) 10 MG tablet Take 10 mg by mouth daily.     Marland Kitchen atorvastatin (LIPITOR) 80 MG tablet Take 80 mg by mouth every morning.     . busPIRone (BUSPAR) 30 MG tablet Take 1 tablet (30 mg total) by mouth 3 (three) times daily. 270 tablet 2  . co-enzyme Q-10 30 MG capsule Take 60 mg by mouth every morning.    . escitalopram (LEXAPRO) 20 MG tablet Take 1 tablet (20 mg total) by mouth 2 (two) times daily. 180 tablet 2  . fluticasone (FLONASE) 50 MCG/ACT nasal spray Place 1 spray into the nose daily as needed for allergies.     . hydrochlorothiazide (MICROZIDE) 12.5 MG capsule Take 12.5 mg by mouth daily.    Marland Kitchen ibuprofen (ADVIL,MOTRIN) 600 MG tablet Take 1 tablet (600 mg total) by mouth every 6 (six) hours as needed. 30 tablet 0  . insulin NPH-regular Human (NOVOLIN 70/30) (70-30) 100 UNIT/ML injection Inject 300 Units into the skin daily with breakfast.     . levothyroxine (SYNTHROID, LEVOTHROID) 50 MCG tablet Take 50 mcg by mouth daily before breakfast.     . lurasidone (LATUDA) 40 MG TABS tablet Take 1 tablet (40 mg total) by mouth daily after supper. 90 tablet 2  . metFORMIN (GLUCOPHAGE-XR) 750 MG 24 hr tablet Take 750 mg by mouth daily with breakfast.    . metoprolol succinate (TOPROL-XL) 50 MG 24 hr tablet Take 50 mg by mouth daily.     . montelukast (SINGULAIR) 10 MG tablet Take 10 mg by mouth at bedtime.    . Multiple Vitamin (MULTIVITAMIN WITH MINERALS) TABS tablet Take 1 tablet by mouth daily.    Marland Kitchen topiramate (TOPAMAX) 50 MG tablet Take 50 mg by mouth 3 (three) times daily.      No current facility-administered medications for this visit.       Musculoskeletal: Strength & Muscle Tone: within normal limits Gait & Station: normal Patient leans: N/A  Psychiatric Specialty Exam: ROS  Blood pressure (!) 130/94, pulse 62, height 5\' 4"  (1.626 m), weight 296 lb (134.3 kg), SpO2 98 %.Body mass index is 50.81 kg/m.  General Appearance: Casual and Fairly Groomed  Eye Contact:  Good  Speech:  Clear and Coherent  Volume:  Normal  Mood:  Anxious  Affect:  Appropriate and Congruent  Thought Process:  Goal Directed  Orientation:  Full (Time, Place, and Person)  Thought Content: WDL   Suicidal Thoughts:  No  Homicidal Thoughts:  No  Memory:  Immediate;   Good Recent;   Good Remote;   NA  Judgement:  Fair  Insight:  Fair  Psychomotor Activity:  Normal  Concentration:  Concentration: Good and Attention Span: Good  Recall:  Good  Fund of Knowledge: Good  Language: Good  Akathisia:  No  Handed:  Right  AIMS (if indicated): not done  Assets:  Communication Skills Desire for Improvement Resilience Social Support Talents/Skills  ADL's:  Intact  Cognition: WNL  Sleep:  Good  Screenings: MDI     Office Visit from 04/13/2016 in [facility]  Total Score (max 50)  25    PHQ2-9     [facility]  PHQ-2 Total Score  4  PHQ-9 Total Score  15       Assessment and Plan: This patient is a 57 year old female with a history of bipolar disorder primarily depressed.  Doing much better on her current medications.  She will continue new BuSpar 30 mg 3 times daily for anxiety, Lexapro 20 mg 2 times daily for depression and lurasidone 40 mg daily for bipolar depression.  She will return to see me in 2 months   [name], MD 01/24/2018, 11:03 AM

## 2018-02-01 ENCOUNTER — Telehealth (HOSPITAL_COMMUNITY): Payer: Self-pay | Admitting: *Deleted

## 2018-02-01 ENCOUNTER — Other Ambulatory Visit (HOSPITAL_COMMUNITY): Payer: Self-pay | Admitting: Psychiatry

## 2018-02-01 MED ORDER — HYDROXYZINE HCL 25 MG PO TABS: 25.0000 mg | ORAL_TABLET | Freq: Three times a day (TID) | ORAL | 2 refills | Status: DC | PRN

## 2018-02-01 NOTE — Telephone Encounter (Signed)
Dr Tenny [name] Patient called asking if you would consider increasing her Buspirone? She says she's been   very anxious

## 2018-02-01 NOTE — Telephone Encounter (Signed)
We can't go any higher on buspar. I sent in hydroxyzine to [facility] to use in addition

## 2018-03-27 ENCOUNTER — Ambulatory Visit (HOSPITAL_COMMUNITY): Payer: Medicare HMO | Admitting: Psychiatry

## 2018-04-12 ENCOUNTER — Ambulatory Visit (INDEPENDENT_AMBULATORY_CARE_PROVIDER_SITE_OTHER): Payer: Medicare HMO | Admitting: Psychiatry

## 2018-04-12 ENCOUNTER — Encounter (HOSPITAL_COMMUNITY): Payer: Self-pay | Admitting: Psychiatry

## 2018-04-12 VITALS — BP 171/97 | HR 71 | Resp 20 | Wt 308.2 lb

## 2018-04-12 DIAGNOSIS — G4700 Insomnia, unspecified: Secondary | ICD-10-CM

## 2018-04-12 DIAGNOSIS — F3130 Bipolar disorder, current episode depressed, mild or moderate severity, unspecified: Secondary | ICD-10-CM | POA: Diagnosis not present

## 2018-04-12 DIAGNOSIS — Z811 Family history of alcohol abuse and dependence: Secondary | ICD-10-CM

## 2018-04-12 DIAGNOSIS — R450 Nervousness: Secondary | ICD-10-CM | POA: Diagnosis not present

## 2018-04-12 DIAGNOSIS — Z818 Family history of other mental and behavioral disorders: Secondary | ICD-10-CM | POA: Diagnosis not present

## 2018-04-12 MED ORDER — ESCITALOPRAM OXALATE 20 MG PO TABS: 20.0000 mg | ORAL_TABLET | Freq: Two times a day (BID) | ORAL | 2 refills | Status: DC

## 2018-04-12 MED ORDER — LURASIDONE HCL 40 MG PO TABS: 40.0000 mg | ORAL_TABLET | Freq: Every day | ORAL | 2 refills | Status: DC

## 2018-04-12 MED ORDER — HYDROXYZINE HCL 25 MG PO TABS: ORAL_TABLET | ORAL | 2 refills | Status: DC

## 2018-04-12 MED ORDER — BUSPIRONE HCL 30 MG PO TABS: 30.0000 mg | ORAL_TABLET | Freq: Three times a day (TID) | ORAL | 2 refills | Status: DC

## 2018-04-12 NOTE — Progress Notes (Signed)
BH MD/PA/NP OP Progress Note  04/12/2018 11:08 AM [name]  MRN:  [id number]  Chief Complaint:  Chief Complaint    Depression; Anxiety; Follow-up     HPI: This patient is a 57 year oldwidowed white female who lives alonein [place]. She has 2 grown daughters and one 58 year old grandson. She is on disability for both back pain and bipolar disorder.  The patient was referred by Dr. Lolly Mustache from our [facility] clinic. The patient would like to start coming here as it is too far for her to drive to the other clinic.  The patient states that she has had problems with mood since her mid 5s. She began losing her temper having severe outburst mood swings and lashing out at people. 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 She began being paranoid and accusing people of doing things behind her back or talking about her. She was hospitalized 4 times the last time being at [facility] in 2011. At that time she was very depressed hallucinating and hearing command hallucinations to cut herself or kill herself. She was Stabilized on a combination of lithium Wellbutrin and Risperdal.She had seen Dr. Lolly Mustache for a while and had been transferred to me after it became too far to drive to the [place] clinic.  The patient returns after 2-1/2 months.  Overall she is doing well.  Her diabetic medication was recently changed from metformin to glipizide.  Metformin was causing severe diarrhea.  For a while her sugars were pretty high but they are starting to come down.  She is staying very busy and active with her church activities this summer.  She states that her mood is good and she denies any suicidal ideation or hallucinations.  She is still anxious but when she is on benzodiazepine she tends to overuse them and her daughters get very worried and upset.  I explained that we cannot prescribe these for her anymore.  She is not  sleeping well and I did offer to increase her hydroxyzine so she could take 2 at bedtime and she is okay with this. Visit Diagnosis:    ICD-10-CM   1. Bipolar I disorder, most recent episode depressed (HCC) F31.30     Past Psychiatric History: Four previous hospitalizations for bipolar disorder, long-term outpatient treatment  Past Medical History:  Past Medical History:  Diagnosis Date  . Asthma   . Back pain   . Chest pain   . Diabetes mellitus type II   . Headache   . High cholesterol   . Hypertension   . Obesity, morbid (HCC)   . Other and unspecified bipolar disorders   . 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 Thyroid disease     Past Surgical History:  Procedure Laterality Date  . TUBAL LIGATION     Bilateral    Family Psychiatric History: See below  Family History:  Family History  Problem Relation Age of Onset  . Heart attack Maternal Uncle   . Depression Maternal Uncle   . Depression Mother   . Alcohol abuse Maternal Grandfather   . Alcohol abuse Maternal Uncle   . Depression Maternal Grandmother     Social History:  Social History   Socioeconomic History  . Marital status: Married    Spouse name: Not on file  . Number of children: Not on file  . Years of education: Not on file  . Highest education level: Not on file  Occupational History  . Occupation: Disabled    Associate Professor: UNEMPLOYED  Social Needs  . Financial resource strain: Not on file  . Food insecurity:    Worry: Not on file    Inability: Not on file  . Transportation needs:    Medical: Not on file    Non-medical: Not on file  Tobacco Use  . Smoking status: Never Smoker  . Smokeless tobacco: Never Used  Substance and Sexual Activity  . Alcohol use: No    Alcohol/week: 0.0 oz  . Drug use: No    Comment: 05/13/2016 per pt no  . Sexual activity: Yes    Birth control/protection: Surgical  Lifestyle  . Physical activity:    Days per week: Not on file    Minutes per session: Not on file  .  Stress: Not on file  Relationships  . Social connections:    Talks on phone: Not on file    Gets together: Not on file    Attends religious service: Not on file    Active member of club or organization: Not on file    Attends meetings of clubs or organizations: Not on file    Relationship status: Not on file  Other Topics Concern  . Not on file  Social History Narrative   Married   No regular exercise    Allergies:  Allergies  Allergen Reactions  . Amoxicillin Other (See Comments)    Oral swelling   . Doxycycline Nausea And Vomiting  . Levofloxacin Other (See Comments)    headache  . Propoxyphene N-Acetaminophen     Metabolic Disorder Labs: Lab Results  Component Value Date   HGBA1C 11.0 (H) 10/14/2014   MPG 269 (H) 10/14/2014   MPG 206 (H) 09/05/2010   No results found for: PROLACTIN Lab Results  Component Value Date   CHOL (H) 06/28/2009    388        ATP III CLASSIFICATION:  <200     mg/dL   Desirable  950-932  mg/dL   Borderline High  >=671    mg/dL   High          TRIG 245 (H) 06/28/2009   HDL 42 06/28/2009   CHOLHDL 9.2 06/28/2009   VLDL UNABLE TO CALCULATE IF TRIGLYCERIDE OVER 400 mg/dL 80/99/8338   LDLCALC  25/01/3975    UNABLE TO CALCULATE IF TRIGLYCERIDE OVER 400 mg/dL        Total Cholesterol/HDL:CHD Risk Coronary Heart Disease Risk Table                     Men   Women  1/2 Average Risk   3.4   3.3  Average Risk       5.0   4.4  2 X Average Risk   9.6   7.1  3 X Average Risk  23.4   11.0        Use the calculated Patient Ratio above and the CHD Risk Table to determine the patient's CHD Risk.        ATP III CLASSIFICATION (LDL):  <100     mg/dL   Optimal  734-193  mg/dL   Near or Above                    Optimal  130-159  mg/dL   Borderline  790-240  mg/dL   High  >973     mg/dL   Very High   Lab Results  Component Value  Date   TSH 4.88 (H) 02/03/2016   TSH 4.395 10/14/2014    Therapeutic Level Labs: Lab Results  Component  Value Date   LITHIUM 0.6 (L) 07/07/2016   LITHIUM 0.6 (L) 03/10/2016   No results found for: VALPROATE No components found for:  CBMZ  Current Medications: Current Outpatient Medications  Medication Sig Dispense Refill  . glipiZIDE (GLUCOTROL) 5 MG tablet TAKE 1 TABLET(5 MG) BY MOUTH TWICE DAILY BEFORE MEALS    . albuterol (PROVENTIL HFA;VENTOLIN HFA) 108 (90 BASE) MCG/ACT inhaler Inhale 2 puffs into the lungs every 6 (six) hours as needed for wheezing or shortness of breath.     Marland Kitchen amLODipine (NORVASC) 10 MG tablet Take 10 mg by mouth daily.     Marland Kitchen atorvastatin (LIPITOR) 80 MG tablet Take 80 mg by mouth every morning.     . busPIRone (BUSPAR) 30 MG tablet Take 1 tablet (30 mg total) by mouth 3 (three) times daily. 270 tablet 2  . co-enzyme Q-10 30 MG capsule Take 60 mg by mouth every morning.    . escitalopram (LEXAPRO) 20 MG tablet Take 1 tablet (20 mg total) by mouth 2 (two) times daily. 180 tablet 2  . fluticasone (FLONASE) 50 MCG/ACT nasal spray Place 1 spray into the nose daily as needed for allergies.     . hydrochlorothiazide (MICROZIDE) 12.5 MG capsule Take 12.5 mg by mouth daily.    . hydrOXYzine (ATARAX/VISTARIL) 25 MG tablet Take one three times a day for anxiety and two at bedtime for sleep 150 tablet 2  . ibuprofen (ADVIL,MOTRIN) 600 MG tablet Take 1 tablet (600 mg total) by mouth every 6 (six) hours as needed. 30 tablet 0  . insulin NPH-regular Human (NOVOLIN 70/30) (70-30) 100 UNIT/ML injection Inject 300 Units into the skin daily with breakfast.     . levothyroxine (SYNTHROID, LEVOTHROID) 50 MCG tablet Take 50 mcg by mouth daily before breakfast.     . lurasidone (LATUDA) 40 MG TABS tablet Take 1 tablet (40 mg total) by mouth daily after supper. 90 tablet 2  . metoprolol succinate (TOPROL-XL) 50 MG 24 hr tablet Take 50 mg by mouth daily.     . montelukast (SINGULAIR) 10 MG tablet Take 10 mg by mouth at bedtime.    . Multiple Vitamin (MULTIVITAMIN WITH MINERALS) TABS tablet  Take 1 tablet by mouth daily.    Marland Kitchen topiramate (TOPAMAX) 50 MG tablet Take 50 mg by mouth 3 (three) times daily.      No current facility-administered medications for this visit.      Musculoskeletal: Strength & Muscle Tone: within normal limits Gait & Station: normal Patient leans: N/A  Psychiatric Specialty Exam: Review of Systems  Psychiatric/Behavioral: The patient is nervous/anxious and has insomnia.   All other systems reviewed and are negative.   Blood pressure (!) 171/97, pulse 71, resp. rate 20, weight (!) 308 lb 3.2 oz (139.8 kg).Body mass index is 52.9 kg/m.  General Appearance: Casual and Fairly Groomed  Eye Contact:  Good  Speech:  Clear and Coherent  Volume:  Normal  Mood:  Anxious and Euthymic  Affect:  Congruent  Thought Process:  Goal Directed  Orientation:  Full (Time, Place, and Person)  Thought Content: WDL   Suicidal Thoughts:  No  Homicidal Thoughts:  No  Memory:  Immediate;   Good Recent;   Good Remote;   Good  Judgement:  Fair  Insight:  Fair  Psychomotor Activity:  Normal  Concentration:  Concentration: Good and Attention  Span: Good  Recall:  Good  Fund of Knowledge: Good  Language: Good  Akathisia:  No  Handed:  Right  AIMS (if indicated): not done  Assets:  Communication Skills Desire for Improvement Resilience Social Support Talents/Skills  ADL's:  Intact  Cognition: WNL  Sleep:  Poor   Screenings: MDI     [facility]  Total Score (max 50)  25    PHQ2-9     Counselor from 06/21/2016 in [facility]  PHQ-2 Total Score  4  PHQ-9 Total Score  15       Assessment and Plan: Is a 57 year old female with a history of bipolar disorder, primarily depressed.  She is doing generally well on her current regimen.  She still would like to go back on Xanax but I do not think this is safe for her nor do her daughters.  Therefore  she will continue hydroxyzine 25 mg 3 times a day and will add 50 mg at night to help with sleep.  She will also continue BuSpar 30 mg 3 times daily for anxiety.  She will continue Latuda 40 mg daily for mood stabilization Lexapro 20 mg twice daily for depression.  She will return to see me in 2 months   [name], MD 04/12/2018, 11:08 AM

## 2018-05-15 ENCOUNTER — Telehealth (HOSPITAL_COMMUNITY): Payer: Self-pay

## 2018-05-15 ENCOUNTER — Other Ambulatory Visit (HOSPITAL_COMMUNITY): Payer: Self-pay | Admitting: Psychiatry

## 2018-05-15 MED ORDER — HYDROXYZINE HCL 25 MG PO TABS: ORAL_TABLET | ORAL | 2 refills | Status: DC

## 2018-05-15 NOTE — Telephone Encounter (Signed)
done

## 2018-05-15 NOTE — Telephone Encounter (Signed)
Called patient but was unable to leave a voicemail

## 2018-05-15 NOTE — Telephone Encounter (Signed)
Patient called and said that [facility] pharmacy hasn't sent her Hydroxyzine 25mg  tabs, so the patient wants to know can this medication be sent to [facility] on [address].  Please review

## 2018-06-13 ENCOUNTER — Ambulatory Visit (INDEPENDENT_AMBULATORY_CARE_PROVIDER_SITE_OTHER): Payer: Medicare HMO | Admitting: Psychiatry

## 2018-06-13 ENCOUNTER — Encounter (HOSPITAL_COMMUNITY): Payer: Self-pay | Admitting: Psychiatry

## 2018-06-13 VITALS — BP 142/80 | HR 62 | Ht 64.0 in | Wt 299.0 lb

## 2018-06-13 DIAGNOSIS — Z560 Unemployment, unspecified: Secondary | ICD-10-CM

## 2018-06-13 DIAGNOSIS — F3130 Bipolar disorder, current episode depressed, mild or moderate severity, unspecified: Secondary | ICD-10-CM

## 2018-06-13 MED ORDER — HYDROXYZINE HCL 25 MG PO TABS: ORAL_TABLET | ORAL | 2 refills | Status: DC

## 2018-06-13 MED ORDER — ESCITALOPRAM OXALATE 20 MG PO TABS: 20.0000 mg | ORAL_TABLET | Freq: Two times a day (BID) | ORAL | 2 refills | Status: DC

## 2018-06-13 MED ORDER — BUSPIRONE HCL 30 MG PO TABS: 30.0000 mg | ORAL_TABLET | Freq: Three times a day (TID) | ORAL | 2 refills | Status: DC

## 2018-06-13 MED ORDER — LURASIDONE HCL 40 MG PO TABS: 40.0000 mg | ORAL_TABLET | Freq: Every day | ORAL | 2 refills | Status: DC

## 2018-06-13 NOTE — Progress Notes (Signed)
BH MD/PA/NP OP Progress Note  06/13/2018 10:58 AM [name]  MRN:  [id number]  Chief Complaint:  Chief Complaint    Depression; Anxiety; Follow-up     HPI: This patient is a 57 year oldwidowed white female who lives alonein [place]. She has 2 grown daughters and one 58 year old grandson. She is on disability for both back pain and bipolar disorder.  The patient was referred by Dr. Lolly Mustache from our [facility] clinic. The patient would like to start coming here as it is too far for her to drive to the other clinic.  The patient states that she has had problems with mood since her mid 56s. She began losing her temper having severe outburst mood swings and lashing out at people. 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 She began being paranoid and accusing people of doing things behind her back or talking about her. She was hospitalized 4 times the last time being at the behavioral [facility] hospital in 2011. At that time she was very depressed hallucinating and hearing command hallucinations to cut herself or kill herself. She was Stabilized on a combination of lithium Wellbutrin and Risperdal.She had seen Dr. Lolly Mustache for a while and had been transferred to me after it became too far to drive to the [place] clinic.  Patient returns after 2 months.  For the most part she has been doing well.  She has a lot of body aches from arthritis but she tries to keep going.  She is sleeping fairly well but sometimes worries because she hears her house creaking at night.  She is staying very active in [employer] and doing a lot of projects.  She goes to her daughter's house every day when her grandson come home from school and makes dinner.  Her energy seems to be somewhat better and she denies suicidal ideation. Visit Diagnosis:    ICD-10-CM   1. Bipolar I disorder, most recent episode depressed (HCC) F31.30     Past Psychiatric History:  4 previous hospitalizations for bipolar disorder, long-term outpatient treatment  Past Medical History:  Past Medical History:  Diagnosis Date  . Asthma   . Back pain   . Chest pain   . Diabetes mellitus type II   . Headache   . High cholesterol   . Hypertension   . Obesity, morbid (HCC)   . Other and unspecified bipolar disorders   . 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 Thyroid disease     Past Surgical History:  Procedure Laterality Date  . TUBAL LIGATION     Bilateral    Family Psychiatric History: See below  Family History:  Family History  Problem Relation Age of Onset  . Heart attack Maternal Uncle   . Depression Maternal Uncle   . Depression Mother   . Alcohol abuse Maternal Grandfather   . Alcohol abuse Maternal Uncle   . Depression Maternal Grandmother     Social History:  Social History   Socioeconomic History  . Marital status: Married    Spouse name: Not on file  . Number of children: Not on file  . Years of education: Not on file  . Highest education level: Not on file  Occupational History  . Occupation: Disabled    Associate Professor: UNEMPLOYED  Social Needs  . Financial resource strain: Not on file  . Food insecurity:    Worry: Not on file    Inability: Not on file  .  Transportation needs:    Medical: Not on file    Non-medical: Not on file  Tobacco Use  . Smoking status: Never Smoker  . Smokeless tobacco: Never Used  Substance and Sexual Activity  . Alcohol use: No    Alcohol/week: 0.0 standard drinks  . Drug use: No    Comment: 05/13/2016 per pt no  . Sexual activity: Yes    Birth control/protection: Surgical  Lifestyle  . Physical activity:    Days per week: Not on file    Minutes per session: Not on file  . Stress: Not on file  Relationships  . Social connections:    Talks on phone: Not on file    Gets together: Not on file    Attends religious service: Not on file    Active member of club or organization: Not on file    Attends  meetings of clubs or organizations: Not on file    Relationship status: Not on file  Other Topics Concern  . Not on file  Social History Narrative   Married   No regular exercise    Allergies:  Allergies  Allergen Reactions  . Amoxicillin Other (See Comments)    Oral swelling   . Doxycycline Nausea And Vomiting  . Levofloxacin Other (See Comments)    headache  . Propoxyphene N-Acetaminophen     Metabolic Disorder Labs: Lab Results  Component Value Date   HGBA1C 11.0 (H) 10/14/2014   MPG 269 (H) 10/14/2014   MPG 206 (H) 09/05/2010   No results found for: PROLACTIN Lab Results  Component Value Date   CHOL (H) 06/28/2009    388        ATP III CLASSIFICATION:  <200     mg/dL   Desirable  962-952  mg/dL   Borderline High  >=841    mg/dL   High          TRIG 324 (H) 06/28/2009   HDL 42 06/28/2009   CHOLHDL 9.2 06/28/2009   VLDL UNABLE TO CALCULATE IF TRIGLYCERIDE OVER 400 mg/dL 40/06/2724   LDLCALC  36/64/4034    UNABLE TO CALCULATE IF TRIGLYCERIDE OVER 400 mg/dL        Total Cholesterol/HDL:CHD Risk Coronary Heart Disease Risk Table                     Men   Women  1/2 Average Risk   3.4   3.3  Average Risk       5.0   4.4  2 X Average Risk   9.6   7.1  3 X Average Risk  23.4   11.0        Use the calculated Patient Ratio above and the CHD Risk Table to determine the patient's CHD Risk.        ATP III CLASSIFICATION (LDL):  <100     mg/dL   Optimal  742-595  mg/dL   Near or Above                    Optimal  130-159  mg/dL   Borderline  638-756  mg/dL   High  >433     mg/dL   Very High   Lab Results  Component Value Date   TSH 4.88 (H) 02/03/2016   TSH 4.395 10/14/2014    Therapeutic Level Labs: Lab Results  Component Value Date   LITHIUM 0.6 (L) 07/07/2016   LITHIUM 0.6 (L) 03/10/2016   No results found  for: VALPROATE No components found for:  CBMZ  Current Medications: Current Outpatient Medications  Medication Sig Dispense Refill  .  albuterol (PROVENTIL HFA;VENTOLIN HFA) 108 (90 BASE) MCG/ACT inhaler Inhale 2 puffs into the lungs every 6 (six) hours as needed for wheezing or shortness of breath.     Marland Kitchen amLODipine (NORVASC) 10 MG tablet Take 10 mg by mouth daily.     Marland Kitchen atorvastatin (LIPITOR) 80 MG tablet Take 80 mg by mouth every morning.     . busPIRone (BUSPAR) 30 MG tablet Take 1 tablet (30 mg total) by mouth 3 (three) times daily. 270 tablet 2  . co-enzyme Q-10 30 MG capsule Take 60 mg by mouth every morning.    . escitalopram (LEXAPRO) 20 MG tablet Take 1 tablet (20 mg total) by mouth 2 (two) times daily. 180 tablet 2  . fluticasone (FLONASE) 50 MCG/ACT nasal spray Place 1 spray into the nose daily as needed for allergies.     Marland Kitchen glipiZIDE (GLUCOTROL) 5 MG tablet TAKE 1 TABLET(5 MG) BY MOUTH TWICE DAILY BEFORE MEALS    . hydrochlorothiazide (MICROZIDE) 12.5 MG capsule Take 12.5 mg by mouth daily.    . hydrOXYzine (ATARAX/VISTARIL) 25 MG tablet Take one three times a day for anxiety and two at bedtime for sleep 150 tablet 2  . ibuprofen (ADVIL,MOTRIN) 600 MG tablet Take 1 tablet (600 mg total) by mouth every 6 (six) hours as needed. 30 tablet 0  . insulin NPH-regular Human (NOVOLIN 70/30) (70-30) 100 UNIT/ML injection Inject 300 Units into the skin daily with breakfast.     . levothyroxine (SYNTHROID, LEVOTHROID) 50 MCG tablet Take 50 mcg by mouth daily before breakfast.     . lurasidone (LATUDA) 40 MG TABS tablet Take 1 tablet (40 mg total) by mouth daily after supper. 90 tablet 2  . metoprolol succinate (TOPROL-XL) 50 MG 24 hr tablet Take 50 mg by mouth daily.     . montelukast (SINGULAIR) 10 MG tablet Take 10 mg by mouth at bedtime.    . Multiple Vitamin (MULTIVITAMIN WITH MINERALS) TABS tablet Take 1 tablet by mouth daily.    Marland Kitchen topiramate (TOPAMAX) 50 MG tablet Take 50 mg by mouth 3 (three) times daily.      No current facility-administered medications for this visit.      Musculoskeletal: Strength & Muscle Tone:  within normal limits Gait & Station: normal Patient leans: N/A  Psychiatric Specialty Exam: Review of Systems  Musculoskeletal: Positive for back pain, joint pain and myalgias.  All other systems reviewed and are negative.   Blood pressure (!) 142/80, pulse 62, height 5\' 4"  (1.626 m), weight 299 lb (135.6 kg), SpO2 98 %.Body mass index is 51.32 kg/m.  General Appearance: Casual and Disheveled  Eye Contact:  Fair  Speech:  Clear and Coherent  Volume:  Normal  Mood:  Euthymic  Affect:  Appropriate and Congruent  Thought Process:  Goal Directed  Orientation:  Full (Time, Place, and Person)  Thought Content: WDL   Suicidal Thoughts:  No  Homicidal Thoughts:  No  Memory:  Immediate;   Good Recent;   Good Remote;   Fair  Judgement:  Fair  Insight:  Fair  Psychomotor Activity:  Decreased  Concentration:  Concentration: Fair and Attention Span: Fair  Recall:  of Knowledge: Fair  Language: Good  Akathisia:  No  Handed:  Right  AIMS (if indicated): not done  Assets:  Communication Skills Desire for Improvement Resilience Social Support Talents/Skills  ADL's:  Intact  Cognition: WNL  Sleep:  Fair   Screenings: MDI     Office Visit from 04/13/2016 in [facility]  Total Score (max 50)  25    PHQ2-9     Counselor from [facility]/21/2016 [facility]  PHQ-2 Total Score  4  PHQ-9 Total Score  15       Assessment and Plan: This patient is a 57 year old female with a history of bipolar disorder, most recently depressed.  She seems to be doing well on her current regimen and is no longer having paranoid hallucinations.  She will continue Latuda 40 mg daily for mood stabilization and psychosis, Lexapro 20 mg 2 times daily for depression, BuSpar 30 mg 3 times daily for anxiety and hydroxyzine 25 mg 3 times daily for anxiety and 50 mg at bedtime for sleep.  She will return to see me in 2  months   [name], MD 06/13/2018, 10:58 AM

## 2018-06-26 ENCOUNTER — Ambulatory Visit (INDEPENDENT_AMBULATORY_CARE_PROVIDER_SITE_OTHER): Payer: Medicare HMO | Admitting: Psychiatry

## 2018-06-26 ENCOUNTER — Encounter (HOSPITAL_COMMUNITY): Payer: Self-pay | Admitting: Psychiatry

## 2018-06-26 ENCOUNTER — Telehealth (HOSPITAL_COMMUNITY): Payer: Self-pay | Admitting: *Deleted

## 2018-06-26 VITALS — BP 144/78 | HR 71 | Ht 64.0 in | Wt 298.0 lb

## 2018-06-26 DIAGNOSIS — F3130 Bipolar disorder, current episode depressed, mild or moderate severity, unspecified: Secondary | ICD-10-CM | POA: Diagnosis not present

## 2018-06-26 DIAGNOSIS — F419 Anxiety disorder, unspecified: Secondary | ICD-10-CM | POA: Diagnosis not present

## 2018-06-26 DIAGNOSIS — G4700 Insomnia, unspecified: Secondary | ICD-10-CM

## 2018-06-26 DIAGNOSIS — Z560 Unemployment, unspecified: Secondary | ICD-10-CM

## 2018-06-26 MED ORDER — HYDROXYZINE PAMOATE 100 MG PO CAPS: 100.0000 mg | ORAL_CAPSULE | Freq: Every day | ORAL | 2 refills | Status: DC

## 2018-06-26 NOTE — Telephone Encounter (Signed)
Dr Tenny Craw Patient called stating that she's been of the Lithium for awhile & wonders if you think maybe she needs to go back on that or have one of her other med's decreased? States that she is ill mood, very agiated & seeing demons ( knows that they aren't real & that they aren't there). Seeking help?

## 2018-06-26 NOTE — Progress Notes (Signed)
BH MD/PA/NP OP Progress Note  06/26/2018 3:17 PM [name]  MRN:  [id number]  Chief Complaint:  Chief Complaint    Depression; Anxiety; Manic Behavior; Hallucinations; Follow-up     HPI: This patient is a 69 year oldwidowed white female who lives alonein [place]. She has 2 grown daughters and one 58 year old grandson. She is on disability for both back pain and bipolar disorder.  The patient was referred by Dr. Lolly Mustache from our [facility] clinic. The patient would like to start coming here as it is too far for her to drive to the other clinic.  The patient states that she has had problems with mood since her mid 62s. She began losing her temper having severe outburst mood swings and lashing out at people. 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 She began being paranoid and accusing people of doing things behind her back or talking about her. She was hospitalized 4 times the last time [facility]. At that time she was very depressed hallucinating and hearing command hallucinations to cut herself or kill herself. She was Stabilized on a combination of lithium Wellbutrin and Risperdal.She had seen Dr. Lolly Mustache for a while and had been transferred to me after it became too far to drive to the [facility] clinic.  The patient returns after about 10 days since her last visit.  She states that she has not been doing well lately.  She has not been sleeping and is very frightened at night.  She states that her preacher talked about "spiritual warfare" between God and the devil.  She feels like the devil is trying to overtake her.  She sees visions of the devil at night and her TV screen but she knows it is just the light shining on it.  Sometimes she sees blood going down her walls.  She is afraid to sleep at night and has been sleeping some during the day.  She is very anxious.  However she is fairly  logical today and does not seem to be psychotic sounds like she has worked herself up into a state.  She denies any thoughts of self-harm or harm to others.  I suggested she increase her hydroxyzine to 100 mg at bedtime and also increase her lurasidone to 60 mg to help with hallucinations and anxiety. Visit Diagnosis:    ICD-10-CM   1. Bipolar I disorder, most recent episode depressed (HCC) F31.30     Past Psychiatric History: 4 previous hospitalizations for bipolar disorder, long-term outpatient treatment  Past Medical History:  Past Medical History:  Diagnosis Date  . Asthma   . Back pain   . Chest pain   . Diabetes mellitus type II   . Headache   . High cholesterol   . Hypertension   . Obesity, morbid (HCC)   . Other and unspecified bipolar disorders   . 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 Thyroid disease     Past Surgical History:  Procedure Laterality Date  . TUBAL LIGATION     Bilateral    Family Psychiatric History: See below  Family History:  Family History  Problem Relation Age of Onset  . Heart attack Maternal Uncle   . Depression Maternal Uncle   . Depression Mother   . Alcohol abuse Maternal Grandfather   . Alcohol abuse Maternal Uncle   . Depression Maternal Grandmother     Social History:  Social History  Socioeconomic History  . Marital status: Married    Spouse name: Not on file  . Number of children: Not on file  . Years of education: Not on file  . Highest education level: Not on file  Occupational History  . Occupation: Disabled    Associate Professor: UNEMPLOYED  Social Needs  . Financial resource strain: Not on file  . Food insecurity:    Worry: Not on file    Inability: Not on file  . Transportation needs:    Medical: Not on file    Non-medical: Not on file  Tobacco Use  . Smoking status: Never Smoker  . Smokeless tobacco: Never Used  Substance and Sexual Activity  . Alcohol use: No    Alcohol/week: 0.0 standard drinks  . Drug use: No     Comment: 05/13/2016 per pt no  . Sexual activity: Yes    Birth control/protection: Surgical  Lifestyle  . Physical activity:    Days per week: Not on file    Minutes per session: Not on file  . Stress: Not on file  Relationships  . Social connections:    Talks on phone: Not on file    Gets together: Not on file    Attends religious service: Not on file    Active member of club or organization: Not on file    Attends meetings of clubs or organizations: Not on file    Relationship status: Not on file  Other Topics Concern  . Not on file  Social History Narrative   Married   No regular exercise    Allergies:  Allergies  Allergen Reactions  . Amoxicillin Other (See Comments)    Oral swelling   . Doxycycline Nausea And Vomiting  . Levofloxacin Other (See Comments)    headache  . Propoxyphene N-Acetaminophen     Metabolic Disorder Labs: Lab Results  Component Value Date   HGBA1C 11.0 (H) 10/14/2014   MPG 269 (H) 10/14/2014   MPG 206 (H) 09/05/2010   No results found for: PROLACTIN Lab Results  Component Value Date   CHOL (H) 06/28/2009    388        ATP III CLASSIFICATION:  <200     mg/dL   Desirable  967-591  mg/dL   Borderline High  >=638    mg/dL   High          TRIG 466 (H) 06/28/2009   HDL 42 06/28/2009   CHOLHDL 9.2 06/28/2009   VLDL UNABLE TO CALCULATE IF TRIGLYCERIDE OVER 400 mg/dL 59/93/5701   LDLCALC  77/93/9030    UNABLE TO CALCULATE IF TRIGLYCERIDE OVER 400 mg/dL        Total Cholesterol/HDL:CHD Risk Coronary Heart Disease Risk Table                     Men   Women  1/2 Average Risk   3.4   3.3  Average Risk       5.0   4.4  2 X Average Risk   9.6   7.1  3 X Average Risk  23.4   11.0        Use the calculated Patient Ratio above and the CHD Risk Table to determine the patient's CHD Risk.        ATP III CLASSIFICATION (LDL):  <100     mg/dL   Optimal  092-330  mg/dL   Near or Above  Optimal  130-159  mg/dL   Borderline   696-295  mg/dL   High  >284     mg/dL   Very High   Lab Results  Component Value Date   TSH 4.88 (H) 02/03/2016   TSH 4.395 10/14/2014    Therapeutic Level Labs: Lab Results  Component Value Date   LITHIUM 0.6 (L) 07/07/2016   LITHIUM 0.6 (L) 03/10/2016   No results found for: VALPROATE No components found for:  CBMZ  Current Medications: Current Outpatient Medications  Medication Sig Dispense Refill  . albuterol (PROVENTIL HFA;VENTOLIN HFA) 108 (90 BASE) MCG/ACT inhaler Inhale 2 puffs into the lungs every 6 (six) hours as needed for wheezing or shortness of breath.     Marland Kitchen amLODipine (NORVASC) 10 MG tablet Take 10 mg by mouth daily.     Marland Kitchen atorvastatin (LIPITOR) 80 MG tablet Take 80 mg by mouth every morning.     . busPIRone (BUSPAR) 30 MG tablet Take 1 tablet (30 mg total) by mouth 3 (three) times daily. 270 tablet 2  . co-enzyme Q-10 30 MG capsule Take 60 mg by mouth every morning.    . escitalopram (LEXAPRO) 20 MG tablet Take 1 tablet (20 mg total) by mouth 2 (two) times daily. 180 tablet 2  . fluticasone (FLONASE) 50 MCG/ACT nasal spray Place 1 spray into the nose daily as needed for allergies.     Marland Kitchen glipiZIDE (GLUCOTROL) 5 MG tablet TAKE 1 TABLET(5 MG) BY MOUTH TWICE DAILY BEFORE MEALS    . hydrochlorothiazide (MICROZIDE) 12.5 MG capsule Take 12.5 mg by mouth daily.    . hydrOXYzine (ATARAX/VISTARIL) 25 MG tablet Take one three times a day for anxiety and two at bedtime for sleep 150 tablet 2  . ibuprofen (ADVIL,MOTRIN) 600 MG tablet Take 1 tablet (600 mg total) by mouth every 6 (six) hours as needed. 30 tablet 0  . insulin NPH-regular Human (NOVOLIN 70/30) (70-30) 100 UNIT/ML injection Inject 300 Units into the skin daily with breakfast.     . levothyroxine (SYNTHROID, LEVOTHROID) 50 MCG tablet Take 50 mcg by mouth daily before breakfast.     . lurasidone (LATUDA) 40 MG TABS tablet Take 1 tablet (40 mg total) by mouth daily after supper. 90 tablet 2  . metoprolol succinate  (TOPROL-XL) 50 MG 24 hr tablet Take 50 mg by mouth daily.     . montelukast (SINGULAIR) 10 MG tablet Take 10 mg by mouth at bedtime.    . Multiple Vitamin (MULTIVITAMIN WITH MINERALS) TABS tablet Take 1 tablet by mouth daily.    Marland Kitchen topiramate (TOPAMAX) 50 MG tablet Take 50 mg by mouth 3 (three) times daily.     . hydrOXYzine (VISTARIL) 100 MG capsule Take 1 capsule (100 mg total) by mouth at bedtime. 30 capsule 2   No current facility-administered medications for this visit.      Musculoskeletal: Strength & Muscle Tone: within normal limits Gait & Station: normal Patient leans: N/A  Psychiatric Specialty Exam: Review of Systems  Musculoskeletal: Positive for back pain.  Psychiatric/Behavioral: Positive for hallucinations. The patient is nervous/anxious and has insomnia.   All other systems reviewed and are negative.   Blood pressure (!) 144/78, pulse 71, height 5\' 4"  (1.626 m), weight 298 lb (135.2 kg), SpO2 99 %.Body mass index is 51.15 kg/m.  General Appearance: Casual and Fairly Groomed  Eye Contact:  Good  Speech:  Clear and Coherent  Volume:  Normal  Mood:  Anxious  Affect:  Appropriate and Congruent  Thought Process:  Descriptions of Associations: Intact  Orientation:  Full (Time, Place, and Person)  Thought Content: Hallucinations: Visual and Rumination   Suicidal Thoughts:  No  Homicidal Thoughts:  No  Memory:  Immediate;   Good Recent;   Good Remote;   Fair  Judgement:  Poor  Insight:  Lacking  Psychomotor Activity:  Restlessness  Concentration:  Concentration: Poor and Attention Span: Poor  Recall:  Good  Fund of Knowledge: Fair  Language: Good  Akathisia:  No  Handed:  Right  AIMS (if indicated): not done  Assets:  Communication Skills Desire for Improvement Resilience Social Support Talents/Skills  ADL's:  Intact  Cognition: WNL  Sleep:  Poor   Screenings: MDI     [facility]   Total Score (max 50)  25    PHQ2-9     Counselor from 06/21/2016 in [facility]  PHQ-2 Total Score  4  PHQ-9 Total Score  15       Assessment and Plan: This patient is a 57 year old female with a history of bipolar disorder.  Since hearing her preacher talk about the devil she has been obsessed with this and having presumed visual hallucinations about it.  I think this is part of her mental illness and not any sort of spiritual crisis.  She will increase hydroxyzine to 100 mg at bedtime.  She will continue the hydroxyzine 25 mg 3 times a day for anxiety.  She will also increase Latuda to 60 mg a dinner for hallucinations, continue BuSpar 30 mg 3 times daily for anxiety and Lexapro 20 mg twice daily for depression.  She will return to see me in 2 weeks or call sooner if needed   [name], MD 06/26/2018, 3:17 PM

## 2018-06-26 NOTE — Telephone Encounter (Signed)
She will need to come in 

## 2018-06-26 NOTE — Telephone Encounter (Signed)
Called patient & per provider informed that She will need to come in. Transferred to [facility]ash to make appointment

## 2018-06-27 ENCOUNTER — Ambulatory Visit (HOSPITAL_COMMUNITY): Payer: Medicare HMO | Admitting: Psychiatry

## 2018-07-03 ENCOUNTER — Encounter (HOSPITAL_COMMUNITY): Payer: Self-pay | Admitting: Psychiatry

## 2018-07-03 ENCOUNTER — Ambulatory Visit (INDEPENDENT_AMBULATORY_CARE_PROVIDER_SITE_OTHER): Payer: Medicare HMO | Admitting: Psychiatry

## 2018-07-03 ENCOUNTER — Emergency Department (HOSPITAL_COMMUNITY)
Admission: EM | Admit: 2018-07-03 | Discharge: 2018-07-04 | Disposition: A | Discharge status: Other Acute Inpatient Hospital | Payer: Medicare HMO | Attending: Emergency Medicine | Admitting: Emergency Medicine

## 2018-07-03 ENCOUNTER — Encounter (HOSPITAL_COMMUNITY): Payer: Self-pay | Admitting: Emergency Medicine

## 2018-07-03 VITALS — BP 168/90 | HR 73 | Ht 64.0 in | Wt 299.0 lb

## 2018-07-03 DIAGNOSIS — F3130 Bipolar disorder, current episode depressed, mild or moderate severity, unspecified: Secondary | ICD-10-CM | POA: Diagnosis not present

## 2018-07-03 DIAGNOSIS — J45998 Other asthma: Secondary | ICD-10-CM | POA: Diagnosis not present

## 2018-07-03 DIAGNOSIS — F315 Bipolar disorder, current episode depressed, severe, with psychotic features: Secondary | ICD-10-CM | POA: Diagnosis not present

## 2018-07-03 DIAGNOSIS — G4700 Insomnia, unspecified: Secondary | ICD-10-CM | POA: Diagnosis not present

## 2018-07-03 DIAGNOSIS — E119 Type 2 diabetes mellitus without complications: Secondary | ICD-10-CM | POA: Diagnosis not present

## 2018-07-03 DIAGNOSIS — Z046 Encounter for general psychiatric examination, requested by authority: Secondary | ICD-10-CM | POA: Diagnosis present

## 2018-07-03 DIAGNOSIS — I10 Essential (primary) hypertension: Secondary | ICD-10-CM | POA: Insufficient documentation

## 2018-07-03 DIAGNOSIS — F29 Unspecified psychosis not due to a substance or known physiological condition: Secondary | ICD-10-CM

## 2018-07-03 DIAGNOSIS — R442 Other hallucinations: Secondary | ICD-10-CM | POA: Insufficient documentation

## 2018-07-03 DIAGNOSIS — Z79899 Other long term (current) drug therapy: Secondary | ICD-10-CM | POA: Diagnosis not present

## 2018-07-03 DIAGNOSIS — Z794 Long term (current) use of insulin: Secondary | ICD-10-CM | POA: Diagnosis not present

## 2018-07-03 DIAGNOSIS — F419 Anxiety disorder, unspecified: Secondary | ICD-10-CM | POA: Diagnosis not present

## 2018-07-03 LAB — URINALYSIS, ROUTINE W REFLEX MICROSCOPIC
BACTERIA UA: NONE SEEN
Bilirubin Urine: NEGATIVE
Glucose, UA: >=500 mg/dL — AB
Hgb urine dipstick: NEGATIVE
KETONES UR: NEGATIVE mg/dL
Leukocytes, UA: NEGATIVE
Nitrite: NEGATIVE
PH: 5 (ref 5.0–8.0)
Protein, ur: NEGATIVE mg/dL
SPECIFIC GRAVITY, URINE: 1.027 (ref 1.005–1.030)

## 2018-07-03 LAB — COMPREHENSIVE METABOLIC PANEL
ALT: 27 U/L (ref 0–44)
AST: 20 U/L (ref 15–41)
Albumin: 3.7 g/dL (ref 3.5–5.0)
Alkaline Phosphatase: 93 U/L (ref 38–126)
Anion gap: 10 (ref 5–15)
BILIRUBIN TOTAL: 0.7 mg/dL (ref 0.3–1.2)
BUN: 23 mg/dL — ABNORMAL HIGH (ref 6–20)
CHLORIDE: 98 mmol/L (ref 98–111)
CO2: 24 mmol/L (ref 22–32)
Calcium: 8.6 mg/dL — ABNORMAL LOW (ref 8.9–10.3)
Creatinine, Ser: 1.06 mg/dL — ABNORMAL HIGH (ref 0.44–1.00)
GFR calc non Af Amer: 57 mL/min — ABNORMAL LOW (ref >60)
GLUCOSE: 367 mg/dL — AB (ref 70–99)
Potassium: 3.4 mmol/L — ABNORMAL LOW (ref 3.5–5.1)
Sodium: 132 mmol/L — ABNORMAL LOW (ref 135–145)
Total Protein: 7.3 g/dL (ref 6.5–8.1)

## 2018-07-03 LAB — RAPID URINE DRUG SCREEN, HOSP PERFORMED
Amphetamines: NOT DETECTED
BARBITURATES: NOT DETECTED
BENZODIAZEPINES: NOT DETECTED
COCAINE: NOT DETECTED
OPIATES: NOT DETECTED
TETRAHYDROCANNABINOL: NOT DETECTED

## 2018-07-03 LAB — CBG MONITORING, ED: Glucose-Capillary: 339 mg/dL — ABNORMAL HIGH (ref 70–99)

## 2018-07-03 LAB — ETHANOL

## 2018-07-03 MED ORDER — LEVOTHYROXINE SODIUM 50 MCG PO TABS
50.0000 ug | ORAL_TABLET | Freq: Every day | ORAL | Status: DC
  Administered 2018-07-04: 50 ug via ORAL
  Filled 2018-07-03: qty 1

## 2018-07-03 MED ORDER — METOPROLOL SUCCINATE ER 25 MG PO TB24
50.0000 mg | ORAL_TABLET | Freq: Every day | ORAL | Status: DC
  Administered 2018-07-04: 50 mg via ORAL
  Filled 2018-07-03: qty 2

## 2018-07-03 MED ORDER — AMLODIPINE BESYLATE 5 MG PO TABS
10.0000 mg | ORAL_TABLET | Freq: Every day | ORAL | Status: DC
  Administered 2018-07-03 – 2018-07-04 (×2): 10 mg via ORAL
  Filled 2018-07-03 (×2): qty 2

## 2018-07-03 MED ORDER — BUSPIRONE HCL 15 MG PO TABS
30.0000 mg | ORAL_TABLET | Freq: Three times a day (TID) | ORAL | Status: DC
  Administered 2018-07-03 – 2018-07-04 (×2): 30 mg via ORAL
  Filled 2018-07-03 (×4): qty 2
  Filled 2018-07-03: qty 6

## 2018-07-03 MED ORDER — HYDROXYZINE HCL 25 MG PO TABS
25.0000 mg | ORAL_TABLET | Freq: Three times a day (TID) | ORAL | Status: DC | PRN
  Administered 2018-07-03: 25 mg via ORAL
  Filled 2018-07-03: qty 1

## 2018-07-03 MED ORDER — TOPIRAMATE 25 MG PO TABS
50.0000 mg | ORAL_TABLET | Freq: Three times a day (TID) | ORAL | Status: DC
  Administered 2018-07-03 – 2018-07-04 (×2): 50 mg via ORAL
  Filled 2018-07-03 (×2): qty 2

## 2018-07-03 MED ORDER — MONTELUKAST SODIUM 10 MG PO TABS
10.0000 mg | ORAL_TABLET | Freq: Every day | ORAL | Status: DC
  Administered 2018-07-03: 10 mg via ORAL
  Filled 2018-07-03: qty 1

## 2018-07-03 MED ORDER — HYDROCHLOROTHIAZIDE 25 MG PO TABS
12.5000 mg | ORAL_TABLET | Freq: Every day | ORAL | Status: DC
  Administered 2018-07-04: 12.5 mg via ORAL
  Filled 2018-07-03 (×3): qty 1

## 2018-07-03 MED ORDER — GLIPIZIDE 5 MG PO TABS
5.0000 mg | ORAL_TABLET | Freq: Two times a day (BID) | ORAL | Status: DC
  Administered 2018-07-04: 5 mg via ORAL
  Filled 2018-07-03 (×4): qty 1

## 2018-07-03 MED ORDER — ATORVASTATIN CALCIUM 40 MG PO TABS
80.0000 mg | ORAL_TABLET | Freq: Every morning | ORAL | Status: DC
  Administered 2018-07-04: 80 mg via ORAL
  Filled 2018-07-03: qty 2

## 2018-07-03 MED ORDER — ESCITALOPRAM OXALATE 10 MG PO TABS
20.0000 mg | ORAL_TABLET | Freq: Two times a day (BID) | ORAL | Status: DC
  Administered 2018-07-03 – 2018-07-04 (×2): 20 mg via ORAL
  Filled 2018-07-03 (×2): qty 2

## 2018-07-03 MED ORDER — INSULIN GLARGINE 100 UNIT/ML ~~LOC~~ SOLN
75.0000 [IU] | Freq: Two times a day (BID) | SUBCUTANEOUS | Status: DC
  Administered 2018-07-03 – 2018-07-04 (×2): 75 [IU] via SUBCUTANEOUS
  Filled 2018-07-03 (×5): qty 0.75

## 2018-07-03 MED ORDER — ALBUTEROL SULFATE HFA 108 (90 BASE) MCG/ACT IN AERS
2.0000 | INHALATION_SPRAY | Freq: Four times a day (QID) | RESPIRATORY_TRACT | Status: DC | PRN
  Filled 2018-07-03: qty 6.7

## 2018-07-03 MED ORDER — INSULIN ASPART 100 UNIT/ML ~~LOC~~ SOLN
0.0000 [IU] | SUBCUTANEOUS | Status: DC
  Administered 2018-07-03: 10 [IU] via SUBCUTANEOUS
  Administered 2018-07-04: 11 [IU] via SUBCUTANEOUS
  Filled 2018-07-03 (×2): qty 1

## 2018-07-03 NOTE — ED Notes (Signed)
Pt changing in paper scrubs. Will place belongings in locker.

## 2018-07-03 NOTE — ED Notes (Signed)
Pt's daughter brought clothes for when pt is transferred. Placed in locker.

## 2018-07-03 NOTE — ED Notes (Addendum)
[name] (pt's daughter)  [phone]

## 2018-07-03 NOTE — Progress Notes (Signed)
BH MD/PA/NP OP Progress Note  07/03/2018 1:56 PM [name]  MRN:  [id number]  Chief Complaint:  Chief Complaint    Depression; Anxiety; Hallucinations; Follow-up     HPI: This patient is a 57 year oldwidowed white female who lives alonein [place]. She has 2 grown daughters and one 59 year old grandson. She is on disability for both back pain and bipolar disorder.  The patient was referred by Dr. Lolly Mustache from our [facility] clinic. The patient would like to start coming here as it is too far for her to drive to the other clinic.  The patient states that she has had problems with mood since her mid 3s. She began losing her temper having severe outburst mood swings and lashing out at people. 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 She began being paranoid and accusing people of doing things behind her back or talking about her. She was hospitalized 4 times the last time being at [facility] in 2011. At that time she was very depressed hallucinating and hearing command hallucinations to cut herself or kill herself. She was Stabilized on a combination of lithium Wellbutrin and Risperdal.She had seen Dr. Lolly Mustache for a while and had been transferred to me after it became too far to drive to the [place] clinic.  The patient and her daughter return as a work in today.  The patient was just seen about a week ago.  At that time she stated that she was having visions of the devil and other odd visual hallucinations.  I increased her lurasidone but it has not helped.  She is not sleeping at night and is afraid to go to sleep.  She sees various odd visions and delusional information about her husband not being dead.  She thinks her husband has come back to life to haunt her.  She sees maximum can men around her and fist coming out of her mattress holding up [place] money.  For the first time today she started to see  these visions when she is not at her house but at her daughter's house.  She is unsure why these things are happening now but she recently heard  the minister preach about spiritual conflicts with the devil.  Is also the anniversary of her wedding anniversary.  At any rate she is so delusional now that I think she needs hospitalization to get this straightened out and her daughter agrees. Visit Diagnosis:    ICD-10-CM   1. Bipolar I disorder, most recent episode depressed (HCC) F31.30     Past Psychiatric History: 4 previous hospitalizations for bipolar disorder, long-term outpatient treatment  Past Medical History:  Past Medical History:  Diagnosis Date  . Asthma   . Back pain   . Chest pain   . Diabetes mellitus type II   . Headache   . High cholesterol   . Hypertension   . Obesity, morbid (HCC)   . Other and unspecified bipolar disorders   . 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 Thyroid disease     Past Surgical History:  Procedure Laterality Date  . TUBAL LIGATION     Bilateral    Family Psychiatric History: See below  Family History:  Family History  Problem Relation Age of Onset  . Heart attack Maternal Uncle   . Depression Maternal Uncle   . Depression Mother   . Alcohol abuse Maternal Grandfather   . Alcohol abuse Maternal Uncle   .  Depression Maternal Grandmother     Social History:  Social History   Socioeconomic History  . Marital status: Married    Spouse name: Not on file  . Number of children: Not on file  . Years of education: Not on file  . Highest education level: Not on file  Occupational History  . Occupation: Disabled    Associate Professor: UNEMPLOYED  Social Needs  . Financial resource strain: Not on file  . Food insecurity:    Worry: Not on file    Inability: Not on file  . Transportation needs:    Medical: Not on file    Non-medical: Not on file  Tobacco Use  . Smoking status: Never Smoker  . Smokeless tobacco: Never Used  Substance and Sexual  Activity  . Alcohol use: No    Alcohol/week: 0.0 standard drinks  . Drug use: No    Comment: 05/13/2016 per pt no  . Sexual activity: Yes    Birth control/protection: Surgical  Lifestyle  . Physical activity:    Days per week: Not on file    Minutes per session: Not on file  . Stress: Not on file  Relationships  . Social connections:    Talks on phone: Not on file    Gets together: Not on file    Attends religious service: Not on file    Active member of club or organization: Not on file    Attends meetings of clubs or organizations: Not on file    Relationship status: Not on file  Other Topics Concern  . Not on file  Social History Narrative   Married   No regular exercise    Allergies:  Allergies  Allergen Reactions  . Amoxicillin Other (See Comments)    Oral swelling   . Doxycycline Nausea And Vomiting  . Levofloxacin Other (See Comments)    headache  . Propoxyphene N-Acetaminophen     Metabolic Disorder Labs: Lab Results  Component Value Date   HGBA1C 11.0 (H) 10/14/2014   MPG 269 (H) 10/14/2014   MPG 206 (H) 09/05/2010   No results found for: PROLACTIN Lab Results  Component Value Date   CHOL (H) 06/28/2009    388        ATP III CLASSIFICATION:  <200     mg/dL   Desirable  616-073  mg/dL   Borderline High  >=710    mg/dL   High          TRIG 626 (H) 06/28/2009   HDL 42 06/28/2009   CHOLHDL 9.2 06/28/2009   VLDL UNABLE TO CALCULATE IF TRIGLYCERIDE OVER 400 mg/dL 94/85/4627   LDLCALC  03/50/0938    UNABLE TO CALCULATE IF TRIGLYCERIDE OVER 400 mg/dL        Total Cholesterol/HDL:CHD Risk Coronary Heart Disease Risk Table                     Men   Women  1/2 Average Risk   3.4   3.3  Average Risk       5.0   4.4  2 X Average Risk   9.6   7.1  3 X Average Risk  23.4   11.0        Use the calculated Patient Ratio above and the CHD Risk Table to determine the patient's CHD Risk.        ATP III CLASSIFICATION (LDL):  <100     mg/dL   Optimal   182-993  mg/dL  Near or Above                    Optimal  130-159  mg/dL   Borderline  161-096  mg/dL   High  >045     mg/dL   Very High   Lab Results  Component Value Date   TSH 4.88 (H) 02/03/2016   TSH 4.395 10/14/2014    Therapeutic Level Labs: Lab Results  Component Value Date   LITHIUM 0.6 (L) 07/07/2016   LITHIUM 0.6 (L) 03/10/2016   No results found for: VALPROATE No components found for:  CBMZ  Current Medications: Current Outpatient Medications  Medication Sig Dispense Refill  . albuterol (PROVENTIL HFA;VENTOLIN HFA) 108 (90 BASE) MCG/ACT inhaler Inhale 2 puffs into the lungs every 6 (six) hours as needed for wheezing or shortness of breath.     Marland Kitchen amLODipine (NORVASC) 10 MG tablet Take 10 mg by mouth daily.     Marland Kitchen atorvastatin (LIPITOR) 80 MG tablet Take 80 mg by mouth every morning.     . busPIRone (BUSPAR) 30 MG tablet Take 1 tablet (30 mg total) by mouth 3 (three) times daily. 270 tablet 2  . co-enzyme Q-10 30 MG capsule Take 60 mg by mouth every morning.    . escitalopram (LEXAPRO) 20 MG tablet Take 1 tablet (20 mg total) by mouth 2 (two) times daily. 180 tablet 2  . fluticasone (FLONASE) 50 MCG/ACT nasal spray Place 1 spray into the nose daily as needed for allergies.     Marland Kitchen glipiZIDE (GLUCOTROL) 5 MG tablet TAKE 1 TABLET(5 MG) BY MOUTH TWICE DAILY BEFORE MEALS    . hydrochlorothiazide (MICROZIDE) 12.5 MG capsule Take 12.5 mg by mouth daily.    . hydrOXYzine (ATARAX/VISTARIL) 25 MG tablet Take one three times a day for anxiety and two at bedtime for sleep 150 tablet 2  . hydrOXYzine (VISTARIL) 100 MG capsule Take 1 capsule (100 mg total) by mouth at bedtime. 30 capsule 2  . ibuprofen (ADVIL,MOTRIN) 600 MG tablet Take 1 tablet (600 mg total) by mouth every 6 (six) hours as needed. 30 tablet 0  . insulin NPH-regular Human (NOVOLIN 70/30) (70-30) 100 UNIT/ML injection Inject 300 Units into the skin daily with breakfast.     . levothyroxine (SYNTHROID, LEVOTHROID) 50  MCG tablet Take 50 mcg by mouth daily before breakfast.     . lurasidone (LATUDA) 40 MG TABS tablet Take 1 tablet (40 mg total) by mouth daily after supper. 90 tablet 2  . metoprolol succinate (TOPROL-XL) 50 MG 24 hr tablet Take 50 mg by mouth daily.     . montelukast (SINGULAIR) 10 MG tablet Take 10 mg by mouth at bedtime.    . Multiple Vitamin (MULTIVITAMIN WITH MINERALS) TABS tablet Take 1 tablet by mouth daily.    Marland Kitchen topiramate (TOPAMAX) 50 MG tablet Take 50 mg by mouth 3 (three) times daily.      No current facility-administered medications for this visit.      Musculoskeletal: Strength & Muscle Tone: within normal limits Gait & Station: normal Patient leans: N/A  Psychiatric Specialty Exam: Review of Systems  Musculoskeletal: Positive for back pain.  Psychiatric/Behavioral: Positive for hallucinations. The patient is nervous/anxious and has insomnia.   All other systems reviewed and are negative.   Blood pressure (!) 168/90, pulse 73, height 5\' 4"  (1.626 m), weight 299 lb (135.6 kg), SpO2 96 %.Body mass index is 51.32 kg/m.  General Appearance: Casual and Fairly Groomed  Eye Contact:  Good  Speech:  Clear and Coherent  Volume:  Normal  Mood:  Anxious  Affect:  Appropriate and Flat  Thought Process:  Goal Directed  Orientation:  Full (Time, Place, and Person)  Thought Content: Delusions, Hallucinations: Auditory Visual and Paranoid Ideation   Suicidal Thoughts:  No  Homicidal Thoughts:  No  Memory:  Immediate;   Good Recent;   Good Remote;   Poor  Judgement:  Impaired  Insight:  Lacking  Psychomotor Activity:  Decreased  Concentration:  Concentration: Poor and Attention Span: Poor  Recall:  Fair  Fund of Knowledge: Fair  Language: Good  Akathisia:  No  Handed:  Right  AIMS (if indicated): not done  Assets:  Communication Skills Desire for Improvement Resilience Social Support Talents/Skills  ADL's:  Intact  Cognition: WNL  Sleep:  Poor   Screenings: MDI      Office Visit from [facility]/13/2016 [facility]  Total Score (max 50)  25    PHQ2-9     Counselor from 06/21/2016 in [facility]  PHQ-2 Total Score  4  PHQ-9 Total Score  15       Assessment and Plan: She is a 57 year old patient with bipolar disorder who seems to have lapsed into a delusional episode.  She is paranoid feeling like her husband is coming back from the dad, delusional confused and unable to sleep.  Given the severity of the symptoms I think hospitalization is the best bet.  I have instructed the patient and daughter to go to the emergency department for psychiatric evaluation for admission.   [name], MD 07/03/2018, 1:56 PM

## 2018-07-03 NOTE — ED Provider Notes (Signed)
Emergency Department Provider Note   I have reviewed the triage vital signs and the nursing notes.   HISTORY  Chief Complaint V70.1; Delusional; and Hallucinations   HPI [name] is a 57 y.o. female with multiple medical problems documented below the presents to the emergency department today with worsening delusions, hallucinations and paranoia.  She is a coming by her daughter and is seen her psychiatrist a few times over the last couple weeks with changes in medications that do not seem to be helping.  She has more more pervasive thoughts of evil spirits in her also to try to start fires and trying to run her out of there.  She still is identifying some of them as abnormal but they are becoming more prevalent.  She is also having delusions that her dead husband was actually not killed and affect his own death with assistance of multiple neighbors.  She is not suicidal or homicidal.  According the daughter she is still thinks some of these things are real.  Dr. Tenny Craw is a psychiatrist who recommended she come here for likely inpatient management secondary to not responding to outpatient medications.  Only medical complaint she has hyperglycemia and increased urination. No other associated or modifying symptoms.    Past Medical History:  Diagnosis Date  . Asthma   . Back pain   . Chest pain   . Diabetes mellitus type II   . Headache   . High cholesterol   . Hypertension   . Obesity, morbid (HCC)   . Other and unspecified bipolar disorders   . 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 Thyroid disease     Patient Active Problem List   Diagnosis Date Noted  . Bipolar disorder, unspecified (HCC) 02/28/2012  . Bipolar 1 disorder, depressed (HCC) 12/27/2011  . OBESITY, MORBID 07/07/2009  . OTHER AND UNSPECIFIED BIPOLAR DISORDERS 07/07/2009  . HYPERTENSION 07/07/2009  . ASTHMA 07/07/2009  . CHEST PAIN 07/07/2009    Past Surgical History:  Procedure Laterality Date  . TUBAL  LIGATION     Bilateral    Current Outpatient Rx  . Order #: [id number] Class: Historical Med  . Order #: [id number] Class: Historical Med  . Order #: [id number] Class: Historical Med  . Order #: [id number] Class: Normal  . Order #: [id number] Class: Historical Med  . Order #: [id number] Class: Normal  . Order #: [id number] Class: Historical Med  . Order #: [id number] Class: Historical Med  . Order #: [id number] Class: Historical Med  . Order #: [id number] Class: Normal  . Order #: [id number] Class: Historical Med  . Order #: [id number] Class: Historical Med  . Order #: [id number] Class: Normal  . Order #: [id number] Class: Historical Med  . Order #: [id number] Class: Historical Med  . Order #: [id number] Class: Historical Med  . Order #: [id number] Class: Historical Med    Allergies Amoxicillin; Doxycycline; Levofloxacin; and Propoxyphene n-acetaminophen  Family History  Problem Relation Age of Onset  . Heart attack Maternal Uncle   . Depression Maternal Uncle   . Depression Mother   . Alcohol abuse Maternal Grandfather   . Alcohol abuse Maternal Uncle   . Depression Maternal Grandmother     Social History Social History   Tobacco Use  . Smoking status: Never Smoker  . Smokeless tobacco: Never Used  Substance Use Topics  . Alcohol use: No    Alcohol/week: 0.0 standard drinks  . Drug use: No    Comment: 05/13/2016 per pt no    Review of Systems  All other systems negative except as documented in  the HPI. All pertinent positives and negatives as reviewed in the HPI. ____________________________________________   PHYSICAL EXAM:  VITAL SIGNS: ED Triage Vitals  Enc Vitals Group     BP 07/03/18 1405 (!) 157/88     Pulse Rate 07/03/18 1405 70     Resp 07/03/18 1405 18     Temp 07/03/18 1405 98.5 F (36.9 C)     Temp Source 07/03/18 1405 Oral     SpO2 07/03/18 1405 99 %     Weight 07/03/18 1405 299 lb (135.6 kg)     Height 07/03/18 1405 5\' 4"  (1.626 m)     Head Circumference --      Peak Flow  --      Pain Score 07/03/18 1408 10     Pain Loc --      Pain Edu? --      Excl. in GC? --     Constitutional: Alert and oriented. Well appearing and in no acute distress. Eyes: Conjunctivae are normal. PERRL. EOMI. Head: Atraumatic. Nose: No congestion/rhinnorhea. Mouth/Throat: Mucous membranes are moist.  Oropharynx non-erythematous. Neck: No stridor.  No meningeal signs.   Cardiovascular: Normal rate, regular rhythm. Good peripheral circulation. Grossly normal heart sounds.   Respiratory: Normal respiratory effort.  No retractions. Lungs CTAB. Gastrointestinal: Soft and nontender. No distention.  Musculoskeletal: No lower extremity tenderness nor edema. No gross deformities of extremities. Neurologic:  Normal speech and language. No gross focal neurologic deficits are appreciated.  Skin:  Skin is warm, dry and intact. No rash noted.  ____________________________________________   LABS (all labs ordered are listed, but only abnormal results are displayed)  Labs Reviewed  URINALYSIS, ROUTINE W REFLEX MICROSCOPIC - Abnormal; Notable for the following components:      Result Value   APPearance HAZY (*)    Glucose, UA >=500 (*)    All other components within normal limits  COMPREHENSIVE METABOLIC PANEL - Abnormal; Notable for the following components:   Sodium 132 (*)    Potassium 3.4 (*)    Glucose, Bld 367 (*)    BUN 23 (*)    Creatinine, Ser 1.06 (*)    Calcium 8.6 (*)    GFR calc non Af Amer 57 (*)    All other components within normal limits  CBG MONITORING, ED - Abnormal; Notable for the following components:   Glucose-Capillary 339 (*)    All other components within normal limits  RAPID URINE DRUG SCREEN, HOSP PERFORMED  ETHANOL  CBC WITH DIFFERENTIAL/PLATELET   ____________________________________________  EKG   EKG Interpretation  Date/Time:  Monday July 03 2018 17:31:33 EDT Ventricular Rate:  63 PR Interval:    QRS Duration: 88 QT  Interval:  442 QTC Calculation: 453 R Axis:   33 Text Interpretation:  Sinus rhythm Borderline prolonged PR interval Low voltage, precordial leads Borderline T abnormalities, diffuse leads Baseline wander in lead(s) V1 No significant change since last tracing Confirmed by Doug Sou [phone]) on 07/03/2018 5:37:58 PM       ____________________________________________    INITIAL IMPRESSION / ASSESSMENT AND PLAN / ED COURSE  Worsening psychosis not responding to outpatient medication.  Becoming more pervasive.  Will medically clear and consult TTS. Significantly hyperglycemic.   TTS recommends admission when glucose improves. Patient is on enormous doses of insulin so I discussed with pharmacist and will institute sliding scale along with slightly lower doses for now.  Pertinent labs & imaging results that were available during my care of the patient were reviewed by  me and considered in my medical decision making (see chart for details).  ____________________________________________  FINAL CLINICAL IMPRESSION(S) / ED DIAGNOSES  Final diagnoses:  Psychosis, unspecified psychosis type (HCC)     MEDICATIONS GIVEN DURING THIS VISIT:  Medications  albuterol (PROVENTIL HFA;VENTOLIN HFA) 108 (90 Base) MCG/ACT inhaler 2 puff (has no administration in time range)  amLODipine (NORVASC) tablet 10 mg (10 mg Oral Given 07/03/18 2350)  atorvastatin (LIPITOR) tablet 80 mg (has no administration in time range)  busPIRone (BUSPAR) tablet 30 mg (30 mg Oral Given 07/03/18 2350)  escitalopram (LEXAPRO) tablet 20 mg (20 mg Oral Given 07/03/18 2350)  glipiZIDE (GLUCOTROL) tablet 5 mg (has no administration in time range)  hydrochlorothiazide (MICROZIDE) capsule 12.5 mg (has no administration in time range)  hydrOXYzine (ATARAX/VISTARIL) tablet 25 mg (25 mg Oral Given 07/03/18 2350)  topiramate (TOPAMAX) tablet 50 mg (50 mg Oral Given 07/03/18 2350)  metoprolol succinate (TOPROL-XL) 24 hr tablet 50 mg  (50 mg Oral Not Given 07/03/18 2353)  montelukast (SINGULAIR) tablet 10 mg (10 mg Oral Given 07/03/18 2349)  levothyroxine (SYNTHROID, LEVOTHROID) tablet 50 mcg (has no administration in time range)  insulin aspart (novoLOG) injection 0-15 Units (10 Units Subcutaneous Given 07/03/18 2351)  insulin glargine (LANTUS) injection 75 Units (75 Units Subcutaneous Given 07/03/18 2351)     NEW OUTPATIENT MEDICATIONS STARTED DURING THIS VISIT:  New Prescriptions   No medications on file    Note:  This note was prepared with assistance of Dragon voice recognition software. Occasional wrong-word or sound-a-like substitutions may have occurred due to the inherent limitations of voice recognition software.   Manami Tutor, Barbara Cower, MD 07/04/18 [phone]

## 2018-07-03 NOTE — ED Triage Notes (Signed)
Pt reports being referred from her psychiatrist's office (Dr. Tenny Craw).  States she has been seeing visions of the devil and evil spirits and hearing frank sinatra and rock and roll music.  Also has a fear of her husband returning from the dead.  Meds increased last week with no improvement.  Denies SI.

## 2018-07-03 NOTE — Patient Instructions (Signed)
Go directly to /ED for psychiatric admission, To ED-Patient is psychotic, delusional, unable to sleep, paranoid, needs med adjustment, constant visual hallucinations

## 2018-07-03 NOTE — Progress Notes (Signed)
Per Fransisca Kaufmann, NP patient meets in patient criteria. TTS securing placement. Patient gave permission to notify daughter's when transferred. [name]: [phone] Carri: [phone]

## 2018-07-03 NOTE — ED Notes (Signed)
Per Lenox Ponds with [facility] states pt unable to come until CBG below 350 for 24 hours, EDP made aware
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
## 2018-07-03 NOTE — Progress Notes (Signed)
Patient accepted to [facility] 506-1. Patient's daughter notified.

## 2018-07-04 ENCOUNTER — Other Ambulatory Visit: Payer: Self-pay

## 2018-07-04 ENCOUNTER — Encounter (HOSPITAL_COMMUNITY): Payer: Self-pay

## 2018-07-04 ENCOUNTER — Inpatient Hospital Stay (HOSPITAL_COMMUNITY)
Admission: AD | Admit: 2018-07-04 | Discharge: 2018-07-07 | DRG: 885 | Disposition: A | Discharge status: Home / Self Care | Payer: Medicare HMO | Source: Intra-hospital | Attending: Psychiatry | Admitting: Psychiatry

## 2018-07-04 DIAGNOSIS — Z79899 Other long term (current) drug therapy: Secondary | ICD-10-CM

## 2018-07-04 DIAGNOSIS — Z794 Long term (current) use of insulin: Secondary | ICD-10-CM

## 2018-07-04 DIAGNOSIS — Z7951 Long term (current) use of inhaled steroids: Secondary | ICD-10-CM

## 2018-07-04 DIAGNOSIS — F312 Bipolar disorder, current episode manic severe with psychotic features: Secondary | ICD-10-CM | POA: Diagnosis not present

## 2018-07-04 DIAGNOSIS — Z888 Allergy status to other drugs, medicaments and biological substances status: Secondary | ICD-10-CM | POA: Diagnosis not present

## 2018-07-04 DIAGNOSIS — F3164 Bipolar disorder, current episode mixed, severe, with psychotic features: Secondary | ICD-10-CM | POA: Diagnosis present

## 2018-07-04 DIAGNOSIS — G8929 Other chronic pain: Secondary | ICD-10-CM | POA: Diagnosis present

## 2018-07-04 DIAGNOSIS — M545 Low back pain: Secondary | ICD-10-CM | POA: Diagnosis present

## 2018-07-04 DIAGNOSIS — J45909 Unspecified asthma, uncomplicated: Secondary | ICD-10-CM | POA: Diagnosis present

## 2018-07-04 DIAGNOSIS — G4700 Insomnia, unspecified: Secondary | ICD-10-CM | POA: Diagnosis present

## 2018-07-04 DIAGNOSIS — F419 Anxiety disorder, unspecified: Secondary | ICD-10-CM | POA: Diagnosis present

## 2018-07-04 DIAGNOSIS — E119 Type 2 diabetes mellitus without complications: Secondary | ICD-10-CM | POA: Diagnosis present

## 2018-07-04 DIAGNOSIS — F315 Bipolar disorder, current episode depressed, severe, with psychotic features: Secondary | ICD-10-CM | POA: Diagnosis not present

## 2018-07-04 DIAGNOSIS — Z881 Allergy status to other antibiotic agents status: Secondary | ICD-10-CM | POA: Diagnosis not present

## 2018-07-04 DIAGNOSIS — E039 Hypothyroidism, unspecified: Secondary | ICD-10-CM | POA: Diagnosis present

## 2018-07-04 DIAGNOSIS — I10 Essential (primary) hypertension: Secondary | ICD-10-CM | POA: Diagnosis present

## 2018-07-04 DIAGNOSIS — E7800 Pure hypercholesterolemia, unspecified: Secondary | ICD-10-CM | POA: Diagnosis present

## 2018-07-04 DIAGNOSIS — Z6841 Body Mass Index (BMI) 40.0 and over, adult: Secondary | ICD-10-CM | POA: Diagnosis not present

## 2018-07-04 DIAGNOSIS — Z915 Personal history of self-harm: Secondary | ICD-10-CM | POA: Diagnosis not present

## 2018-07-04 LAB — CBC WITH DIFFERENTIAL/PLATELET
Basophils Absolute: 0 10*3/uL (ref 0.0–0.1)
Basophils Relative: 1 %
EOS PCT: 3 %
Eosinophils Absolute: 0.2 10*3/uL (ref 0.0–0.5)
HEMATOCRIT: 38.4 % (ref 36.0–46.0)
HEMOGLOBIN: 13.5 g/dL (ref 12.0–15.0)
Lymphocytes Relative: 37 %
Lymphs Abs: 1.9 10*3/uL (ref 0.7–4.0)
MCH: 29.1 pg (ref 26.0–34.0)
MCHC: 35.2 g/dL (ref 30.0–36.0)
MCV: 82.8 fL (ref 80.0–100.0)
Monocytes Absolute: 0.3 10*3/uL (ref 0.1–1.0)
Monocytes Relative: 7 %
Neutro Abs: 2.6 10*3/uL (ref 1.7–7.7)
Neutrophils Relative %: 52 %
Platelets: 275 10*3/uL (ref 150–400)
RBC: 4.64 MIL/uL (ref 3.87–5.11)
RDW: 12.2 % (ref 11.5–15.5)
WBC: 5 10*3/uL (ref 4.0–10.5)

## 2018-07-04 LAB — GLUCOSE, CAPILLARY
Glucose-Capillary: 416 mg/dL — ABNORMAL HIGH (ref 70–99)
Glucose-Capillary: 321 mg/dL — ABNORMAL HIGH (ref 70–99)
Glucose-Capillary: 318 mg/dL — ABNORMAL HIGH (ref 70–99)

## 2018-07-04 LAB — CBG MONITORING, ED
GLUCOSE-CAPILLARY: 263 mg/dL — AB (ref 70–99)
Glucose-Capillary: 302 mg/dL — ABNORMAL HIGH (ref 70–99)

## 2018-07-04 MED ORDER — ESCITALOPRAM OXALATE 20 MG PO TABS
20.0000 mg | ORAL_TABLET | Freq: Two times a day (BID) | ORAL | Status: DC
  Administered 2018-07-04 – 2018-07-07 (×7): 20 mg via ORAL
  Filled 2018-07-04 (×2): qty 1
  Filled 2018-07-04: qty 2
  Filled 2018-07-04 (×8): qty 1
  Filled 2018-07-04 (×2): qty 2
  Filled 2018-07-04: qty 1

## 2018-07-04 MED ORDER — INSULIN ASPART 100 UNIT/ML ~~LOC~~ SOLN
0.0000 [IU] | Freq: Every day | SUBCUTANEOUS | Status: DC
  Administered 2018-07-04: 4 [IU] via SUBCUTANEOUS
  Administered 2018-07-05 – 2018-07-06 (×2): 3 [IU] via SUBCUTANEOUS

## 2018-07-04 MED ORDER — ATORVASTATIN CALCIUM 80 MG PO TABS
80.0000 mg | ORAL_TABLET | Freq: Every morning | ORAL | Status: DC
  Administered 2018-07-04 – 2018-07-07 (×4): 80 mg via ORAL
  Filled 2018-07-04: qty 1
  Filled 2018-07-04: qty 2
  Filled 2018-07-04 (×3): qty 1
  Filled 2018-07-04: qty 2
  Filled 2018-07-04 (×2): qty 1

## 2018-07-04 MED ORDER — ALBUTEROL SULFATE HFA 108 (90 BASE) MCG/ACT IN AERS: 2.0000 | INHALATION_SPRAY | Freq: Four times a day (QID) | RESPIRATORY_TRACT | Status: DC | PRN

## 2018-07-04 MED ORDER — LEVOTHYROXINE SODIUM 50 MCG PO TABS
50.0000 ug | ORAL_TABLET | Freq: Every day | ORAL | Status: DC
  Administered 2018-07-05 – 2018-07-07 (×3): 50 ug via ORAL
  Filled 2018-07-04 (×6): qty 1

## 2018-07-04 MED ORDER — INSULIN ASPART 100 UNIT/ML ~~LOC~~ SOLN
0.0000 [IU] | Freq: Three times a day (TID) | SUBCUTANEOUS | Status: DC
  Administered 2018-07-04: 20 [IU] via SUBCUTANEOUS

## 2018-07-04 MED ORDER — RISPERIDONE 1 MG PO TABS
1.0000 mg | ORAL_TABLET | Freq: Two times a day (BID) | ORAL | Status: DC
  Administered 2018-07-04 – 2018-07-05 (×2): 1 mg via ORAL
  Filled 2018-07-04 (×5): qty 1

## 2018-07-04 MED ORDER — INSULIN ASPART 100 UNIT/ML ~~LOC~~ SOLN
10.0000 [IU] | Freq: Three times a day (TID) | SUBCUTANEOUS | Status: DC
  Administered 2018-07-04 – 2018-07-07 (×10): 10 [IU] via SUBCUTANEOUS

## 2018-07-04 MED ORDER — INSULIN ASPART 100 UNIT/ML ~~LOC~~ SOLN
0.0000 [IU] | Freq: Three times a day (TID) | SUBCUTANEOUS | Status: DC
  Administered 2018-07-04: 11 [IU] via SUBCUTANEOUS
  Administered 2018-07-05 (×2): 8 [IU] via SUBCUTANEOUS
  Administered 2018-07-05: 15 [IU] via SUBCUTANEOUS
  Administered 2018-07-06 (×2): 3 [IU] via SUBCUTANEOUS
  Administered 2018-07-06: 5 [IU] via SUBCUTANEOUS
  Administered 2018-07-07: 8 [IU] via SUBCUTANEOUS

## 2018-07-04 MED ORDER — AMLODIPINE BESYLATE 10 MG PO TABS
10.0000 mg | ORAL_TABLET | Freq: Every day | ORAL | Status: DC
  Administered 2018-07-04 – 2018-07-07 (×4): 10 mg via ORAL
  Filled 2018-07-04 (×5): qty 1
  Filled 2018-07-04: qty 2
  Filled 2018-07-04: qty 1

## 2018-07-04 MED ORDER — BUSPIRONE HCL 15 MG PO TABS
30.0000 mg | ORAL_TABLET | Freq: Three times a day (TID) | ORAL | Status: DC
  Administered 2018-07-04: 30 mg via ORAL
  Filled 2018-07-04 (×7): qty 2

## 2018-07-04 MED ORDER — MAGNESIUM HYDROXIDE 400 MG/5ML PO SUSP: 30.0000 mL | Freq: Every day | ORAL | Status: DC | PRN

## 2018-07-04 MED ORDER — HYDROXYZINE HCL 50 MG PO TABS
50.0000 mg | ORAL_TABLET | Freq: Four times a day (QID) | ORAL | Status: DC | PRN
  Administered 2018-07-04 – 2018-07-06 (×3): 50 mg via ORAL
  Filled 2018-07-04 (×4): qty 1

## 2018-07-04 MED ORDER — INSULIN GLARGINE 100 UNIT/ML ~~LOC~~ SOLN
85.0000 [IU] | Freq: Two times a day (BID) | SUBCUTANEOUS | Status: DC
  Administered 2018-07-04 – 2018-07-07 (×6): 85 [IU] via SUBCUTANEOUS

## 2018-07-04 MED ORDER — GLIPIZIDE 5 MG PO TABS
5.0000 mg | ORAL_TABLET | Freq: Two times a day (BID) | ORAL | Status: DC
  Administered 2018-07-04 – 2018-07-05 (×3): 5 mg via ORAL
  Filled 2018-07-04 (×7): qty 1

## 2018-07-04 MED ORDER — INSULIN ASPART 100 UNIT/ML ~~LOC~~ SOLN
4.0000 [IU] | Freq: Three times a day (TID) | SUBCUTANEOUS | Status: DC
  Administered 2018-07-04: 4 [IU] via SUBCUTANEOUS

## 2018-07-04 MED ORDER — ALUM & MAG HYDROXIDE-SIMETH 200-200-20 MG/5ML PO SUSP: 30.0000 mL | ORAL | Status: DC | PRN

## 2018-07-04 MED ORDER — LURASIDONE HCL 40 MG PO TABS
40.0000 mg | ORAL_TABLET | Freq: Every day | ORAL | Status: DC
  Filled 2018-07-04 (×2): qty 1

## 2018-07-04 MED ORDER — TRAZODONE HCL 50 MG PO TABS
50.0000 mg | ORAL_TABLET | Freq: Every evening | ORAL | Status: DC | PRN
  Administered 2018-07-04 – 2018-07-06 (×5): 50 mg via ORAL
  Filled 2018-07-04 (×4): qty 1

## 2018-07-04 NOTE — Progress Notes (Signed)
Pt was observed in the dayroom, seen eating a snack. No interaction with peers.Pt attended wrap-up group. Pt appears depressed/anxious in affect and mood. Pt denies SI/HI/Pain at this time. Pt denies AVH. Pt c/o of insomnia and anxiety this evening. PRN vistaril and trazodone requested and given. CBG was 318 at bedtime. Lantus was increase to 85. Support and encouragement provided. Will continue with POC.

## 2018-07-04 NOTE — [facility] Group Notes (Signed)
LCSW Group Therapy 07/04/2018 1:15pm  Type of Therapy and Topic:  Group Therapy:  Change and Accountability  Participation Level:  Active  Description of Group In this group, patients discussed power and accountability for change.  The group identified the challenges related to accountability and the difficulty of accepting the outcomes of negative behaviors.  Patients were encouraged to openly discuss a challenge/change they could take responsibility for.  Patients discussed the use of "change talk" and positive thinking as ways to support achievement of personal goals.  The group discussed ways to give support and empowerment to peers.  Therapeutic Goals: 1. Patients will state the relationship between personal power and accountability in the change process 2. Patients will identify the positive and negative consequences of a personal choice they have made 3. Patients will identify one challenge/choice they will take responsibility for making 4. Patients will discuss the role of "change talk" and the impact of positive thinking as it supports successful personal change 5. Patients will verbalize support and affirmation of change efforts in peers  Summary of Patient Progress:  Stayed the entire time, engaged throughout.  "My faith is the thing that is helpful to me.  I call on the name of the Shaune Pollack, and it bring me inner peace."  Also talked about how she attends church regularly, and also she volunteers with others at the church to do [employer].  Therapeutic Modalities Solution Focused Brief Therapy Motivational Interviewing Cognitive Behavioral Therapy  [name], [place] 07/04/2018 12:15 PM

## 2018-07-04 NOTE — ED Notes (Signed)
Pharmacy called at this time to bring pt's meds for administration

## 2018-07-04 NOTE — H&P (Signed)
Psychiatric Admission Assessment Adult  Patient Identification: [name] MRN:  [id number] Date of Evaluation:  07/04/2018 Chief Complaint:  bipolar with psychotic features  Principal Diagnosis: Bipolar I disorder, current or most recent episode manic, with psychotic features with mixed features [facility]) Diagnosis:   Patient Active Problem List   Diagnosis Date Noted  . Bipolar I disorder, current or most recent episode manic, with psychotic features with mixed features (HCC) [F31.2] 07/04/2018  . Bipolar disorder, unspecified (HCC) [F31.9] 02/28/2012  . Bipolar 1 disorder, depressed (HCC) [F31.9] 12/27/2011  . OBESITY, MORBID [E66.01] 07/07/2009  . OTHER AND UNSPECIFIED BIPOLAR DISORDERS [F31.89] 07/07/2009  . HYPERTENSION [I10] 07/07/2009  . ASTHMA [J45.909] 07/07/2009  . CHEST PAIN [R07.9] 07/07/2009   History of Present Illness:   [name] is a 57 y/o F with history of bipolar I who was admitted voluntarily from [facility] ED where she presented brought in by her daughter at recommendation of her outpatient provider due to worsening symptoms of mania and psychosis including disorganized thoughts, delusions, paranoia, AH, and VH. Pt has also been having medication changes recently as an outpatient. She was medically cleared and then transferred to [facility] for additional treatment and stabilization.  Upon initial interview, pt shares, "I have been hallucinating for about the past 2 weeks. My doctor changed my medications to help it, but they just have been getting worse." Pt describes recent history of AH and VH of seeing "evil spirits." She explains delusional structure that her husband whom passed away about 1 year ago actually faked his death and he is currently living with another woman in [place]. Pt explains that she feels he practices "voodoo" and he put a curse on her causing her to see evil spirits. She also has seen other things such as the door handle spinning in her home  and emitting a smoke which smelled like cannabis smoke. She also describes seeing visions of her husband and his hand holding [place] currency and images of his face with horns coming out where his eyebrows should be. Pt reports her sleep has been poor, she has guilty feelings, depressed mood, low energy, poor concentration, and poor appetite. She endorses manic symptoms of decreased need for sleep, distractibility, flight of ideas, and increased activities. She denies symptoms of mania, OCD, and PTSD. She denies all illicit substance use including alcohol and tobacco.  Discussed with patient about treatment options. She knows that she was recently changed to latuda (she has been taking it with adequate calories), but she does not know here previous medication trials. As per chart review she also takes lexapro, vistaril, and buspar. We discussed discontinuing buspar and changing vistaril to a lower dose but available more throughout the day as a PRN for anxiety. We will also change latuda to Risperdal to better address symptoms of psychosis and continue lexapro without changes. Pt was in agreement to the above plan, and she had no further questions, comments, or concerns.   Associated Signs/Symptoms: Depression Symptoms:  depressed mood, anhedonia, insomnia, fatigue, feelings of worthlessness/guilt, difficulty concentrating, anxiety, loss of energy/fatigue, (Hypo) Manic Symptoms:  Delusions, Distractibility, Flight of Ideas, Hallucinations, Impulsivity, Labiality of Mood, Anxiety Symptoms:  Excessive Worry, Psychotic Symptoms:  Delusions, Hallucinations: Auditory Visual Ideas of Reference, Paranoia, PTSD Symptoms: NA Total Time spent with patient: 1 hour  Past Psychiatric History:  -previous dx of bipolar - current outpatient with Dr. Tenny Craw at [facility] in [place] - about 5 previous inpt stays with last about 7 years ago -  hx of suicide attempt x3 via overdose  Is the patient at risk  to self? Yes.    Has the patient been a risk to self in the past 6 months? Yes.    Has the patient been a risk to self within the distant past? Yes.    Is the patient a risk to others? Yes.    Has the patient been a risk to others in the past 6 months? Yes.    Has the patient been a risk to others within the distant past? Yes.     Prior Inpatient Therapy:   Prior Outpatient Therapy:    Alcohol Screening: 1. How often do you have a drink containing alcohol?: Never 2. How many drinks containing alcohol do you have on a typical day when you are drinking?: 1 or 2 3. How often do you have six or more drinks on one occasion?: Never AUDIT-C Score: 0 4. How often during the last year have you found that you were not able to stop drinking once you had started?: Never 5. How often during the last year have you failed to do what was normally expected from you becasue of drinking?: Never 6. How often during the last year have you needed a first drink in the morning to get yourself going after a heavy drinking session?: Never 7. How often during the last year have you had a feeling of guilt of remorse after drinking?: Never 8. How often during the last year have you been unable to remember what happened the night before because you had been drinking?: Never 9. Have you or someone else been injured as a result of your drinking?: No 10. Has a relative or friend or a doctor or another health worker been concerned about your drinking or suggested you cut down?: No Alcohol Use Disorder Identification Test Final Score (AUDIT): 0 Substance Abuse History in the last 12 months:  No. Consequences of Substance Abuse: NA Previous Psychotropic Medications: Yes  Psychological Evaluations: Yes  Past Medical History:  Past Medical History:  Diagnosis Date  . Asthma   . Back pain   . Chest pain   . Diabetes mellitus type II   . Headache   . High cholesterol   . Hypertension   . Obesity, morbid (HCC)   .  Other and unspecified bipolar disorders   . 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 Thyroid disease     Past Surgical History:  Procedure Laterality Date  . TUBAL LIGATION     Bilateral   Family History:  Family History  Problem Relation Age of Onset  . Heart attack Maternal Uncle   . Depression Maternal Uncle   . Depression Mother   . Alcohol abuse Maternal Grandfather   . Alcohol abuse Maternal Uncle   . Depression Maternal Grandmother    Family Psychiatric  History: unknown family psychiatric history. Tobacco Screening:   Social History: Pt was born and raised in the [place] area. She lives alone. She is widowed. She has 2 adult daughters with whom she is close. She is on disability for bipolar disorder. She completed some college. She denies legal and trauma history.  Social History   Substance and Sexual Activity  Alcohol Use No  . Alcohol/week: 0.0 standard drinks     Social History   Substance and Sexual Activity  Drug Use No   Comment: 05/13/2016 per pt no    Additional Social History: Marital status: Widowed Widowed, when?: 3 years ago Are you  sexually active?: No What is your sexual orientation?: heterosexual Does patient have children?: Yes How many children?: 2 How is patient's relationship with their children?: adult daughters and 1 grandson                         Allergies:   Allergies  Allergen Reactions  . Amoxicillin Swelling and Other (See Comments)    Oral swelling   . Doxycycline Nausea And Vomiting  . Levofloxacin Other (See Comments)    headache  . Propoxyphene N-Acetaminophen Nausea And Vomiting   Lab Results:  Results for orders placed or performed during the hospital encounter of 07/04/18 (from the past 48 hour(s))  Glucose, capillary     Status: Abnormal   Collection Time: 07/04/18 12:03 PM  Result Value Ref Range   Glucose-Capillary 416 (H) 70 - 99 mg/dL   Comment 1 Notify RN    Comment 2 Document in Chart     Blood  Alcohol level:  Lab Results  Component Value Date   ETH <10 07/03/2018   ETH <10 07/13/2017    Metabolic Disorder Labs:  Lab Results  Component Value Date   HGBA1C 11.0 (H) 10/14/2014   MPG 269 (H) 10/14/2014   MPG 206 (H) 09/05/2010   No results found for: PROLACTIN Lab Results  Component Value Date   CHOL (H) 06/28/2009    388        ATP III CLASSIFICATION:  <200     mg/dL   Desirable  782-956  mg/dL   Borderline High  >=213    mg/dL   High          TRIG 086 (H) 06/28/2009   HDL 42 06/28/2009   CHOLHDL 9.2 06/28/2009   VLDL UNABLE TO CALCULATE IF TRIGLYCERIDE OVER 400 mg/dL 57/84/6962   LDLCALC  95/28/4132    UNABLE TO CALCULATE IF TRIGLYCERIDE OVER 400 mg/dL        Total Cholesterol/HDL:CHD Risk Coronary Heart Disease Risk Table                     Men   Women  1/2 Average Risk   3.4   3.3  Average Risk       5.0   4.4  2 X Average Risk   9.6   7.1  3 X Average Risk  23.4   11.0        Use the calculated Patient Ratio above and the CHD Risk Table to determine the patient's CHD Risk.        ATP III CLASSIFICATION (LDL):  <100     mg/dL   Optimal  440-102  mg/dL   Near or Above                    Optimal  130-159  mg/dL   Borderline  725-366  mg/dL   High  >440     mg/dL   Very High    Current Medications: Current Facility-Administered Medications  Medication Dose Route Frequency Provider Last Rate Last Dose  . albuterol (PROVENTIL HFA;VENTOLIN HFA) 108 (90 Base) MCG/ACT inhaler 2 puff  2 puff Inhalation Q6H PRN Thermon Leyland, NP      . alum & mag hydroxide-simeth (MAALOX/MYLANTA) 200-200-20 MG/5ML suspension 30 mL  30 mL Oral Q4H PRN Fransisca Kaufmann A, NP      . amLODipine (NORVASC) tablet 10 mg  10 mg Oral Daily Thermon Leyland, NP  10 mg at 07/04/18 1206  . atorvastatin (LIPITOR) tablet 80 mg  80 mg Oral q morning - 10a Fransisca Kaufmann A, NP   80 mg at 07/04/18 1206  . escitalopram (LEXAPRO) tablet 20 mg  20 mg Oral BID Fransisca Kaufmann A, NP   20 mg at 07/04/18  1206  . glipiZIDE (GLUCOTROL) tablet 5 mg  5 mg Oral BID AC Fransisca Kaufmann A, NP      . hydrOXYzine (ATARAX/VISTARIL) tablet 50 mg  50 mg Oral Q6H PRN Fransisca Kaufmann A, NP      . insulin aspart (novoLOG) injection 0-15 Units  0-15 Units Subcutaneous TID WC Micheal Likens, MD      . insulin aspart (novoLOG) injection 0-5 Units  0-5 Units Subcutaneous QHS Fransisca Kaufmann A, NP      . insulin aspart (novoLOG) injection 10 Units  10 Units Subcutaneous TID WC Micheal Likens, MD      . insulin glargine (LANTUS) injection 85 Units  85 Units Subcutaneous BID Micheal Likens, MD      . Melene Muller ON 07/05/2018] levothyroxine (SYNTHROID, LEVOTHROID) tablet 50 mcg  50 mcg Oral QAC breakfast Fransisca Kaufmann A, NP      . magnesium hydroxide (MILK OF MAGNESIA) suspension 30 mL  30 mL Oral Daily PRN Fransisca Kaufmann A, NP      . risperiDONE (RISPERDAL) tablet 1 mg  1 mg Oral BID Micheal Likens, MD      . traZODone (DESYREL) tablet 50 mg  50 mg Oral QHS PRN,MR X 1 Aadvik Roker, Burlene Arnt, MD       PTA Medications: Medications Prior to Admission  Medication Sig Dispense Refill Last Dose  . albuterol (PROVENTIL HFA;VENTOLIN HFA) 108 (90 BASE) MCG/ACT inhaler Inhale 2 puffs into the lungs every 6 (six) hours as needed for wheezing or shortness of breath.    unknown  . amLODipine (NORVASC) 10 MG tablet Take 10 mg by mouth daily.    07/02/2018 at Unknown time  . atorvastatin (LIPITOR) 80 MG tablet Take 80 mg by mouth every morning.    07/02/2018 at Unknown time  . busPIRone (BUSPAR) 30 MG tablet Take 1 tablet (30 mg total) by mouth 3 (three) times daily. (Patient taking differently: Take 30-60 mg by mouth See admin instructions. 30mg  in the morning and 60mg  at bedtime) 270 tablet 2 07/02/2018 at Unknown time  . cetirizine (ZYRTEC) 10 MG tablet Take 10 mg by mouth daily.   07/02/2018 at Unknown time  . escitalopram (LEXAPRO) 20 MG tablet Take 1 tablet (20 mg total) by mouth 2 (two) times daily. 180  tablet 2 07/02/2018 at Unknown time  . fluticasone (FLONASE) 50 MCG/ACT nasal spray Place 1 spray into the nose daily as needed for allergies.    unknown  . glipiZIDE (GLUCOTROL) 5 MG tablet Take 5 mg by mouth 2 (two) times daily before a meal.    07/02/2018 at Unknown time  . hydrochlorothiazide (MICROZIDE) 12.5 MG capsule Take 12.5 mg by mouth daily.   07/02/2018 at Unknown time  . hydrOXYzine (VISTARIL) 100 MG capsule Take 1 capsule (100 mg total) by mouth at bedtime. 30 capsule 2 07/02/2018 at Unknown time  . insulin NPH-regular Human (NOVOLIN 70/30) (70-30) 100 UNIT/ML injection Inject 300 Units into the skin daily with breakfast. *Take as directed per sliding scale instructed   07/02/2018 at Unknown time  . levothyroxine (SYNTHROID, LEVOTHROID) 50 MCG tablet Take 50 mcg by mouth daily before breakfast.    07/02/2018  at Unknown time  . lurasidone (LATUDA) 40 MG TABS tablet Take 1 tablet (40 mg total) by mouth daily after supper. (Patient taking differently: Take 60 mg by mouth daily after supper. ) 90 tablet 2 07/02/2018 at Unknown time  . metoprolol succinate (TOPROL-XL) 50 MG 24 hr tablet Take 50 mg by mouth daily.    07/02/2018 at Unknown time  . montelukast (SINGULAIR) 10 MG tablet Take 10 mg by mouth at bedtime.   07/02/2018 at Unknown time  . Multiple Vitamin (MULTIVITAMIN WITH MINERALS) TABS tablet Take 1 tablet by mouth daily.   07/02/2018 at Unknown time  . topiramate (TOPAMAX) 50 MG tablet Take 50 mg by mouth 3 (three) times daily.    07/02/2018 at Unknown time    Musculoskeletal: Strength & Muscle Tone: within normal limits Gait & Station: normal Patient leans: N/A  Psychiatric Specialty Exam: Physical Exam  Nursing note and vitals reviewed.   Review of Systems  Constitutional: Negative for chills and fever.  Respiratory: Negative for cough and shortness of breath.   Cardiovascular: Negative for chest pain.  Gastrointestinal: Negative for abdominal pain, heartburn, nausea and  vomiting.  Psychiatric/Behavioral: Positive for depression and hallucinations. Negative for suicidal ideas. The patient is nervous/anxious and has insomnia.     Blood pressure (!) 150/80, pulse 86, temperature 99.5 F (37.5 C), temperature source Oral, resp. rate 18, height 5\' 4"  (1.626 m), weight 135.6 kg, SpO2 96 %.Body mass index is 51.32 kg/m.  General Appearance: Casual and Fairly Groomed  Eye Contact:  Good  Speech:  Clear and Coherent and Normal Rate  Volume:  Normal  Mood:  Anxious  Affect:  Appropriate and Congruent  Thought Process:  Coherent and Goal Directed  Orientation:  Full (Time, Place, and Person)  Thought Content:  Hallucinations: Auditory Visual  Suicidal Thoughts:  No  Homicidal Thoughts:  No  Memory:  Immediate;   Fair Recent;   Fair Remote;   Fair  Judgement:  Poor  Insight:  Lacking  Psychomotor Activity:  Normal  Concentration:  Concentration: Good  Recall:  Fair  Fund of Knowledge:  Fair  Language:  Fair  Akathisia:  No  Handed:    AIMS (if indicated):     Assets:  Resilience Social Support  ADL's:  Intact  Cognition:  WNL  Sleep:      Treatment Plan Summary: Daily contact with patient to assess and evaluate symptoms and progress in treatment and Medication management  Observation Level/Precautions:  15 minute checks  Laboratory:  CBC Chemistry Profile HbAIC UDS UA  Psychotherapy:  Encourage participation in groups and therapeutic milieu   Medications:  DC latuda. Start risperdal 1mg  po BID. DC buspar. Change vistaril to 50mg  po q6h prn anxiety. Continue lexapro 20mg  po qDay. Continue all other home med orders and PRN's as currently ordered - see MAR  Consultations:  Diabetic management consult  Discharge Concerns:    Estimated LOS: 5-7 days  Other:     Physician Treatment Plan for Primary Diagnosis: Bipolar I disorder, current or most recent episode manic, with psychotic features with mixed features (HCC) Long Term Goal(s): Improvement  in symptoms so as ready for discharge  Short Term Goals: Ability to identify and develop effective coping behaviors will improve  Physician Treatment Plan for Secondary Diagnosis: Principal Problem:   Bipolar I disorder, current or most recent episode manic, with psychotic features with mixed features (HCC)  Long Term Goal(s): Improvement in symptoms so as ready for discharge  Short Term  Goals: Ability to maintain clinical measurements within normal limits will improve  I certify that inpatient services furnished can reasonably be expected to improve the patient's condition.    Micheal Likens, MD 10/8/20194:49 PM

## 2018-07-04 NOTE — Tx Team (Signed)
Initial Treatment Plan 07/04/2018 1:36 PM [name] FMB:[id number]    PATIENT STRESSORS: Medication change or noncompliance   PATIENT STRENGTHS: Ability for insight Communication skills Motivation for treatment/growth Supportive family/friends   PATIENT IDENTIFIED PROBLEMS: Auditory/Visual hallucinations  Medication management                   DISCHARGE CRITERIA:  Ability to meet basic life and health needs Adequate post-discharge living arrangements Improved stabilization in mood, thinking, and/or behavior  PRELIMINARY DISCHARGE PLAN: Attend aftercare/continuing care group Outpatient therapy  PATIENT/FAMILY INVOLVEMENT: This treatment plan has been presented to and reviewed with the patient, [name]. The patient and family have been given the opportunity to ask questions and make suggestions.  Dewayne Shorter, RN 07/04/2018, 1:36 PM

## 2018-07-04 NOTE — BHH Suicide Risk Assessment (Signed)
[facility] Admission Suicide Risk Assessment   Nursing information obtained from:  Patient Demographic factors:  Age 57 or older, Caucasian, Low socioeconomic status, Living alone, Unemployed Current Mental Status:  NA Loss Factors:  Decline in physical health Historical Factors:  NA Risk Reduction Factors:  Sense of responsibility to family, Positive therapeutic relationship, Positive social support  Total Time spent with patient: 1 hour Principal Problem: Bipolar I disorder, current or most recent episode manic, with psychotic features with mixed features (HCC) Diagnosis:   Patient Active Problem List   Diagnosis Date Noted  . Bipolar I disorder, current or most recent episode manic, with psychotic features with mixed features (HCC) [F31.2] 07/04/2018  . Bipolar disorder, unspecified (HCC) [F31.9] 02/28/2012  . Bipolar 1 disorder, depressed (HCC) [F31.9] 12/27/2011  . OBESITY, MORBID [E66.01] 07/07/2009  . OTHER AND UNSPECIFIED BIPOLAR DISORDERS [F31.89] 07/07/2009  . HYPERTENSION [I10] 07/07/2009  . ASTHMA [J45.909] 07/07/2009  . CHEST PAIN [R07.9] 07/07/2009   Subjective Data: see H&P  Continued Clinical Symptoms:  Alcohol Use Disorder Identification Test Final Score (AUDIT): 0 The "Alcohol Use Disorders Identification Test", Guidelines for Use in Primary Care, Second Edition.  World Science writer [facility]). Score between 0-7:  no or low risk or alcohol related problems. Score between 8-15:  moderate risk of alcohol related problems. Score between 16-19:  high risk of alcohol related problems. Score 20 or above:  warrants further diagnostic evaluation for alcohol dependence and treatment.   Psychiatric Specialty Exam: Physical Exam  Nursing note and vitals reviewed.     Blood pressure (!) 150/80, pulse 86, temperature 99.5 F (37.5 C), temperature source Oral, resp. rate 18, height 5\' 4"  (1.626 m), weight 135.6 kg, SpO2 96 %.Body mass index is 51.32 kg/m.     COGNITIVE  FEATURES THAT CONTRIBUTE TO RISK:  None    SUICIDE RISK:   Minimal: No identifiable suicidal ideation.  Patients presenting with no risk factors but with morbid ruminations; may be classified as minimal risk based on the severity of the depressive symptoms  PLAN OF CARE: see H&P  I certify that inpatient services furnished can reasonably be expected to improve the patient's condition.   , MD 07/04/2018, 5:02 PM

## 2018-07-04 NOTE — Progress Notes (Signed)
Patient presented to [facility] due to auditory and visual hallucinations. Patient stated she used to have a dog who passed away two years ago, and she hears the dog barking and scratching at the door. She also endorsed visual hallucinations of her husband who had passed away three years ago. She said it started at her house, then started happening at her daughter's house, which scared her. Patient denies drug and alcohol use, and UDS was negative. States hallucinations started after her psychiatrist switched up her medications. Support system includes family, mostly her two daughters who she speaks with every day. Patient's goals include stopping the hallucinations as well as "take better care of myself." Patient sees Dr. Tenny Craw at [facility].  Skin search was performed and was found unremarkable. Patient was oriented to the unit. Safety is maintained with 15 minute checks as well as environmetal checks. Will continue to monitor.

## 2018-07-04 NOTE — Progress Notes (Signed)
Recreation Therapy Notes  INPATIENT RECREATION THERAPY ASSESSMENT  Patient Details Name: [name] MRN: [id number] DOB: 07-Sep-1961 Today's Date: 07/04/2018       Information Obtained From: Patient  Able to Participate in Assessment/Interview: Yes  Patient Presentation: Alert  Reason for Admission (Per Patient): Other (Comments)(Hallucinations, depression)  Patient Stressors: Other (Comment)(Hallucinations, scared at night)  Coping Skills:   TV, Music, Talk, Prayer, Avoidance, Read  Leisure Interests (2+):  Social - Family, Games - Therapist, music, Games - Other (Comment)(Bowling)  Frequency of Recreation/Participation: Weekly(Pt stated she bowls monthly.)  Awareness of Community Resources:  Yes  Community Resources:  [employer], Other (Comment)([facility])  Current Use: Yes  If no, Barriers?:    Expressed Interest in [employer] Information: No  [employer] of Residence:  [place]  Patient Main Form of Transportation: Set designer  Patient Strengths:  Good prayer warrior; strong faith  Patient Identified Areas of Improvement:  Self-esteem; Motivation  Patient Goal for Hospitalization:  "Quit having hallucinations and work on self-esteem"  Current SI (including self-harm):  No  Current HI:  No  Current AVH: No  Staff Intervention Plan: Group Attendance, Collaborate with Interdisciplinary Treatment Team  Consent to Intern Participation: N/A    Caroll Rancher, LRT/CTRS  Caroll Rancher A 07/04/2018, 1:37 PM

## 2018-07-04 NOTE — Progress Notes (Addendum)
Inpatient Diabetes Program Recommendations  AACE/ADA: New Consensus Statement on Inpatient Glycemic Control (2015)  Target Ranges:  Prepandial:   less than 140 mg/dL      Peak postprandial:   less than 180 mg/dL (1-2 hours)      Critically ill patients:  140 - 180 mg/dL   Lab Results  Component Value Date   GLUCAP 302 (H) 07/04/2018   HGBA1C 11.0 (H) 10/14/2014    Review of Glycemic Control Results for CORABELLE, SPACKMAN (MRN [id number]) as of 07/04/2018 09:48  Ref. Range 07/03/2018 23:15 07/04/2018 06:44 07/04/2018 08:38  Glucose-Capillary Latest Ref Range: 70 - 99 mg/dL 846 (H) 962 (H) 952 (H)   Diabetes history: Type 2 DM Outpatient Diabetes medications: Novolin 70/30 300 units QAM, Glipizide 5 mg BID Current orders for Inpatient glycemic control: Glipizide 5 mg BID, Lantus 75 units BID, Novolog 0-15 units Q4H  Inpatient Diabetes Program Recommendations:    Last A1C 11.6% as of 03/20/2018, indicating poor glycemic control. At this visit Novolin 70/30 was supposed to increase to 400 units QAM? Noted plan for admit to [facility] once BS begin to decrease <300 mg/dL.   MD consider:   - Changing diet to carb modified while inpatient. - Increasing Lantus 85 units BID. - Adding Novolog 10 units TID (assuming patient is consuming >50% of meal) under the glycemic control order set. - Adding Novolog 0-5 units QHS.  - Changing correction to Novolog 0-15 units TID.   When appropriate, patient will need close outpatient monitoring for DM management and could benefit from a different home regimen for improvement to DM control.   Thanks, Lujean Rave, MSN, RNC-OB Diabetes Coordinator [phone] (8a-5p)

## 2018-07-05 LAB — GLUCOSE, CAPILLARY
GLUCOSE-CAPILLARY: 299 mg/dL — AB (ref 70–99)
GLUCOSE-CAPILLARY: 430 mg/dL — AB (ref 70–99)
GLUCOSE-CAPILLARY: 329 mg/dL — AB (ref 70–99)
GLUCOSE-CAPILLARY: 259 mg/dL — AB (ref 70–99)
Glucose-Capillary: 253 mg/dL — ABNORMAL HIGH (ref 70–99)

## 2018-07-05 MED ORDER — RISPERIDONE 1 MG PO TABS
1.0000 mg | ORAL_TABLET | ORAL | Status: DC
  Administered 2018-07-06 – 2018-07-07 (×2): 1 mg via ORAL
  Filled 2018-07-05 (×4): qty 1

## 2018-07-05 MED ORDER — IBUPROFEN 600 MG PO TABS
600.0000 mg | ORAL_TABLET | Freq: Four times a day (QID) | ORAL | Status: DC | PRN
  Administered 2018-07-05: 600 mg via ORAL
  Filled 2018-07-05: qty 1

## 2018-07-05 MED ORDER — RISPERIDONE 2 MG PO TABS
2.0000 mg | ORAL_TABLET | Freq: Every day | ORAL | Status: DC
  Administered 2018-07-05 – 2018-07-06 (×2): 2 mg via ORAL
  Filled 2018-07-05 (×4): qty 1

## 2018-07-05 MED ORDER — GLIPIZIDE 10 MG PO TABS
10.0000 mg | ORAL_TABLET | Freq: Two times a day (BID) | ORAL | Status: DC
  Administered 2018-07-06 – 2018-07-07 (×4): 10 mg via ORAL
  Filled 2018-07-05 (×8): qty 1

## 2018-07-05 NOTE — Progress Notes (Signed)
MD Clary notified of patients CBG of 430mg /dl.  Verbal order given to recheck CBG in 1 hour and increase Glipizide to 10mg  starting tomorrow.

## 2018-07-05 NOTE — Progress Notes (Signed)
Pt was observed in the dayroom, seen eating a snack. No interaction with peers.Pt attended wrap-up group. Pt appears depressed/anxious in affect and mood. Pt denies SI/HI at this time. Rates pain 10/10; back. Pt denies AVH. Pt c/o of insomnia and anxiety this evening. PRN vistaril and trazodone requested and given. CBG was 259 at bedtime. Support and encouragement provided.Pleasant in the milieu. Will continue with POC.

## 2018-07-05 NOTE — Progress Notes (Signed)
D: Patient denies SI, HI or AVH today. Pt. Presents as flat and depressed but pleasant and cooperative.  Pt. States she did sleep well last night with the help of medication.  Pt. States that her appetite is good and only physical complaint is of some chronic lower back pain r/t arthritis.  Per patients self inventory, her concentration is poor.  She rates her depression 5/10, anxiety 8/10 and hopelessness is 2/10.  Pt. States her goal for today is to work on her self esteem, she is visualized in the dayroom with some peer interaction.  Pt. Did attend/participate in group.  A: Patient given emotional support from RN. Patient encouraged to come to staff with concerns and/or questions. Patient's medication routine continued. Patient's orders and plan of care reviewed.   R: Patient remains appropriate and cooperative. Will continue to monitor patient q15 minutes for safety.

## 2018-07-05 NOTE — Progress Notes (Signed)
Recreation Therapy Notes  Date: 10.9.19 Time: 1000 Location: 500 Hall Dayroom  Group Topic: Self-Esteem  Goal Area(s) Addresses:  Patient will successfully identify positive attributes about themselves.  Patient will successfully identify benefit of improved self-esteem.   Behavioral Response: Engaged  Intervention: Scientist, clinical (histocompatibility and immunogenetics), scissors, glue sticks, magazines, music  Activity: Collage.  Patients were to use the supplies provided to create a collage that highlighted the good things about them.  Patients could also focus on accomplishments or places they want go or activities they want to try in the future.  Education:  Self-Esteem, Building control surveyor.   Education Outcome: Acknowledges education/In group clarification offered/Needs additional education  Clinical Observations/Feedback: Pt used a picture of [place] the [place] because she stated that's what she wanted, a dog that reminded her of her dog "Gizmo" that passed away and a plus sized model because "I like seeing them instead of those stick thin models all the time".    Caroll Rancher, LRT/CTRS     Caroll Rancher A 07/05/2018 11:46 AM

## 2018-07-05 NOTE — Progress Notes (Signed)
[facility] MD Progress Note  07/05/2018 9:01 AM [name]  MRN:  [id number] Subjective:    [name] is a 57 y/o F with history of bipolar I who was admitted voluntarily from [facility] ED where she presented brought in by her daughter at recommendation of her outpatient provider due to worsening symptoms of mania and psychosis including disorganized thoughts, delusions, paranoia, AH, and VH. Pt has also been having medication changes recently as an outpatient. She was medically cleared and then transferred to [facility] for additional treatment and stabilization.  Today upon evaluation, pt shares, "I'm doing pretty good - my back is just hurting." She denies any specific concerns aside from some chronic low back pain. She is sleeping well. Her appetite is good. She denies other physical complaints. She denies SI/HI/AH/VH. She denies symptoms of paranoia. She is tolerating her medications well, and she denies side effects. We discussed option of increasing evening dose of risperdal, and she was in agreement. She had no further questions, comments, or concerns.   Principal Problem: Bipolar I disorder, current or most recent episode manic, with psychotic features with mixed features [facility]) Diagnosis:   Patient Active Problem List   Diagnosis Date Noted  . Bipolar I disorder, current or most recent episode manic, with psychotic features with mixed features (HCC) [F31.2] 07/04/2018  . Bipolar disorder, unspecified (HCC) [F31.9] 02/28/2012  . Bipolar 1 disorder, depressed (HCC) [F31.9] 12/27/2011  . OBESITY, MORBID [E66.01] 07/07/2009  . OTHER AND UNSPECIFIED BIPOLAR DISORDERS [F31.89] 07/07/2009  . HYPERTENSION [I10] 07/07/2009  . ASTHMA [J45.909] 07/07/2009  . CHEST PAIN [R07.9] 07/07/2009   Total Time spent with patient: 30 minutes  Past Psychiatric History: see H&P  Past Medical History:  Past Medical History:  Diagnosis Date  . Asthma   . Back pain   . Chest pain   . Diabetes mellitus  type II   . Headache   . High cholesterol   . Hypertension   . Obesity, morbid (HCC)   . Other and unspecified bipolar disorders   . 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 Thyroid disease     Past Surgical History:  Procedure Laterality Date  . TUBAL LIGATION     Bilateral   Family History:  Family History  Problem Relation Age of Onset  . Heart attack Maternal Uncle   . Depression Maternal Uncle   . Depression Mother   . Alcohol abuse Maternal Grandfather   . Alcohol abuse Maternal Uncle   . Depression Maternal Grandmother    Family Psychiatric  History: see H&P Social History:  Social History   Substance and Sexual Activity  Alcohol Use No  . Alcohol/week: 0.0 standard drinks     Social History   Substance and Sexual Activity  Drug Use No   Comment: 05/13/2016 per pt no    Social History   Socioeconomic History  . Marital status: Widowed    Spouse name: Not on file  . Number of children: Not on file  . Years of education: Not on file  . Highest education level: Not on file  Occupational History  . Occupation: Disabled    Associate Professor: UNEMPLOYED  Social Needs  . Financial resource strain: Not on file  . Food insecurity:    Worry: Not on file    Inability: Not on file  . Transportation needs:    Medical: Not on file    Non-medical: Not on file  Tobacco Use  . Smoking status: Never Smoker  . Smokeless  tobacco: Never Used  Substance and Sexual Activity  . Alcohol use: No    Alcohol/week: 0.0 standard drinks  . Drug use: No    Comment: 05/13/2016 per pt no  . Sexual activity: Yes    Birth control/protection: Surgical  Lifestyle  . Physical activity:    Days per week: Not on file    Minutes per session: Not on file  . Stress: Not on file  Relationships  . Social connections:    Talks on phone: Not on file    Gets together: Not on file    Attends religious service: Not on file    Active member of club or organization: Not on file    Attends meetings of  clubs or organizations: Not on file    Relationship status: Not on file  Other Topics Concern  . Not on file  Social History Narrative   Married   No regular exercise   Additional Social History:                         Sleep: Good  Appetite:  Good  Current Medications: Current Facility-Administered Medications  Medication Dose Route Frequency Provider Last Rate Last Dose  . albuterol (PROVENTIL HFA;VENTOLIN HFA) 108 (90 Base) MCG/ACT inhaler 2 puff  2 puff Inhalation Q6H PRN Thermon Leyland, NP      . alum & mag hydroxide-simeth (MAALOX/MYLANTA) 200-200-20 MG/5ML suspension 30 mL  30 mL Oral Q4H PRN Fransisca Kaufmann A, NP      . amLODipine (NORVASC) tablet 10 mg  10 mg Oral Daily Fransisca Kaufmann A, NP   10 mg at 07/05/18 0817  . atorvastatin (LIPITOR) tablet 80 mg  80 mg Oral q morning - 10a Fransisca Kaufmann A, NP   80 mg at 07/04/18 1206  . escitalopram (LEXAPRO) tablet 20 mg  20 mg Oral BID Fransisca Kaufmann A, NP   20 mg at 07/05/18 0817  . glipiZIDE (GLUCOTROL) tablet 5 mg  5 mg Oral BID AC Fransisca Kaufmann A, NP   5 mg at 07/05/18 0631  . hydrOXYzine (ATARAX/VISTARIL) tablet 50 mg  50 mg Oral Q6H PRN Thermon Leyland, NP   50 mg at 07/04/18 2111  . insulin aspart (novoLOG) injection 0-15 Units  0-15 Units Subcutaneous TID WC Micheal Likens, MD   8 Units at 07/05/18 0630  . insulin aspart (novoLOG) injection 0-5 Units  0-5 Units Subcutaneous QHS Thermon Leyland, NP   4 Units at 07/04/18 2128  . insulin aspart (novoLOG) injection 10 Units  10 Units Subcutaneous TID WC Micheal Likens, MD   10 Units at 07/05/18 [phone]  . insulin glargine (LANTUS) injection 85 Units  85 Units Subcutaneous BID Micheal Likens, MD   85 Units at 07/05/18 0815  . levothyroxine (SYNTHROID, LEVOTHROID) tablet 50 mcg  50 mcg Oral QAC breakfast Fransisca Kaufmann A, NP   50 mcg at 07/05/18 0631  . magnesium hydroxide (MILK OF MAGNESIA) suspension 30 mL  30 mL Oral Daily PRN Fransisca Kaufmann A, NP      .  risperiDONE (RISPERDAL) tablet 1 mg  1 mg Oral BID Micheal Likens, MD   1 mg at 07/05/18 0817  . traZODone (DESYREL) tablet 50 mg  50 mg Oral QHS PRN,MR X 1 Micheal Likens, MD   50 mg at 07/04/18 2223    Lab Results:  Results for orders placed or performed during the hospital encounter of 07/04/18 (from  the past 48 hour(s))  Glucose, capillary     Status: Abnormal   Collection Time: 07/04/18 12:03 PM  Result Value Ref Range   Glucose-Capillary 416 (H) 70 - 99 mg/dL   Comment 1 Notify RN    Comment 2 Document in Chart   Glucose, capillary     Status: Abnormal   Collection Time: 07/04/18  5:10 PM  Result Value Ref Range   Glucose-Capillary 321 (H) 70 - 99 mg/dL   Comment 1 Notify RN    Comment 2 Document in Chart   Glucose, capillary     Status: Abnormal   Collection Time: 07/04/18  7:57 PM  Result Value Ref Range   Glucose-Capillary 318 (H) 70 - 99 mg/dL  Glucose, capillary     Status: Abnormal   Collection Time: 07/05/18  6:26 AM  Result Value Ref Range   Glucose-Capillary 253 (H) 70 - 99 mg/dL    Blood Alcohol level:  Lab Results  Component Value Date   ETH <10 07/03/2018   ETH <10 07/13/2017    Metabolic Disorder Labs: Lab Results  Component Value Date   HGBA1C 11.0 (H) 10/14/2014   MPG 269 (H) 10/14/2014   MPG 206 (H) 09/05/2010   No results found for: PROLACTIN Lab Results  Component Value Date   CHOL (H) 06/28/2009    388        ATP III CLASSIFICATION:  <200     mg/dL   Desirable  951-884  mg/dL   Borderline High  >=166    mg/dL   High          TRIG 063 (H) 06/28/2009   HDL 42 06/28/2009   CHOLHDL 9.2 06/28/2009   VLDL UNABLE TO CALCULATE IF TRIGLYCERIDE OVER 400 mg/dL 01/60/1093   LDLCALC  23/55/7322    UNABLE TO CALCULATE IF TRIGLYCERIDE OVER 400 mg/dL        Total Cholesterol/HDL:CHD Risk Coronary Heart Disease Risk Table                     Men   Women  1/2 Average Risk   3.4   3.3  Average Risk       5.0   4.4  2 X Average  Risk   9.6   7.1  3 X Average Risk  23.4   11.0        Use the calculated Patient Ratio above and the CHD Risk Table to determine the patient's CHD Risk.        ATP III CLASSIFICATION (LDL):  <100     mg/dL   Optimal  025-427  mg/dL   Near or Above                    Optimal  130-159  mg/dL   Borderline  062-376  mg/dL   High  >283     mg/dL   Very High    Physical Findings: AIMS: Facial and Oral Movements Muscles of Facial Expression: None, normal Lips and Perioral Area: None, normal Jaw: None, normal Tongue: None, normal,Extremity Movements Upper (arms, wrists, hands, fingers): None, normal Lower (legs, knees, ankles, toes): None, normal, Trunk Movements Neck, shoulders, hips: None, normal, Overall Severity Severity of abnormal movements (highest score from questions above): None, normal Incapacitation due to abnormal movements: None, normal Patient's awareness of abnormal movements (rate only patient's report): No Awareness, Dental Status Current problems with teeth and/or dentures?: No Does patient usually wear dentures?: No  CIWA:  CIWA-Ar Total: 0 COWS:  COWS Total Score: 1  Musculoskeletal: Strength & Muscle Tone: within normal limits Gait & Station: normal Patient leans: N/A  Psychiatric Specialty Exam: Physical Exam  Nursing note and vitals reviewed.   Review of Systems  Constitutional: Negative for chills and fever.  Respiratory: Negative for cough and shortness of breath.   Cardiovascular: Negative for chest pain.  Gastrointestinal: Negative for abdominal pain, heartburn, nausea and vomiting.  Psychiatric/Behavioral: Negative for depression, hallucinations and suicidal ideas. The patient is not nervous/anxious and does not have insomnia.     Blood pressure 120/83, pulse 80, temperature 99.5 F (37.5 C), temperature source Oral, resp. rate 18, height 5\' 4"  (1.626 m), weight 135.6 kg, SpO2 96 %.Body mass index is 51.32 kg/m.  General Appearance: Casual and  Fairly Groomed  Eye Contact:  Good  Speech:  Clear and Coherent and Normal Rate  Volume:  Normal  Mood:  Euthymic  Affect:  Appropriate and Congruent  Thought Process:  Coherent and Goal Directed  Orientation:  Full (Time, Place, and Person)  Thought Content:  Logical  Suicidal Thoughts:  No  Homicidal Thoughts:  No  Memory:  Immediate;   Fair Recent;   Fair Remote;   Fair  Judgement:  Fair  Insight:  Fair  Psychomotor Activity:  Normal  Concentration:  Concentration: Good  Recall:  Fair  Fund of Knowledge:  Fair  Language:  Fair  Akathisia:  No  Handed:    AIMS (if indicated):     Assets:  Communication Skills Resilience Social Support  ADL's:  Intact  Cognition:  WNL  Sleep:  Number of Hours: 6.75     Treatment Plan Summary: Daily contact with patient to assess and evaluate symptoms and progress in treatment and Medication management  -Continue inpatient hospitalization  -Bipolar I, current episode manic with mixed features and psychosis   -Continue lexapro 20mg  po qDay  -Change risperdal 1mg  po BID to risperdal 1mg  po qAM + 2mg  po qhs  -insomnia  -continue trazodone 50mg  po qhs prn insomnia  -hypothyroidism   -Continue synthroid po qAM with breakfast  -DMII   -Continue current insulin protocol orders:        -Continue novolog 0-15 units [place] TID with meals as per sliding scale        -Continue novolog 0-5 units [place] qhs as per sliding scale        -Continue novolog 10 units [place] TID with meals        -Continue lantus 85 units [place] BID   -Continue glipixide 5mg  po BID with meals  -HTN  -Continue norvasc 10mg  po qDay  -Asthma   -Continue albuterol 108 mcg/act take 2 puffs q6h prn SOB/wheeze  -HLD  -Continue atorvastatin 80mg  po qDay  -anxiety   -Continue vistaril 50mg  po q6h prn anxiety  -Encourage participation in groups and therapeutic milieu  -disposition planning will be ongoing  Micheal Likens, MD 07/05/2018, 9:01 AM

## 2018-07-05 NOTE — Tx Team (Signed)
Interdisciplinary Treatment and Diagnostic Plan Update  07/05/2018 Time of Session: 9:12 AM  [name] MRN: [id number]  Principal Diagnosis: Bipolar I disorder, current or most recent episode manic, with psychotic features with mixed features ([place])  Secondary Diagnoses: Principal Problem:   Bipolar I disorder, current or most recent episode manic, with psychotic features with mixed features ([place])   Current Medications:  Current Facility-Administered Medications  Medication Dose Route Frequency Provider Last Rate Last Dose  . albuterol (PROVENTIL HFA;VENTOLIN HFA) 108 (90 Base) MCG/ACT inhaler 2 puff  2 puff Inhalation Q6H PRN Niel Hummer, NP      . alum & mag hydroxide-simeth (MAALOX/MYLANTA) 200-200-20 MG/5ML suspension 30 mL  30 mL Oral Q4H PRN Elmarie Shiley A, NP      . amLODipine (NORVASC) tablet 10 mg  10 mg Oral Daily Elmarie Shiley A, NP   10 mg at 07/05/18 0817  . atorvastatin (LIPITOR) tablet 80 mg  80 mg Oral q morning - 10a Elmarie Shiley A, NP   80 mg at 07/04/18 1206  . escitalopram (LEXAPRO) tablet 20 mg  20 mg Oral BID Elmarie Shiley A, NP   20 mg at 07/05/18 0817  . glipiZIDE (GLUCOTROL) tablet 5 mg  5 mg Oral BID AC Elmarie Shiley A, NP   5 mg at 07/05/18 0631  . hydrOXYzine (ATARAX/VISTARIL) tablet 50 mg  50 mg Oral Q6H PRN Niel Hummer, NP   50 mg at 07/04/18 2111  . insulin aspart (novoLOG) injection 0-15 Units  0-15 Units Subcutaneous TID WC Pennelope Bracken, MD   8 Units at 07/05/18 0630  . insulin aspart (novoLOG) injection 0-5 Units  0-5 Units Subcutaneous QHS Niel Hummer, NP   4 Units at 07/04/18 2128  . insulin aspart (novoLOG) injection 10 Units  10 Units Subcutaneous TID WC Pennelope Bracken, MD   10 Units at 07/05/18 [phone]  . insulin glargine (LANTUS) injection 85 Units  85 Units Subcutaneous BID Pennelope Bracken, MD   85 Units at 07/05/18 0815  . levothyroxine (SYNTHROID, LEVOTHROID) tablet 50 mcg  50 mcg Oral QAC breakfast Elmarie Shiley A, NP   50 mcg at 07/05/18 0631  . magnesium hydroxide (MILK OF MAGNESIA) suspension 30 mL  30 mL Oral Daily PRN Elmarie Shiley A, NP      . risperiDONE (RISPERDAL) tablet 1 mg  1 mg Oral BID Pennelope Bracken, MD   1 mg at 07/05/18 0817  . traZODone (DESYREL) tablet 50 mg  50 mg Oral QHS PRN,MR X 1 Pennelope Bracken, MD   50 mg at 07/04/18 2223    PTA Medications: Medications Prior to Admission  Medication Sig Dispense Refill Last Dose  . albuterol (PROVENTIL HFA;VENTOLIN HFA) 108 (90 BASE) MCG/ACT inhaler Inhale 2 puffs into the lungs every 6 (six) hours as needed for wheezing or shortness of breath.    unknown  . amLODipine (NORVASC) 10 MG tablet Take 10 mg by mouth daily.    07/02/2018 at Unknown time  . atorvastatin (LIPITOR) 80 MG tablet Take 80 mg by mouth every morning.    07/02/2018 at Unknown time  . busPIRone (BUSPAR) 30 MG tablet Take 1 tablet (30 mg total) by mouth 3 (three) times daily. (Patient taking differently: Take 30-60 mg by mouth See admin instructions. 70m in the morning and 645mat bedtime) 270 tablet 2 07/02/2018 at Unknown time  . cetirizine (ZYRTEC) 10 MG tablet Take 10 mg by mouth daily.   07/02/2018  at Unknown time  . escitalopram (LEXAPRO) 20 MG tablet Take 1 tablet (20 mg total) by mouth 2 (two) times daily. 180 tablet 2 07/02/2018 at Unknown time  . fluticasone (FLONASE) 50 MCG/ACT nasal spray Place 1 spray into the nose daily as needed for allergies.    unknown  . glipiZIDE (GLUCOTROL) 5 MG tablet Take 5 mg by mouth 2 (two) times daily before a meal.    07/02/2018 at Unknown time  . hydrochlorothiazide (MICROZIDE) 12.5 MG capsule Take 12.5 mg by mouth daily.   07/02/2018 at Unknown time  . hydrOXYzine (VISTARIL) 100 MG capsule Take 1 capsule (100 mg total) by mouth at bedtime. 30 capsule 2 07/02/2018 at Unknown time  . insulin NPH-regular Human (NOVOLIN 70/30) (70-30) 100 UNIT/ML injection Inject 300 Units into the skin daily with breakfast. *Take as  directed per sliding scale instructed   07/02/2018 at Unknown time  . levothyroxine (SYNTHROID, LEVOTHROID) 50 MCG tablet Take 50 mcg by mouth daily before breakfast.    07/02/2018 at Unknown time  . lurasidone (LATUDA) 40 MG TABS tablet Take 1 tablet (40 mg total) by mouth daily after supper. (Patient taking differently: Take 60 mg by mouth daily after supper. ) 90 tablet 2 07/02/2018 at Unknown time  . metoprolol succinate (TOPROL-XL) 50 MG 24 hr tablet Take 50 mg by mouth daily.    07/02/2018 at Unknown time  . montelukast (SINGULAIR) 10 MG tablet Take 10 mg by mouth at bedtime.   07/02/2018 at Unknown time  . Multiple Vitamin (MULTIVITAMIN WITH MINERALS) TABS tablet Take 1 tablet by mouth daily.   07/02/2018 at Unknown time  . topiramate (TOPAMAX) 50 MG tablet Take 50 mg by mouth 3 (three) times daily.    07/02/2018 at Unknown time    Patient Stressors: Medication change or noncompliance  Patient Strengths: Ability for insight Communication skills Motivation for treatment/growth Supportive family/friends  Treatment Modalities: Medication Management, Group therapy, Case management,  1 to 1 session with clinician, Psychoeducation, Recreational therapy.   Physician Treatment Plan for Primary Diagnosis: Bipolar I disorder, current or most recent episode manic, with psychotic features with mixed features ([place]) Long Term Goal(s): Improvement in symptoms so as ready for discharge  Short Term Goals: Ability to identify and develop effective coping behaviors will improve Ability to maintain clinical measurements within normal limits will improve  Medication Management: Evaluate patient's response, side effects, and tolerance of medication regimen.  Therapeutic Interventions: 1 to 1 sessions, Unit Group sessions and Medication administration.  Evaluation of Outcomes: Progressing  Physician Treatment Plan for Secondary Diagnosis: Principal Problem:   Bipolar I disorder, current or most recent  episode manic, with psychotic features with mixed features ([place])   Long Term Goal(s): Improvement in symptoms so as ready for discharge  Short Term Goals: Ability to identify and develop effective coping behaviors will improve Ability to maintain clinical measurements within normal limits will improve  Medication Management: Evaluate patient's response, side effects, and tolerance of medication regimen.  Therapeutic Interventions: 1 to 1 sessions, Unit Group sessions and Medication administration.  Evaluation of Outcomes: Progressing   RN Treatment Plan for Primary Diagnosis: Bipolar I disorder, current or most recent episode manic, with psychotic features with mixed features ([place]) Long Term Goal(s): Knowledge of disease and therapeutic regimen to maintain health will improve  Short Term Goals: Ability to identify and develop effective coping behaviors will improve and Compliance with prescribed medications will improve  Medication Management: RN will administer medications as ordered by provider,  will assess and evaluate patient's response and provide education to patient for prescribed medication. RN will report any adverse and/or side effects to prescribing provider.  Therapeutic Interventions: 1 on 1 counseling sessions, Psychoeducation, Medication administration, Evaluate responses to treatment, Monitor vital signs and CBGs as ordered, Perform/monitor CIWA, COWS, AIMS and Fall Risk screenings as ordered, Perform wound care treatments as ordered.  Evaluation of Outcomes: Progressing   LCSW Treatment Plan for Primary Diagnosis: Bipolar I disorder, current or most recent episode manic, with psychotic features with mixed features ([place]) Long Term Goal(s): Safe transition to appropriate next level of care at discharge, Engage patient in therapeutic group addressing interpersonal concerns.  Short Term Goals: Engage patient in aftercare planning with referrals and resources  Therapeutic  Interventions: Assess for all discharge needs, 1 to 1 time with Social worker, Explore available resources and support systems, Assess for adequacy in community support network, Educate family and significant other(s) on suicide prevention, Complete Psychosocial Assessment, Interpersonal group therapy.  Evaluation of Outcomes: Met  Return home, follow up current providers   Progress in Treatment: Attending groups: Yes Participating in groups: Yes Taking medication as prescribed: Yes Toleration medication: Yes, no side effects reported at this time Family/Significant other contact made: No Patient understands diagnosis: Yes AEB asking for hekp with hallucinations Discussing patient identified problems/goals with staff: Yes Medical problems stabilized or resolved: Yes Denies suicidal/homicidal ideation: Yes Issues/concerns per patient self-inventory: None Other: N/A  New problem(s) identified: None identified at this time.   New Short Term/Long Term Goal(s): "I want to work on my self esteem and stop the hallucinations."   Discharge Plan or Barriers:   Reason for Continuation of Hospitalization:  Depression Hallucinations  Medication stabilization   Estimated Length of Stay: 10/14  Attendees: Patient: [name] 07/05/2018  9:12 AM  Physician: Maris Berger, MD 07/05/2018  9:12 AM  Nursing: Elesa Massed, RN 07/05/2018  9:12 AM  RN Care Manager: Lars Pinks, RN 07/05/2018  9:12 AM  Social Worker: Ripley Fraise 07/05/2018  9:12 AM  Recreational Therapist: Winfield Cunas 07/05/2018  9:12 AM  Other: Norberto Sorenson 07/05/2018  9:12 AM  Other:  07/05/2018  9:12 AM    Scribe for Treatment Team:  Roque Lias LCSW 07/05/2018 9:12 AM

## 2018-07-05 NOTE — [facility] Group Notes (Signed)
LCSW Group Therapy Note  07/05/2018 1:15pm  Type of Therapy/Topic:  Group Therapy:  Emotion Regulation  Participation Level:  Did Not Attend    [name], LCSW 07/05/2018 10:18 AM

## 2018-07-05 NOTE — Progress Notes (Signed)
Patient verbalized that she had a good day since she talked to her doctor about her treatment plan along with her discharge plans. She also verbalized that she is on new medication, but that her hallucinations are less frequent. Her goal for tomorrow is to get more rest.

## 2018-07-06 LAB — GLUCOSE, CAPILLARY
GLUCOSE-CAPILLARY: 206 mg/dL — AB (ref 70–99)
Glucose-Capillary: 188 mg/dL — ABNORMAL HIGH (ref 70–99)
Glucose-Capillary: 161 mg/dL — ABNORMAL HIGH (ref 70–99)
Glucose-Capillary: 211 mg/dL — ABNORMAL HIGH (ref 70–99)

## 2018-07-06 NOTE — [facility] Counselor (Signed)
Adult Comprehensive Assessment  Patient ID: [name], female   DOB: [birth year], 57 y.o.   MRN: [id number]  Information Source: Information source: Patient  Current Stressors:  Patient states their primary concerns and needs for treatment are:: "I started hallucinating 2 weeks ago, and then my Dr switched my meds, but it wasn't working." Patient states their goals for this hospitilization and ongoing recovery are:: Stop hallucinating with the help of a medication adjustment Employment / Job issues: Disability Family Relationships: Sports administrator / Lack of resources (include bankruptcy): Fixed income Substance abuse: denies Bereavement / Loss: Husband died 3 years ago, as did dog. Lonely  Has not gotten involved in activites outside of the home except church  Living/Environment/Situation:  Living Arrangements: Alone Living conditions (as described by patient or guardian): good Who else lives in the home?: no one How long has patient lived in current situation?: many years What is atmosphere in current home: Comfortable  Family History:  Marital status: Widowed Widowed, when?: 3 years ago Are you sexually active?: No What is your sexual orientation?: heterosexual Does patient have children?: Yes How many children?: 2 How is patient's relationship with their children?: adult daughters and 1 grandson  Childhood History:  By whom was/is the patient raised?: Both parents Patient's description of current relationship with people who raised him/her: deceased Does patient have siblings?: No Did patient suffer any verbal/emotional/physical/sexual abuse as a child?: No Did patient suffer from severe childhood neglect?: No Has patient ever been sexually abused/assaulted/raped as an adolescent or adult?: No Was the patient ever a victim of a crime or a disaster?: No  Education:  Highest grade of school patient has completed: 2 years of college Currently a student?: No Learning  disability?: No  Employment/Work Situation:   Employment situation: On disability Why is patient on disability: mental health and medical How long has patient been on disability: several years Patient's job has been impacted by current illness: No Did You Receive Any Psychiatric Treatment/Services While in the Military?: No Are There Guns or Other Weapons in Your Home?: No  Financial Resources:   Surveyor, quantity resources: Writer, Medicare Does patient have a Lawyer or guardian?: No  Alcohol/Substance Abuse:   What has been your use of drugs/alcohol within the last 12 months?: denies Alcohol/Substance Abuse Treatment Hx: Denies past history Has alcohol/substance abuse ever caused legal problems?: No  Social Support System:   Conservation officer, nature Support System: Good Describe Community Support System: church family, daughters Type of faith/religion: [employer]ri[employer] Knuckles How does patient's faith help to cope with current illness?: "My faith is something I lean on to help me in tough times-I trust."  Leisure/Recreation:   Leisure and Hobbies: bowling, play bingo  Strengths/Needs:   What is the patient's perception of their strengths?: My faith Patient states these barriers may affect/interfere with their treatment: none Patient states these barriers may affect their return to the community: worried that the hallucinations might reoccur-"Maybe I should stay with my daughter for awhile." Other important information patient would like considered in planning for their treatment: none  Discharge Plan:   Currently receiving community mental health services: Yes (From Whom)([facility] in [place]) Patient states concerns and preferences for aftercare planning are: same Patient states they will know when they are safe and ready for discharge when: "I am feeling better already" Does patient have access to transportation?: Yes Does patient have financial barriers related to discharge  medications?: Yes Will patient be returning to same living situation after discharge?:  Yes  Summary/Recommendations:   Summary and Recommendations (to be completed by the evaluator): [name] is a 57 YO Caucasian female diagnosed with with Bipolar D/O with psychosis.  she presents c/o voices.  At d/c, she will return home and follow up with current providers.  While here, she can benefit from crises stabilization, medication management, therapeutic milieu and referral for services.  [name]. 07/06/2018

## 2018-07-06 NOTE — Progress Notes (Signed)
Adult Psychoeducational Group Note  Date:  07/06/2018 Time:  8:31 PM  Group Topic/Focus:  Wrap-Up Group:   The focus of this group is to help patients review their daily goal of treatment and discuss progress on daily workbooks.  Participation Level:  Did Not Attend  Participation Quality:  Did not attend  Affect:  Did not attend  Cognitive:  Did not attend  Insight: None  Engagement in Group:  Did not attend  Modes of Intervention:  Did not attend  Additional Comments:  Patient did not attend wrap up group tonight.   [name] 07/06/2018, 8:31 PM

## 2018-07-06 NOTE — Progress Notes (Signed)
Recreation Therapy Notes  Date: 10.10.19 Time: 1000 Location: 500 Hall Dayroom  Group Topic: Coping Skills  Goal Area(s) Addresses:  Patient will identify positive coping skills. Patient will identify benefits of coping skills.  Intervention: Worksheet  Activity: Coping Skills.  Patients were given a worksheet to identify instances in which coping skills would be needed.  Patients were to then come up with coping skills to deal with that situation.  Education: Coping Skills, Discharge Planning.   Education Outcome: Acknowledges understanding/In group clarification offered/Needs additional education.   Clinical Observations/Feedback: Pt did not attend group.    Hermen Mario, LRT/CTRS         Linette Gunderson A 07/06/2018 10:49 AM 

## 2018-07-06 NOTE — Plan of Care (Signed)
Progress Note  D: pt found in bed; compliant with medication administration. Pt states she slept fair. Pt rates her depression/hopelessness/anxiety a 2/5/5 out of 10 respectively. Pt has complaints of dizziness and a headache. Pt complains of back pain that she rates at an 8/10.  Pt states her goal for today is to not have any hallucinations and she will achieve this by trying to keep her mind clear. Pt denies any si/hi/ah/vh and verbally agrees to approach staff if these become apparent or before harming himself while at [facility]. A: pt provided support and encouragement. Pt given medications per protocol and standing orders. Q86m safety checks implemented and continued.  R: pt safe on the unit. Will continue to monitor.   Pt progressing in the following metrics  Problem: Education: Goal: Knowledge of [place] [facility] Education information/materials will improve Outcome: Progressing Goal: Emotional status will improve Outcome: Progressing Goal: Verbalization of understanding the information provided will improve Outcome: Progressing   Problem: Activity: Goal: Sleeping patterns will improve Outcome: Progressing   Problem: Coping: Goal: Ability to verbalize frustrations and anger appropriately will improve Outcome: Progressing Goal: Ability to demonstrate self-control will improve Outcome: Progressing

## 2018-07-06 NOTE — [facility] Group Notes (Signed)
[facility] Group Notes:  (Nursing/MHT/Case Management/Adjunct)  Date:  07/06/2018  Time:  4:00 PM  Type of Therapy:  Nurse Education  Participation Level:  Did Not Attend  Dimitriy Carreras R Eashan Schipani 07/06/2018, 4:32 PM 

## 2018-07-06 NOTE — Progress Notes (Signed)
Inpatient Diabetes Program Recommendations  AACE/ADA: New Consensus Statement on Inpatient Glycemic Control (2015)  Target Ranges:  Prepandial:   less than 140 mg/dL      Peak postprandial:   less than 180 mg/dL (1-2 hours)      Critically ill patients:  140 - 180 mg/dL   Lab Results  Component Value Date   GLUCAP 188 (H) 07/06/2018   HGBA1C 11.0 (H) 10/14/2014    Review of Glycemic Control  FBS look good. Post-prandials elevated. Needs increase in Novolog.  Inpatient Diabetes Program Recommendations:     Increase Novolog to 14 units tidwc for meal coverage insulin.  Continue to follow.  Thank you. Ailene Ards, RD, LDN, CDE Inpatient Diabetes Coordinator [phone]

## 2018-07-06 NOTE — [facility] Suicide Risk Assessment (Signed)
[facility] INPATIENT:  Family/Significant Other Suicide Prevention Education  Suicide Prevention Education:  Education Completed; No one has been identified by the patient as the family member/significant other with whom the patient will be residing, and identified as the person(s) who will aid the patient in the event of a mental health crisis (suicidal ideations/suicide attempt).  With written consent from the patient, the family member/significant other has been provided the following suicide prevention education, prior to the and/or following the discharge of the patient.  The suicide prevention education provided includes the following:  Suicide risk factors  Suicide prevention and interventions  National Suicide Hotline telephone number  [facility] assessment telephone number  [facility] Emergency Assistance 911  [facility] and/or Residential Mobile Crisis Unit telephone number  Request made of family/significant other to:  Remove weapons (e.g., guns, rifles, knives), all items previously/currently identified as safety concern.    Remove drugs/medications (over-the-counter, prescriptions, illicit drugs), all items previously/currently identified as a safety concern.  The family member/significant other verbalizes understanding of the suicide prevention education information provided.  The family member/significant other agrees to remove the items of safety concern listed above. The patient did not endorse SI at the time of admission, nor did the patient c/o SI during the stay here.  SPE not required. However, I did call daughter, Drew Herman, [phone], and went over treatment team recommendations and crises plan 07/06/2018, 4:19 PM

## 2018-07-06 NOTE — Progress Notes (Signed)
[facility] MD Progress Note  07/06/2018 1:25 PM [name]  MRN:  [id number] Subjective:    [name] is a 57 y/o F with history of bipolar I who was admitted voluntarily from [facility] ED where she presented brought in by her daughter at recommendation of her outpatient provider due to worsening symptoms of mania and psychosis including disorganized thoughts, delusions, paranoia, AH, and VH. Pt has also been having medication changes recently as an outpatient. She was medically cleared and then transferred to [facility] for additional treatment and stabilization. She was started on trial of risperdal and discontinued from recently started outpatient medication of latuda. Dose of risperdal has been titrated up during her stay. Pt has been reporting incremental improvement of her presenting symptoms.  Today upon evaluation, pt shares, "I'm good -  Just my back hurts." She denies any specific concerns aside from some chronic low back pain, which has been responsive to PRN of ibuprofen, and pt was encouraged to utilize all available PRN's of ibuprofen to address that problem. She is sleeping well. Her appetite is good. She denies other physical complaints. She denies SI/HI/AH/VH. She denies symptoms of paranoia. She is tolerating her medications well, and she denies side effects. We discussed discharge planning, and pt feel she will be ready to discharge to outpatient level of care tomorrow. She is in agreement to continue her current regimen without changes. She had no further questions, comments, or concerns.   Principal Problem: Bipolar I disorder, current or most recent episode manic, with psychotic features with mixed features [facility]) Diagnosis:   Patient Active Problem List   Diagnosis Date Noted  . Bipolar I disorder, current or most recent episode manic, with psychotic features with mixed features (HCC) [F31.2] 07/04/2018  . Bipolar disorder, unspecified (HCC) [F31.9] 02/28/2012  . Bipolar 1  disorder, depressed (HCC) [F31.9] 12/27/2011  . OBESITY, MORBID [E66.01] 07/07/2009  . OTHER AND UNSPECIFIED BIPOLAR DISORDERS [F31.89] 07/07/2009  . HYPERTENSION [I10] 07/07/2009  . ASTHMA [J45.909] 07/07/2009  . CHEST PAIN [R07.9] 07/07/2009   Total Time spent with patient: 30 minutes  Past Psychiatric History: see H&P  Past Medical History:  Past Medical History:  Diagnosis Date  . Asthma   . Back pain   . Chest pain   . Diabetes mellitus type II   . Headache   . High cholesterol   . Hypertension   . Obesity, morbid (HCC)   . Other and unspecified bipolar disorders   . 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 Thyroid disease     Past Surgical History:  Procedure Laterality Date  . TUBAL LIGATION     Bilateral   Family History:  Family History  Problem Relation Age of Onset  . Heart attack Maternal Uncle   . Depression Maternal Uncle   . Depression Mother   . Alcohol abuse Maternal Grandfather   . Alcohol abuse Maternal Uncle   . Depression Maternal Grandmother    Family Psychiatric  History: see H&P Social History:  Social History   Substance and Sexual Activity  Alcohol Use No  . Alcohol/week: 0.0 standard drinks     Social History   Substance and Sexual Activity  Drug Use No   Comment: 05/13/2016 per pt no    Social History   Socioeconomic History  . Marital status: Widowed    Spouse name: Not on file  . Number of children: Not on file  . Years of education: Not on file  . Highest education level: Not  on file  Occupational History  . Occupation: Disabled    Associate Professor: UNEMPLOYED  Social Needs  . Financial resource strain: Not on file  . Food insecurity:    Worry: Not on file    Inability: Not on file  . Transportation needs:    Medical: Not on file    Non-medical: Not on file  Tobacco Use  . Smoking status: Never Smoker  . Smokeless tobacco: Never Used  Substance and Sexual Activity  . Alcohol use: No    Alcohol/week: 0.0 standard drinks  .  Drug use: No    Comment: 05/13/2016 per pt no  . Sexual activity: Yes    Birth control/protection: Surgical  Lifestyle  . Physical activity:    Days per week: Not on file    Minutes per session: Not on file  . Stress: Not on file  Relationships  . Social connections:    Talks on phone: Not on file    Gets together: Not on file    Attends religious service: Not on file    Active member of club or organization: Not on file    Attends meetings of clubs or organizations: Not on file    Relationship status: Not on file  Other Topics Concern  . Not on file  Social History Narrative   Married   No regular exercise   Additional Social History:                         Sleep: Good  Appetite:  Good  Current Medications: Current Facility-Administered Medications  Medication Dose Route Frequency Provider Last Rate Last Dose  . albuterol (PROVENTIL HFA;VENTOLIN HFA) 108 (90 Base) MCG/ACT inhaler 2 puff  2 puff Inhalation Q6H PRN Thermon Leyland, NP      . alum & mag hydroxide-simeth (MAALOX/MYLANTA) 200-200-20 MG/5ML suspension 30 mL  30 mL Oral Q4H PRN Fransisca Kaufmann A, NP      . amLODipine (NORVASC) tablet 10 mg  10 mg Oral Daily Fransisca Kaufmann A, NP   10 mg at 07/06/18 0810  . atorvastatin (LIPITOR) tablet 80 mg  80 mg Oral q morning - 10a Fransisca Kaufmann A, NP   80 mg at 07/06/18 1156  . escitalopram (LEXAPRO) tablet 20 mg  20 mg Oral BID Fransisca Kaufmann A, NP   20 mg at 07/06/18 0810  . glipiZIDE (GLUCOTROL) tablet 10 mg  10 mg Oral BID AC Antonieta Pert, MD   10 mg at 07/06/18 0981  . hydrOXYzine (ATARAX/VISTARIL) tablet 50 mg  50 mg Oral Q6H PRN Thermon Leyland, NP   50 mg at 07/05/18 2049  . ibuprofen (ADVIL,MOTRIN) tablet 600 mg  600 mg Oral Q6H PRN Micheal Likens, MD   600 mg at 07/05/18 1939  . insulin aspart (novoLOG) injection 0-15 Units  0-15 Units Subcutaneous TID WC Micheal Likens, MD   3 Units at 07/06/18 1159  . insulin aspart (novoLOG) injection 0-5  Units  0-5 Units Subcutaneous QHS Thermon Leyland, NP   3 Units at 07/05/18 2048  . insulin aspart (novoLOG) injection 10 Units  10 Units Subcutaneous TID WC Micheal Likens, MD   10 Units at 07/06/18 1156  . insulin glargine (LANTUS) injection 85 Units  85 Units Subcutaneous BID Micheal Likens, MD   85 Units at 07/06/18 1914  . levothyroxine (SYNTHROID, LEVOTHROID) tablet 50 mcg  50 mcg Oral QAC breakfast Thermon Leyland, NP  50 mcg at 07/06/18 0625  . magnesium hydroxide (MILK OF MAGNESIA) suspension 30 mL  30 mL Oral Daily PRN Fransisca Kaufmann A, NP      . risperiDONE (RISPERDAL) tablet 1 mg  1 mg Oral Veatrice Kells, MD   1 mg at 07/06/18 0810   And  . risperiDONE (RISPERDAL) tablet 2 mg  2 mg Oral QHS Micheal Likens, MD   2 mg at 07/05/18 2049  . traZODone (DESYREL) tablet 50 mg  50 mg Oral QHS PRN,MR X 1 Micheal Likens, MD   50 mg at 07/05/18 2049    Lab Results:  Results for orders placed or performed during the hospital encounter of 07/04/18 (from the past 48 hour(s))  Glucose, capillary     Status: Abnormal   Collection Time: 07/04/18  5:10 PM  Result Value Ref Range   Glucose-Capillary 321 (H) 70 - 99 mg/dL   Comment 1 Notify RN    Comment 2 Document in Chart   Glucose, capillary     Status: Abnormal   Collection Time: 07/04/18  7:57 PM  Result Value Ref Range   Glucose-Capillary 318 (H) 70 - 99 mg/dL  Glucose, capillary     Status: Abnormal   Collection Time: 07/05/18  6:26 AM  Result Value Ref Range   Glucose-Capillary 253 (H) 70 - 99 mg/dL  Glucose, capillary     Status: Abnormal   Collection Time: 07/05/18 12:10 PM  Result Value Ref Range   Glucose-Capillary 299 (H) 70 - 99 mg/dL  Glucose, capillary     Status: Abnormal   Collection Time: 07/05/18  4:54 PM  Result Value Ref Range   Glucose-Capillary 430 (H) 70 - 99 mg/dL  Glucose, capillary     Status: Abnormal   Collection Time: 07/05/18  6:56 PM  Result Value Ref  Range   Glucose-Capillary 329 (H) 70 - 99 mg/dL  Glucose, capillary     Status: Abnormal   Collection Time: 07/05/18  8:41 PM  Result Value Ref Range   Glucose-Capillary 259 (H) 70 - 99 mg/dL   Comment 1 Notify RN   Glucose, capillary     Status: Abnormal   Collection Time: 07/06/18  5:56 AM  Result Value Ref Range   Glucose-Capillary 188 (H) 70 - 99 mg/dL   Comment 1 Notify RN   Glucose, capillary     Status: Abnormal   Collection Time: 07/06/18 11:54 AM  Result Value Ref Range   Glucose-Capillary 161 (H) 70 - 99 mg/dL    Blood Alcohol level:  Lab Results  Component Value Date   ETH <10 07/03/2018   ETH <10 07/13/2017    Metabolic Disorder Labs: Lab Results  Component Value Date   HGBA1C 11.0 (H) 10/14/2014   MPG 269 (H) 10/14/2014   MPG 206 (H) 09/05/2010   No results found for: PROLACTIN Lab Results  Component Value Date   CHOL (H) 06/28/2009    388        ATP III CLASSIFICATION:  <200     mg/dL   Desirable  378-588  mg/dL   Borderline High  >=502    mg/dL   High          TRIG 774 (H) 06/28/2009   HDL 42 06/28/2009   CHOLHDL 9.2 06/28/2009   VLDL UNABLE TO CALCULATE IF TRIGLYCERIDE OVER 400 mg/dL 12/87/8676   LDLCALC  72/05/4708    UNABLE TO CALCULATE IF TRIGLYCERIDE OVER 400 mg/dL  Total Cholesterol/HDL:CHD Risk Coronary Heart Disease Risk Table                     Men   Women  1/2 Average Risk   3.4   3.3  Average Risk       5.0   4.4  2 X Average Risk   9.6   7.1  3 X Average Risk  23.4   11.0        Use the calculated Patient Ratio above and the CHD Risk Table to determine the patient's CHD Risk.        ATP III CLASSIFICATION (LDL):  <100     mg/dL   Optimal  607-371  mg/dL   Near or Above                    Optimal  130-159  mg/dL   Borderline  062-694  mg/dL   High  >854     mg/dL   Very High    Physical Findings: AIMS: Facial and Oral Movements Muscles of Facial Expression: None, normal Lips and Perioral Area: None, normal Jaw:  None, normal Tongue: None, normal,Extremity Movements Upper (arms, wrists, hands, fingers): None, normal Lower (legs, knees, ankles, toes): None, normal, Trunk Movements Neck, shoulders, hips: None, normal, Overall Severity Severity of abnormal movements (highest score from questions above): None, normal Incapacitation due to abnormal movements: None, normal Patient's awareness of abnormal movements (rate only patient's report): No Awareness, Dental Status Current problems with teeth and/or dentures?: No Does patient usually wear dentures?: No  CIWA:  CIWA-Ar Total: 0 COWS:  COWS Total Score: 1  Musculoskeletal: Strength & Muscle Tone: within normal limits Gait & Station: normal Patient leans: N/A  Psychiatric Specialty Exam: Physical Exam  Nursing note and vitals reviewed.   Review of Systems  Constitutional: Negative for chills and fever.  Respiratory: Negative for cough and shortness of breath.   Cardiovascular: Negative for chest pain.  Gastrointestinal: Negative for abdominal pain, heartburn, nausea and vomiting.  Psychiatric/Behavioral: Negative for depression, hallucinations and suicidal ideas. The patient is not nervous/anxious and does not have insomnia.     Blood pressure 123/74, pulse (!) 106, temperature 97.7 F (36.5 C), temperature source Oral, resp. rate 18, height 5\' 4"  (1.626 m), weight 135.6 kg, SpO2 96 %.Body mass index is 51.32 kg/m.  General Appearance: Casual and Fairly Groomed  Eye Contact:  Good  Speech:  Clear and Coherent and Normal Rate  Volume:  Normal  Mood:  Euthymic  Affect:  Appropriate and Congruent  Thought Process:  Coherent and Goal Directed  Orientation:  Full (Time, Place, and Person)  Thought Content:  Logical  Suicidal Thoughts:  No  Homicidal Thoughts:  No  Memory:  Immediate;   Fair Recent;   Fair Remote;   Fair  Judgement:  Fair  Insight:  Fair  Psychomotor Activity:  Normal  Concentration:  Concentration: Fair  Recall:   Good  Fund of Knowledge:  Fair  Language:  Fair  Akathisia:  No  Handed:    AIMS (if indicated):     Assets:  Resilience Social Support  ADL's:  Intact  Cognition:  WNL  Sleep:  Number of Hours: 6.75    Treatment Plan Summary: Daily contact with patient to assess and evaluate symptoms and progress in treatment and Medication management   -Continue inpatient hospitalization  -Bipolar I, current episode manic with mixed features and psychosis             -  Continue lexapro 20mg  po qDay             -Continue risperdal 1mg  po qAM + 2mg  po qhs  -insomnia             -continue trazodone 50mg  po qhs prn insomnia  -hypothyroidism              -Continue synthroid po qAM with breakfast  -DMII             -Continue current insulin protocol orders:                  -Continue novolog 0-15 units [place] TID with meals as per sliding scale                  -Continue novolog 0-5 units [place] qhs as per sliding scale                  -Continue novolog 10 units [place] TID with meals                  -Continue lantus 85 units [place] BID             -Continue glipixide 5mg  po BID with meals  -HTN             -Continue norvasc 10mg  po qDay  -Asthma              -Continue albuterol 108 mcg/act take 2 puffs q6h prn SOB/wheeze  -HLD             -Continue atorvastatin 80mg  po qDay  -anxiety             -Continue vistaril 50mg  po q6h prn anxiety  -Encourage participation in groups and therapeutic milieu  -disposition planning will be ongoing  Micheal Likens, MD 07/06/2018, 1:25 PM

## 2018-07-07 LAB — GLUCOSE, CAPILLARY
GLUCOSE-CAPILLARY: 115 mg/dL — AB (ref 70–99)
GLUCOSE-CAPILLARY: 276 mg/dL — AB (ref 70–99)
Glucose-Capillary: 102 mg/dL — ABNORMAL HIGH (ref 70–99)

## 2018-07-07 MED ORDER — INSULIN ASPART 100 UNIT/ML ~~LOC~~ SOLN: 10.0000 [IU] | Freq: Three times a day (TID) | SUBCUTANEOUS | 0 refills | Status: DC

## 2018-07-07 MED ORDER — LEVOTHYROXINE SODIUM 50 MCG PO TABS: 50.0000 ug | ORAL_TABLET | Freq: Every day | ORAL | 0 refills | Status: AC

## 2018-07-07 MED ORDER — GLIPIZIDE 10 MG PO TABS: 10.0000 mg | ORAL_TABLET | Freq: Two times a day (BID) | ORAL | 0 refills | Status: AC

## 2018-07-07 MED ORDER — AMLODIPINE BESYLATE 10 MG PO TABS: 10.0000 mg | ORAL_TABLET | Freq: Every day | ORAL | 0 refills | Status: AC

## 2018-07-07 MED ORDER — RISPERIDONE 1 MG PO TABS: 1.0000 mg | ORAL_TABLET | ORAL | 0 refills | Status: DC

## 2018-07-07 MED ORDER — ESCITALOPRAM OXALATE 20 MG PO TABS: 20.0000 mg | ORAL_TABLET | Freq: Two times a day (BID) | ORAL | 0 refills | Status: DC

## 2018-07-07 MED ORDER — INSULIN GLARGINE 100 UNIT/ML ~~LOC~~ SOLN: 85.0000 [IU] | Freq: Two times a day (BID) | SUBCUTANEOUS | 0 refills | Status: DC

## 2018-07-07 MED ORDER — ALBUTEROL SULFATE HFA 108 (90 BASE) MCG/ACT IN AERS: 2.0000 | INHALATION_SPRAY | Freq: Four times a day (QID) | RESPIRATORY_TRACT | 0 refills | Status: AC | PRN

## 2018-07-07 MED ORDER — TRAZODONE HCL 50 MG PO TABS: 50.0000 mg | ORAL_TABLET | Freq: Every evening | ORAL | 0 refills | Status: DC | PRN

## 2018-07-07 MED ORDER — HYDROXYZINE HCL 50 MG PO TABS: 50.0000 mg | ORAL_TABLET | Freq: Four times a day (QID) | ORAL | 0 refills | Status: DC | PRN

## 2018-07-07 MED ORDER — ATORVASTATIN CALCIUM 80 MG PO TABS: 80.0000 mg | ORAL_TABLET | Freq: Every morning | ORAL | 0 refills | Status: DC

## 2018-07-07 MED ORDER — RISPERIDONE 2 MG PO TABS: 2.0000 mg | ORAL_TABLET | Freq: Every day | ORAL | 0 refills | Status: DC

## 2018-07-07 NOTE — Progress Notes (Signed)
Recreation Therapy Notes  INPATIENT RECREATION TR PLAN  Patient Details Name: [name] MRN: [id number] DOB: Jul 12, 1961 Today's Date: 07/07/2018  Rec Therapy Plan Is patient appropriate for Therapeutic Recreation?: Yes Treatment times per week: about 3 days Estimated Length of Stay: 5-7 days TR Treatment/Interventions: Group participation (Comment)  Discharge Criteria Pt will be discharged from therapy if:: Discharged Treatment plan/goals/alternatives discussed and agreed upon by:: Patient/family  Discharge Summary Short term goals set: See patient care plan Short term goals met: Complete Progress toward goals comments: Groups attended Which groups?: Self-esteem Reason goals not met: None Therapeutic equipment acquired: N/A Reason patient discharged from therapy: Discharge from hospital Pt/family agrees with progress & goals achieved: Yes Date patient discharged from therapy: 07/07/18    Victorino Sparrow, LRT/CTRS  Ria Comment, Dotty Gonzalo A 07/07/2018, 11:36 AM
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
## 2018-07-07 NOTE — Plan of Care (Signed)
Discharge Note  Patient verbalizes readiness for discharge. Follow up plan explained, AVS, Transition record and SRA given. Prescriptions and teaching provided. Belongings returned and signed for. Suicide safety plan completed and signed. Patient verbalizes understanding. Patient denies SI/HI and assures this Clinical research associate he will seek assistance should that change. Patient discharged to lobby where daughter was waiting.  Problem: Education: Goal: Knowledge of [place] [facility] Education information/materials will improve Outcome: Adequate for Discharge Goal: Emotional status will improve Outcome: Adequate for Discharge Goal: Mental status will improve Outcome: Adequate for Discharge Goal: Verbalization of understanding the information provided will improve Outcome: Adequate for Discharge   Problem: Activity: Goal: Interest or engagement in activities will improve Outcome: Adequate for Discharge Goal: Sleeping patterns will improve Outcome: Adequate for Discharge   Problem: Coping: Goal: Ability to verbalize frustrations and anger appropriately will improve Outcome: Adequate for Discharge Goal: Ability to demonstrate self-control will improve Outcome: Adequate for Discharge   Problem: Health Behavior/Discharge Planning: Goal: Identification of resources available to assist in meeting health care needs will improve Outcome: Adequate for Discharge Goal: Compliance with treatment plan for underlying cause of condition will improve Outcome: Adequate for Discharge   Problem: Physical Regulation: Goal: Ability to maintain clinical measurements within normal limits will improve Outcome: Adequate for Discharge   Problem: Safety: Goal: Periods of time without injury will increase Outcome: Adequate for Discharge   Problem: Education: Goal: Utilization of techniques to improve thought processes will improve Outcome: Adequate for Discharge Goal: Knowledge of the prescribed therapeutic  regimen will improve Outcome: Adequate for Discharge   Problem: Activity: Goal: Interest or engagement in leisure activities will improve Outcome: Adequate for Discharge Goal: Imbalance in normal sleep/wake cycle will improve Outcome: Adequate for Discharge   Problem: Coping: Goal: Coping ability will improve Outcome: Adequate for Discharge Goal: Will verbalize feelings Outcome: Adequate for Discharge   Problem: Health Behavior/Discharge Planning: Goal: Ability to make decisions will improve Outcome: Adequate for Discharge Goal: Compliance with therapeutic regimen will improve Outcome: Adequate for Discharge   Problem: Role Relationship: Goal: Will demonstrate positive changes in social behaviors and relationships Outcome: Adequate for Discharge   Problem: Safety: Goal: Ability to disclose and discuss suicidal ideas will improve Outcome: Adequate for Discharge Goal: Ability to identify and utilize support systems that promote safety will improve Outcome: Adequate for Discharge   Problem: Self-Concept: Goal: Will verbalize positive feelings about self Outcome: Adequate for Discharge Goal: Level of anxiety will decrease Outcome: Adequate for Discharge   Problem: Education: Goal: Ability to state activities that reduce stress will improve Outcome: Adequate for Discharge   Problem: Coping: Goal: Ability to identify and develop effective coping behavior will improve Outcome: Adequate for Discharge   Problem: Self-Concept: Goal: Ability to identify factors that promote anxiety will improve Outcome: Adequate for Discharge Goal: Level of anxiety will decrease Outcome: Adequate for Discharge Goal: Ability to modify response to factors that promote anxiety will improve Outcome: Adequate for Discharge   Problem: Activity: Goal: Will verbalize the importance of balancing activity with adequate rest periods Outcome: Adequate for Discharge   Problem: Education: Goal: Will  be free of psychotic symptoms Outcome: Adequate for Discharge Goal: Knowledge of the prescribed therapeutic regimen will improve Outcome: Adequate for Discharge   Problem: Coping: Goal: Coping ability will improve Outcome: Adequate for Discharge Goal: Will verbalize feelings Outcome: Adequate for Discharge   Problem: Health Behavior/Discharge Planning: Goal: Compliance with prescribed medication regimen will improve Outcome: Adequate for Discharge   Problem: Nutritional: Goal: Ability  to achieve adequate nutritional intake will improve Outcome: Adequate for Discharge   Problem: Role Relationship: Goal: Ability to communicate needs accurately will improve Outcome: Adequate for Discharge Goal: Ability to interact with others will improve Outcome: Adequate for Discharge   Problem: Safety: Goal: Ability to redirect hostility and anger into socially appropriate behaviors will improve Outcome: Adequate for Discharge Goal: Ability to remain free from injury will improve Outcome: Adequate for Discharge   Problem: Self-Care: Goal: Ability to participate in self-care as condition permits will improve Outcome: Adequate for Discharge   Problem: Self-Concept: Goal: Will verbalize positive feelings about self Outcome: Adequate for Discharge

## 2018-07-07 NOTE — Plan of Care (Signed)
Pt was able to identify positive traits and characteristics about herself.   Caroll Rancher, LRT/CTRS

## 2018-07-07 NOTE — Discharge Summary (Addendum)
Physician Discharge Summary Note  Patient:  [name] is an 57 y.o., female MRN:  [id number] DOB:  Oct 02, 1960 Patient phone:  [phone] (home)  Patient address:   [address] [place] [address],  Total Time spent with patient: 20 minutes  Date of Admission:  07/04/2018 Date of Discharge: 07/07/18  Reason for Admission:  Worsening psychosis  Principal Problem: Bipolar I disorder, current or most recent episode manic, with psychotic features with mixed features [facility]) Discharge Diagnoses: Patient Active Problem List   Diagnosis Date Noted  . Bipolar I disorder, current or most recent episode manic, with psychotic features with mixed features (HCC) [F31.2] 07/04/2018  . Bipolar disorder, unspecified (HCC) [F31.9] 02/28/2012  . Bipolar 1 disorder, depressed (HCC) [F31.9] 12/27/2011  . OBESITY, MORBID [E66.01] 07/07/2009  . OTHER AND UNSPECIFIED BIPOLAR DISORDERS [F31.89] 07/07/2009  . HYPERTENSION [I10] 07/07/2009  . ASTHMA [J45.909] 07/07/2009  . CHEST PAIN [R07.9] 07/07/2009    Past Psychiatric History: -previous dx of bipolar - current outpatient with Dr. Tenny Craw at [facility] in [place] - about 5 previous inpt stays with last about 7 years ago - hx of suicide attempt x3 via overdose  Past Medical History:  Past Medical History:  Diagnosis Date  . Asthma   . Back pain   . Chest pain   . Diabetes mellitus type II   . Headache   . High cholesterol   . Hypertension   . Obesity, morbid (HCC)   . Other and unspecified bipolar disorders   . 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 Thyroid disease     Past Surgical History:  Procedure Laterality Date  . TUBAL LIGATION     Bilateral   Family History:  Family History  Problem Relation Age of Onset  . Heart attack Maternal Uncle   . Depression Maternal Uncle   . Depression Mother   . Alcohol abuse Maternal Grandfather   . Alcohol abuse Maternal Uncle   . Depression Maternal Grandmother    Family Psychiatric  History:  See above Social History:  Social History   Substance and Sexual Activity  Alcohol Use No  . Alcohol/week: 0.0 standard drinks     Social History   Substance and Sexual Activity  Drug Use No   Comment: 05/13/2016 per pt no    Social History   Socioeconomic History  . Marital status: Widowed    Spouse name: Not on file  . Number of children: Not on file  . Years of education: Not on file  . Highest education level: Not on file  Occupational History  . Occupation: Disabled    Associate Professor: UNEMPLOYED  Social Needs  . Financial resource strain: Not on file  . Food insecurity:    Worry: Not on file    Inability: Not on file  . Transportation needs:    Medical: Not on file    Non-medical: Not on file  Tobacco Use  . Smoking status: Never Smoker  . Smokeless tobacco: Never Used  Substance and Sexual Activity  . Alcohol use: No    Alcohol/week: 0.0 standard drinks  . Drug use: No    Comment: 05/13/2016 per pt no  . Sexual activity: Yes    Birth control/protection: Surgical  Lifestyle  . Physical activity:    Days per week: Not on file    Minutes per session: Not on file  . Stress: Not on file  Relationships  . Social connections:    Talks on phone: Not on file  Gets together: Not on file    Attends religious service: Not on file    Active member of club or organization: Not on file    Attends meetings of clubs or organizations: Not on file    Relationship status: Not on file  Other Topics Concern  . Not on file  Social History Narrative   Married   No regular exercise    Hospital Course:   07/04/18 [facility] MD Assessment: 57 y/o F with history of bipolar I who was admitted voluntarily from [facility] ED where she presented brought in by her daughter at recommendation of her outpatient provider due to worsening symptoms of mania and psychosis including disorganized thoughts, delusions, paranoia, AH, and VH. Pt has also been having medication changes recently as an  outpatient. She was medically cleared and then transferred to [facility] for additional treatment and stabilization. Upon initial interview, pt shares, "I have been hallucinating for about the past 2 weeks. My doctor changed my medications to help it, but they just have been getting worse." Pt describes recent history of AH and VH of seeing "evil spirits." She explains delusional structure that her husband whom passed away about 1 year ago actually faked his death and he is currently living with another woman in [place]. Pt explains that she feels he practices "voodoo" and he put a curse on her causing her to see evil spirits. She also has seen other things such as the door handle spinning in her home and emitting a smoke which smelled like cannabis smoke. She also describes seeing visions of her husband and his hand holding [place] currency and images of his face with horns coming out where his eyebrows should be. Pt reports her sleep has been poor, she has guilty feelings, depressed mood, low energy, poor concentration, and poor appetite. She endorses manic symptoms of decreased need for sleep, distractibility, flight of ideas, and increased activities. She denies symptoms of mania, OCD, and PTSD. She denies all illicit substance use including alcohol and tobacco. Discussed with patient about treatment options. She knows that she was recently changed to latuda (she has been taking it with adequate calories), but she does not know here previous medication trials. As per chart review she also takes lexapro, vistaril, and buspar. We discussed discontinuing buspar and changing vistaril to a lower dose but available more throughout the day as a PRN for anxiety. We will also change latuda to Risperdal to better address symptoms of psychosis and continue lexapro without changes. Pt was in agreement to the above plan, and she had no further questions, comments, or concerns.   Patient remained on the [facility] unit for 3 days. The  patient stabilized on medication and therapy. Patient was discharged on Lexapro 20 mg Daily, Vistaril 50 mg Q6H PRN, Risperdal 1 mg QAM and 2 mg QHS, and Trazodone 50 mg QHS PRN. Patient has shown improvement with improved mood, affect, sleep, appetite, and interaction. Patient has attended group and participated. Patient has been seen in the day room interacting with peers and staff appropriately. Patient denies any SI/HI/AVH and contracts for safety. Patient agrees to follow up at [facility] or [place]. Patient is provided with prescriptions for their medications upon discharge.   Physical Findings: AIMS: Facial and Oral Movements Muscles of Facial Expression: None, normal Lips and Perioral Area: None, normal Jaw: None, normal Tongue: None, normal,Extremity Movements Upper (arms, wrists, hands, fingers): None, normal Lower (legs, knees, ankles, toes): None, normal, Trunk Movements Neck,  shoulders, hips: None, normal, Overall Severity Severity of abnormal movements (highest score from questions above): None, normal Incapacitation due to abnormal movements: None, normal Patient's awareness of abnormal movements (rate only patient's report): No Awareness, Dental Status Current problems with teeth and/or dentures?: No Does patient usually wear dentures?: No  CIWA:  CIWA-Ar Total: 0 COWS:  COWS Total Score: 1  Musculoskeletal: Strength & Muscle Tone: within normal limits Gait & Station: normal Patient leans: N/A  Psychiatric Specialty Exam: Physical Exam  Nursing note and vitals reviewed. Constitutional: She is oriented to person, place, and time. She appears well-developed and well-nourished.  Cardiovascular: Normal rate.  Respiratory: Effort normal.  Musculoskeletal: Normal range of motion.  Neurological: She is alert and oriented to person, place, and time.  Skin: Skin is warm.    Review of Systems  Constitutional: Negative.   HENT: Negative.   Eyes:  Negative.   Respiratory: Negative.   Cardiovascular: Negative.   Gastrointestinal: Negative.   Genitourinary: Negative.   Musculoskeletal: Negative.   Skin: Negative.   Neurological: Negative.   Endo/Heme/Allergies: Negative.   Psychiatric/Behavioral: Negative.     Blood pressure 140/67, pulse 99, temperature (!) 97.5 F (36.4 C), temperature source Oral, resp. rate 18, height 5\' 4"  (1.626 m), weight 135.6 kg, SpO2 96 %.Body mass index is 51.32 kg/m.  General Appearance: Casual  Eye Contact:  Good  Speech:  Clear and Coherent and Normal Rate  Volume:  Normal  Mood:  Euthymic  Affect:  Congruent  Thought Process:  Goal Directed and Descriptions of Associations: Intact  Orientation:  Full (Time, Place, and Person)  Thought Content:  WDL  Suicidal Thoughts:  No  Homicidal Thoughts:  No  Memory:  Immediate;   Good Recent;   Good Remote;   Good  Judgement:  Fair  Insight:  Fair  Psychomotor Activity:  Normal  Concentration:  Concentration: Good and Attention Span: Good  Recall:  Good  Fund of Knowledge:  Good  Language:  Good  Akathisia:  No  Handed:  Right  AIMS (if indicated):     Assets:  Communication Skills Desire for Improvement Financial Resources/Insurance Housing Physical Health Social Support Transportation  ADL's:  Intact  Cognition:  WNL  Sleep:  Number of Hours: 6.75        Has this patient used any form of tobacco in the last 30 days? (Cigarettes, Smokeless Tobacco, Cigars, and/or Pipes) Yes, No  Blood Alcohol level:  Lab Results  Component Value Date   ETH <10 07/03/2018   ETH <10 07/13/2017    Metabolic Disorder Labs:  Lab Results  Component Value Date   HGBA1C 11.0 (H) 10/14/2014   MPG 269 (H) 10/14/2014   MPG 206 (H) 09/05/2010   No results found for: PROLACTIN Lab Results  Component Value Date   CHOL (H) 06/28/2009    388        ATP III CLASSIFICATION:  <200     mg/dL   Desirable  08/28/2009  mg/dL   Borderline High  161-096     mg/dL   High          TRIG >=045 (H) 06/28/2009   HDL 42 06/28/2009   CHOLHDL 9.2 06/28/2009   VLDL UNABLE TO CALCULATE IF TRIGLYCERIDE OVER 400 mg/dL 08/28/2009   LDLCALC  81/19/1478    UNABLE TO CALCULATE IF TRIGLYCERIDE OVER 400 mg/dL        Total Cholesterol/HDL:CHD Risk Coronary Heart Disease Risk Table  Men   Women  1/2 Average Risk   3.4   3.3  Average Risk       5.0   4.4  2 X Average Risk   9.6   7.1  3 X Average Risk  23.4   11.0        Use the calculated Patient Ratio above and the CHD Risk Table to determine the patient's CHD Risk.        ATP III CLASSIFICATION (LDL):  <100     mg/dL   Optimal  638-466  mg/dL   Near or Above                    Optimal  130-159  mg/dL   Borderline  599-357  mg/dL   High  >017     mg/dL   Very High    See Psychiatric Specialty Exam and Suicide Risk Assessment completed by Attending Physician prior to discharge.  Discharge destination:  Home  Is patient on multiple antipsychotic therapies at discharge:  No   Has Patient had three or more failed trials of antipsychotic monotherapy by history:  No  Recommended Plan for Multiple Antipsychotic Therapies: NA   Allergies as of 07/07/2018      Reactions   Amoxicillin Swelling, Other (See Comments)   Oral swelling   Doxycycline Nausea And Vomiting   Levofloxacin Other (See Comments)   headache   Propoxyphene N-acetaminophen Nausea And Vomiting      Medication List    STOP taking these medications   busPIRone 30 MG tablet Commonly known as:  BUSPAR   cetirizine 10 MG tablet Commonly known as:  ZYRTEC   fluticasone 50 MCG/ACT nasal spray Commonly known as:  FLONASE   hydrochlorothiazide 12.5 MG capsule Commonly known as:  MICROZIDE   hydrOXYzine 100 MG capsule Commonly known as:  VISTARIL   insulin NPH-regular Human (70-30) 100 UNIT/ML injection Commonly known as:  NOVOLIN 70/30   lurasidone 40 MG Tabs tablet Commonly known as:  LATUDA    metoprolol succinate 50 MG 24 hr tablet Commonly known as:  TOPROL-XL   montelukast 10 MG tablet Commonly known as:  SINGULAIR   multivitamin with minerals Tabs tablet   topiramate 50 MG tablet Commonly known as:  TOPAMAX     TAKE these medications     Indication  albuterol 108 (90 Base) MCG/ACT inhaler Commonly known as:  PROVENTIL HFA;VENTOLIN HFA Inhale 2 puffs into the lungs every 6 (six) hours as needed for wheezing or shortness of breath.  Indication:  Asthma   amLODipine 10 MG tablet Commonly known as:  NORVASC Take 1 tablet (10 mg total) by mouth daily. For high blood pressure What changed:  additional instructions  Indication:  High Blood Pressure Disorder   atorvastatin 80 MG tablet Commonly known as:  LIPITOR Take 1 tablet (80 mg total) by mouth every morning.  Indication:  High Amount of Fats in the Blood   escitalopram 20 MG tablet Commonly known as:  LEXAPRO Take 1 tablet (20 mg total) by mouth 2 (two) times daily. for mood control What changed:  additional instructions  Indication:  Major Depressive Disorder   glipiZIDE 10 MG tablet Commonly known as:  GLUCOTROL Take 1 tablet (10 mg total) by mouth 2 (two) times daily before a meal. What changed:    medication strength  how much to take  Indication:  Type 2 Diabetes   hydrOXYzine 50 MG tablet Commonly known as:  ATARAX/VISTARIL Take  1 tablet (50 mg total) by mouth every 6 (six) hours as needed for anxiety.  Indication:  Feeling Anxious   insulin aspart 100 UNIT/ML injection Commonly known as:  novoLOG Inject 10 Units into the skin 3 (three) times daily with meals.  Indication:  Type 2 Diabetes   insulin glargine 100 UNIT/ML injection Commonly known as:  LANTUS Inject 0.85 mLs (85 Units total) into the skin 2 (two) times daily.  Indication:  Type 2 Diabetes   levothyroxine 50 MCG tablet Commonly known as:  SYNTHROID, LEVOTHROID Take 1 tablet (50 mcg total) by mouth daily before breakfast.  For hypothyroidism What changed:  additional instructions  Indication:  Underactive Thyroid   risperiDONE 2 MG tablet Commonly known as:  RISPERDAL Take 1 tablet (2 mg total) by mouth at bedtime. For mood control  Indication:  mood stability   risperiDONE 1 MG tablet Commonly known as:  RISPERDAL Take 1 tablet (1 mg total) by mouth every morning. For mood control Start taking on:  07/08/2018  Indication:  mood stability   traZODone 50 MG tablet Commonly known as:  DESYREL Take 1 tablet (50 mg total) by mouth at bedtime as needed for sleep.  Indication:  Trouble Sleeping      Follow-up Information    [facility] Follow up on 07/10/2018.   Specialty:  Behavioral Health Why:  Monday at 11:00 with Dr Gildardo Pounds information: [address] [place] [address] [phone]       [facility] Follow up on 07/12/2018.   Specialty:  Behavioral Health Why:  Wednesday at 8:30 with Garnett Sink for your admissin for the Mental Health Intensive Outpatient Program Contact information: [address] [place] [address] [place] [address] [phone]          Follow-up recommendations:  Continue activity as tolerated. Continue diet as recommended by your PCP. Ensure to keep all appointments with outpatient providers.  Comments:  Patient is instructed prior to discharge to: Take all medications as prescribed by his/her mental healthcare provider. Report any adverse effects and or reactions from the medicines to his/her outpatient provider promptly. Patient has been instructed & cautioned: To not engage in alcohol and or illegal drug use while on prescription medicines. In the event of worsening symptoms, patient is instructed to call the crisis hotline, 911 and or go to the nearest ED for appropriate evaluation and treatment of symptoms. To follow-up with his/her primary care  provider for your other medical issues, concerns and or health care needs.    Signed: Gerlene Burdock Money, FNP 07/07/2018, 10:20 AM   Patient seen, Suicide Assessment Completed.  Disposition Plan Reviewed

## 2018-07-07 NOTE — Progress Notes (Signed)
D: Patient observed up and visible this evening. CBG prior to hs snack was "211." Patient states she has had a good day. Somewhat cautious with this Clinical research associate. Complained of anxiety and asked for sleep aid. Patient's affect anxious with congruent mood. Patient pleasant and cooperative.  Denies pain, physical complaints.   A: Medicated per orders, prn trazadone and vistaril given. Medication education provided. Level III obs in place for safety. Emotional support offered. Patient encouraged to attend and participate in unit programming.    R: Patient verbalizes understanding of POC. On reassess, patient was asleep. Patient denies SI/HI/AVH and remains safe on level III obs. Will continue to monitor throughout the night.

## 2018-07-07 NOTE — Plan of Care (Signed)
  Problem: Education: Goal: Verbalization of understanding the information provided will improve Outcome: Progressing   Problem: Coping: Goal: Ability to demonstrate self-control will improve Outcome: Progressing   

## 2018-07-07 NOTE — Progress Notes (Signed)
  [facility] Adult Case Management Discharge Plan :  Will you be returning to the same living situation after discharge:  Yes,  home At discharge, do you have transportation home?: Yes,  daughter Do you have the ability to pay for your medications: Yes,  insurance  Release of information consent forms completed and in the chart;  Patient's signature needed at discharge.  Patient to Follow up at: Follow-up Information    [facility] Follow up on 07/10/2018.   Specialty:  Behavioral Health Why:  Monday at 11:00 with Dr Gildardo Pounds information: [address] [place] [address] [phone]       [facility] Follow up on 07/12/2018.   Specialty:  Behavioral Health Why:  Wednesday at 8:30 with [place] Sink for your admissin for the Mental Health Intensive Outpatient Program Contact information: [address] [place] [address] [phone]          Next level of care provider has access to [facility] Link:yes  Safety Planning and Suicide Prevention discussed: Yes,  yes     Has patient been referred to the Quitline?: N/A patient is not a smoker  Patient has been referred for addiction treatment: N/A  [name], LCSW 07/07/2018, 8:54 AM

## 2018-07-07 NOTE — [facility] Group Notes (Signed)
Group Therapy: Chaplain-Led Processing Group  Participation Level:  Active  Participation Quality:  Attentive  Affect:  Flat  Cognitive:  Oriented  Insight:  Limited  Engagement in Therapy:  Limited  Modes of Intervention:  Discussion, Socialization  Summary of Progress/Problems:  Chaplain was here to lead a group on themes of hope and courage. [name] attended the entire session. She engaged in the discussion appropriately when prompted. She state that her children have helped her be hopeful then related a story on the topic.Holding on to hope remembering that "This too shall pass".  Marian Sorrow MSW Intern 07/07/2018 1:14 PM

## 2018-07-07 NOTE — Progress Notes (Signed)
Recreation Therapy Notes  Date: 10.11.19 Time: 1000 Location: [address] Dayroom   Group Topic: Communication, Team Building, Problem Solving  Goal Area(s) Addresses:  Patient will effectively work with peer towards shared goal.  Patient will identify skills used to make activity successful.  Patient will identify how skills used during activity can be used to reach post d/c goals.   Intervention: STEM Activity, Music  Activity: Landing Pad. In teams patients were given 12 plastic drinking straws and a length of masking tape. Using the materials provided patients were asked to build a landing pad to catch a golf ball dropped from approximately 6 feet in the air.   Education: Pharmacist, community, Discharge Planning   Education Outcome: Acknowledges education/In group clarification offered/Needs additional education.   Clinical Observations/Feedback: Pt did not attend group.    Caroll Rancher, LRT/CTRS         Lillia Abed, Josedejesus Marcum A 07/07/2018 11:29 AM

## 2018-07-10 ENCOUNTER — Encounter (HOSPITAL_COMMUNITY): Payer: Self-pay | Admitting: Psychiatry

## 2018-07-10 ENCOUNTER — Ambulatory Visit (INDEPENDENT_AMBULATORY_CARE_PROVIDER_SITE_OTHER): Payer: Medicare HMO | Admitting: Psychiatry

## 2018-07-10 VITALS — BP 138/81 | HR 71 | Ht 64.0 in | Wt 323.0 lb

## 2018-07-10 DIAGNOSIS — Z560 Unemployment, unspecified: Secondary | ICD-10-CM | POA: Diagnosis not present

## 2018-07-10 DIAGNOSIS — F3130 Bipolar disorder, current episode depressed, mild or moderate severity, unspecified: Secondary | ICD-10-CM | POA: Diagnosis not present

## 2018-07-10 MED ORDER — HYDROXYZINE HCL 50 MG PO TABS: 50.0000 mg | ORAL_TABLET | Freq: Four times a day (QID) | ORAL | 2 refills | Status: DC | PRN

## 2018-07-10 MED ORDER — ESCITALOPRAM OXALATE 20 MG PO TABS: 20.0000 mg | ORAL_TABLET | Freq: Two times a day (BID) | ORAL | 2 refills | Status: DC

## 2018-07-10 MED ORDER — RISPERIDONE 1 MG PO TABS: ORAL_TABLET | ORAL | 2 refills | Status: DC

## 2018-07-10 MED ORDER — TRAZODONE HCL 50 MG PO TABS: 50.0000 mg | ORAL_TABLET | Freq: Every evening | ORAL | 2 refills | Status: DC | PRN

## 2018-07-10 NOTE — Progress Notes (Signed)
BH MD/PA/NP OP Progress Note  07/10/2018 11:31 AM [name]  MRN:  [id number]  Chief Complaint:  Chief Complaint    Depression; Anxiety; Follow-up     HPI: This patient is a 57 year oldwidowed white female who lives alonein [place]. She has 2 grown daughters and one 57 year old grandson. She is on disability for both back pain and bipolar disorder.  The patient was referred by Dr. Lolly Mustache from our [facility] clinic. The patient would like to start coming here as it is too far for her to drive to the other clinic.  The patient states that she has had problems with mood since her mid 59s. She began losing her temper having severe outburst mood swings and lashing out at people. 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 She began being paranoid and accusing people of doing things behind her back or talking about her. She was hospitalized 4 times the last time being at the [facility] in 2011. At that time she was very depressed hallucinating and hearing command hallucinations to cut herself or kill herself. She was Stabilized on a combination of lithium Wellbutrin and Risperdal.She had seen Dr. Lolly Mustache for a while and had been transferred to me after it became too far to drive to the [place] clinic.  Patient and daughter return after 1 week.  The patient was seen here on 07/03/2018.  At the time she was psychotic and delusional.  She was having visual hallucinations of her dead husband the devil and other things as noted in the last note.  Since these were plaguing her to the point of not being able to sleep I suggested she be hospitalized.  She was hospitalized from 10/8 through 07/07/2018 in the [facility].  While there she participated in the group therapy which was helpful.  She also was discontinued from [place] and started on Risperdal.  She was also discontinued from BuSpar and  hydroxyzine was used for anxiety.  She is no longer having hallucinations.  She is on trazodone at night and its helping her sleep.  She is also been referred to the intensive outpatient program which should help give her some socialization skills and people to talk to.  She denies depression or suicidal ideation or any manic symptoms such as activation. Visit Diagnosis:    ICD-10-CM   1. Bipolar I disorder, most recent episode depressed (HCC) F31.30     Past Psychiatric History: 4 previous hospitalizations for bipolar disorder, long-term outpatient treatment  Past Medical History:  Past Medical History:  Diagnosis Date  . Asthma   . Back pain   . Chest pain   . Diabetes mellitus type II   . Headache   . High cholesterol   . Hypertension   . Obesity, morbid (HCC)   . Other and unspecified bipolar disorders   . 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 Thyroid disease     Past Surgical History:  Procedure Laterality Date  . TUBAL LIGATION     Bilateral    Family Psychiatric History: See below  Family History:  Family History  Problem Relation Age of Onset  . Heart attack Maternal Uncle   . Depression Maternal Uncle   . Depression Mother   . Alcohol abuse Maternal Grandfather   . Alcohol abuse Maternal Uncle   . Depression Maternal Grandmother     Social History:  Social History   Socioeconomic History  .  Marital status: Widowed    Spouse name: Not on file  . Number of children: Not on file  . Years of education: Not on file  . Highest education level: Not on file  Occupational History  . Occupation: Disabled    Associate Professor: UNEMPLOYED  Social Needs  . Financial resource strain: Not on file  . Food insecurity:    Worry: Not on file    Inability: Not on file  . Transportation needs:    Medical: Not on file    Non-medical: Not on file  Tobacco Use  . Smoking status: Never Smoker  . Smokeless tobacco: Never Used  Substance and Sexual Activity  . Alcohol use: No     Alcohol/week: 0.0 standard drinks  . Drug use: No    Comment: 05/13/2016 per pt no  . Sexual activity: Yes    Birth control/protection: Surgical  Lifestyle  . Physical activity:    Days per week: Not on file    Minutes per session: Not on file  . Stress: Not on file  Relationships  . Social connections:    Talks on phone: Not on file    Gets together: Not on file    Attends religious service: Not on file    Active member of club or organization: Not on file    Attends meetings of clubs or organizations: Not on file    Relationship status: Not on file  Other Topics Concern  . Not on file  Social History Narrative   Married   No regular exercise    Allergies:  Allergies  Allergen Reactions  . Amoxicillin Swelling and Other (See Comments)    Oral swelling   . Doxycycline Nausea And Vomiting  . Levofloxacin Other (See Comments)    headache  . Propoxyphene N-Acetaminophen Nausea And Vomiting    Metabolic Disorder Labs: Lab Results  Component Value Date   HGBA1C 11.0 (H) 10/14/2014   MPG 269 (H) 10/14/2014   MPG 206 (H) 09/05/2010   No results found for: PROLACTIN Lab Results  Component Value Date   CHOL (H) 06/28/2009    388        ATP III CLASSIFICATION:  <200     mg/dL   Desirable  366-294  mg/dL   Borderline High  >=765    mg/dL   High          TRIG 465 (H) 06/28/2009   HDL 42 06/28/2009   CHOLHDL 9.2 06/28/2009   VLDL UNABLE TO CALCULATE IF TRIGLYCERIDE OVER 400 mg/dL 03/54/6568   LDLCALC  12/75/1700    UNABLE TO CALCULATE IF TRIGLYCERIDE OVER 400 mg/dL        Total Cholesterol/HDL:CHD Risk Coronary Heart Disease Risk Table                     Men   Women  1/2 Average Risk   3.4   3.3  Average Risk       5.0   4.4  2 X Average Risk   9.6   7.1  3 X Average Risk  23.4   11.0        Use the calculated Patient Ratio above and the CHD Risk Table to determine the patient's CHD Risk.        ATP III CLASSIFICATION (LDL):  <100     mg/dL   Optimal   174-944  mg/dL   Near or Above  Optimal  130-159  mg/dL   Borderline  527-782  mg/dL   High  >423     mg/dL   Very High   Lab Results  Component Value Date   TSH 4.88 (H) 02/03/2016   TSH 4.395 10/14/2014    Therapeutic Level Labs: Lab Results  Component Value Date   LITHIUM 0.6 (L) 07/07/2016   LITHIUM 0.6 (L) 03/10/2016   No results found for: VALPROATE No components found for:  CBMZ  Current Medications: Current Outpatient Medications  Medication Sig Dispense Refill  . albuterol (PROVENTIL HFA;VENTOLIN HFA) 108 (90 Base) MCG/ACT inhaler Inhale 2 puffs into the lungs every 6 (six) hours as needed for wheezing or shortness of breath. 1 Inhaler 0  . amLODipine (NORVASC) 10 MG tablet Take 1 tablet (10 mg total) by mouth daily. For high blood pressure 30 tablet 0  . atorvastatin (LIPITOR) 80 MG tablet Take 1 tablet (80 mg total) by mouth every morning. 30 tablet 0  . escitalopram (LEXAPRO) 20 MG tablet Take 1 tablet (20 mg total) by mouth 2 (two) times daily. for mood control 180 tablet 2  . glipiZIDE (GLUCOTROL) 10 MG tablet Take 1 tablet (10 mg total) by mouth 2 (two) times daily before a meal. 60 tablet 0  . hydrOXYzine (ATARAX/VISTARIL) 50 MG tablet Take 1 tablet (50 mg total) by mouth every 6 (six) hours as needed for anxiety. 90 tablet 2  . insulin aspart (NOVOLOG) 100 UNIT/ML injection Inject 10 Units into the skin 3 (three) times daily with meals. 10 mL 0  . insulin glargine (LANTUS) 100 UNIT/ML injection Inject 0.85 mLs (85 Units total) into the skin 2 (two) times daily. 10 mL 0  . levothyroxine (SYNTHROID, LEVOTHROID) 50 MCG tablet Take 1 tablet (50 mcg total) by mouth daily before breakfast. For hypothyroidism 30 tablet 0  . risperiDONE (RISPERDAL) 1 MG tablet Take one in the am and two at bedtime 270 tablet 2  . traZODone (DESYREL) 50 MG tablet Take 1 tablet (50 mg total) by mouth at bedtime as needed for sleep. 90 tablet 2   No current  facility-administered medications for this visit.      Musculoskeletal: Strength & Muscle Tone: decreased Gait & Station: unsteady Patient leans: N/A  Psychiatric Specialty Exam: Review of Systems  Musculoskeletal: Positive for back pain and joint pain.  All other systems reviewed and are negative.   Blood pressure 138/81, pulse 71, height 5\' 4"  (1.626 m), weight (!) 323 lb (146.5 kg), SpO2 99 %.Body mass index is 55.44 kg/m.  General Appearance: Casual and Disheveled  Eye Contact:  Good  Speech:  Clear and Coherent  Volume:  Normal  Mood:  Euthymic  Affect:  Flat  Thought Process:  Goal Directed  Orientation:  Full (Time, Place, and Person)  Thought Content: WDL   Suicidal Thoughts:  No  Homicidal Thoughts:  No  Memory:  Immediate;   Good Recent;   Good Remote;   Fair  Judgement:  Fair  Insight:  Shallow  Psychomotor Activity:  Decreased  Concentration:  Concentration: Fair and Attention Span: Fair  Recall:  Poor  Fund of Knowledge: Fair  Language: Good  Akathisia:  No  Handed:  Right  AIMS (if indicated): not done  Assets:  Communication Skills Desire for Improvement Resilience Social Support Talents/Skills  ADL's:  Intact  Cognition: WNL  Sleep:  Good   Screenings: AIMS     [facility]  AIMS Total Score  0    AUDIT     Ad[facility] ([facility]  Alcohol Use Disorder Identification Test Final Score (AUDIT)  0    MDI     [facility]  Total Score (max 50)  25    PHQ2-9     Counselor from [facility]21/2016 [facility]  PHQ-2 Total Score  4  PHQ-9 Total Score  15       Assessment and Plan: Patient is a 57 year old female with a history of bipolar disorder with psychotic features.  She was just hospitalized  last week because her psychotic symptoms worsened.  There are many factors involved including a religious lecture that she attended regarding the devil which seemed to spark her psychotic symptoms.  At any rate she is doing better now and will continue on Risperdal 1 mg every morning and 2 mg at bedtime for psychosis, trazodone 50 mg at bedtime for sleep, hydroxyzine 50 mill grams every 6 hours as needed for anxiety and Lexapro 20 mg twice daily for depression.  She will start the intensive outpatient program this week.  She will return to see me in 4 weeks   [name], MD 07/10/2018, 11:31 AM

## 2018-07-11 ENCOUNTER — Telehealth (HOSPITAL_COMMUNITY): Payer: Self-pay | Admitting: Psychiatry

## 2018-07-11 NOTE — Telephone Encounter (Signed)
D:  Patient referred per inpt unit at [facility], LCSW to start MH-IOP on 07-13-18.  Placed call to orient pt and answer her questions.  Pt states she only has Medicare.  "I can't afford to pay anything."  Informed patient that she would be billed after she completes MH-IOP and would be able to make payment arrangements; since Medicare only covers 80%.  "I thought the program would be free."  Pt is declining MH-IOP at this time.  A:  Discussed [facility] with pt.  Pt states she is interested in that program since it is free.  Provided pt with the phone number.  R:  Pt receptive.

## 2018-07-14 ENCOUNTER — Telehealth (HOSPITAL_COMMUNITY): Payer: Self-pay | Admitting: *Deleted

## 2018-07-14 NOTE — Telephone Encounter (Signed)
[employer]  APPROVED  Risperidone 1 mg Tablet  # 270 Effective Until 07/15/2019

## 2018-07-16 ENCOUNTER — Encounter (HOSPITAL_COMMUNITY): Payer: Self-pay

## 2018-07-16 ENCOUNTER — Observation Stay (HOSPITAL_COMMUNITY)
Admission: EM | Admit: 2018-07-16 | Discharge: 2018-07-18 | Disposition: A | Discharge status: Home / Self Care | Payer: Medicare HMO | Attending: Internal Medicine | Admitting: Internal Medicine

## 2018-07-16 ENCOUNTER — Emergency Department (HOSPITAL_COMMUNITY): Payer: Medicare HMO

## 2018-07-16 ENCOUNTER — Other Ambulatory Visit: Payer: Self-pay

## 2018-07-16 DIAGNOSIS — R079 Chest pain, unspecified: Secondary | ICD-10-CM

## 2018-07-16 DIAGNOSIS — Z794 Long term (current) use of insulin: Secondary | ICD-10-CM | POA: Diagnosis present

## 2018-07-16 DIAGNOSIS — E039 Hypothyroidism, unspecified: Secondary | ICD-10-CM | POA: Diagnosis not present

## 2018-07-16 DIAGNOSIS — E785 Hyperlipidemia, unspecified: Secondary | ICD-10-CM | POA: Insufficient documentation

## 2018-07-16 DIAGNOSIS — J45909 Unspecified asthma, uncomplicated: Secondary | ICD-10-CM | POA: Diagnosis not present

## 2018-07-16 DIAGNOSIS — J810 Acute pulmonary edema: Secondary | ICD-10-CM

## 2018-07-16 DIAGNOSIS — R0789 Other chest pain: Principal | ICD-10-CM | POA: Insufficient documentation

## 2018-07-16 DIAGNOSIS — R0602 Shortness of breath: Secondary | ICD-10-CM | POA: Diagnosis present

## 2018-07-16 DIAGNOSIS — Z23 Encounter for immunization: Secondary | ICD-10-CM | POA: Insufficient documentation

## 2018-07-16 DIAGNOSIS — I10 Essential (primary) hypertension: Secondary | ICD-10-CM | POA: Diagnosis not present

## 2018-07-16 DIAGNOSIS — E669 Obesity, unspecified: Secondary | ICD-10-CM | POA: Insufficient documentation

## 2018-07-16 DIAGNOSIS — E119 Type 2 diabetes mellitus without complications: Secondary | ICD-10-CM | POA: Diagnosis not present

## 2018-07-16 DIAGNOSIS — E1159 Type 2 diabetes mellitus with other circulatory complications: Secondary | ICD-10-CM | POA: Diagnosis present

## 2018-07-16 DIAGNOSIS — F319 Bipolar disorder, unspecified: Secondary | ICD-10-CM | POA: Diagnosis present

## 2018-07-16 DIAGNOSIS — Z79899 Other long term (current) drug therapy: Secondary | ICD-10-CM | POA: Diagnosis not present

## 2018-07-16 LAB — CBC
HCT: 31.9 % — ABNORMAL LOW (ref 36.0–46.0)
Hemoglobin: 10 g/dL — ABNORMAL LOW (ref 12.0–15.0)
MCH: 27.9 pg (ref 26.0–34.0)
MCHC: 31.3 g/dL (ref 30.0–36.0)
MCV: 88.9 fL (ref 80.0–100.0)
NRBC: 0 % (ref 0.0–0.2)
Platelets: 281 10*3/uL (ref 150–400)
RBC: 3.59 MIL/uL — AB (ref 3.87–5.11)
RDW: 12.4 % (ref 11.5–15.5)
WBC: 6.6 10*3/uL (ref 4.0–10.5)

## 2018-07-16 NOTE — ED Notes (Signed)
Phlebotomy notified of lab need

## 2018-07-16 NOTE — ED Notes (Signed)
Pt ambulated from triage to room.

## 2018-07-16 NOTE — ED Notes (Addendum)
Blood collected by phlebotomy  

## 2018-07-16 NOTE — ED Provider Notes (Signed)
Patient states over the past couple weeks she has a feeling that she has chest congestion but she does not have cough or fever.  She states she started noticing swelling of her lower legs and feel like her abdomen is getting bloated.  She has also started having dyspnea on exertion and some chest pain for the past couple weeks.  She has PND but she states that it is chronic.  Patient is rotund with very round face ease.  She is laying almost flat and does not appear to be short of breath.  She does not have any pitting edema of her lower extremities.  Medical screening examination/treatment/procedure(s) were conducted as a shared visit with non-physician practitioner(s) and myself.  I personally evaluated the patient during the encounter.  EKG Interpretation  Date/Time:  Sunday July 16 2018 21:05:03 EDT Ventricular Rate:  75 PR Interval:  194 QRS Duration: 94 QT Interval:  446 QTC Calculation: 498 R Axis:   55 Text Interpretation:  Normal sinus rhythm Cannot rule out Anterior infarct , age undetermined No significant change since last tracing 03 Jul 2018 Confirmed by Devoria Albe ([id number]) on 07/16/2018 11:22:45 PM     Devoria Albe, MD 07/17/18 [id number]

## 2018-07-16 NOTE — ED Provider Notes (Signed)
[facility] EMERGENCY DEPARTMENT Provider Note   CSN: [id number] Arrival date & time: 07/16/18  2053     History   Chief Complaint Chief Complaint  Patient presents with  . Shortness of Breath    chest pain    HPI [name] is a  very pleasant 57 y.o. female who presents for evaluation of cp/sob.  She has a history of hypertension, insulin-dependent diabetes, hyperlipidemia, bipolar disorder and hypothyroidism.  The patient states that all week she has had fatigue, malaise and body aches.  She states that she just felt like she was coming down with "a bug."  He is also noticed that when she is walking she begins to have pain behind her sternum and significant tightness in her chest.  She has never had anything like this before.  She says she thought it might be her asthma and she has been out of her albuterol tablets and inhaler.  She says her asthma has never presented like that so she did not know what to think.  She said that today when she was lying in bed trying to go to bed early because she was not feeling well she suddenly became extremely short of breath.  She got up to make herself some water and then called her daughter and said she might need to come to the emergency department.  Her pain and tightness were resolved at rest however she says that coming from the lobby back to her room she became very winded and felt like she had extreme tightness on her chest.  She denies a history of DVT or pulmonary embolism.  She denies pleuritic chest pain.  She denies cough, symptoms of URI.  She denies PND or orthopnea. HPI  Past Medical History:  Diagnosis Date  . Asthma   . Back pain   . Chest pain   . Diabetes mellitus type II   . Headache   . High cholesterol   . Hypertension   . Obesity, morbid (HCC)   . Other and unspecified bipolar disorders   . 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 Thyroid disease     Patient Active Problem List   Diagnosis Date Noted  . Bipolar I  disorder, current or most recent episode manic, with psychotic features with mixed features (HCC) 07/04/2018  . Bipolar disorder, unspecified (HCC) 02/28/2012  . Bipolar 1 disorder, depressed (HCC) 12/27/2011  . OBESITY, MORBID 07/07/2009  . OTHER AND UNSPECIFIED BIPOLAR DISORDERS 07/07/2009  . HYPERTENSION 07/07/2009  . ASTHMA 07/07/2009  . CHEST PAIN 07/07/2009    Past Surgical History:  Procedure Laterality Date  . TUBAL LIGATION     Bilateral     OB History    Gravida  2   Para  2   Term  2   Preterm      AB      Living  2     SAB      TAB      Ectopic      Multiple      Live Births               Home Medications    Prior to Admission medications   Medication Sig Start Date End Date Taking? Authorizing Provider  albuterol (PROVENTIL HFA;VENTOLIN HFA) 108 (90 Base) MCG/ACT inhaler Inhale 2 puffs into the lungs every 6 (six) hours as needed for wheezing or shortness of breath. 07/07/18   Money, Gerlene Burdock, FNP  amLODipine (NORVASC) 10 MG tablet  Take 1 tablet (10 mg total) by mouth daily. For high blood pressure 07/07/18   Money, Gerlene Burdock, FNP  atorvastatin (LIPITOR) 80 MG tablet Take 1 tablet (80 mg total) by mouth every morning. 07/07/18   Money, Gerlene Burdock, FNP  escitalopram (LEXAPRO) 20 MG tablet Take 1 tablet (20 mg total) by mouth 2 (two) times daily. for mood control 07/10/18   Myrlene Broker, MD  glipiZIDE (GLUCOTROL) 10 MG tablet Take 1 tablet (10 mg total) by mouth 2 (two) times daily before a meal. 07/07/18   Money, Gerlene Burdock, FNP  hydrOXYzine (ATARAX/VISTARIL) 50 MG tablet Take 1 tablet (50 mg total) by mouth every 6 (six) hours as needed for anxiety. 07/10/18   Myrlene Broker, MD  insulin aspart (NOVOLOG) 100 UNIT/ML injection Inject 10 Units into the skin 3 (three) times daily with meals. 07/07/18   Money, Gerlene Burdock, FNP  insulin glargine (LANTUS) 100 UNIT/ML injection Inject 0.85 mLs (85 Units total) into the skin 2 (two) times daily. 07/07/18    Money, Gerlene Burdock, FNP  levothyroxine (SYNTHROID, LEVOTHROID) 50 MCG tablet Take 1 tablet (50 mcg total) by mouth daily before breakfast. For hypothyroidism 07/07/18   Money, Gerlene Burdock, FNP  risperiDONE (RISPERDAL) 1 MG tablet Take one in the am and two at bedtime 07/10/18   Myrlene Broker, MD  traZODone (DESYREL) 50 MG tablet Take 1 tablet (50 mg total) by mouth at bedtime as needed for sleep. 07/10/18   Myrlene Broker, MD    Family History Family History  Problem Relation Age of Onset  . Heart attack Maternal Uncle   . Depression Maternal Uncle   . Depression Mother   . Alcohol abuse Maternal Grandfather   . Alcohol abuse Maternal Uncle   . Depression Maternal Grandmother     Social History Social History   Tobacco Use  . Smoking status: Never Smoker  . Smokeless tobacco: Never Used  Substance Use Topics  . Alcohol use: No    Alcohol/week: 0.0 standard drinks  . Drug use: No    Comment: 05/13/2016 per pt no     Allergies   Amoxicillin; Doxycycline; Levofloxacin; and Propoxyphene n-acetaminophen   Review of Systems Review of Systems Ten systems reviewed and are negative for acute change, except as noted in the HPI.    Physical Exam Updated Vital Signs BP (!) 153/69   Pulse 76   Temp 98.1 F (36.7 C) (Oral)   Resp 20   Ht 5\' 4"  (1.626 m)   Wt 135.6 kg   BMI 51.32 kg/m   Physical Exam  Constitutional: She is oriented to person, place, and time. She appears well-developed and well-nourished. No distress.  Patient in NAD. Central obesity noted.] Exam is limited due to body habitus. Moon facies and appearance of cushing's or chronic steroid use.  HENT:  Head: Normocephalic and atraumatic.  Eyes: Conjunctivae are normal. No scleral icterus.  Neck: Normal range of motion.  Cardiovascular: Normal rate and regular rhythm. Exam reveals no friction rub.  No murmur heard. Heart sounds are distant  Pulmonary/Chest: Effort normal and breath sounds normal. No  respiratory distress.  Breath sounds are difficult to auscultate secondary to body habitus  Abdominal: Soft. Bowel sounds are normal. She exhibits no distension and no mass. There is no tenderness. There is no guarding.  Musculoskeletal:       Right lower leg: She exhibits no edema.       Left lower leg: She exhibits no edema.  Neurological: She is alert and oriented to person, place, and time.  Skin: Skin is warm and dry. She is not diaphoretic.  Psychiatric: Her behavior is normal.  Nursing note and vitals reviewed.    ED Treatments / Results  Labs (all labs ordered are listed, but only abnormal results are displayed) Labs Reviewed  BASIC METABOLIC PANEL  CBC  TROPONIN I  BRAIN NATRIURETIC PEPTIDE    EKG EKG Interpretation  Date/Time:  Sunday July 16 2018 21:05:03 EDT Ventricular Rate:  75 PR Interval:  194 QRS Duration: 94 QT Interval:  446 QTC Calculation: 498 R Axis:   55 Text Interpretation:  Normal sinus rhythm Cannot rule out Anterior infarct , age undetermined No significant change since last tracing 03 Jul 2018 Confirmed by Devoria Albe ([id number]) on 07/16/2018 11:22:45 PM   Radiology Dg Chest 2 View  Result Date: 07/16/2018 CLINICAL DATA:  Shortness of Breath EXAM: CHEST - 2 VIEW COMPARISON:  07/13/2017 FINDINGS: Cardiomegaly. Diffuse interstitial opacities throughout the lungs, likely interstitial edema. Small bilateral effusions. No acute bony abnormality. IMPRESSION: Cardiomegaly with diffuse interstitial opacities, likely edema/CHF. Small effusions. Electronically Signed   By: Charlett Nose M.D.   On: 07/16/2018 22:23    Procedures Procedures (including critical care time)  Medications Ordered in ED Medications - No data to display   Initial Impression / Assessment and Plan / ED Course  I have reviewed the triage vital signs and the nursing notes.  Pertinent labs & imaging results that were available during my care of the patient were reviewed by me  and considered in my medical decision making (see chart for details).  Clinical Course as of Jul 17 122  Mon Jul 17, 2018  0029 Patient heart score is 6 making her moderate risk for MACE   [AH]  0031 I have reviewed the patient's xray which shows some edema, however clinically the patient is able to lie near flat without increased work of breathing.    [AH]  0032 BMP is near baseline   [AH]  0033 Patient's hgb appears to have dropped by 3 grams in the past 13 days. She denies melena or hematochezia.  Hemoglobin(!): 10.0 [AH]  0106 I went to reevaluate the patient who is lying on her side in the bed and was appearing tachypneic with labored breathing.  I had the patient sit up on the side of the bed and her dyspnea improved after a minute or 2.  As she is looking to have a clinical appearance that is more consistent with a CHF exacerbation.  Her last echocardiogram occurred in 2011.  Although her BNP is low she is morbidly obese, which can skew the patient's BNP result.  I discussed the case with Dr. Devoria Albe who agrees that the patient needs inpatient cardiology work-up.  Patient will be placed on IV Lasix.  B Natriuretic Peptide: 73.0 [AH]    Clinical Course User Index [AH] Arthor Captain, PA-C    Patient with CHF exacerbation and chest pain.  She is moderate risk and her heart score for major adverse cardiac events.  Patient's EKG shows no significant or acute arrhythmias or ischemic abnormalities.  Personally reviewed the patient's labs, her PA and lateral chest film which shows some mild infiltrate consistent with pulmonary edema.  Patient will be admitted to the hospitalist service.  Final Clinical Impressions(s) / ED Diagnoses   Final diagnoses:  Chest pain with moderate risk for cardiac etiology  Acute pulmonary edema [facility])    ED  Discharge Orders    None       Arthor Captain, PA-C 07/18/18 0014    Devoria Albe, MD 07/25/18 2300

## 2018-07-16 NOTE — ED Triage Notes (Signed)
Pt reports SOB and chest pain that started about an hour ago. Pt says she is out of asthma medication and her symptoms get worse with exertion. Pt reports nasal congestion and sinus pressure.

## 2018-07-17 ENCOUNTER — Observation Stay (HOSPITAL_BASED_OUTPATIENT_CLINIC_OR_DEPARTMENT_OTHER): Payer: Medicare HMO

## 2018-07-17 DIAGNOSIS — R079 Chest pain, unspecified: Secondary | ICD-10-CM

## 2018-07-17 DIAGNOSIS — F319 Bipolar disorder, unspecified: Secondary | ICD-10-CM | POA: Diagnosis not present

## 2018-07-17 DIAGNOSIS — R0789 Other chest pain: Secondary | ICD-10-CM | POA: Diagnosis not present

## 2018-07-17 DIAGNOSIS — E785 Hyperlipidemia, unspecified: Secondary | ICD-10-CM

## 2018-07-17 DIAGNOSIS — I5030 Unspecified diastolic (congestive) heart failure: Secondary | ICD-10-CM

## 2018-07-17 DIAGNOSIS — J45909 Unspecified asthma, uncomplicated: Secondary | ICD-10-CM

## 2018-07-17 DIAGNOSIS — I10 Essential (primary) hypertension: Secondary | ICD-10-CM | POA: Diagnosis not present

## 2018-07-17 DIAGNOSIS — I509 Heart failure, unspecified: Secondary | ICD-10-CM

## 2018-07-17 DIAGNOSIS — R0602 Shortness of breath: Secondary | ICD-10-CM | POA: Diagnosis not present

## 2018-07-17 DIAGNOSIS — E119 Type 2 diabetes mellitus without complications: Secondary | ICD-10-CM

## 2018-07-17 DIAGNOSIS — J810 Acute pulmonary edema: Secondary | ICD-10-CM | POA: Diagnosis not present

## 2018-07-17 DIAGNOSIS — Z23 Encounter for immunization: Secondary | ICD-10-CM | POA: Diagnosis not present

## 2018-07-17 DIAGNOSIS — E669 Obesity, unspecified: Secondary | ICD-10-CM | POA: Diagnosis not present

## 2018-07-17 LAB — BASIC METABOLIC PANEL
ANION GAP: 8 (ref 5–15)
BUN: 32 mg/dL — ABNORMAL HIGH (ref 6–20)
CALCIUM: 8.7 mg/dL — AB (ref 8.9–10.3)
CO2: 24 mmol/L (ref 22–32)
CREATININE: 1.32 mg/dL — AB (ref 0.44–1.00)
Chloride: 108 mmol/L (ref 98–111)
GFR, EST AFRICAN AMERICAN: 50 mL/min — AB (ref >60)
GFR, EST NON AFRICAN AMERICAN: 44 mL/min — AB (ref >60)
GLUCOSE: 278 mg/dL — AB (ref 70–99)
Potassium: 3.4 mmol/L — ABNORMAL LOW (ref 3.5–5.1)
Sodium: 140 mmol/L (ref 135–145)

## 2018-07-17 LAB — COMPREHENSIVE METABOLIC PANEL
ALBUMIN: 3 g/dL — AB (ref 3.5–5.0)
ALT: 38 U/L (ref 0–44)
AST: 22 U/L (ref 15–41)
Alkaline Phosphatase: 75 U/L (ref 38–126)
Anion gap: 11 (ref 5–15)
BILIRUBIN TOTAL: 0.6 mg/dL (ref 0.3–1.2)
BUN: 31 mg/dL — AB (ref 6–20)
CO2: 22 mmol/L (ref 22–32)
Calcium: 8.8 mg/dL — ABNORMAL LOW (ref 8.9–10.3)
Chloride: 107 mmol/L (ref 98–111)
Creatinine, Ser: 1.17 mg/dL — ABNORMAL HIGH (ref 0.44–1.00)
GFR calc Af Amer: 58 mL/min — ABNORMAL LOW (ref >60)
GFR calc non Af Amer: 50 mL/min — ABNORMAL LOW (ref >60)
GLUCOSE: 205 mg/dL — AB (ref 70–99)
POTASSIUM: 3.4 mmol/L — AB (ref 3.5–5.1)
Sodium: 140 mmol/L (ref 135–145)
Total Protein: 6.8 g/dL (ref 6.5–8.1)

## 2018-07-17 LAB — CBC
HEMATOCRIT: 31.1 % — AB (ref 36.0–46.0)
Hemoglobin: 9.7 g/dL — ABNORMAL LOW (ref 12.0–15.0)
MCH: 28.1 pg (ref 26.0–34.0)
MCHC: 31.2 g/dL (ref 30.0–36.0)
MCV: 90.1 fL (ref 80.0–100.0)
NRBC: 0 % (ref 0.0–0.2)
PLATELETS: 285 10*3/uL (ref 150–400)
RBC: 3.45 MIL/uL — ABNORMAL LOW (ref 3.87–5.11)
RDW: 12.4 % (ref 11.5–15.5)
WBC: 5.7 10*3/uL (ref 4.0–10.5)

## 2018-07-17 LAB — TROPONIN I
Troponin I: <0.03 ng/mL (ref <0.03)
Troponin I: <0.03 ng/mL (ref <0.03)
Troponin I: <0.03 ng/mL (ref <0.03)

## 2018-07-17 LAB — GLUCOSE, CAPILLARY
GLUCOSE-CAPILLARY: 186 mg/dL — AB (ref 70–99)
GLUCOSE-CAPILLARY: 233 mg/dL — AB (ref 70–99)
Glucose-Capillary: 209 mg/dL — ABNORMAL HIGH (ref 70–99)
Glucose-Capillary: 215 mg/dL — ABNORMAL HIGH (ref 70–99)
Glucose-Capillary: 204 mg/dL — ABNORMAL HIGH (ref 70–99)

## 2018-07-17 LAB — ECHOCARDIOGRAM COMPLETE
Height: 64 in
Weight: 5403.92 oz

## 2018-07-17 LAB — OCCULT BLOOD, POC DEVICE: FECAL OCCULT BLD: NEGATIVE

## 2018-07-17 LAB — BRAIN NATRIURETIC PEPTIDE: B NATRIURETIC PEPTIDE 5: 73 pg/mL (ref 0.0–100.0)

## 2018-07-17 MED ORDER — IPRATROPIUM-ALBUTEROL 0.5-2.5 (3) MG/3ML IN SOLN
3.0000 mL | Freq: Four times a day (QID) | RESPIRATORY_TRACT | Status: DC
  Administered 2018-07-17 – 2018-07-18 (×5): 3 mL via RESPIRATORY_TRACT
  Filled 2018-07-17 (×5): qty 3

## 2018-07-17 MED ORDER — SIMETHICONE 80 MG PO CHEW
80.0000 mg | CHEWABLE_TABLET | Freq: Four times a day (QID) | ORAL | Status: AC
  Administered 2018-07-17 (×4): 80 mg via ORAL
  Filled 2018-07-17 (×4): qty 1

## 2018-07-17 MED ORDER — ALUM & MAG HYDROXIDE-SIMETH 200-200-20 MG/5ML PO SUSP
30.0000 mL | Freq: Once | ORAL | Status: AC
  Administered 2018-07-17: 30 mL via ORAL
  Filled 2018-07-17: qty 30

## 2018-07-17 MED ORDER — ONDANSETRON HCL 4 MG PO TABS: 4.0000 mg | ORAL_TABLET | Freq: Four times a day (QID) | ORAL | Status: DC | PRN

## 2018-07-17 MED ORDER — NITROGLYCERIN 0.4 MG SL SUBL
0.4000 mg | SUBLINGUAL_TABLET | SUBLINGUAL | Status: DC | PRN
  Administered 2018-07-17 (×3): 0.4 mg via SUBLINGUAL
  Filled 2018-07-17 (×3): qty 1

## 2018-07-17 MED ORDER — ACETAMINOPHEN 325 MG PO TABS: 650.0000 mg | ORAL_TABLET | Freq: Four times a day (QID) | ORAL | Status: DC | PRN

## 2018-07-17 MED ORDER — ENOXAPARIN SODIUM 80 MG/0.8ML ~~LOC~~ SOLN
80.0000 mg | SUBCUTANEOUS | Status: DC
  Administered 2018-07-18: 80 mg via SUBCUTANEOUS
  Filled 2018-07-17: qty 0.8

## 2018-07-17 MED ORDER — RISPERIDONE 1 MG PO TABS
2.0000 mg | ORAL_TABLET | Freq: Every day | ORAL | Status: DC
  Administered 2018-07-17: 2 mg via ORAL
  Filled 2018-07-17: qty 2

## 2018-07-17 MED ORDER — INSULIN GLARGINE 100 UNIT/ML ~~LOC~~ SOLN
85.0000 [IU] | Freq: Two times a day (BID) | SUBCUTANEOUS | Status: DC
  Administered 2018-07-17 – 2018-07-18 (×3): 85 [IU] via SUBCUTANEOUS
  Filled 2018-07-17 (×5): qty 0.85

## 2018-07-17 MED ORDER — ENOXAPARIN SODIUM 40 MG/0.4ML ~~LOC~~ SOLN
40.0000 mg | SUBCUTANEOUS | Status: DC
  Administered 2018-07-17: 40 mg via SUBCUTANEOUS
  Filled 2018-07-17: qty 0.4

## 2018-07-17 MED ORDER — FUROSEMIDE 10 MG/ML IJ SOLN
60.0000 mg | Freq: Once | INTRAMUSCULAR | Status: AC
  Administered 2018-07-17: 60 mg via INTRAVENOUS
  Filled 2018-07-17: qty 6

## 2018-07-17 MED ORDER — TRAZODONE HCL 50 MG PO TABS: 50.0000 mg | ORAL_TABLET | Freq: Every evening | ORAL | Status: DC | PRN

## 2018-07-17 MED ORDER — REGADENOSON 0.4 MG/5ML IV SOLN
0.4000 mg | Freq: Once | INTRAVENOUS | Status: AC
  Administered 2018-07-18: 0.4 mg via INTRAVENOUS
  Filled 2018-07-17: qty 5

## 2018-07-17 MED ORDER — ATORVASTATIN CALCIUM 40 MG PO TABS
80.0000 mg | ORAL_TABLET | Freq: Every morning | ORAL | Status: DC
  Administered 2018-07-17 – 2018-07-18 (×2): 80 mg via ORAL
  Filled 2018-07-17 (×2): qty 2

## 2018-07-17 MED ORDER — NITROGLYCERIN 2 % TD OINT
1.0000 [in_us] | TOPICAL_OINTMENT | Freq: Once | TRANSDERMAL | Status: AC
  Administered 2018-07-17: 1 [in_us] via TOPICAL
  Filled 2018-07-17: qty 1

## 2018-07-17 MED ORDER — LIDOCAINE VISCOUS HCL 2 % MT SOLN
15.0000 mL | Freq: Once | OROMUCOSAL | Status: AC
  Administered 2018-07-17: 15 mL via ORAL
  Filled 2018-07-17: qty 15

## 2018-07-17 MED ORDER — ACETAMINOPHEN 650 MG RE SUPP: 650.0000 mg | Freq: Four times a day (QID) | RECTAL | Status: DC | PRN

## 2018-07-17 MED ORDER — ESCITALOPRAM OXALATE 10 MG PO TABS
20.0000 mg | ORAL_TABLET | Freq: Two times a day (BID) | ORAL | Status: DC
  Administered 2018-07-17 – 2018-07-18 (×3): 20 mg via ORAL
  Filled 2018-07-17 (×3): qty 2

## 2018-07-17 MED ORDER — ONDANSETRON HCL 4 MG/2ML IJ SOLN: 4.0000 mg | Freq: Four times a day (QID) | INTRAMUSCULAR | Status: DC | PRN

## 2018-07-17 MED ORDER — RISPERIDONE 1 MG PO TABS
1.0000 mg | ORAL_TABLET | ORAL | Status: DC
  Administered 2018-07-17: 1 mg via ORAL
  Filled 2018-07-17: qty 1

## 2018-07-17 MED ORDER — LEVOTHYROXINE SODIUM 50 MCG PO TABS
50.0000 ug | ORAL_TABLET | Freq: Every day | ORAL | Status: DC
  Administered 2018-07-17: 50 ug via ORAL
  Filled 2018-07-17: qty 1

## 2018-07-17 MED ORDER — PNEUMOCOCCAL VAC POLYVALENT 25 MCG/0.5ML IJ INJ
0.5000 mL | INJECTION | INTRAMUSCULAR | Status: AC
  Administered 2018-07-18: 0.5 mL via INTRAMUSCULAR
  Filled 2018-07-17: qty 0.5

## 2018-07-17 MED ORDER — INSULIN ASPART 100 UNIT/ML ~~LOC~~ SOLN
0.0000 [IU] | Freq: Three times a day (TID) | SUBCUTANEOUS | Status: DC
  Administered 2018-07-17: 2 [IU] via SUBCUTANEOUS
  Administered 2018-07-17 (×2): 3 [IU] via SUBCUTANEOUS
  Administered 2018-07-18: 1 [IU] via SUBCUTANEOUS

## 2018-07-17 MED ORDER — INFLUENZA VAC SPLIT QUAD 0.5 ML IM SUSY
0.5000 mL | PREFILLED_SYRINGE | INTRAMUSCULAR | Status: AC
  Administered 2018-07-18: 0.5 mL via INTRAMUSCULAR
  Filled 2018-07-17: qty 0.5

## 2018-07-17 MED ORDER — AMLODIPINE BESYLATE 5 MG PO TABS
10.0000 mg | ORAL_TABLET | Freq: Every day | ORAL | Status: DC
  Administered 2018-07-17 – 2018-07-18 (×2): 10 mg via ORAL
  Filled 2018-07-17 (×2): qty 2

## 2018-07-17 MED ORDER — SODIUM CHLORIDE 0.9 % IV SOLN: INTRAVENOUS | Status: DC

## 2018-07-17 NOTE — Progress Notes (Signed)
*  PRELIMINARY RESULTS* Echocardiogram 2D Echocardiogram has been performed.  Jeryl Columbia 07/17/2018, 10:35 AM

## 2018-07-17 NOTE — Progress Notes (Signed)
Patient stated she had chest pain and needed  A nitro was administered. Vital signs:BP 121/72, HR 82, O2 96, temp. 98.9, resp 20. Administered 2l O2

## 2018-07-17 NOTE — H&P (Signed)
TRH H&P    Patient Demographics:    [name], is a 57 y.o. female  MRN: [id number]  DOB - [birth year]  Admit Date - 07/16/2018  Referring MD/NP/PA: Vernie Shanks  Outpatient Primary MD for the patient is Christain Sacramento, MD  Patient coming from: Home  Chief complaint-chest pain, shortness of breath   HPI:    [name]  is a 57 y.o. female, with history of asthma, hypertension, diabetes mellitus type 2, hyperlipidemia, bipolar disorder, hypothyroidism came to hospital with complaints of chest pain and shortness of breath on exertion.  Patient has asthma and has been out of her albuterol tablets and inhaler.  She is not requiring oxygen in the ED. Denies coughing up any phlegm. Denies fever or chills. No nausea vomiting or diarrhea. No abdominal pain. No history of orthopnea or PND No previous history of stroke or seizures.  In the ED, chest x-ray showed pulmonary edema.  She was given Lasix 60 mg IV x1 and started on nitroglycerin 15 mg patch x1      Review of systems:    In addition to the HPI above,    All other systems reviewed and are negative.   With Past History of the following :    Past Medical History:  Diagnosis Date  . Asthma   . Back pain   . Chest pain   . Diabetes mellitus type II   . Headache   . High cholesterol   . Hypertension   . Obesity, morbid ([place])   . Other and unspecified bipolar disorders   . 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 Thyroid disease       Past Surgical History:  Procedure Laterality Date  . TUBAL LIGATION     Bilateral      Social History:      Social History   Tobacco Use  . Smoking status: Never Smoker  . Smokeless tobacco: Never Used  Substance Use Topics  . Alcohol use: No    Alcohol/week: 0.0 standard drinks       Family History :     Family History  Problem Relation Age of Onset  . Heart attack Maternal Uncle   .  Depression Maternal Uncle   . Depression Mother   . Alcohol abuse Maternal Grandfather   . Alcohol abuse Maternal Uncle   . Depression Maternal Grandmother       Home Medications:   Prior to Admission medications   Medication Sig Start Date End Date Taking? Authorizing Provider  albuterol (PROVENTIL HFA;VENTOLIN HFA) 108 (90 Base) MCG/ACT inhaler Inhale 2 puffs into the lungs every 6 (six) hours as needed for wheezing or shortness of breath. 07/07/18   Money, Lowry Ram, FNP  amLODipine (NORVASC) 10 MG tablet Take 1 tablet (10 mg total) by mouth daily. For high blood pressure 07/07/18   Money, Lowry Ram, FNP  atorvastatin (LIPITOR) 80 MG tablet Take 1 tablet (80 mg total) by mouth every morning. 07/07/18   Money, Lowry Ram, FNP  escitalopram (LEXAPRO) 20 MG tablet Take 1 tablet (  20 mg total) by mouth 2 (two) times daily. for mood control 07/10/18   Cloria Spring, MD  glipiZIDE (GLUCOTROL) 10 MG tablet Take 1 tablet (10 mg total) by mouth 2 (two) times daily before a meal. 07/07/18   Money, Lowry Ram, FNP  hydrOXYzine (ATARAX/VISTARIL) 50 MG tablet Take 1 tablet (50 mg total) by mouth every 6 (six) hours as needed for anxiety. 07/10/18   Cloria Spring, MD  insulin aspart (NOVOLOG) 100 UNIT/ML injection Inject 10 Units into the skin 3 (three) times daily with meals. 07/07/18   Money, Lowry Ram, FNP  insulin glargine (LANTUS) 100 UNIT/ML injection Inject 0.85 mLs (85 Units total) into the skin 2 (two) times daily. 07/07/18   Money, Lowry Ram, FNP  levothyroxine (SYNTHROID, LEVOTHROID) 50 MCG tablet Take 1 tablet (50 mcg total) by mouth daily before breakfast. For hypothyroidism 07/07/18   Money, Lowry Ram, FNP  risperiDONE (RISPERDAL) 1 MG tablet Take one in the am and two at bedtime 07/10/18   Cloria Spring, MD  traZODone (DESYREL) 50 MG tablet Take 1 tablet (50 mg total) by mouth at bedtime as needed for sleep. 07/10/18   Cloria Spring, MD     Allergies:     Allergies  Allergen Reactions    . Amoxicillin Swelling and Other (See Comments)    Oral swelling   . Doxycycline Nausea And Vomiting  . Levofloxacin Other (See Comments)    headache  . Propoxyphene N-Acetaminophen Nausea And Vomiting     Physical Exam:   Vitals  Blood pressure (!) 159/75, pulse 81, temperature 98.1 F (36.7 C), temperature source Oral, resp. rate (!) 21, height 5' 4"  (1.626 m), weight 135.6 kg, SpO2 97 %.  1.  General: Appears in no acute distress  2. Psychiatric:  Intact judgement and  insight, awake alert, oriented x 3.  3. Neurologic: No focal neurological deficits, all cranial nerves intact.Strength 5/5 all 4 extremities, sensation intact all 4 extremities, plantars down going.  4. Eyes :  anicteric sclerae, moist conjunctivae with no lid lag. PERRLA.  5. ENMT:  Oropharynx clear with moist mucous membranes and good dentition  6. Neck:  supple, no cervical lymphadenopathy appriciated, No thyromegaly  7. Respiratory : Normal respiratory effort, good air movement bilaterally,clear to  auscultation bilaterally  8. Cardiovascular : RRR, no gallops, rubs or murmurs, no leg edema  9. Gastrointestinal:  Positive bowel sounds, abdomen soft, non-tender to palpation,no hepatosplenomegaly, no rigidity or guarding       10. Skin:  No cyanosis, normal texture and turgor, no rash, lesions or ulcers  11.Musculoskeletal:  Good muscle tone,  joints appear normal , no effusions,  normal range of motion    Data Review:    CBC Recent Labs  Lab 07/16/18 2323  WBC 6.6  HGB 10.0*  HCT 31.9*  PLT 281  MCV 88.9  MCH 27.9  MCHC 31.3  RDW 12.4   ------------------------------------------------------------------------------------------------------------------  Results for orders placed or performed during the hospital encounter of 07/16/18 (from the past 48 hour(s))  Basic metabolic panel     Status: Abnormal   Collection Time: 07/16/18 11:23 PM  Result Value Ref Range   Sodium 140  135 - 145 mmol/L   Potassium 3.4 (L) 3.5 - 5.1 mmol/L   Chloride 108 98 - 111 mmol/L   CO2 24 22 - 32 mmol/L   Glucose, Bld 278 (H) 70 - 99 mg/dL   BUN 32 (H) 6 - 20 mg/dL  Creatinine, Ser 1.32 (H) 0.44 - 1.00 mg/dL   Calcium 8.7 (L) 8.9 - 10.3 mg/dL   GFR calc non Af Amer 44 (L) >60 mL/min   GFR calc Af Amer 50 (L) >60 mL/min    Comment: (NOTE) The eGFR has been calculated using the CKD EPI equation. This calculation has not been validated in all clinical situations. eGFR's persistently <60 mL/min signify possible Chronic Kidney Disease.    Anion gap 8 5 - 15    Comment: Performed at [facility], [address], [place], [place] [address]  CBC     Status: Abnormal   Collection Time: 07/16/18 11:23 PM  Result Value Ref Range   WBC 6.6 4.0 - 10.5 K/uL   RBC 3.59 (L) 3.87 - 5.11 MIL/uL   Hemoglobin 10.0 (L) 12.0 - 15.0 g/dL   HCT 31.9 (L) 36.0 - 46.0 %   MCV 88.9 80.0 - 100.0 fL   MCH 27.9 26.0 - 34.0 pg   MCHC 31.3 30.0 - 36.0 g/dL   RDW 12.4 11.5 - 15.5 %   Platelets 281 150 - 400 K/uL   nRBC 0.0 0.0 - 0.2 %    Comment: Performed at [facility], [address] [place], [place] [address]  Troponin I     Status: None   Collection Time: 07/16/18 11:23 PM  Result Value Ref Range   Troponin I <0.03 <0.03 ng/mL    Comment: Performed at [facility], [address], [place], [place] [address]  Brain natriuretic peptide     Status: None   Collection Time: 07/16/18 11:24 PM  Result Value Ref Range   B Natriuretic Peptide 73.0 0.0 - 100.0 pg/mL    Comment: Performed at [facility], [address] [place], [place] [address]    Chemistries  Recent Labs  Lab 07/16/18 2323  NA 140  K 3.4*  CL 108  CO2 24  GLUCOSE 278*  BUN 32*  CREATININE 1.32*  CALCIUM 8.7*    ------------------------------------------------------------------------------------------------------------------  ------------------------------------------------------------------------------------------------------------------ GFR: Estimated Creatinine Clearance: 63.9 mL/min (A) (by C-G formula based on SCr of 1.32 mg/dL (H)). Liver Function Tests: No results for input(s): AST, ALT, ALKPHOS, BILITOT, PROT, ALBUMIN in the last 168 hours. No results for input(s): LIPASE, AMYLASE in the last 168 hours. No results for input(s): AMMONIA in the last 168 hours. Coagulation Profile: No results for input(s): INR, PROTIME in the last 168 hours. Cardiac Enzymes: Recent Labs  Lab 07/16/18 2323  TROPONINI <0.03   BNP (last 3 results) No results for input(s): PROBNP in the last 8760 hours. HbA1C: No results for input(s): HGBA1C in the last 72 hours. CBG: No results for input(s): GLUCAP in the last 168 hours. Lipid Profile: No results for input(s): CHOL, HDL, LDLCALC, TRIG, CHOLHDL, LDLDIRECT in the last 72 hours. Thyroid Function Tests: No results for input(s): TSH, T4TOTAL, FREET4, T3FREE, THYROIDAB in the last 72 hours. Anemia Panel: No results for input(s): VITAMINB12, FOLATE, FERRITIN, TIBC, IRON, RETICCTPCT in the last 72 hours.  --------------------------------------------------------------------------------------------------------------- Urine analysis:    Component Value Date/Time   COLORURINE YELLOW 07/03/2018 1435   APPEARANCEUR HAZY (A) 07/03/2018 1435   LABSPEC 1.027 07/03/2018 1435   PHURINE 5.0 07/03/2018 1435   GLUCOSEU >=500 (A) 07/03/2018 1435   HGBUR NEGATIVE 07/03/2018 1435   BILIRUBINUR NEGATIVE 07/03/2018 1435   KETONESUR NEGATIVE 07/03/2018 1435   PROTEINUR NEGATIVE 07/03/2018 1435   UROBILINOGEN 0.2 09/09/2010 0130   NITRITE NEGATIVE 07/03/2018 1435   LEUKOCYTESUR NEGATIVE 07/03/2018 1435  Imaging Results:    Dg Chest 2 View  Result Date:  07/16/2018 CLINICAL DATA:  Shortness of Breath EXAM: CHEST - 2 VIEW COMPARISON:  07/13/2017 FINDINGS: Cardiomegaly. Diffuse interstitial opacities throughout the lungs, likely interstitial edema. Small bilateral effusions. No acute bony abnormality. IMPRESSION: Cardiomegaly with diffuse interstitial opacities, likely edema/CHF. Small effusions. Electronically Signed   By: Rolm Baptise M.D.   On: 07/16/2018 22:23    My personal review of EKG: Rhythm NSR   Assessment & Plan:    Active Problems:   Chest pain   1. Chest pain-patient has significant risk factors for CAD, will obtain serial troponin every 6 hours x3.  2. Dyspnea on exertion-patient's BNP is only 73, she received Lasix 60 mg IV in the ED for possible CHF seen on chest x-ray.  Clinically she appears dry, will follow BMP in a.m.Would not give more Lasix, if renal function starts to become worse.  We will also obtain echocardiogram in a.m.  3. Acute kidney injury-patient's creatinine is 1.32, last creatinine from 07/03/2018 was 1.06.  Follow BMP in am.  Might need IV fluids if kidney function gets worse with IV Lasix.  4. Diabetes mellitus-continue Lantus, start sliding scale insulin with NovoLog  5. History of asthma-start duo nebs every 6 hours   DVT Prophylaxis-   Lovenox   AM Labs Ordered, also please review Full Orders  Family Communication: Admission, patients condition and plan of care including tests being ordered have been discussed with the patient and  who indicate understanding and agree with the plan and Code Status.  Code Status: Full code  Admission status: Observation  Time spent in minutes : 60 minutes   Oswald Hillock M.D on 07/17/2018 at 2:02 AM  Between 7am to 7pm - Pager - [phone]. After 7pm go to [email or web address] - password [facility]  [facility] - Office  [phone]

## 2018-07-17 NOTE — Care Management Obs Status (Signed)
MEDICARE OBSERVATION STATUS NOTIFICATION   Patient Details  Name: [name] MRN: [id number] Date of Birth: 10-Feb-1961   Medicare Observation Status Notification Given:  Yes    Renie Ora 07/17/2018, 11:44 AM

## 2018-07-17 NOTE — ED Notes (Signed)
Pt completed orthostatic vital signs without difficulty

## 2018-07-17 NOTE — ED Notes (Signed)
Pt successfully ambulated >96% on room air

## 2018-07-17 NOTE — Care Management (Signed)
Per Drenda Freeze with [employer],Albuterol inhaler(generiic) no covered under her plan. Ventolin HSA 90 mcg. Is covered 30 day supply $45.00, 90 day supply $125.00,No PA required.

## 2018-07-17 NOTE — Clinical Social Work Note (Signed)
CSW received consult for inability to afford medical equipment. This is a CM function. CM aware and will address needs as resources permit.    Yarah Fuente, Juleen China, LCSW

## 2018-07-17 NOTE — Progress Notes (Signed)
**Note De-identified  Obfuscation** STAT EKG complete; RN notified and placed in patient chart 

## 2018-07-17 NOTE — Consult Note (Addendum)
Cardiology Consultation:   Patient ID: [name] MRN: [id number]; DOB: [birth year]  Admit date: 07/16/2018 Date of Consult: 07/17/2018  Primary Care Provider: Barbie Banner, MD Primary Cardiologist: Prentice Docker, MD  Primary Electrophysiologist:  None    Patient Profile:   [name] is a 57 y.o. female with a hx of HTN, DM, HLD who is being seen today for the evaluation of chest pain at the request of Dr. Sharl Ma.  History of Present Illness:   [name] with history of HTN, DM type 2, HLD, biporlar disorder, asthma. Patient ran out of her albuterol tablets and inhalers and presented with chest pain and exertional shortness of breth. CXR showed diffuse opacities with ? CHF. BNP 73. She was given Lasix 60 mg IV in ER. Troponin negative x2. K 3.4 Crt 1.17, hgb 9.7.   Patient says she got up to get a drink of water and had chest tightness and shortness of breath.Thought she was wheezing. Never had this bad before. Doesn't think lasix made a difference but NTG paste seemed to help. Having more tightness this am,easing with NTG SL. No regular exercise. Was losing weight but admitted to behavioral health and meds changed. Says she gained 23 lbs in 1 week. Feels bloated whenever she eats or drinks anything. Weight 135 kg yest 153 today?? GF had MI at 65.   Past Medical History:  Diagnosis Date  . Asthma   . Back pain   . Chest pain   . Diabetes mellitus type II   . Headache   . High cholesterol   . Hypertension   . Obesity, morbid (HCC)   . Other and unspecified bipolar disorders   . 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 Thyroid disease     Past Surgical History:  Procedure Laterality Date  . TUBAL LIGATION     Bilateral     Home Medications:  Prior to Admission medications   Medication Sig Start Date End Date Taking? Authorizing Provider  albuterol (PROVENTIL HFA;VENTOLIN HFA) 108 (90 Base) MCG/ACT inhaler Inhale 2 puffs into the lungs every 6 (six) hours as  needed for wheezing or shortness of breath. 07/07/18   Money, Gerlene Burdock, FNP  amLODipine (NORVASC) 10 MG tablet Take 1 tablet (10 mg total) by mouth daily. For high blood pressure 07/07/18   Money, Gerlene Burdock, FNP  atorvastatin (LIPITOR) 80 MG tablet Take 1 tablet (80 mg total) by mouth every morning. 07/07/18   Money, Gerlene Burdock, FNP  escitalopram (LEXAPRO) 20 MG tablet Take 1 tablet (20 mg total) by mouth 2 (two) times daily. for mood control 07/10/18   Myrlene Broker, MD  glipiZIDE (GLUCOTROL) 10 MG tablet Take 1 tablet (10 mg total) by mouth 2 (two) times daily before a meal. 07/07/18   Money, Gerlene Burdock, FNP  hydrOXYzine (ATARAX/VISTARIL) 50 MG tablet Take 1 tablet (50 mg total) by mouth every 6 (six) hours as needed for anxiety. 07/10/18   Myrlene Broker, MD  insulin aspart (NOVOLOG) 100 UNIT/ML injection Inject 10 Units into the skin 3 (three) times daily with meals. 07/07/18   Money, Gerlene Burdock, FNP  insulin glargine (LANTUS) 100 UNIT/ML injection Inject 0.85 mLs (85 Units total) into the skin 2 (two) times daily. 07/07/18   Money, Gerlene Burdock, FNP  levothyroxine (SYNTHROID, LEVOTHROID) 50 MCG tablet Take 1 tablet (50 mcg total) by mouth daily before breakfast. For hypothyroidism 07/07/18   Money, Gerlene Burdock, FNP  risperiDONE (RISPERDAL) 1 MG tablet Take one in  the am and two at bedtime 07/10/18   Myrlene Broker, MD  traZODone (DESYREL) 50 MG tablet Take 1 tablet (50 mg total) by mouth at bedtime as needed for sleep. 07/10/18   Myrlene Broker, MD    Inpatient Medications: Scheduled Meds: . amLODipine  10 mg Oral Daily  . atorvastatin  80 mg Oral q morning - 10a  . enoxaparin (LOVENOX) injection  40 mg Subcutaneous Q24H  . escitalopram  20 mg Oral BID  . [START ON 07/18/2018] Influenza vac split quadrivalent PF  0.5 mL Intramuscular Tomorrow-1000  . insulin aspart  0-9 Units Subcutaneous TID WC  . insulin glargine  85 Units Subcutaneous BID  . ipratropium-albuterol  3 mL Nebulization Q6H  .  levothyroxine  50 mcg Oral Q0600  . [START ON 07/18/2018] pneumococcal 23 valent vaccine  0.5 mL Intramuscular Tomorrow-1000  . risperiDONE  1 mg Oral BH-q7a  . risperiDONE  2 mg Oral QHS  . simethicone  80 mg Oral QID   Continuous Infusions: . sodium chloride Stopped (07/17/18 0327)   PRN Meds: acetaminophen **OR** acetaminophen, nitroGLYCERIN, ondansetron **OR** ondansetron (ZOFRAN) IV, traZODone  Allergies:    Allergies  Allergen Reactions  . Amoxicillin Swelling and Other (See Comments)    Oral swelling   . Doxycycline Nausea And Vomiting  . Levofloxacin Other (See Comments)    headache  . Propoxyphene N-Acetaminophen Nausea And Vomiting    Social History:   Social History   Socioeconomic History  . Marital status: Widowed    Spouse name: Not on file  . Number of children: Not on file  . Years of education: Not on file  . Highest education level: Not on file  Occupational History  . Occupation: Disabled    Associate Professor: UNEMPLOYED  Social Needs  . Financial resource strain: Not on file  . Food insecurity:    Worry: Not on file    Inability: Not on file  . Transportation needs:    Medical: Not on file    Non-medical: Not on file  Tobacco Use  . Smoking status: Never Smoker  . Smokeless tobacco: Never Used  Substance and Sexual Activity  . Alcohol use: No    Alcohol/week: 0.0 standard drinks  . Drug use: No    Comment: 05/13/2016 per pt no  . Sexual activity: Yes    Birth control/protection: Surgical  Lifestyle  . Physical activity:    Days per week: Not on file    Minutes per session: Not on file  . Stress: Not on file  Relationships  . Social connections:    Talks on phone: Not on file    Gets together: Not on file    Attends religious service: Not on file    Active member of club or organization: Not on file    Attends meetings of clubs or organizations: Not on file    Relationship status: Not on file  . Intimate partner violence:    Fear of current  or ex partner: Not on file    Emotionally abused: Not on file    Physically abused: Not on file    Forced sexual activity: Not on file  Other Topics Concern  . Not on file  Social History Narrative   Married   No regular exercise    Family History:    Family History  Problem Relation Age of Onset  . Heart attack Maternal Uncle   . Depression Maternal Uncle   . Depression Mother   .  Alcohol abuse Maternal Grandfather   . Alcohol abuse Maternal Uncle   . Depression Maternal Grandmother      ROS:  Please see the history of present illness.  See HPI Review of Systems  Constitution: Positive for weight gain.  HENT: Negative.   Eyes: Negative.   Cardiovascular: Positive for chest pain, dyspnea on exertion and leg swelling.  Respiratory: Negative.   Hematologic/Lymphatic: Negative.   Musculoskeletal: Negative.  Negative for joint pain.  Gastrointestinal: Negative.   Genitourinary: Negative.   Neurological: Negative.   Psychiatric/Behavioral: Positive for depression. The patient is nervous/anxious.     All other ROS reviewed and negative.     Physical Exam/Data:   Vitals:   07/17/18 0244 07/17/18 0334 07/17/18 0628 07/17/18 0751  BP: 129/83  (!) 107/56   Pulse: 75  78   Resp:   19   Temp: 98.9 F (37.2 C)  97.8 F (36.6 C)   TempSrc: Oral  Oral   SpO2: 91%  95% 92%  Weight:  (!) 153.2 kg    Height:  5\' 4"  (1.626 m)      Intake/Output Summary (Last 24 hours) at 07/17/2018 0906 Last data filed at 07/17/2018 0700 Gross per 24 hour  Intake -  Output 1600 ml  Net -1600 ml   Filed Weights   07/16/18 2104 07/17/18 0334  Weight: 135.6 kg (!) 153.2 kg   Body mass index is 57.97 kg/m.  General:  Obese, in no acute distress HEENT: moon face normal Lymph: no adenopathy Neck: no JVD Endocrine:  No thryomegaly Vascular: No carotid bruits; FA pulses 2+ bilaterally without bruits  Cardiac:  normal S1, S2; RRR; 2/6 sys murmur LSB and apex Lungs:  Decreased breath  sounds but clear to auscultation bilaterally, no wheezing, rhonchi or rales  Abd: obese soft, nontender, no hepatomegaly  Ext: no edema Musculoskeletal:  No deformities, BUE and BLE strength normal and equal Skin: warm and dry  Neuro:  CNs 2-12 intact, no focal abnormalities noted Psych:  Normal affect   EKG:  The EKG was personally reviewed and demonstrates:  NSR with nonspecific ST changes, no change compared to EKG last yr. Telemetry:  Telemetry was personally reviewed and demonstrates:  NSR with PVC's  Relevant CV Studies:    Laboratory Data:  Chemistry Recent Labs  Lab 07/16/18 2323 07/17/18 0514  NA 140 140  K 3.4* 3.4*  CL 108 107  CO2 24 22  GLUCOSE 278* 205*  BUN 32* 31*  CREATININE 1.32* 1.17*  CALCIUM 8.7* 8.8*  GFRNONAA 44* 50*  GFRAA 50* 58*  ANIONGAP 8 11    Recent Labs  Lab 07/17/18 0514  PROT 6.8  ALBUMIN 3.0*  AST 22  ALT 38  ALKPHOS 75  BILITOT 0.6   Hematology Recent Labs  Lab 07/16/18 2323 07/17/18 0514  WBC 6.6 5.7  RBC 3.59* 3.45*  HGB 10.0* 9.7*  HCT 31.9* 31.1*  MCV 88.9 90.1  MCH 27.9 28.1  MCHC 31.3 31.2  RDW 12.4 12.4  PLT 281 285   Cardiac Enzymes Recent Labs  Lab 07/16/18 2323 07/17/18 0514  TROPONINI <0.03 <0.03   No results for input(s): TROPIPOC in the last 168 hours.  BNP Recent Labs  Lab 07/16/18 2324  BNP 73.0    DDimer No results for input(s): DDIMER in the last 168 hours.  Radiology/Studies:  Dg Chest 2 View  Result Date: 07/16/2018 CLINICAL DATA:  Shortness of Breath EXAM: CHEST - 2 VIEW COMPARISON:  07/13/2017 FINDINGS: Cardiomegaly.  Diffuse interstitial opacities throughout the lungs, likely interstitial edema. Small bilateral effusions. No acute bony abnormality. IMPRESSION: Cardiomegaly with diffuse interstitial opacities, likely edema/CHF. Small effusions. Electronically Signed   By: Charlett Nose M.D.   On: 07/16/2018 22:23    Assessment and Plan:   1. Chest pain and shortness of breath after  running out of inhalers. CXR with diffuse opacities and ? CHF but BNP 73. Troponins negative x 2. EKG without acute change. Multiple CV risk factors. Await echo. Could do stress test in am. 2. ? CHF-23 lb weight gain in 1 week after psych meds changed. Get echo for LV function. No CHF on exam. 3. Asthma-ran out of meds 4. HTN controlled on amlodipine 5. Hyperlipidemia on lipitor 6. DM type 2 on insulin 7. Bipolar-meds just changed at behavioral health with weight gain. 8. Anemia      For questions or updates, please contact [facility] Please consult [email or web address] for contact info under     Signed, Jacolyn Reedy, PA-C  07/17/2018 9:06 AM   The patient was seen and examined, and I agree with the history, physical exam, assessment and plan as documented above, with modifications as noted below. I have also personally reviewed all relevant documentation, old records, labs, and both radiographic and cardiovascular studies. I have also independently interpreted old and new ECG's.  Briefly, this is a 57 year old woman with a history of hypertension, type 2 diabetes mellitus, hyperlipidemia, asthma, and bipolar disorder.  Last night she went to get up to get a glass of water and experienced significant shortness of breath and chest tightness.  She has not experienced anything like this before.  She came to the ED and chest x-ray showed "cardiomegaly with diffuse interstitial opacities, likely edema/CHF ".  She was given some IV Lasix but symptoms did not markedly improved.  She was given Nitropaste and chest tightness resolved.  She had run out of her inhalers.  This morning she had some chest tightness and was given sublingual nitroglycerin with relief of chest tightness.  ECG demonstrated normal sinus rhythm with some mild nonspecific T wave abnormalities inferiorly.  There was no significant ST segment deviation.  Troponins have been normal.  BNP was also normal.  Echocardiogram was  just completed which I will review.  Looking quickly at the apical four-chamber images, left ventricular systolic function appeared grossly normal.  I will likely obtain a Lexiscan Myoview tomorrow morning given her symptoms and multiple cardiovascular risk factors.   Prentice Docker, MD, [facility]  07/17/2018 10:28 AM

## 2018-07-17 NOTE — Progress Notes (Signed)
Patient admitted to the hospital earlier this morning by Dr. Sharl Ma.  Patient seen and examined.  Overall feels that her breathing is improving, continues to have some pressure in her chest which she is not sure if it is gas.  Did describe some improvement with simethicone.  Reports that her symptoms do get worse on ambulation.    She was admitted to the hospital with chest discomfort and shortness of breath.  Thus far, cardiac enzymes have been negative.  Seen by cardiology with plans for stress test in a.m.  Echocardiogram shows diastolic dysfunction.  [employer]

## 2018-07-18 ENCOUNTER — Encounter (HOSPITAL_COMMUNITY): Payer: Self-pay

## 2018-07-18 ENCOUNTER — Observation Stay (HOSPITAL_BASED_OUTPATIENT_CLINIC_OR_DEPARTMENT_OTHER): Payer: Medicare HMO

## 2018-07-18 DIAGNOSIS — J810 Acute pulmonary edema: Secondary | ICD-10-CM

## 2018-07-18 DIAGNOSIS — F319 Bipolar disorder, unspecified: Secondary | ICD-10-CM | POA: Diagnosis not present

## 2018-07-18 DIAGNOSIS — R0609 Other forms of dyspnea: Secondary | ICD-10-CM

## 2018-07-18 DIAGNOSIS — I5030 Unspecified diastolic (congestive) heart failure: Secondary | ICD-10-CM | POA: Diagnosis not present

## 2018-07-18 DIAGNOSIS — E1159 Type 2 diabetes mellitus with other circulatory complications: Secondary | ICD-10-CM | POA: Diagnosis present

## 2018-07-18 DIAGNOSIS — E039 Hypothyroidism, unspecified: Secondary | ICD-10-CM | POA: Diagnosis not present

## 2018-07-18 DIAGNOSIS — R079 Chest pain, unspecified: Secondary | ICD-10-CM | POA: Diagnosis not present

## 2018-07-18 DIAGNOSIS — E1165 Type 2 diabetes mellitus with hyperglycemia: Secondary | ICD-10-CM | POA: Diagnosis present

## 2018-07-18 DIAGNOSIS — E1169 Type 2 diabetes mellitus with other specified complication: Secondary | ICD-10-CM

## 2018-07-18 DIAGNOSIS — I10 Essential (primary) hypertension: Secondary | ICD-10-CM | POA: Diagnosis not present

## 2018-07-18 LAB — NM MYOCAR MULTI W/SPECT W/WALL MOTION / EF
CHL CUP NUCLEAR SDS: 0
CHL CUP NUCLEAR SRS: 2
CHL CUP NUCLEAR SSS: 2
LV dias vol: 99 mL (ref 46–106)
LV sys vol: 36 mL
NUC STRESS TID: 1.07
Peak HR: 93 {beats}/min
RATE: 0.38
Rest HR: 78 {beats}/min

## 2018-07-18 LAB — GLUCOSE, CAPILLARY
Glucose-Capillary: 119 mg/dL — ABNORMAL HIGH (ref 70–99)
Glucose-Capillary: 139 mg/dL — ABNORMAL HIGH (ref 70–99)

## 2018-07-18 LAB — HIV ANTIBODY (ROUTINE TESTING W REFLEX): HIV Screen 4th Generation wRfx: NONREACTIVE

## 2018-07-18 LAB — HEMOGLOBIN A1C
Hgb A1c MFr Bld: 10.7 % — ABNORMAL HIGH (ref 4.8–5.6)
MEAN PLASMA GLUCOSE: 260 mg/dL

## 2018-07-18 MED ORDER — SODIUM CHLORIDE 0.9% FLUSH
INTRAVENOUS | Status: AC
  Administered 2018-07-18: 10 mL via INTRAVENOUS
  Filled 2018-07-18: qty 10

## 2018-07-18 MED ORDER — PANTOPRAZOLE SODIUM 40 MG PO TBEC: 40.0000 mg | DELAYED_RELEASE_TABLET | Freq: Every day | ORAL | 1 refills | Status: DC

## 2018-07-18 MED ORDER — TECHNETIUM TC 99M TETROFOSMIN IV KIT
10.0000 | PACK | Freq: Once | INTRAVENOUS | Status: AC | PRN
  Administered 2018-07-18: 11 via INTRAVENOUS

## 2018-07-18 MED ORDER — POTASSIUM CHLORIDE ER 20 MEQ PO TBCR: 20.0000 meq | EXTENDED_RELEASE_TABLET | Freq: Every day | ORAL | 1 refills | Status: DC

## 2018-07-18 MED ORDER — INSULIN GLARGINE 100 UNIT/ML ~~LOC~~ SOLN: 85.0000 [IU] | Freq: Two times a day (BID) | SUBCUTANEOUS | 0 refills | Status: DC

## 2018-07-18 MED ORDER — TECHNETIUM TC 99M TETROFOSMIN IV KIT
30.0000 | PACK | Freq: Once | INTRAVENOUS | Status: AC | PRN
  Administered 2018-07-18: 30.1 via INTRAVENOUS

## 2018-07-18 MED ORDER — FUROSEMIDE 20 MG PO TABS: 20.0000 mg | ORAL_TABLET | Freq: Every day | ORAL | 11 refills | Status: DC

## 2018-07-18 MED ORDER — ISOSORBIDE MONONITRATE ER 30 MG PO TB24: 30.0000 mg | ORAL_TABLET | Freq: Every day | ORAL | 11 refills | Status: DC

## 2018-07-18 MED ORDER — REGADENOSON 0.4 MG/5ML IV SOLN
INTRAVENOUS | Status: AC
  Administered 2018-07-18: 0.4 mg via INTRAVENOUS
  Filled 2018-07-18: qty 5

## 2018-07-18 NOTE — Progress Notes (Signed)
Pt discharged home today per Dr. Memon. Pt's IV site D/C'd and WDL. Pt's VSS. Pt provided with home medication list, discharge instructions and prescriptions. Verbalized understanding. Pt left floor via WC in stable condition accompanied by NT.  

## 2018-07-18 NOTE — Progress Notes (Addendum)
Progress Note  Patient Name: [name] Date of Encounter: 07/18/2018  Primary Cardiologist: Prentice Docker, MD   Subjective   Evaluated in Stress Test lab. Denies any recurrent chest pain or dyspnea overnight. Feeling back to baseline. Has been NPO since midnight for stress test.   Inpatient Medications    Scheduled Meds: . regadenoson      . sodium chloride flush      . amLODipine  10 mg Oral Daily  . atorvastatin  80 mg Oral q morning - 10a  . enoxaparin (LOVENOX) injection  80 mg Subcutaneous Q24H  . escitalopram  20 mg Oral BID  . Influenza vac split quadrivalent PF  0.5 mL Intramuscular Tomorrow-1000  . insulin aspart  0-9 Units Subcutaneous TID WC  . insulin glargine  85 Units Subcutaneous BID  . ipratropium-albuterol  3 mL Nebulization Q6H  . levothyroxine  50 mcg Oral Q0600  . pneumococcal 23 valent vaccine  0.5 mL Intramuscular Tomorrow-1000  . risperiDONE  1 mg Oral BH-q7a  . risperiDONE  2 mg Oral QHS   Continuous Infusions: . sodium chloride Stopped (07/17/18 0327)   PRN Meds: acetaminophen **OR** acetaminophen, nitroGLYCERIN, ondansetron **OR** ondansetron (ZOFRAN) IV, technetium tetrofosmin, traZODone   Vital Signs    Vitals:   07/17/18 1831 07/17/18 1959 07/17/18 2129 07/18/18 0646  BP: 121/72  (!) 135/58 (!) 141/66  Pulse: 81  80 80  Resp: 20     Temp: 98.9 F (37.2 C)  98.7 F (37.1 C) 98.1 F (36.7 C)  TempSrc: Oral  Oral Oral  SpO2: 99% 97% 97% 95%  Weight:      Height:        Intake/Output Summary (Last 24 hours) at 07/18/2018 0738 Last data filed at 07/17/2018 2111 Gross per 24 hour  Intake 720 ml  Output 1200 ml  Net -480 ml   Filed Weights   07/16/18 2104 07/17/18 0334  Weight: 135.6 kg (!) 153.2 kg    Telemetry    NSR, HR in 70's to 80's. No significant arrhythmias. - Personally Reviewed  ECG    NSR, HR 78, with nonspecific T-wave abnormalities along inferior leads. No acute ST changes when compared to prior  tracings.  - Personally Reviewed  Physical Exam   General: Well developed, obese Caucasian female appearing in no acute distress. Head: Normocephalic, atraumatic.  Neck: Supple without bruits, JVD difficult to assess secondary to body habitus. Lungs:  Resp regular and unlabored, CTA without wheezing or rales. Heart: RRR, S1, S2, no S3, S4, or murmur; no rub. Abdomen: Soft, non-tender, non-distended with normoactive bowel sounds. No hepatomegaly. No rebound/guarding. No obvious abdominal masses. Extremities: No clubbing or cyanosis, trace edema. Distal pedal pulses are 2+ bilaterally. Neuro: Alert and oriented X 3. Moves all extremities spontaneously. Psych: Normal affect.  Labs    Chemistry Recent Labs  Lab 07/16/18 2323 07/17/18 0514  NA 140 140  K 3.4* 3.4*  CL 108 107  CO2 24 22  GLUCOSE 278* 205*  BUN 32* 31*  CREATININE 1.32* 1.17*  CALCIUM 8.7* 8.8*  PROT  --  6.8  ALBUMIN  --  3.0*  AST  --  22  ALT  --  38  ALKPHOS  --  75  BILITOT  --  0.6  GFRNONAA 44* 50*  GFRAA 50* 58*  ANIONGAP 8 11     Hematology Recent Labs  Lab 07/16/18 2323 07/17/18 0514  WBC 6.6 5.7  RBC 3.59* 3.45*  HGB 10.0* 9.7*  HCT 31.9* 31.1*  MCV 88.9 90.1  MCH 27.9 28.1  MCHC 31.3 31.2  RDW 12.4 12.4  PLT 281 285    Cardiac Enzymes Recent Labs  Lab 07/16/18 2323 07/17/18 0514 07/17/18 0901 07/17/18 1510  TROPONINI <0.03 <0.03 <0.03 <0.03   No results for input(s): TROPIPOC in the last 168 hours.   BNP Recent Labs  Lab 07/16/18 2324  BNP 73.0     DDimer No results for input(s): DDIMER in the last 168 hours.   Radiology    Dg Chest 2 View  Result Date: 07/16/2018 CLINICAL DATA:  Shortness of Breath EXAM: CHEST - 2 VIEW COMPARISON:  07/13/2017 FINDINGS: Cardiomegaly. Diffuse interstitial opacities throughout the lungs, likely interstitial edema. Small bilateral effusions. No acute bony abnormality. IMPRESSION: Cardiomegaly with diffuse interstitial opacities,  likely edema/CHF. Small effusions. Electronically Signed   By: Charlett Nose M.D.   On: 07/16/2018 22:23    Cardiac Studies   Echocardiogram: 07/17/2018 Study Conclusions  - Left ventricle: The cavity size was normal. Wall thickness was   increased in a pattern of moderate LVH. Systolic function was   vigorous. The estimated ejection fraction was in the range of 65%   to 70%. Wall motion was normal; there were no regional wall   motion abnormalities. Features are consistent with a pseudonormal   left ventricular filling pattern, with concomitant abnormal   relaxation and increased filling pressure (grade 2 diastolic   dysfunction). Doppler parameters are consistent with high   ventricular filling pressure. - Aortic valve: Trileaflet; mildly thickened, mildly calcified   leaflets. There was very mild stenosis. Peak velocity (S): 208   cm/s. Mean gradient (S): 9 mm Hg. Valve area (VTI): 2 cm^2. Valve   area (Vmax): 2 cm^2. Valve area (Vmean): 1.95 cm^2.  Patient Profile     57 y.o. female w/ PMH of HTN, HLD, TYpe 2 DM, asthma, and bipolar disorder who presented to [facility] ED On 07/16/2018 for evaluation of chest pain and worsening dyspnea.   Assessment & Plan    1. Chest Pain with Mixed Typical and Atypical Features - developed acute chest tightness and dyspnea prior to her arrival to the ED. Has experienced recurrent intermittent episodes of chest discomfort since admission.  - initial and cyclic troponin values have been negative. EKG with no definitive changes when compared to prior tracings. Echocardiogram shows a preserved EF of 65-70% with Grade 2 DD and no regional WMA. Does have mild AS.  - Lexiscan Myoview obtained this morning with official report pending following stress images.   2. HTN - BP has been well-controlled at 121/58 - 141/72 since admission.  - continue Amlodipine 10mg  daily. If additional BP control is needed in the future, would consider ACE-I or ARB given  concurrent IDDM.   3. HLD - no recent FLP on file. Followed by PCP. Remains on Atorvastatin 80mg  daily.   4. Type 2 DM - Hgb A1c elevated to 10.7. On high-dose Lantus along with SSI.  - per admitting team.   5. Stage 2-3 CKD - creatinine elevated to 1.32 on admission, improved to 1.17 on most recent check.   6. Normocytic Anemia - Hgb 9.7 on most recent check. Was 13.5 two weeks ago. Denies any evidence of active bleeding. Further evaluation per admitting team.    For questions or updates, please contact [facility] Please consult [email or web address] for contact info under Cardiology/STEMI.   Signed, , PA-C 7:38 AM 07/18/2018 Pager: [phone]  The  patient was seen and examined, and I agree with the history, physical exam, assessment and plan as documented above, with modifications as noted below.  She is feeling well this morning.  She did have another episode of chest pain last night alleviated with nitroglycerin.  Shortness of breath is stable.  I personally reviewed the nuclear stress test which did not demonstrate any significant evidence of myocardial ischemia or scar.  I would discharge her on Lasix 20 mg daily with supplemental potassium and Imdur 30 mg daily.  We will arrange for outpatient follow-up in our office in [place].  [facility] will sign off.   Medication Recommendations: Add Lasix and Imdur as noted above. Other recommendations (labs, testing, etc): None Follow up as an outpatient: In cardiology office in [place].  We will arrange for this.   Prentice Docker, MD, [facility]  07/18/2018 10:48 AM

## 2018-07-18 NOTE — Care Management (Signed)
CM consult for med assistance. Pt has insurance with drug coverage. Benefits check complete for inhaler. Results discussed with daughter who manages pt's medications.

## 2018-07-18 NOTE — Discharge Summary (Signed)
Physician Discharge Summary  [name] MVV:[id number] DOB: January 09, 1961 DOA: 07/16/2018  PCP: Barbie Banner, MD  Admit date: 07/16/2018 Discharge date: 07/18/2018  Admitted From: Home Disposition: Home  Recommendations for Outpatient Follow-up:  1. Follow up with PCP in 1-2 weeks 2. Please obtain BMP/CBC in one week 3. Follow-up has been scheduled with cardiology 4. For further work-up for normocytic anemia to be done as an outpatient  Discharge Condition: Stable CODE STATUS: Full code Diet recommendation: Heart healthy, carb modified  Brief/Interim Summary: 57 year old female with hypertension, diabetes, morbid obesity, presented to the hospital with complaints of shortness of breath and chest discomfort.  She ruled out for ACS with negative cardiac markers.  She was seen by cardiology who performed echocardiogram and stress test.  This is found to be a low risk study.  EF was noted to be normal range but she did have diastolic dysfunction.  Her BNP was noted to be normal.  She was started on low-dose Lasix as well as Imdur.  Her symptoms have significantly improved and she is not having any further chest pain or shortness of breath.  She also did report some relief with antacids and she will be started on Protonix.  She is been scheduled to follow-up with cardiology as an outpatient.  Patient is feeling better and feels ready for discharge home.  Discharge Diagnoses:  Principal Problem:   Chest pain Active Problems:   OBESITY, MORBID   Essential hypertension   Bipolar disorder, unspecified (HCC)   Type 2 diabetes mellitus with hyperlipidemia (HCC)   Hypothyroidism    Discharge Instructions  Discharge Instructions    Diet - low sodium heart healthy   Complete by:  As directed    Increase activity slowly   Complete by:  As directed      Allergies as of 07/18/2018      Reactions   Amoxicillin Swelling, Other (See Comments)   Oral swelling   Doxycycline Nausea And  Vomiting   Levofloxacin Other (See Comments)   headache   Propoxyphene N-acetaminophen Nausea And Vomiting      Medication List    TAKE these medications   albuterol 108 (90 Base) MCG/ACT inhaler Commonly known as:  PROVENTIL HFA;VENTOLIN HFA Inhale 2 puffs into the lungs every 6 (six) hours as needed for wheezing or shortness of breath.   amLODipine 10 MG tablet Commonly known as:  NORVASC Take 1 tablet (10 mg total) by mouth daily. For high blood pressure   atorvastatin 80 MG tablet Commonly known as:  LIPITOR Take 1 tablet (80 mg total) by mouth every morning. What changed:  when to take this   escitalopram 20 MG tablet Commonly known as:  LEXAPRO Take 1 tablet (20 mg total) by mouth 2 (two) times daily. for mood control   furosemide 20 MG tablet Commonly known as:  LASIX Take 1 tablet (20 mg total) by mouth daily.   glipiZIDE 10 MG tablet Commonly known as:  GLUCOTROL Take 1 tablet (10 mg total) by mouth 2 (two) times daily before a meal.   hydrOXYzine 50 MG tablet Commonly known as:  ATARAX/VISTARIL Take 1 tablet (50 mg total) by mouth every 6 (six) hours as needed for anxiety.   insulin aspart 100 UNIT/ML injection Commonly known as:  novoLOG Inject 10 Units into the skin 3 (three) times daily with meals. What changed:    how much to take  how to take this  additional instructions   insulin glargine 100 UNIT/ML  injection Commonly known as:  LANTUS Inject 0.85 mLs (85 Units total) into the skin 2 (two) times daily.   isosorbide mononitrate 30 MG 24 hr tablet Commonly known as:  IMDUR Take 1 tablet (30 mg total) by mouth daily.   levothyroxine 50 MCG tablet Commonly known as:  SYNTHROID, LEVOTHROID Take 1 tablet (50 mcg total) by mouth daily before breakfast. For hypothyroidism   pantoprazole 40 MG tablet Commonly known as:  PROTONIX Take 1 tablet (40 mg total) by mouth daily.   Potassium Chloride ER 20 MEQ Tbcr Take 20 mEq by mouth daily.    risperiDONE 1 MG tablet Commonly known as:  RISPERDAL Take one in the am and two at bedtime   traZODone 50 MG tablet Commonly known as:  DESYREL Take 1 tablet (50 mg total) by mouth at bedtime as needed for sleep.       Allergies  Allergen Reactions  . Amoxicillin Swelling and Other (See Comments)    Oral swelling   . Doxycycline Nausea And Vomiting  . Levofloxacin Other (See Comments)    headache  . Propoxyphene N-Acetaminophen Nausea And Vomiting    Consultations:  Cardiology   Procedures/Studies: Dg Chest 2 View  Result Date: 07/16/2018 CLINICAL DATA:  Shortness of Breath EXAM: CHEST - 2 VIEW COMPARISON:  07/13/2017 FINDINGS: Cardiomegaly. Diffuse interstitial opacities throughout the lungs, likely interstitial edema. Small bilateral effusions. No acute bony abnormality. IMPRESSION: Cardiomegaly with diffuse interstitial opacities, likely edema/CHF. Small effusions. Electronically Signed   By: Charlett Nose M.D.   On: 07/16/2018 22:23   Nm Myocar Multi W/spect W/wall Motion / Ef  Result Date: 07/18/2018  There was no ST segment deviation noted during stress.  Defect 1: There is a small defect of mild severity present in the mid anteroseptal and apical anterior location. This is due to soft tissue attenuation. There are no significant ischemic territories.  The study is normal.  This is a low risk study.  Nuclear stress EF: 64%.  Nonspecific T wave abnormalities inferiorly throughout study.    Echo:- Left ventricle: The cavity size was normal. Wall thickness was   increased in a pattern of moderate LVH. Systolic function was   vigorous. The estimated ejection fraction was in the range of 65%   to 70%. Wall motion was normal; there were no regional wall   motion abnormalities. Features are consistent with a pseudonormal   left ventricular filling pattern, with concomitant abnormal   relaxation and increased filling pressure (grade 2 diastolic   dysfunction).  Doppler parameters are consistent with high   ventricular filling pressure. - Aortic valve: Trileaflet; mildly thickened, mildly calcified   leaflets. There was very mild stenosis. Peak velocity (S): 208   cm/s. Mean gradient (S): 9 mm Hg. Valve area (VTI): 2 cm^2. Valve   area (Vmax): 2 cm^2. Valve area (Vmean): 1.95 cm^2.    Subjective: No further chest pain or shortness of breath.  Feeling better.  Discharge Exam: Vitals:   07/17/18 2129 07/18/18 0646 07/18/18 0743 07/18/18 1428  BP: (!) 135/58 (!) 141/66    Pulse: 80 80    Resp:      Temp: 98.7 F (37.1 C) 98.1 F (36.7 C)    TempSrc: Oral Oral    SpO2: 97% 95% 94% 96%  Weight:      Height:        General: Pt is alert, awake, not in acute distress Cardiovascular: RRR, S1/S2 +, no rubs, no gallops Respiratory: CTA bilaterally, no  wheezing, no rhonchi Abdominal: Soft, NT, ND, bowel sounds + Extremities: no edema, no cyanosis    The results of significant diagnostics from this hospitalization (including imaging, microbiology, ancillary and laboratory) are listed below for reference.     Microbiology: No results found for this or any previous visit (from the past 240 hour(s)).   Labs: BNP (last 3 results) Recent Labs    07/16/18 2324  BNP 73.0   Basic Metabolic Panel: Recent Labs  Lab 07/16/18 2323 07/17/18 0514  NA 140 140  K 3.4* 3.4*  CL 108 107  CO2 24 22  GLUCOSE 278* 205*  BUN 32* 31*  CREATININE 1.32* 1.17*  CALCIUM 8.7* 8.8*   Liver Function Tests: Recent Labs  Lab 07/17/18 0514  AST 22  ALT 38  ALKPHOS 75  BILITOT 0.6  PROT 6.8  ALBUMIN 3.0*   No results for input(s): LIPASE, AMYLASE in the last 168 hours. No results for input(s): AMMONIA in the last 168 hours. CBC: Recent Labs  Lab 07/16/18 2323 07/17/18 0514  WBC 6.6 5.7  HGB 10.0* 9.7*  HCT 31.9* 31.1*  MCV 88.9 90.1  PLT 281 285   Cardiac Enzymes: Recent Labs  Lab 07/16/18 2323 07/17/18 0514 07/17/18 0901  07/17/18 1510  TROPONINI <0.03 <0.03 <0.03 <0.03   BNP: Invalid input(s): POCBNP CBG: Recent Labs  Lab 07/17/18 1106 07/17/18 1627 07/17/18 2128 07/18/18 0725 07/18/18 1122  GLUCAP 215* 204* 233* 119* 139*   D-Dimer No results for input(s): DDIMER in the last 72 hours. Hgb A1c Recent Labs    07/17/18 0514  HGBA1C 10.7*   Lipid Profile No results for input(s): CHOL, HDL, LDLCALC, TRIG, CHOLHDL, LDLDIRECT in the last 72 hours. Thyroid function studies No results for input(s): TSH, T4TOTAL, T3FREE, THYROIDAB in the last 72 hours.  Invalid input(s): FREET3 Anemia work up No results for input(s): VITAMINB12, FOLATE, FERRITIN, TIBC, IRON, RETICCTPCT in the last 72 hours. Urinalysis    Component Value Date/Time   COLORURINE YELLOW 07/03/2018 1435   APPEARANCEUR HAZY (A) 07/03/2018 1435   LABSPEC 1.027 07/03/2018 1435   PHURINE 5.0 07/03/2018 1435   GLUCOSEU >=500 (A) 07/03/2018 1435   HGBUR NEGATIVE 07/03/2018 1435   BILIRUBINUR NEGATIVE 07/03/2018 1435   KETONESUR NEGATIVE 07/03/2018 1435   PROTEINUR NEGATIVE 07/03/2018 1435   UROBILINOGEN 0.2 09/09/2010 0130   NITRITE NEGATIVE 07/03/2018 1435   LEUKOCYTESUR NEGATIVE 07/03/2018 1435   Sepsis Labs Invalid input(s): PROCALCITONIN,  WBC,  LACTICIDVEN Microbiology No results found for this or any previous visit (from the past 240 hour(s)).   Time coordinating discharge:  SIGNED:   Erick Blinks, MD  [facility] 07/18/2018, 2:51 PM Pager   If 7PM-7AM, please contact night-coverage [email or web address] Password [email or web address]

## 2018-07-18 NOTE — Plan of Care (Signed)
  RD consulted for nutrition education regarding diabetes.   Patient usual intake is cereal with banana most days and coffee. In the afternoon she goes to her daughters house to meet her grandson when he gets off the bus. Patient eats with him often a pizza or alternate frozen meal. In the evening she prepares chicken wraps or other deli type protein source or canned vegetables, corn, peas, tomatoes, corn and okra.   Lab Results  Component Value Date   HGBA1C 10.7 (H) 07/17/2018    RD provided "Carbohydrate Counting for People with Diabetes" handout from the Academy of Nutrition and Dietetics. Discussed different food groups and their effects on blood sugar, emphasizing carbohydrate-containing foods.   Discussed importance of controlled and consistent carbohydrate intake throughout the day. Provided examples of ways to balance meals/snacks and encouraged intake of high-fiber, whole grain complex carbohydrates.   Provided list of carbohydrates and recommended serving sizes of common foods. Stressing the importance of portion control, weight loss and increased activity with MD approval.  Teach back method used. Expect fair compliance.   Body mass index is 57.97 kg/m. Pt meets criteria for morbid obesity based on current BMI.  Current diet order is NPO at this time.   Labs: BMP Latest Ref Rng & Units 07/17/2018 07/16/2018 07/03/2018  Glucose 70 - 99 mg/dL 774(J) 287(O) 676(H)  BUN 6 - 20 mg/dL 20(N) 47(S) 96(G)  Creatinine 0.44 - 1.00 mg/dL 8.36(O) 2.94(T) 6.54(Y)  Sodium 135 - 145 mmol/L 140 140 132(L)  Potassium 3.5 - 5.1 mmol/L 3.4(L) 3.4(L) 3.4(L)  Chloride 98 - 111 mmol/L 107 108 98  CO2 22 - 32 mmol/L 22 24 24   Calcium 8.9 - 10.3 mg/dL ) 5.0(P) 5.4(S)    Medications reviewed and include:  SSI, Risperdal, Lipitor  No further nutrition interventions warranted at this time.   5.6(C MS,RD,CSG,LDN Office: [phone] Pager: [phone]

## 2018-07-18 NOTE — Progress Notes (Signed)
Inpatient Diabetes Program Recommendations  AACE/ADA: New Consensus Statement on Inpatient Glycemic Control (2015)  Target Ranges:  Prepandial:   less than 140 mg/dL      Peak postprandial:   less than 180 mg/dL (1-2 hours)      Critically ill patients:  140 - 180 mg/dL   Review of Glycemic Control  Diabetes history: DM 2 Outpatient Diabetes medications: Novolin 70/30 SSI with meals, Glipizide 10 mg BID  Inpatient Diabetes Program Recommendations:    Sees PCP. Last visit in care everywhere was in June 2019.   Spoke with patient over the phone. Patient reports taking insulin and glipizide as prescribed. Patient checks her glucose every morning. Discussed to check glucose other parts of they day when she maybe running higher. Discussed A1c level 10.7 and cardiovascular risk. Discussed A1c and glucose goals. Discussed follow up with PCP. Patient said she just spoke with a Dietitian. Patient said she learned of information that could help her with her diet.  Patient does not have DM related questions at this time.  Thanks,  Christena Deem RN, MSN, BC-ADM Inpatient Diabetes Coordinator Team Pager [phone] (8a-5p)

## 2018-07-24 ENCOUNTER — Observation Stay (HOSPITAL_COMMUNITY): Payer: Medicare HMO

## 2018-07-24 ENCOUNTER — Other Ambulatory Visit: Payer: Self-pay

## 2018-07-24 ENCOUNTER — Emergency Department (HOSPITAL_COMMUNITY): Payer: Medicare HMO

## 2018-07-24 ENCOUNTER — Observation Stay (HOSPITAL_COMMUNITY)
Admission: EM | Admit: 2018-07-24 | Discharge: 2018-07-25 | Disposition: A | Discharge status: Home / Self Care | Payer: Medicare HMO | Attending: Internal Medicine | Admitting: Internal Medicine

## 2018-07-24 ENCOUNTER — Encounter (HOSPITAL_COMMUNITY): Payer: Self-pay | Admitting: Emergency Medicine

## 2018-07-24 DIAGNOSIS — I214 Non-ST elevation (NSTEMI) myocardial infarction: Secondary | ICD-10-CM | POA: Diagnosis not present

## 2018-07-24 DIAGNOSIS — I5030 Unspecified diastolic (congestive) heart failure: Secondary | ICD-10-CM | POA: Insufficient documentation

## 2018-07-24 DIAGNOSIS — I110 Hypertensive heart disease with heart failure: Secondary | ICD-10-CM | POA: Insufficient documentation

## 2018-07-24 DIAGNOSIS — E119 Type 2 diabetes mellitus without complications: Secondary | ICD-10-CM | POA: Diagnosis not present

## 2018-07-24 DIAGNOSIS — I10 Essential (primary) hypertension: Secondary | ICD-10-CM | POA: Diagnosis present

## 2018-07-24 DIAGNOSIS — Z79899 Other long term (current) drug therapy: Secondary | ICD-10-CM | POA: Insufficient documentation

## 2018-07-24 DIAGNOSIS — Z794 Long term (current) use of insulin: Secondary | ICD-10-CM | POA: Diagnosis not present

## 2018-07-24 DIAGNOSIS — E039 Hypothyroidism, unspecified: Secondary | ICD-10-CM | POA: Diagnosis present

## 2018-07-24 DIAGNOSIS — E785 Hyperlipidemia, unspecified: Secondary | ICD-10-CM | POA: Diagnosis not present

## 2018-07-24 DIAGNOSIS — R0789 Other chest pain: Secondary | ICD-10-CM | POA: Diagnosis present

## 2018-07-24 DIAGNOSIS — F312 Bipolar disorder, current episode manic severe with psychotic features: Secondary | ICD-10-CM | POA: Diagnosis present

## 2018-07-24 DIAGNOSIS — J45909 Unspecified asthma, uncomplicated: Secondary | ICD-10-CM | POA: Diagnosis not present

## 2018-07-24 DIAGNOSIS — E668 Other obesity: Secondary | ICD-10-CM | POA: Insufficient documentation

## 2018-07-24 DIAGNOSIS — R079 Chest pain, unspecified: Secondary | ICD-10-CM | POA: Diagnosis present

## 2018-07-24 DIAGNOSIS — E1159 Type 2 diabetes mellitus with other circulatory complications: Secondary | ICD-10-CM | POA: Diagnosis present

## 2018-07-24 LAB — BASIC METABOLIC PANEL
Anion gap: 9 (ref 5–15)
BUN: 24 mg/dL — AB (ref 6–20)
CALCIUM: 8.9 mg/dL (ref 8.9–10.3)
CO2: 26 mmol/L (ref 22–32)
CREATININE: 0.96 mg/dL (ref 0.44–1.00)
Chloride: 101 mmol/L (ref 98–111)
GFR calc Af Amer: >60 mL/min (ref >60)
Glucose, Bld: 209 mg/dL — ABNORMAL HIGH (ref 70–99)
Potassium: 4.2 mmol/L (ref 3.5–5.1)
SODIUM: 136 mmol/L (ref 135–145)

## 2018-07-24 LAB — GLUCOSE, CAPILLARY: Glucose-Capillary: 259 mg/dL — ABNORMAL HIGH (ref 70–99)

## 2018-07-24 LAB — APTT: APTT: 28 s (ref 24–36)

## 2018-07-24 LAB — CBC
HCT: 35.9 % — ABNORMAL LOW (ref 36.0–46.0)
Hemoglobin: 11.3 g/dL — ABNORMAL LOW (ref 12.0–15.0)
MCH: 27 pg (ref 26.0–34.0)
MCHC: 31.5 g/dL (ref 30.0–36.0)
MCV: 85.9 fL (ref 80.0–100.0)
PLATELETS: 312 10*3/uL (ref 150–400)
RBC: 4.18 MIL/uL (ref 3.87–5.11)
RDW: 12.6 % (ref 11.5–15.5)
WBC: 6.5 10*3/uL (ref 4.0–10.5)
nRBC: 0 % (ref 0.0–0.2)

## 2018-07-24 LAB — TROPONIN I
TROPONIN I: 0.62 ng/mL — AB (ref <0.03)
Troponin I: 4.03 ng/mL (ref <0.03)

## 2018-07-24 LAB — PROTIME-INR
INR: 1.01
Prothrombin Time: 13.2 seconds (ref 11.4–15.2)

## 2018-07-24 MED ORDER — ONDANSETRON HCL 4 MG PO TABS: 4.0000 mg | ORAL_TABLET | Freq: Four times a day (QID) | ORAL | Status: DC | PRN

## 2018-07-24 MED ORDER — AMLODIPINE BESYLATE 5 MG PO TABS: 10.0000 mg | ORAL_TABLET | Freq: Every day | ORAL | Status: DC

## 2018-07-24 MED ORDER — METOPROLOL TARTRATE 25 MG PO TABS
12.5000 mg | ORAL_TABLET | Freq: Two times a day (BID) | ORAL | Status: DC
  Administered 2018-07-24: 12.5 mg via ORAL

## 2018-07-24 MED ORDER — POLYETHYLENE GLYCOL 3350 17 G PO PACK: 17.0000 g | PACK | Freq: Every day | ORAL | Status: DC | PRN

## 2018-07-24 MED ORDER — HEPARIN BOLUS VIA INFUSION
4000.0000 [IU] | Freq: Once | INTRAVENOUS | Status: AC
  Administered 2018-07-24: 4000 [IU] via INTRAVENOUS

## 2018-07-24 MED ORDER — HEPARIN SODIUM (PORCINE) 5000 UNIT/ML IJ SOLN
4000.0000 [IU] | Freq: Once | INTRAMUSCULAR | Status: DC
  Filled 2018-07-24 (×2): qty 1

## 2018-07-24 MED ORDER — ASPIRIN 325 MG PO TABS: 325.0000 mg | ORAL_TABLET | Freq: Once | ORAL | Status: DC

## 2018-07-24 MED ORDER — ATORVASTATIN CALCIUM 40 MG PO TABS
80.0000 mg | ORAL_TABLET | Freq: Every day | ORAL | Status: DC
  Administered 2018-07-24: 80 mg via ORAL
  Filled 2018-07-24: qty 2

## 2018-07-24 MED ORDER — NITROGLYCERIN IN D5W 200-5 MCG/ML-% IV SOLN: 0.0000 ug/min | INTRAVENOUS | Status: DC

## 2018-07-24 MED ORDER — ACETAMINOPHEN 325 MG PO TABS
650.0000 mg | ORAL_TABLET | Freq: Four times a day (QID) | ORAL | Status: DC | PRN
  Administered 2018-07-24: 650 mg via ORAL
  Filled 2018-07-24: qty 2

## 2018-07-24 MED ORDER — LEVOTHYROXINE SODIUM 50 MCG PO TABS: 50.0000 ug | ORAL_TABLET | Freq: Every day | ORAL | Status: DC

## 2018-07-24 MED ORDER — ALBUTEROL SULFATE (2.5 MG/3ML) 0.083% IN NEBU: 3.0000 mL | INHALATION_SOLUTION | Freq: Four times a day (QID) | RESPIRATORY_TRACT | Status: DC | PRN

## 2018-07-24 MED ORDER — PANTOPRAZOLE SODIUM 40 MG PO TBEC: 40.0000 mg | DELAYED_RELEASE_TABLET | Freq: Every day | ORAL | Status: DC

## 2018-07-24 MED ORDER — TRAZODONE HCL 50 MG PO TABS
50.0000 mg | ORAL_TABLET | Freq: Every day | ORAL | Status: DC
  Administered 2018-07-24: 50 mg via ORAL
  Filled 2018-07-24: qty 1

## 2018-07-24 MED ORDER — ASPIRIN 325 MG PO TABS
325.0000 mg | ORAL_TABLET | Freq: Once | ORAL | Status: AC
  Administered 2018-07-24: 325 mg via ORAL
  Filled 2018-07-24: qty 1

## 2018-07-24 MED ORDER — ISOSORBIDE MONONITRATE ER 60 MG PO TB24: 30.0000 mg | ORAL_TABLET | Freq: Every day | ORAL | Status: DC

## 2018-07-24 MED ORDER — NITROGLYCERIN 0.4 MG SL SUBL
SUBLINGUAL_TABLET | SUBLINGUAL | Status: AC
  Filled 2018-07-24: qty 1

## 2018-07-24 MED ORDER — HEPARIN (PORCINE) IN NACL 100-0.45 UNIT/ML-% IJ SOLN
1250.0000 [IU]/h | INTRAMUSCULAR | Status: DC
  Administered 2018-07-24: 1250 [IU]/h via INTRAVENOUS
  Filled 2018-07-24: qty 250

## 2018-07-24 MED ORDER — NITROGLYCERIN 0.4 MG SL SUBL
0.4000 mg | SUBLINGUAL_TABLET | Freq: Once | SUBLINGUAL | Status: AC
  Administered 2018-07-24: 0.4 mg via SUBLINGUAL

## 2018-07-24 MED ORDER — NITROGLYCERIN 0.4 MG SL SUBL: 0.4000 mg | SUBLINGUAL_TABLET | Freq: Once | SUBLINGUAL | Status: DC

## 2018-07-24 MED ORDER — ATORVASTATIN CALCIUM 40 MG PO TABS: 40.0000 mg | ORAL_TABLET | Freq: Every day | ORAL | Status: DC

## 2018-07-24 MED ORDER — NITROGLYCERIN 0.4 MG SL SUBL: 0.4000 mg | SUBLINGUAL_TABLET | SUBLINGUAL | Status: DC | PRN

## 2018-07-24 MED ORDER — RISPERIDONE 1 MG PO TABS: 1.0000 mg | ORAL_TABLET | ORAL | Status: DC

## 2018-07-24 MED ORDER — ACETAMINOPHEN 650 MG RE SUPP: 650.0000 mg | Freq: Four times a day (QID) | RECTAL | Status: DC | PRN

## 2018-07-24 MED ORDER — ESCITALOPRAM OXALATE 10 MG PO TABS
20.0000 mg | ORAL_TABLET | Freq: Two times a day (BID) | ORAL | Status: DC
  Administered 2018-07-24: 20 mg via ORAL
  Filled 2018-07-24: qty 2

## 2018-07-24 MED ORDER — RISPERIDONE 1 MG PO TABS: 1.0000 mg | ORAL_TABLET | Freq: Every day | ORAL | Status: DC

## 2018-07-24 MED ORDER — INSULIN ASPART 100 UNIT/ML ~~LOC~~ SOLN
0.0000 [IU] | SUBCUTANEOUS | Status: DC
  Administered 2018-07-24: 8 [IU] via SUBCUTANEOUS

## 2018-07-24 MED ORDER — INSULIN ASPART 100 UNIT/ML ~~LOC~~ SOLN: 0.0000 [IU] | Freq: Three times a day (TID) | SUBCUTANEOUS | Status: DC

## 2018-07-24 MED ORDER — MORPHINE SULFATE (PF) 2 MG/ML IV SOLN
2.0000 mg | INTRAVENOUS | Status: DC | PRN
  Administered 2018-07-24: 2 mg via INTRAVENOUS
  Filled 2018-07-24: qty 1

## 2018-07-24 MED ORDER — FUROSEMIDE 20 MG PO TABS: 20.0000 mg | ORAL_TABLET | Freq: Every day | ORAL | Status: DC

## 2018-07-24 MED ORDER — METOPROLOL TARTRATE 25 MG PO TABS
ORAL_TABLET | ORAL | Status: AC
  Filled 2018-07-24: qty 1

## 2018-07-24 MED ORDER — BUSPIRONE HCL 5 MG PO TABS
30.0000 mg | ORAL_TABLET | Freq: Two times a day (BID) | ORAL | Status: DC
  Administered 2018-07-24: 30 mg via ORAL
  Filled 2018-07-24: qty 6

## 2018-07-24 MED ORDER — RISPERIDONE 1 MG PO TABS
2.0000 mg | ORAL_TABLET | Freq: Every day | ORAL | Status: DC
  Administered 2018-07-24: 2 mg via ORAL
  Filled 2018-07-24: qty 2

## 2018-07-24 MED ORDER — IOPAMIDOL (ISOVUE-370) INJECTION 76%
100.0000 mL | Freq: Once | INTRAVENOUS | Status: AC | PRN
  Administered 2018-07-24: 100 mL via INTRAVENOUS

## 2018-07-24 MED ORDER — ONDANSETRON HCL 4 MG/2ML IJ SOLN: 4.0000 mg | Freq: Four times a day (QID) | INTRAMUSCULAR | Status: DC | PRN

## 2018-07-24 NOTE — Progress Notes (Signed)
ANTICOAGULATION CONSULT NOTE - Initial Consult  Pharmacy Consult for Heparin Indication: chest pain/ACS  Allergies  Allergen Reactions  . Amoxicillin Swelling and Other (See Comments)    Oral swelling   . Doxycycline Nausea And Vomiting  . Levofloxacin Other (See Comments)    headache  . Propoxyphene N-Acetaminophen Nausea And Vomiting    Patient Measurements: Height: 5\' 4"  (162.6 cm) Weight: (!) 327 lb (148.3 kg) IBW/kg (Calculated) : 54.7 HEPARIN DW (KG): 92.4   Vital Signs: Temp: 98.5 F (36.9 C) (10/28 1515) Temp Source: Oral (10/28 1515) BP: 146/60 (10/28 1909) Pulse Rate: 74 (10/28 1909)  Labs: Recent Labs    07/24/18 1604  HGB 11.3*  HCT 35.9*  PLT 312  APTT 28  LABPROT 13.2  INR 1.01  CREATININE 0.96  TROPONINI 0.62*    Estimated Creatinine Clearance: 92.9 mL/min (by C-G formula based on SCr of 0.96 mg/dL).   Medical History: Past Medical History:  Diagnosis Date  . Asthma   . Back pain   . Chest pain   . Diabetes mellitus type II   . Headache   . High cholesterol   . Hypertension   . Obesity, morbid (HCC)   . Other and unspecified bipolar disorders   . 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 Thyroid disease     Medications:   (Not in a hospital admission)  Assessment: Okay for Protocol, current home medication list pending.  Goal of Therapy:  Heparin level 0.3-0.7 units/ml Monitor platelets by anticoagulation protocol: Yes   Plan:  Give 4000 units bolus x 1 Start heparin infusion at 1250 units/hr Check anti-Xa level in 6-8 hours and daily while on heparin Continue to monitor H&H and platelets  08/2014 07/24/2018,7:16 PM

## 2018-07-24 NOTE — H&P (Addendum)
History and Physical    [name] EXB:[id number] DOB: October 18, 1960 DOA: 07/24/2018  PCP: Barbie Banner, MD   Patient coming from: Home  Chief Complaint: Chest Pain  HPI: [name] is a 57 y.o. female with medical history significant for Morbid obesity, HTN, BPD, DM, hypothyroidism, asthma.  Patient presented to the ED with complaints of chest pain intermittent since recent hospital admission a week ago.  Chest pain today, rated at about 12 noon when she was at home, was the most severe since onset.  Described as "horse sitting on her chest", intermittently relieved by nitroglycerin given in the ED. Chest pain radiates into her right upper arm, with associated shortness of breath, palpitations diaphoresis and nausea without vomiting. Family history of heart attack in grandparents and uncle.  No personal or family history of blood clots.  No lower extremity swelling or redness.  No cough, no fevers.  Since hospital admission 10/21- 10/22-patient presented with chest pain shortness of breath, had stress test and echocardiogram read as low risk, echo with diastolic dysfunction.  And discharged with low-dose Lasix and Imdur, with Protonix.  ED Course: Blood pressure systolic 130s to 102V.  Creatinine elevated 0.62.  EKG without ST or T wave abnormalities unchanged.  Unremarkable CBC BMP.  Cardiologist on call, Dr. Jacques Navy was consulted no beds at [facility], recommended hospitalist admission here, pending troponin trend, will need to reconsult cardiology.  Review of Systems: As per HPI all other systems reviewed and negative  Past Medical History:  Diagnosis Date  . Asthma   . Back pain   . Chest pain   . Diabetes mellitus type II   . Headache   . High cholesterol   . Hypertension   . Obesity, morbid (HCC)   . Other and unspecified bipolar disorders   . 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 Thyroid disease     Past Surgical History:  Procedure Laterality Date  . TUBAL LIGATION      Bilateral     reports that she has never smoked. She has never used smokeless tobacco. She reports that she does not drink alcohol or use drugs.  Allergies  Allergen Reactions  . Amoxicillin Swelling and Other (See Comments)    Oral swelling   . Doxycycline Nausea And Vomiting  . Levofloxacin Other (See Comments)    headache  . Propoxyphene N-Acetaminophen Nausea And Vomiting    Family History  Problem Relation Age of Onset  . Heart attack Maternal Uncle   . Depression Maternal Uncle   . Depression Mother   . Alcohol abuse Maternal Grandfather   . Alcohol abuse Maternal Uncle   . Depression Maternal Grandmother     Prior to Admission medications   Medication Sig Start Date End Date Taking? Authorizing Provider  albuterol (PROVENTIL HFA;VENTOLIN HFA) 108 (90 Base) MCG/ACT inhaler Inhale 2 puffs into the lungs every 6 (six) hours as needed for wheezing or shortness of breath. 07/07/18   Money, Gerlene Burdock, FNP  amLODipine (NORVASC) 10 MG tablet Take 1 tablet (10 mg total) by mouth daily. For high blood pressure 07/07/18   Money, Gerlene Burdock, FNP  atorvastatin (LIPITOR) 80 MG tablet Take 1 tablet (80 mg total) by mouth every morning. Patient taking differently: Take 80 mg by mouth daily at 6 PM.  07/07/18   Money, Gerlene Burdock, FNP  escitalopram (LEXAPRO) 20 MG tablet Take 1 tablet (20 mg total) by mouth 2 (two) times daily. for mood control 07/10/18   Tenny Craw,  Mellody Drown, MD  furosemide (LASIX) 20 MG tablet Take 1 tablet (20 mg total) by mouth daily. 07/18/18 07/18/19  Erick Blinks, MD  glipiZIDE (GLUCOTROL) 10 MG tablet Take 1 tablet (10 mg total) by mouth 2 (two) times daily before a meal. 07/07/18   Money, Gerlene Burdock, FNP  hydrOXYzine (ATARAX/VISTARIL) 50 MG tablet Take 1 tablet (50 mg total) by mouth every 6 (six) hours as needed for anxiety. 07/10/18   Myrlene Broker, MD  insulin aspart (NOVOLOG) 100 UNIT/ML injection Inject 10 Units into the skin 3 (three) times daily with  meals. Patient taking differently: 3 (three) times daily with meals. Sliding scale 07/07/18   Money, Gerlene Burdock, FNP  insulin glargine (LANTUS) 100 UNIT/ML injection Inject 0.85 mLs (85 Units total) into the skin 2 (two) times daily. 07/18/18   Erick Blinks, MD  isosorbide mononitrate (IMDUR) 30 MG 24 hr tablet Take 1 tablet (30 mg total) by mouth daily. 07/18/18 07/18/19  Erick Blinks, MD  levothyroxine (SYNTHROID, LEVOTHROID) 50 MCG tablet Take 1 tablet (50 mcg total) by mouth daily before breakfast. For hypothyroidism 07/07/18   Money, Gerlene Burdock, FNP  pantoprazole (PROTONIX) 40 MG tablet Take 1 tablet (40 mg total) by mouth daily. 07/18/18 07/18/19  Erick Blinks, MD  potassium chloride 20 MEQ TBCR Take 20 mEq by mouth daily. 07/18/18   Erick Blinks, MD  risperiDONE (RISPERDAL) 1 MG tablet Take one in the am and two at bedtime 07/10/18   Myrlene Broker, MD  traZODone (DESYREL) 50 MG tablet Take 1 tablet (50 mg total) by mouth at bedtime as needed for sleep. 07/10/18   Myrlene Broker, MD    Physical Exam: Vitals:   07/24/18 1630 07/24/18 1645 07/24/18 1700 07/24/18 1745  BP: (!) 148/82 (!) 122/54 (!) 141/67 137/79  Pulse: 71 68 74 74  Resp: 15 16 15 15   Temp:      TempSrc:      SpO2: 97% (!) 85% 98% 96%  Weight:      Height:        Constitutional: calm, comfortable, obese Vitals:   07/24/18 1630 07/24/18 1645 07/24/18 1700 07/24/18 1745  BP: (!) 148/82 (!) 122/54 (!) 141/67 137/79  Pulse: 71 68 74 74  Resp: 15 16 15 15   Temp:      TempSrc:      SpO2: 97% (!) 85% 98% 96%  Weight:      Height:       Eyes: PERRL, lids and conjunctivae normal ENMT: Mucous membranes are moist. Posterior pharynx clear of any exudate or lesions. Neck: normal, supple, no masses, no thyromegaly Respiratory: clear to auscultation bilaterally, no wheezing, no crackles. Normal respiratory effort. No accessory muscle use.  Cardiovascular: Regular rate and rhythm, no murmurs / rubs / gallops. No  extremity edema. 2+ pedal pulses.  Abdomen: Full no tenderness, no masses palpated. No hepatosplenomegaly. Bowel sounds positive.  Musculoskeletal: no clubbing / cyanosis. No joint deformity upper and lower extremities. Good ROM, no contractures. Normal muscle tone.  Skin: no rashes, lesions, ulcers. No induration Neurologic: CN 2-12 grossly intact.  Strength 5/5 in all 4.  Psychiatric: Normal judgment and insight. Alert and oriented x 3. Normal mood.   Labs on Admission: I have personally reviewed following labs and imaging studies  CBC: Recent Labs  Lab 07/24/18 1604  WBC 6.5  HGB 11.3*  HCT 35.9*  MCV 85.9  PLT 312   Basic Metabolic Panel: Recent Labs  Lab 07/24/18 1604  NA  136  K 4.2  CL 101  CO2 26  GLUCOSE 209*  BUN 24*  CREATININE 0.96  CALCIUM 8.9   Coagulation Profile: Recent Labs  Lab 07/24/18 1604  INR 1.01   Cardiac Enzymes: Recent Labs  Lab 07/24/18 1604  TROPONINI 0.62*   CBG: Recent Labs  Lab 07/17/18 2128 07/18/18 0725 07/18/18 1122  GLUCAP 233* 119* 139*    Radiological Exams on Admission: Dg Chest 2 View  Result Date: 07/24/2018 CLINICAL DATA:  Mid chest pain radiating into the left chest. History of COPD and hypertension. EXAM: CHEST - 2 VIEW COMPARISON:  07/16/2018 and 07/13/2017 radiographs. FINDINGS: Stable mild cardiomegaly. There is vascular congestion with improvement in the interstitial opacity seen on the recent prior study, consistent with resolving edema. There are small residual bilateral pleural effusions. No confluent airspace opacity or pneumothorax. The bones appear unchanged. Telemetry leads overlie the chest. IMPRESSION: Improved edema compared with prior study of 8 days ago. Small bilateral pleural effusions. Electronically Signed   By: Carey Bullocks M.D.   On: 07/24/2018 15:59    EKG: Independently reviewed.  Sinus rhythm.  Normal intervals QTC 470.  No ST or T wave changes.   Assessment/Plan Principal Problem:    Chest pain Active Problems:   OBESITY, MORBID   Essential hypertension   Bipolar I disorder, current or most recent episode manic, with psychotic features with mixed features (HCC)   Type 2 diabetes mellitus with hyperlipidemia (HCC)   Hypothyroidism   NSTEMI-atypical chest pain but with typical features relieved with nitroglycerin. Troponin 0.62.  EKG unchanged from prior without ST or T wave abnormalities.  High risk-with morbid obesity hypertension, DM.  Recent admission 10/21 -low risk stress test. EDP talked to cardiologist on-call Dr. Jacques Navy, no beds at [facility], recommended hospitalist admission here, pending troponin trend will need to reconsult cardiology. -Heparin GTT per pharmacy -Aspirin 325 X 1 - Resume Lipitor 80mg  daily,  - metoprolol 12.5mg  BID - Morphine, PRN nitro -Troponin x2 -EKG a.m. -Recent Echo- 10/21-EF 65 to 70%, G2DD, significant valvular abnormalities -Cardiology consult in a.m. order placed -If troponin continues to trends up will need to reconsult cardiology for possible transfer to [facility]. Will sign out to night team.  Diastolic CHF-stable. Echo 10/21-EF 65 to 70%, G2DD, significant valvular abnormalities -Resume low-dose Lasix 20 daily, Imdur  HTN-stable. -Resume home Norvasc -Start low-dose metoprolol for NSTEMI  DM-glucose 209. Hgba1c 07/17/18- 10.7.  Reports she is on 70/30 insulin sliding scale, and glipizide - SSI  Bipolar disorder-  -Continue home BuSpar and escitalopram  Obesity  DVT prophylaxis: Heparin Code Status: Full Family Communication: None at bedside Disposition Plan: Per rounding team Consults called: Cardiology consult Admission status: Obs, tele   Onnie Boer MD [facility] Pager [phone] From 3PM-11PM.  Otherwise please contact night-coverage [email or web address] Password [email or web address]  07/24/2018, 10:20 PM

## 2018-07-24 NOTE — Progress Notes (Signed)
CRITICAL VALUE ALERT  Critical Value:  Trop 4.03  Date & Time Notied:  07/24/18 2310  Provider Notified: Selena Batten MD  Orders Received/Actions taken: MD notified

## 2018-07-24 NOTE — ED Provider Notes (Addendum)
[facility] EMERGENCY DEPARTMENT Provider Note   CSN: [id number] Arrival date & time: 07/24/18  1510     History   Chief Complaint Chief Complaint  Patient presents with  . Chest Pain    HPI [name] is a 57 y.o. female.  Chief complaint chest pain.  Patient admitted on 07/16/2018 for chest pain and pulmonary edema.  She was discharged home 6 days ago.  She has had intermittent chest pain since that time, worse since noon today.  Pain is described as a "horse standing on her chest", with associated dyspnea.  She was given nitroglycerin x2 tablets by EMS and seemed to help.  Cardiac risk factors include diabetes, hypertension, hypercholesterolemia, obesity.  Severity of symptoms is moderate.  Symptoms are apparent without exertion.     Past Medical History:  Diagnosis Date  . Asthma   . Back pain   . Chest pain   . Diabetes mellitus type II   . Headache   . High cholesterol   . Hypertension   . Obesity, morbid (HCC)   . Other and unspecified bipolar disorders   . 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 Thyroid disease     Patient Active Problem List   Diagnosis Date Noted  . Type 2 diabetes mellitus with hyperlipidemia (HCC) 07/18/2018  . Hypothyroidism 07/18/2018  . Chest pain 07/17/2018  . Bipolar I disorder, current or most recent episode manic, with psychotic features with mixed features (HCC) 07/04/2018  . Bipolar disorder, unspecified (HCC) 02/28/2012  . Bipolar 1 disorder, depressed (HCC) 12/27/2011  . OBESITY, MORBID 07/07/2009  . OTHER AND UNSPECIFIED BIPOLAR DISORDERS 07/07/2009  . Essential hypertension 07/07/2009  . ASTHMA 07/07/2009  . CHEST PAIN 07/07/2009    Past Surgical History:  Procedure Laterality Date  . TUBAL LIGATION     Bilateral     OB History    Gravida  2   Para  2   Term  2   Preterm      AB      Living  2     SAB      TAB      Ectopic      Multiple      Live Births               Home Medications     Prior to Admission medications   Medication Sig Start Date End Date Taking? Authorizing Provider  albuterol (PROVENTIL HFA;VENTOLIN HFA) 108 (90 Base) MCG/ACT inhaler Inhale 2 puffs into the lungs every 6 (six) hours as needed for wheezing or shortness of breath. 07/07/18   Money, Gerlene Burdock, FNP  amLODipine (NORVASC) 10 MG tablet Take 1 tablet (10 mg total) by mouth daily. For high blood pressure 07/07/18   Money, Gerlene Burdock, FNP  atorvastatin (LIPITOR) 80 MG tablet Take 1 tablet (80 mg total) by mouth every morning. Patient taking differently: Take 80 mg by mouth daily at 6 PM.  07/07/18   Money, Gerlene Burdock, FNP  escitalopram (LEXAPRO) 20 MG tablet Take 1 tablet (20 mg total) by mouth 2 (two) times daily. for mood control 07/10/18   Myrlene Broker, MD  furosemide (LASIX) 20 MG tablet Take 1 tablet (20 mg total) by mouth daily. 07/18/18 07/18/19  Erick Blinks, MD  glipiZIDE (GLUCOTROL) 10 MG tablet Take 1 tablet (10 mg total) by mouth 2 (two) times daily before a meal. 07/07/18   Money, Gerlene Burdock, FNP  hydrOXYzine (ATARAX/VISTARIL) 50 MG tablet Take 1 tablet (  50 mg total) by mouth every 6 (six) hours as needed for anxiety. 07/10/18   Myrlene Broker, MD  insulin aspart (NOVOLOG) 100 UNIT/ML injection Inject 10 Units into the skin 3 (three) times daily with meals. Patient taking differently: 3 (three) times daily with meals. Sliding scale 07/07/18   Money, Gerlene Burdock, FNP  insulin glargine (LANTUS) 100 UNIT/ML injection Inject 0.85 mLs (85 Units total) into the skin 2 (two) times daily. 07/18/18   Erick Blinks, MD  isosorbide mononitrate (IMDUR) 30 MG 24 hr tablet Take 1 tablet (30 mg total) by mouth daily. 07/18/18 07/18/19  Erick Blinks, MD  levothyroxine (SYNTHROID, LEVOTHROID) 50 MCG tablet Take 1 tablet (50 mcg total) by mouth daily before breakfast. For hypothyroidism 07/07/18   Money, Gerlene Burdock, FNP  pantoprazole (PROTONIX) 40 MG tablet Take 1 tablet (40 mg total) by mouth daily. 07/18/18  07/18/19  Erick Blinks, MD  potassium chloride 20 MEQ TBCR Take 20 mEq by mouth daily. 07/18/18   Erick Blinks, MD  risperiDONE (RISPERDAL) 1 MG tablet Take one in the am and two at bedtime 07/10/18   Myrlene Broker, MD  traZODone (DESYREL) 50 MG tablet Take 1 tablet (50 mg total) by mouth at bedtime as needed for sleep. 07/10/18   Myrlene Broker, MD    Family History Family History  Problem Relation Age of Onset  . Heart attack Maternal Uncle   . Depression Maternal Uncle   . Depression Mother   . Alcohol abuse Maternal Grandfather   . Alcohol abuse Maternal Uncle   . Depression Maternal Grandmother     Social History Social History   Tobacco Use  . Smoking status: Never Smoker  . Smokeless tobacco: Never Used  Substance Use Topics  . Alcohol use: No    Alcohol/week: 0.0 standard drinks  . Drug use: No    Comment: 05/13/2016 per pt no     Allergies   Amoxicillin; Doxycycline; Levofloxacin; and Propoxyphene n-acetaminophen   Review of Systems Review of Systems  All other systems reviewed and are negative.    Physical Exam Updated Vital Signs BP (!) 148/82   Pulse 71   Temp 98.5 F (36.9 C) (Oral)   Resp 15   Ht 5\' 4"  (1.626 m)   Wt (!) 148.3 kg   SpO2 97%   BMI 56.13 kg/m   Physical Exam  Constitutional: She is oriented to person, place, and time.  Obese, no acute distress  HENT:  Head: Normocephalic and atraumatic.  Eyes: Conjunctivae are normal.  Neck: Neck supple.  Cardiovascular: Normal rate and regular rhythm.  Pulmonary/Chest: Effort normal and breath sounds normal.  Abdominal: Soft. Bowel sounds are normal.  Musculoskeletal: Normal range of motion.  Neurological: She is alert and oriented to person, place, and time.  Skin: Skin is warm and dry.  Psychiatric: She has a normal mood and affect. Her behavior is normal.  Nursing note and vitals reviewed.    ED Treatments / Results  Labs (all labs ordered are listed, but only abnormal  results are displayed) Labs Reviewed  BASIC METABOLIC PANEL - Abnormal; Notable for the following components:      Result Value   Glucose, Bld 209 (*)    BUN 24 (*)    All other components within normal limits  CBC - Abnormal; Notable for the following components:   Hemoglobin 11.3 (*)    HCT 35.9 (*)    All other components within normal limits  TROPONIN I -  Abnormal; Notable for the following components:   Troponin I 0.62 (*)    All other components within normal limits  APTT  PROTIME-INR    EKG None  Radiology Dg Chest 2 View  Result Date: 07/24/2018 CLINICAL DATA:  Mid chest pain radiating into the left chest. History of COPD and hypertension. EXAM: CHEST - 2 VIEW COMPARISON:  07/16/2018 and 07/13/2017 radiographs. FINDINGS: Stable mild cardiomegaly. There is vascular congestion with improvement in the interstitial opacity seen on the recent prior study, consistent with resolving edema. There are small residual bilateral pleural effusions. No confluent airspace opacity or pneumothorax. The bones appear unchanged. Telemetry leads overlie the chest. IMPRESSION: Improved edema compared with prior study of 8 days ago. Small bilateral pleural effusions. Electronically Signed   By: Carey Bullocks M.D.   On: 07/24/2018 15:59    Procedures Procedures (including critical care time)  Medications Ordered in ED Medications  heparin injection 4,000 Units (has no administration in time range)  nitroGLYCERIN (NITROSTAT) SL tablet 0.4 mg (0.4 mg Sublingual Given 07/24/18 1634)  nitroGLYCERIN (NITROSTAT) SL tablet 0.4 mg (0.4 mg Sublingual Given 07/24/18 1808)     Initial Impression / Assessment and Plan / ED Course  I have reviewed the triage vital signs and the nursing notes.  Pertinent labs & imaging results that were available during my care of the patient were reviewed by me and considered in my medical decision making (see chart for details).     Recent admission for chest  pain and pulmonary edema.  Patient now complains of continued chest pain relieved by nitroglycerin today.  She has multiple cardiac risk factors.  Will discuss with cardiology.  1715: Discussed with cardiologist.  No bed availability at [facility].  Patient is hemodynamically stable.  We will keep it in [facility] for the time being.  Will start heparin and aspirin.   CRITICAL CARE Performed by: Donnetta Hutching Total critical care time: 30 minutes Critical care time was exclusive of separately billable procedures and treating other patients. Critical care was necessary to treat or prevent imminent or life-threatening deterioration. Critical care was time spent personally by me on the following activities: development of treatment plan with patient and/or surrogate as well as nursing, discussions with consultants, evaluation of patient's response to treatment, examination of patient, obtaining history from patient or surrogate, ordering and performing treatments and interventions, ordering and review of laboratory studies, ordering and review of radiographic studies, pulse oximetry and re-evaluation of patient's condition.  Final Clinical Impressions(s) / ED Diagnoses   Final diagnoses:  Chest pain, unspecified type    ED Discharge Orders    None       Donnetta Hutching, MD 07/24/18 1624    Donnetta Hutching, MD 07/24/18 1743    Donnetta Hutching, MD 07/24/18 [phone]

## 2018-07-24 NOTE — ED Notes (Addendum)
Pharmacy to do kinetics on Heparin Drip per Dr Adriana Simas.

## 2018-07-24 NOTE — ED Triage Notes (Signed)
Discharge on last Tuesday from [facility].  Chest pain on and off since Tuesday.  Rates pain 0/10 due 2 ntg sl via EMS and given 324 mg of ASA.

## 2018-07-24 NOTE — ED Notes (Signed)
Date and time results received: 07/24/18 5:04 PM  (use smartphrase ".now" to insert current time)  Test: trop Critical Value: 0.69 Name of Provider Notified: Dr Adriana Simas  Orders Received? Or Actions Taken?:

## 2018-07-24 NOTE — Discharge Summary (Addendum)
Physician Discharge Summary  [name] NTI:[id number] DOB: January 25, 1961 DOA: 07/24/2018  PCP: Barbie Banner, MD  Admit date: 07/24/2018 Discharge date: 07/25/2018  Admitted From: Home Disposition:  Transfer to [facility].  Discharge Condition: FAIR CODE STATUS:FULL   HPI on admission-  Chief Complaint: Chest Pain  HPI: [name] is a 57 y.o. female with medical history significant for Morbid obesity, HTN, BPD, DM, hypothyroidism, asthma.  Patient presented to the ED with complaints of chest pain intermittent since recent hospital admission a week ago.  Chest pain today, started at about 12 noon when she was at home, and has been the most severe since onset.  Described as "horse sitting on her chest", intermittently relieved only by nitroglycerin given in the ED. Chest pain radiates into her right upper arm, with associated shortness of breath, palpitations diaphoresis and nausea without vomiting. Family history of heart attack in grandparents and uncle.  No personal or family history of blood clots.  No lower extremity swelling or redness.  No cough, no fevers.  Since hospital admission 10/21- 10/22-patient presented with chest pain shortness of breath, had stress test and echocardiogram read as low risk, echo with diastolic dysfunction. Echo- 10/21-EF 65 to 70%, G2DD, no significant valvular abnormalities. She was discharged home with low-dose Lasix and Imdur, with Protonix.  ED Course: Blood pressure systolic 130s to 867Y.  Creatinine elevated 0.62.  EKG without ST or T wave abnormalities unchanged.  Unremarkable CBC BMP.  Cardiologist on call, Dr. Jacques Navy was consulted no beds at [facility], recommended hospitalist admission here, pending troponin trend, will need to reconsult cardiology.   Discharge Diagnoses:  Principal Problem:   NSTEMI (non-ST elevated myocardial infarction) (HCC) Active Problems:   OBESITY, MORBID   Essential hypertension   Bipolar I  disorder, current or most recent episode manic, with psychotic features with mixed features (HCC)   Chest pain   Type 2 diabetes mellitus with hyperlipidemia (HCC)   Hypothyroidism  NSTEMI- atypical chest pain with typical features- relieved by nitroglycerin. Troponin 0.62 >> 4.03.  EKG on admission, unchanged from prior without ST or T wave abnormalities.  High risk-with morbid obesity hypertension, DM.  Recent admission 10/21 -low risk stress test. EDP talked to cardiologist on-call Dr. Jacques Navy, no beds at [facility], recommended hospitalist admission here at [facility].  With marked jump in troponin to 4.03. I talked to cardiologist on-call Dr. Mayford Knife who looked at stress images again, and read it as low risk, recommended starting nitroglycerin drip (could not be started prior to transfer, requires step down admission), getting CTA chest to ensure no pulmonary embolism-considering recent stress test findings,  and she recommended transfer to another hospital as there are still no beds at [facility] and there is a long cardiology list of patients awaiting transfer.   Talked to cardiologist on call at [facility]- Dr. Charlann Noss, who has graciously accepted patient in transfer.  Patient will be transferred to [facility]- she is currently on IV heparin drip, nitroglycerin patch, CTA chest ordered and pending, lipitor 80 mg, aspirin 325 given, low-dose metoprolol started 12.5 mg twice daily, PRN morphine.   Addendum- CTA chest Result- No CT evidence of central pulmonary artery embolus. 2. Mild cardiomegaly with findings of CHF and small bilateral pleural effusions. Superimposed pneumonia is not excluded. Clinical correlation is recommended.  Diastolic CHF-stable. Echo 10/21-EF 65 to 70%, G2DD, no significant valvular abnormalities -Resume low-dose Lasix 20 daily, Imdur  HTN-stable. -Hold norvasc for now, and  Start low-dose metoprolol for NSTEMI, nitroglycerin patch.  DM-glucose 209. Hgba1c  07/17/18- 10.7.  Reports she is on 70/30 insulin sliding scale, and glipizide. - SSI  Bipolar disorder-  -Continue home BuSpar and escitalopram  Morbid Obesity   Discharge Instructions  Discharge Instructions    Diet - low sodium heart healthy   Complete by:  As directed    Increase activity slowly   Complete by:  As directed      Allergies as of 07/25/2018      Reactions   Amoxicillin Swelling, Other (See Comments)   Oral swelling   Doxycycline Nausea And Vomiting   Levofloxacin Other (See Comments)   headache   Propoxyphene N-acetaminophen Nausea And Vomiting      Medication List    TAKE these medications   albuterol 108 (90 Base) MCG/ACT inhaler Commonly known as:  PROVENTIL HFA;VENTOLIN HFA Inhale 2 puffs into the lungs every 6 (six) hours as needed for wheezing or shortness of breath.   amLODipine 10 MG tablet Commonly known as:  NORVASC Take 1 tablet (10 mg total) by mouth daily. For high blood pressure   atorvastatin 80 MG tablet Commonly known as:  LIPITOR Take 1 tablet (80 mg total) by mouth every morning. What changed:  when to take this   busPIRone 30 MG tablet Commonly known as:  BUSPAR Take 30 mg by mouth 3 (three) times daily.   escitalopram 20 MG tablet Commonly known as:  LEXAPRO Take 1 tablet (20 mg total) by mouth 2 (two) times daily. for mood control   furosemide 20 MG tablet Commonly known as:  LASIX Take 1 tablet (20 mg total) by mouth daily.   glipiZIDE 10 MG tablet Commonly known as:  GLUCOTROL Take 1 tablet (10 mg total) by mouth 2 (two) times daily before a meal.   hydrOXYzine 50 MG tablet Commonly known as:  ATARAX/VISTARIL Take 1 tablet (50 mg total) by mouth every 6 (six) hours as needed for anxiety.   insulin aspart 100 UNIT/ML injection Commonly known as:  novoLOG Inject 10 Units into the skin 3 (three) times daily with meals. What changed:    how much to take  how to take this  additional instructions    insulin glargine 100 UNIT/ML injection Commonly known as:  LANTUS Inject 0.85 mLs (85 Units total) into the skin 2 (two) times daily.   isosorbide mononitrate 30 MG 24 hr tablet Commonly known as:  IMDUR Take 1 tablet (30 mg total) by mouth daily.   levothyroxine 50 MCG tablet Commonly known as:  SYNTHROID, LEVOTHROID Take 1 tablet (50 mcg total) by mouth daily before breakfast. For hypothyroidism   pantoprazole 40 MG tablet Commonly known as:  PROTONIX Take 1 tablet (40 mg total) by mouth daily.   Potassium Chloride ER 20 MEQ Tbcr Take 20 mEq by mouth daily.   risperiDONE 1 MG tablet Commonly known as:  RISPERDAL Take one in the am and two at bedtime What changed:    how much to take  how to take this  when to take this  additional instructions   traZODone 50 MG tablet Commonly known as:  DESYREL Take 1 tablet (50 mg total) by mouth at bedtime as needed for sleep. What changed:  when to take this       Allergies  Allergen Reactions  . Amoxicillin Swelling and Other (See Comments)    Oral swelling   . Doxycycline Nausea And Vomiting  . Levofloxacin Other (See Comments)  headache  . Propoxyphene N-Acetaminophen Nausea And Vomiting    Consultations:  CArdiology   Procedures/Studies: Dg Chest 2 View  Result Date: 07/24/2018 CLINICAL DATA:  Mid chest pain radiating into the left chest. History of COPD and hypertension. EXAM: CHEST - 2 VIEW COMPARISON:  07/16/2018 and 07/13/2017 radiographs. FINDINGS: Stable mild cardiomegaly. There is vascular congestion with improvement in the interstitial opacity seen on the recent prior study, consistent with resolving edema. There are small residual bilateral pleural effusions. No confluent airspace opacity or pneumothorax. The bones appear unchanged. Telemetry leads overlie the chest. IMPRESSION: Improved edema compared with prior study of 8 days ago. Small bilateral pleural effusions. Electronically Signed   By:  Carey Bullocks M.D.   On: 07/24/2018 15:59   Dg Chest 2 View  Result Date: 07/16/2018 CLINICAL DATA:  Shortness of Breath EXAM: CHEST - 2 VIEW COMPARISON:  07/13/2017 FINDINGS: Cardiomegaly. Diffuse interstitial opacities throughout the lungs, likely interstitial edema. Small bilateral effusions. No acute bony abnormality. IMPRESSION: Cardiomegaly with diffuse interstitial opacities, likely edema/CHF. Small effusions. Electronically Signed   By: Charlett Nose M.D.   On: 07/16/2018 22:23   Nm Myocar Multi W/spect W/wall Motion / Ef  Result Date: 07/18/2018  There was no ST segment deviation noted during stress.  Defect 1: There is a small defect of mild severity present in the mid anteroseptal and apical anterior location. This is due to soft tissue attenuation. There are no significant ischemic territories.  The study is normal.  This is a low risk study.  Nuclear stress EF: 64%.  Nonspecific T wave abnormalities inferiorly throughout study.     Subjective: no complains at this time.   Discharge Exam: Vitals:   07/24/18 2222 07/25/18 0000  BP: 138/79 134/64  Pulse: 70 71  Resp: 20 16  Temp: 98.5 F (36.9 C) 98.2 F (36.8 C)  SpO2: 97% 97%   Vitals:   07/24/18 2130 07/24/18 2145 07/24/18 2222 07/25/18 0000  BP: (!) 141/70 (!) 143/65 138/79 134/64  Pulse: 74 73 70 71  Resp: (!) 21 (!) 27 20 16   Temp:   98.5 F (36.9 C) 98.2 F (36.8 C)  TempSrc:   Oral Oral  SpO2: 94% 95% 97% 97%  Weight:      Height:        General: Pt is alert, awake, not in acute distress Cardiovascular: RRR, S1/S2 +, no rubs, no gallops Respiratory: CTA bilaterally, no wheezing, no rhonchi Abdominal: Soft, NT, ND, bowel sounds + Extremities: no edema, no cyanosis   The results of significant diagnostics from this hospitalization (including imaging, microbiology, ancillary and laboratory) are listed below for reference.     Labs: BNP (last 3 results) Recent Labs    07/16/18 2324  BNP  73.0   Basic Metabolic Panel: Recent Labs  Lab 07/24/18 1604  NA 136  K 4.2  CL 101  CO2 26  GLUCOSE 209*  BUN 24*  CREATININE 0.96  CALCIUM 8.9   CBC: Recent Labs  Lab 07/24/18 1604  WBC 6.5  HGB 11.3*  HCT 35.9*  MCV 85.9  PLT 312   Cardiac Enzymes: Recent Labs  Lab 07/24/18 1604 07/24/18 2203  TROPONINI 0.62* 4.03*   BNP: Invalid input(s): POCBNP CBG: Recent Labs  Lab 07/18/18 0725 07/18/18 1122 07/24/18 2220 07/25/18 0000  GLUCAP 119* 139* 259* 297*    Time coordinating discharge: Over 30 minutes  SIGNED:  07/27/18, MD  [facility] 07/25/2018, 12:08 AM

## 2018-07-24 NOTE — ED Notes (Signed)
Dx one week ago with CHF and spent 2 nights at [facility].  Discharged on Tuesday on Lasix.  Today c/o mid-sternum chest pain rating pain 7/10 with nausea per EMS. Took 800 mg of ibuprofen at home.

## 2018-07-24 NOTE — ED Notes (Signed)
Given dinner tray

## 2018-07-25 DIAGNOSIS — R0789 Other chest pain: Secondary | ICD-10-CM | POA: Diagnosis present

## 2018-07-25 DIAGNOSIS — I214 Non-ST elevation (NSTEMI) myocardial infarction: Secondary | ICD-10-CM | POA: Diagnosis not present

## 2018-07-25 DIAGNOSIS — I110 Hypertensive heart disease with heart failure: Secondary | ICD-10-CM | POA: Diagnosis not present

## 2018-07-25 DIAGNOSIS — E039 Hypothyroidism, unspecified: Secondary | ICD-10-CM | POA: Diagnosis not present

## 2018-07-25 DIAGNOSIS — F312 Bipolar disorder, current episode manic severe with psychotic features: Secondary | ICD-10-CM | POA: Diagnosis not present

## 2018-07-25 DIAGNOSIS — Z79899 Other long term (current) drug therapy: Secondary | ICD-10-CM | POA: Diagnosis not present

## 2018-07-25 DIAGNOSIS — J45909 Unspecified asthma, uncomplicated: Secondary | ICD-10-CM | POA: Diagnosis not present

## 2018-07-25 DIAGNOSIS — E785 Hyperlipidemia, unspecified: Secondary | ICD-10-CM | POA: Diagnosis not present

## 2018-07-25 DIAGNOSIS — Z794 Long term (current) use of insulin: Secondary | ICD-10-CM | POA: Diagnosis not present

## 2018-07-25 DIAGNOSIS — E119 Type 2 diabetes mellitus without complications: Secondary | ICD-10-CM | POA: Diagnosis not present

## 2018-07-25 DIAGNOSIS — E668 Other obesity: Secondary | ICD-10-CM | POA: Diagnosis not present

## 2018-07-25 DIAGNOSIS — I5030 Unspecified diastolic (congestive) heart failure: Secondary | ICD-10-CM | POA: Diagnosis not present

## 2018-07-25 LAB — GLUCOSE, CAPILLARY: GLUCOSE-CAPILLARY: 297 mg/dL — AB (ref 70–99)

## 2018-07-25 MED ORDER — NITROGLYCERIN 0.4 MG/HR TD PT24
0.4000 mg | MEDICATED_PATCH | Freq: Every day | TRANSDERMAL | Status: DC
  Administered 2018-07-25: 0.4 mg via TRANSDERMAL
  Filled 2018-07-25 (×4): qty 1

## 2018-07-25 NOTE — Progress Notes (Signed)
PT transported to [facility] via [place]. No s/s of distress. Heparin infusing. 2 IVs in place. PT A&Ox4. PT notified daughters of bed and transfer location.

## 2018-07-25 NOTE — Progress Notes (Addendum)
PT transported to CT for Chest Angio CT. No s/s of chest pain or distress. Transported back to room. Continue to monitor.

## 2018-07-25 NOTE — Progress Notes (Signed)
Report given to nurse on [facility]. Report given to [facility] RN at [facility] for room 712 at [facility].

## 2018-08-08 ENCOUNTER — Ambulatory Visit (HOSPITAL_COMMUNITY): Payer: Medicare HMO | Admitting: Psychiatry

## 2018-08-14 ENCOUNTER — Ambulatory Visit (HOSPITAL_COMMUNITY): Payer: Medicare HMO | Admitting: Psychiatry

## 2018-09-12 NOTE — Progress Notes (Signed)
Cardiology Office Note    Date:  09/13/2018   ID:  [name], DOB 1961-09-26, MRN [id number]  PCP:  Barbie Banner, MD  Cardiologist: Prentice Docker, MD    Chief Complaint  Patient presents with  . Hospitalization Follow-up    s/p CABG    History of Present Illness:    [name] is a 57 y.o. female with past medical history of HTN, HLD, Type 2 DM, asthma, and bipolar disorder who presents to the office today for hospital follow-up.  She was recently admitted to [facility] on 07/17/2018 for evaluation of worsening chest pain and dyspnea. BNP was normal at 73 with initial and cyclic troponin values being negative. Echocardiogram showed a preserved EF of 65-70% with no regional WMA and a Lexiscan Myoview was obtained for further ischemic evaluation and showed soft tissue attenuation with no significant ischemia or prior infarct, overall being a low-risk study. She was discharged on Lasix 20mg  daily with outpatient follow-up arranged.   In the interim, she presented back to [facility] on 07/24/2018 for recurrent chest pain and was found to have an NSTEMI with troponin values at 0.62 and repeat value trending up to 4.03. CTA showed no evidence of a PE and given that no beds were available at [facility], she was transferred to [facility] for further evaluation. Catheterization was performed and showed 3-vessel CAD with CABG recommended. She underwent 4-vessel CABG on 07/28/2018 with SVG-PDA, SVG-RI, seq LIMA-mid-LAD-distal LAD. By review of the discharge summary, she overall progressed well during admission and was discharged to SNF for rehabilitation. She did follow-up with CT Surgery in the outpatient setting on 08/31/2018 and was overall doing well from a cardiac perspective but did have a productive cough and was prescribed Azithromycin. Was continued on Plavix, Amlodipine, Lasix, and Lopressor.   In talking with the patient and her daughter today, she reports overall doing  well since her recent surgery. She continues to experience incisional pain but this improves with the use of Tylenol. Denies any exertional chest discomfort. She was not overly active prior to surgery but is trying to walk back and forth to her mailbox daily for exercise. She had initially been discharged to SNF after bypass but has now returned home. She is looking forward to starting cardiac rehab.  She denies any recent orthopnea, PND, lower extremity edema, or palpitations. Does have baseline dyspnea on exertion but reports this did improve following bypass.  Past Medical History:  Diagnosis Date  . Asthma   . Back pain   . CAD (coronary artery disease)    a. 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   . Chest pain   . Diabetes mellitus type II   . Headache   . High cholesterol   . Hypertension   . Obesity, morbid (HCC)   . Other and unspecified bipolar disorders   . 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 Thyroid disease     Past Surgical History:  Procedure Laterality Date  . TUBAL LIGATION     Bilateral    Current Medications: Outpatient Medications Prior to Visit  Medication Sig Dispense Refill  . albuterol (PROVENTIL HFA;VENTOLIN HFA) 108 (90 Base) MCG/ACT inhaler Inhale 2 puffs into the lungs every 6 (six) hours as needed for wheezing or shortness of breath. 1 Inhaler 0  . amLODipine (NORVASC) 10 MG tablet Take  1 tablet (10 mg total) by mouth daily. For high blood pressure 30 tablet 0  . atorvastatin (LIPITOR) 80 MG tablet Take 1 tablet (80 mg total) by mouth every morning. (Patient taking differently: Take 80 mg by mouth daily at 6 PM. ) 30 tablet 0  . busPIRone (BUSPAR) 30 MG tablet Take 30 mg by mouth 3 (three) times daily.    Marland Kitchen escitalopram (LEXAPRO) 20 MG tablet Take 1 tablet (20 mg total) by mouth 2 (two) times daily. for mood control 180  tablet 2  . glipiZIDE (GLUCOTROL) 10 MG tablet Take 1 tablet (10 mg total) by mouth 2 (two) times daily before a meal. 60 tablet 0  . hydrOXYzine (ATARAX/VISTARIL) 50 MG tablet Take 1 tablet (50 mg total) by mouth every 6 (six) hours as needed for anxiety. 90 tablet 2  . insulin NPH-regular Human (70-30) 100 UNIT/ML injection Sliding scale    . levothyroxine (SYNTHROID, LEVOTHROID) 50 MCG tablet Take 1 tablet (50 mcg total) by mouth daily before breakfast. For hypothyroidism 30 tablet 0  . nitroGLYCERIN (NITROSTAT) 0.4 MG SL tablet Place under the tongue.    . pantoprazole (PROTONIX) 40 MG tablet Take 1 tablet (40 mg total) by mouth daily. 30 tablet 1  . risperiDONE (RISPERDAL) 1 MG tablet Take one in the am and two at bedtime (Patient taking differently: Take 1-2 mg by mouth See admin instructions. Take one (1mg  total) in the am and two (2mg  total) at bedtime) 270 tablet 2  . rizatriptan (MAXALT) 5 MG tablet Take by mouth.    . traZODone (DESYREL) 50 MG tablet Take 1 tablet (50 mg total) by mouth at bedtime as needed for sleep. (Patient taking differently: Take 50 mg by mouth at bedtime. ) 90 tablet 2  . clopidogrel (PLAVIX) 75 MG tablet Take by mouth.    . furosemide (LASIX) 20 MG tablet Take 1 tablet (20 mg total) by mouth daily. 30 tablet 11  . Metoprolol Tartrate 37.5 MG TABS Take by mouth.    . potassium chloride 20 MEQ TBCR Take 20 mEq by mouth daily. 30 tablet 1  . insulin aspart (NOVOLOG) 100 UNIT/ML injection Inject 10 Units into the skin 3 (three) times daily with meals. (Patient taking differently: 3 (three) times daily with meals. Sliding scale) 10 mL 0  . insulin glargine (LANTUS) 100 UNIT/ML injection Inject 0.85 mLs (85 Units total) into the skin 2 (two) times daily. (Patient not taking: Reported on 07/24/2018) 10 mL 0  . isosorbide mononitrate (IMDUR) 30 MG 24 hr tablet Take 1 tablet (30 mg total) by mouth daily. 30 tablet 11   No facility-administered medications prior to visit.        Allergies:   Amoxicillin; Doxycycline; Levofloxacin; and Propoxyphene n-acetaminophen   Social History   Socioeconomic History  . Marital status: Widowed    Spouse name: Not on file  . Number of children: Not on file  . Years of education: Not on file  . Highest education level: Not on file  Occupational History  . Occupation: Disabled    : UNEMPLOYED  Social Needs  . Financial resource strain: Not on file  . Food insecurity:    Worry: Not on file    Inability: Not on file  . Transportation needs:    Medical: Not on file    Non-medical: Not on file  Tobacco Use  . Smoking status: Never Smoker  . Smokeless tobacco: Never Used  Substance and Sexual Activity  . Alcohol use:  No    Alcohol/week: 0.0 standard drinks  . Drug use: No    Comment: 05/13/2016 per pt no  . Sexual activity: Yes    Birth control/protection: Surgical  Lifestyle  . Physical activity:    Days per week: Not on file    Minutes per session: Not on file  . Stress: Not on file  Relationships  . Social connections:    Talks on phone: Not on file    Gets together: Not on file    Attends religious service: Not on file    Active member of club or organization: Not on file    Attends meetings of clubs or organizations: Not on file    Relationship status: Not on file  Other Topics Concern  . Not on file  Social History Narrative   Married   No regular exercise     Family History:  The patient's family history includes Alcohol abuse in her maternal grandfather and maternal uncle; Depression in her maternal grandmother, maternal uncle, and mother; Heart attack in her maternal uncle.   Review of Systems:   Please see the history of present illness.     General:  No chills, fever, night sweats or weight changes.  Cardiovascular:  No edema, orthopnea, palpitations, paroxysmal nocturnal dyspnea. Positive for dyspnea on exertion and incisional chest pain.  Dermatological: No rash,  lesions/masses Respiratory: No cough, dyspnea Urologic: No hematuria, dysuria Abdominal:   No nausea, vomiting, diarrhea, bright red blood per rectum, melena, or hematemesis Neurologic:  No visual changes, wkns, changes in mental status. All other systems reviewed and are otherwise negative except as noted above.   Physical Exam:    VS:  BP 128/80   Pulse 83   Ht 5\' 4"  (1.626 m)   Wt (!) 320 lb 12.8 oz (145.5 kg)   SpO2 98%   BMI 55.07 kg/m    General: Well developed, obese Caucasian female appearing in no acute distress. Head: Normocephalic, atraumatic, sclera non-icteric, no xanthomas, nares are without discharge.  Neck: No carotid bruits. JVD not elevated.  Lungs: Respirations regular and unlabored, without wheezes or rales.  Heart: Regular rate and rhythm. No S3 or S4.  No murmur, no rubs, or gallops appreciated. Sternal incision appears well-healing with no significant erythema or drainage present.  Abdomen: Soft, non-tender, non-distended with normoactive bowel sounds. No hepatomegaly. No rebound/guarding. No obvious abdominal masses. Msk:  Strength and tone appear normal for age. No joint deformities or effusions. Extremities: No clubbing or cyanosis. No edema.  Distal pedal pulses are 2+ bilaterally. Neuro: Alert and oriented X 3. Moves all extremities spontaneously. No focal deficits noted. Psych:  Responds to questions appropriately with a normal affect. Skin: No rashes or lesions noted  Wt Readings from Last 3 Encounters:  09/13/18 (!) 320 lb 12.8 oz (145.5 kg)  07/24/18 (!) 327 lb (148.3 kg)  07/17/18 (!) 337 lb 11.9 oz (153.2 kg)     Studies/Labs Reviewed:   EKG:  EKG is not ordered today. Was obtained by CT Surgery on 08/31/2018. Will request records as tracing is not available in Care Everywhere.   Recent Labs: 07/16/2018: B Natriuretic Peptide 73.0 07/17/2018: ALT 38 09/13/2018: BUN 28; Creatinine, Ser 1.10; Hemoglobin 11.3; Platelets 347; Potassium 4.0;  Sodium 133   Lipid Panel    Component Value Date/Time   CHOL (H) 06/28/2009 0611    388        ATP III CLASSIFICATION:  <200     mg/dL  Desirable  200-239  mg/dL   Borderline High  >=371    mg/dL   High          TRIG 062 (H) 06/28/2009 0611   HDL 42 06/28/2009 0611   CHOLHDL 9.2 06/28/2009 0611   VLDL UNABLE TO CALCULATE IF TRIGLYCERIDE OVER 400 mg/dL 69/48/5462 7035   LDLCALC  06/28/2009 0611    UNABLE TO CALCULATE IF TRIGLYCERIDE OVER 400 mg/dL        Total Cholesterol/HDL:CHD Risk Coronary Heart Disease Risk Table                     Men   Women  1/2 Average Risk   3.4   3.3  Average Risk       5.0   4.4  2 X Average Risk   9.6   7.1  3 X Average Risk  23.4   11.0        Use the calculated Patient Ratio above and the CHD Risk Table to determine the patient's CHD Risk.        ATP III CLASSIFICATION (LDL):  <100     mg/dL   Optimal  009-381  mg/dL   Near or Above                    Optimal  130-159  mg/dL   Borderline  829-937  mg/dL   High  >169     mg/dL   Very High    Additional studies/ records that were reviewed today include:   Echocardiogram: 07/17/2018 Study Conclusions  - Left ventricle: The cavity size was normal. Wall thickness was   increased in a pattern of moderate LVH. Systolic function was   vigorous. The estimated ejection fraction was in the range of 65%   to 70%. Wall motion was normal; there were no regional wall   motion abnormalities. Features are consistent with a pseudonormal   left ventricular filling pattern, with concomitant abnormal   relaxation and increased filling pressure (grade 2 diastolic   dysfunction). Doppler parameters are consistent with high   ventricular filling pressure. - Aortic valve: Trileaflet; mildly thickened, mildly calcified   leaflets. There was very mild stenosis. Peak velocity (S): 208   cm/s. Mean gradient (S): 9 mm Hg. Valve area (VTI): 2 cm^2. Valve   area (Vmax): 2 cm^2. Valve area (Vmean): 1.95  cm^2.  NST: 07/18/2018  There was no ST segment deviation noted during stress.  Defect 1: There is a small defect of mild severity present in the mid anteroseptal and apical anterior location. This is due to soft tissue attenuation. There are no significant ischemic territories.  The study is normal.  This is a low risk study.  Nuclear stress EF: 64%.  Nonspecific T wave abnormalities inferiorly throughout study.  Cardiac Catheterization: 08/01/2018 HEMODYNAMICS AND CATH: Ejection fraction: 55%  Proximal LAD: 70% stenosis Mid LAD: 70% stenosis Distal LAD: 80% stenosis Ramus: 80% stenosis RCA: 70% stenosis Posterolateral: 70% stenosis   Discussed with patient (but may still be drowsy).  Discussed with family:  LCP via right radial for NSTEMI with concerns for subtle inf STE on  initial ECG. Severe 3vd (prox to mid and distal LAD with Diag, prox and  mid Ramus, distal Cx, prox RCA with some disease in RPL and RPDA), not  favorable for percutaneous revascularization. LVEDP 31. EBL < 5 cc,  prelude sync for hemostasis, no specimens.   Assessment:    1. Coronary  artery disease involving native coronary artery of native heart without angina pectoris   2. S/P CABG x 4   3. Essential hypertension   4. Hyperlipidemia LDL goal <70   5. IDDM (insulin dependent diabetes mellitus) (HCC)   6. Medication management      Plan:   In order of problems listed above:  1. CAD/Status Post CABG x4 - The patient had been admitted 2 weeks prior to her cardiac catheterization for chest pain and ruled out for ACS and stress test was low-risk. Presented back with an NSTEMI and required CABG x4 with SVG-PDA, SVG-RI, seq LIMA-mid-LAD-distal LAD.  - She has overall progressed well since surgery. Still having incisional pain but denies any exertional symptoms. Incision appears well-healing by examination today. She is also being followed by CT Surgery at [facility].  She was started on Plavix in  place of ASA at the time of hospital discharge and has remained on Plavix 75 mg daily. Will continue this along with beta-blocker and statin therapy. She was on Lasix 20 mg daily prior to surgery and will continue ay current dosing with potassium supplementation. Will recheck a CBC (for post-operative anemia) and BMET today. Will enter referral to Cardiac Rehab here at [facility].   2. HTN - BP is well controlled at 128/80 during today's visit. She is currently on Amlodipine 10 mg daily and Metoprolol Tartrate 37.5 mg once daily. She reports having dizziness after taking her morning medications and this could be due to her variable blood sugar readings but could also be due to her BP medications. She was previously on Metoprolol Succinate and tolerated this well without any noted side effects. I recommend stopping Metoprolol Tartrate and switching back to Toprol-XL 25 mg daily for sustained release to see if this would help with her symptoms.  3. HLD - Followed by PCP. She has remained on Atorvastatin 80 mg daily. Goal LDL is less than 70 in the setting of known CAD.  If not at goal, would consider referral to the Lipid Clinic for initiation of PSCK-9 inhibitor therapy.   4. IDDM - Being followed by PCP. Hemoglobin A1c was elevated to 10.7 when obtained in 06/2018.   Medication Adjustments/Labs and Tests Ordered: Current medicines are reviewed at length with the patient today.  Concerns regarding medicines are outlined above.  Medication changes, Labs and Tests ordered today are listed in the Patient Instructions below. Patient Instructions  Medication Instructions:  Your physician has recommended you make the following change in your medication:  Stop Taking Lopressor  Start Taking Toprol XL 25 mg Daily   If you need a refill on your cardiac medications before your next appointment, please call your pharmacy.   Lab work: Your physician recommends that you return for lab work in: Today  If  you have labs (blood work) drawn today and your tests are completely normal, you will receive your results only by: Marland Kitchen MyChart Message (if you have MyChart) OR . A paper copy in the mail If you have any lab test that is abnormal or we need to change your treatment, we will call you to review the results.  Testing/Procedures: NONE   Follow-Up: At [facility], you and your health needs are our priority.  As part of our continuing mission to provide you with exceptional heart care, we have created designated Provider Care Teams.  These Care Teams include your primary Cardiologist (physician) and Advanced Practice Providers (APPs -  Physician Assistants and Nurse Practitioners) who  all work together to provide you with the care you need, when you need it. You will need a follow up appointment in 3 months.  Please call our office 2 months in advance to schedule this appointment.  You may see Prentice Docker, MD or one of the following Advanced Practice Providers on your designated Care Team:   Randall An, PA-C [facility]) . Jacolyn Reedy, PA-C [facility] Office)  Any Other Special Instructions Will Be Listed Below (If Applicable).  You have been referred to Cardiac Rehab   Thank you for choosing [facility]!    Signed, Ellsworth Lennox, PA-C  09/13/2018 5:26 PM    [facility] [address] [place], [place] [address] Phone: [phone]

## 2018-09-13 ENCOUNTER — Encounter: Payer: Self-pay | Admitting: Student

## 2018-09-13 ENCOUNTER — Other Ambulatory Visit (HOSPITAL_COMMUNITY)
Admission: AD | Admit: 2018-09-13 | Discharge: 2018-09-13 | Disposition: A | Discharge status: Home / Self Care | Payer: Medicare HMO | Source: Other Acute Inpatient Hospital | Attending: Student | Admitting: Student

## 2018-09-13 ENCOUNTER — Ambulatory Visit (INDEPENDENT_AMBULATORY_CARE_PROVIDER_SITE_OTHER): Payer: Medicare HMO | Admitting: Student

## 2018-09-13 VITALS — BP 128/80 | HR 83 | Ht 64.0 in | Wt 320.8 lb

## 2018-09-13 DIAGNOSIS — Z79899 Other long term (current) drug therapy: Secondary | ICD-10-CM

## 2018-09-13 DIAGNOSIS — I2510 Atherosclerotic heart disease of native coronary artery without angina pectoris: Secondary | ICD-10-CM | POA: Diagnosis not present

## 2018-09-13 DIAGNOSIS — IMO0001 Reserved for inherently not codable concepts without codable children: Secondary | ICD-10-CM

## 2018-09-13 DIAGNOSIS — E119 Type 2 diabetes mellitus without complications: Secondary | ICD-10-CM

## 2018-09-13 DIAGNOSIS — I10 Essential (primary) hypertension: Secondary | ICD-10-CM | POA: Diagnosis not present

## 2018-09-13 DIAGNOSIS — Z951 Presence of aortocoronary bypass graft: Secondary | ICD-10-CM

## 2018-09-13 DIAGNOSIS — E785 Hyperlipidemia, unspecified: Secondary | ICD-10-CM | POA: Diagnosis not present

## 2018-09-13 DIAGNOSIS — Z5181 Encounter for therapeutic drug level monitoring: Secondary | ICD-10-CM | POA: Insufficient documentation

## 2018-09-13 DIAGNOSIS — Z794 Long term (current) use of insulin: Secondary | ICD-10-CM

## 2018-09-13 LAB — BASIC METABOLIC PANEL
ANION GAP: 9 (ref 5–15)
BUN: 28 mg/dL — AB (ref 6–20)
CO2: 27 mmol/L (ref 22–32)
Calcium: 9.3 mg/dL (ref 8.9–10.3)
Chloride: 97 mmol/L — ABNORMAL LOW (ref 98–111)
Creatinine, Ser: 1.1 mg/dL — ABNORMAL HIGH (ref 0.44–1.00)
GFR calc Af Amer: >60 mL/min (ref >60)
GFR calc non Af Amer: 55 mL/min — ABNORMAL LOW (ref >60)
GLUCOSE: 412 mg/dL — AB (ref 70–99)
POTASSIUM: 4 mmol/L (ref 3.5–5.1)
Sodium: 133 mmol/L — ABNORMAL LOW (ref 135–145)

## 2018-09-13 LAB — CBC WITH DIFFERENTIAL/PLATELET
ABS IMMATURE GRANULOCYTES: 0.01 10*3/uL (ref 0.00–0.07)
Basophils Absolute: 0 10*3/uL (ref 0.0–0.1)
Basophils Relative: 1 %
EOS ABS: 0.4 10*3/uL (ref 0.0–0.5)
Eosinophils Relative: 5 %
HEMATOCRIT: 35.4 % — AB (ref 36.0–46.0)
Hemoglobin: 11.3 g/dL — ABNORMAL LOW (ref 12.0–15.0)
IMMATURE GRANULOCYTES: 0 %
Lymphocytes Relative: 23 %
Lymphs Abs: 1.9 10*3/uL (ref 0.7–4.0)
MCH: 26.1 pg (ref 26.0–34.0)
MCHC: 31.9 g/dL (ref 30.0–36.0)
MCV: 81.8 fL (ref 80.0–100.0)
MONO ABS: 0.5 10*3/uL (ref 0.1–1.0)
MONOS PCT: 6 %
NEUTROS PCT: 65 %
Neutro Abs: 5.3 10*3/uL (ref 1.7–7.7)
Platelets: 347 10*3/uL (ref 150–400)
RBC: 4.33 MIL/uL (ref 3.87–5.11)
RDW: 13.7 % (ref 11.5–15.5)
WBC: 8 10*3/uL (ref 4.0–10.5)
nRBC: 0 % (ref 0.0–0.2)

## 2018-09-13 MED ORDER — FUROSEMIDE 20 MG PO TABS: 20.0000 mg | ORAL_TABLET | Freq: Every day | ORAL | 3 refills | Status: DC

## 2018-09-13 MED ORDER — POTASSIUM CHLORIDE ER 20 MEQ PO TBCR: 20.0000 meq | EXTENDED_RELEASE_TABLET | Freq: Every day | ORAL | 3 refills | Status: DC

## 2018-09-13 MED ORDER — METOPROLOL SUCCINATE ER 25 MG PO TB24: 25.0000 mg | ORAL_TABLET | Freq: Every day | ORAL | 3 refills | Status: DC

## 2018-09-13 MED ORDER — CLOPIDOGREL BISULFATE 75 MG PO TABS: 75.0000 mg | ORAL_TABLET | Freq: Every day | ORAL | 3 refills | Status: DC

## 2018-09-13 MED ORDER — CLOPIDOGREL BISULFATE 75 MG PO TABS: 75.0000 mg | ORAL_TABLET | Freq: Every day | ORAL | 0 refills | Status: DC

## 2018-09-13 NOTE — Patient Instructions (Signed)
Medication Instructions:  Your physician has recommended you make the following change in your medication:  Stop Taking Lopressor  Start Taking Toprol XL 25 mg Daily   If you need a refill on your cardiac medications before your next appointment, please call your pharmacy.   Lab work: Your physician recommends that you return for lab work in: Today  If you have labs (blood work) drawn today and your tests are completely normal, you will receive your results only by: Marland Kitchen MyChart Message (if you have MyChart) OR . A paper copy in the mail If you have any lab test that is abnormal or we need to change your treatment, we will call you to review the results.  Testing/Procedures: NONE   Follow-Up: At [facility], you and your health needs are our priority.  As part of our continuing mission to provide you with exceptional heart care, we have created designated Provider Care Teams.  These Care Teams include your primary Cardiologist (physician) and Advanced Practice Providers (APPs -  Physician Assistants and Nurse Practitioners) who all work together to provide you with the care you need, when you need it. You will need a follow up appointment in 3 months.  Please call our office 2 months in advance to schedule this appointment.  You may see Prentice Docker, MD or one of the following Advanced Practice Providers on your designated Care Team:   Randall An, PA-C [facility]) . Jacolyn Reedy, PA-C [facility] Office)  Any Other Special Instructions Will Be Listed Below (If Applicable).  You have been referred to Cardiac Rehab   Thank you for choosing [facility]!

## 2018-10-03 ENCOUNTER — Other Ambulatory Visit: Payer: Self-pay | Admitting: *Deleted

## 2018-10-03 DIAGNOSIS — Z951 Presence of aortocoronary bypass graft: Secondary | ICD-10-CM

## 2018-10-06 ENCOUNTER — Ambulatory Visit (INDEPENDENT_AMBULATORY_CARE_PROVIDER_SITE_OTHER): Payer: Medicare HMO | Admitting: Psychiatry

## 2018-10-06 ENCOUNTER — Encounter (HOSPITAL_COMMUNITY): Payer: Self-pay | Admitting: Psychiatry

## 2018-10-06 ENCOUNTER — Other Ambulatory Visit: Payer: Self-pay | Admitting: *Deleted

## 2018-10-06 VITALS — BP 126/85 | HR 84 | Ht 64.0 in | Wt 319.0 lb

## 2018-10-06 DIAGNOSIS — F3130 Bipolar disorder, current episode depressed, mild or moderate severity, unspecified: Secondary | ICD-10-CM

## 2018-10-06 MED ORDER — BUSPIRONE HCL 30 MG PO TABS: 30.0000 mg | ORAL_TABLET | Freq: Three times a day (TID) | ORAL | 2 refills | Status: DC

## 2018-10-06 MED ORDER — TRAZODONE HCL 100 MG PO TABS: 100.0000 mg | ORAL_TABLET | Freq: Every day | ORAL | 2 refills | Status: DC

## 2018-10-06 MED ORDER — ESCITALOPRAM OXALATE 20 MG PO TABS: 20.0000 mg | ORAL_TABLET | Freq: Two times a day (BID) | ORAL | 2 refills | Status: DC

## 2018-10-06 MED ORDER — RISPERIDONE 1 MG PO TABS: ORAL_TABLET | ORAL | 2 refills | Status: DC

## 2018-10-06 MED ORDER — POTASSIUM CHLORIDE ER 20 MEQ PO TBCR: 20.0000 meq | EXTENDED_RELEASE_TABLET | Freq: Every day | ORAL | 3 refills | Status: DC

## 2018-10-06 MED ORDER — HYDROXYZINE HCL 50 MG PO TABS: 50.0000 mg | ORAL_TABLET | Freq: Four times a day (QID) | ORAL | 2 refills | Status: DC | PRN

## 2018-10-06 NOTE — Progress Notes (Signed)
BH MD/PA/NP OP Progress Note  10/06/2018 11:21 AM [name]  MRN:  [id number]  Chief Complaint:  HPI: This patient is a 58 year oldwidowed white female who lives alonein [place]. She has 2 grown daughters and one 59 year old grandson. She is on disability for both back pain and bipolar disorder.  The patient was referred by Dr. Lolly MustacheArfeen from our [facility]. The patient would like to start coming here as it is too far for her to drive to the other clinic.  The patient states that she has had problems with mood since her mid 58. She began losing her temper having severe outburst mood swings and lashing out at people. 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 She began being paranoid and accusing people of doing things behind her back or talking about her. She was hospitalized 4 times the last time being at the behavioral [facility] hospital in 2011. At that time she was very depressed hallucinating and hearing command hallucinations to cut herself or kill herself. She was Stabilized on a combination of lithium Wellbutrin and Risperdal.She had seen Dr. Lolly MustacheArfeen for a while and had been transferred to me after it became too far to drive to the [facility].  The patient returns after 3 months.  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  She then was transferred to a nursing home for about a week.  She is come through all this fairly well.  She is about to start cardiac rehab.  She is now on Plavix.  Right before I saw her she had been experiencing hallucinations but she is no longer having these.  Her mood is fairly good but she claims she "stays anxious all the time" she is given hydroxyzine and takes 2 a day and I suggested she go up to 3 a day since it is a as needed.  Every time I have given her benzodiazepines she is overuse them so we are not going  to be able to go that route.  She also states that she is not sleeping well so we can increase her trazodone.  She denies being seriously depressed suicidal or having racing thoughts.  She is slowly starting to regain her activities. Visit Diagnosis:    ICD-10-CM   1. Bipolar I disorder, most recent episode depressed (HCC) F31.30     Past Psychiatric History: 4 previous hospitalizations for bipolar disorder, long-term outpatient treatment  Past Medical History:  Past Medical History:  Diagnosis Date  . Asthma   . Back pain   . CAD (coronary artery disease)    a. 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   . Chest pain   . Diabetes mellitus type II   . Headache   . High cholesterol   . Hypertension   . Obesity, morbid (HCC)   . Other and unspecified bipolar disorders   . 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 Thyroid disease     Past Surgical History:  Procedure Laterality Date  . TUBAL LIGATION     Bilateral    Family Psychiatric History: See below  Family History:  Family History  Problem Relation Age of Onset  . Heart attack Maternal Uncle   . Depression Maternal Uncle   . Depression  Mother   . Alcohol abuse Maternal Grandfather   . Alcohol abuse Maternal Uncle   . Depression Maternal Grandmother     Social History:  Social History   Socioeconomic History  . Marital status: Widowed    Spouse name: Not on file  . Number of children: Not on file  . Years of education: Not on file  . Highest education level: Not on file  Occupational History  . Occupation: Disabled    Associate Professormployer: UNEMPLOYED  Social Needs  . Financial resource strain: Not on file  . Food insecurity:    Worry: Not on file    Inability: Not on file  . Transportation needs:    Medical: Not on file    Non-medical: Not on file  Tobacco Use  . Smoking status: Never Smoker   . Smokeless tobacco: Never Used  Substance and Sexual Activity  . Alcohol use: No    Alcohol/week: 0.0 standard drinks  . Drug use: No    Comment: 05/13/2016 per pt no  . Sexual activity: Yes    Birth control/protection: Surgical  Lifestyle  . Physical activity:    Days per week: Not on file    Minutes per session: Not on file  . Stress: Not on file  Relationships  . Social connections:    Talks on phone: Not on file    Gets together: Not on file    Attends religious service: Not on file    Active member of club or organization: Not on file    Attends meetings of clubs or organizations: Not on file    Relationship status: Not on file  Other Topics Concern  . Not on file  Social History Narrative   Married   No regular exercise    Allergies:  Allergies  Allergen Reactions  . Amoxicillin Swelling and Other (See Comments)    Oral swelling   . Doxycycline Nausea And Vomiting  . Levofloxacin Other (See Comments)    headache  . Propoxyphene N-Acetaminophen Nausea And Vomiting    Metabolic Disorder Labs: Lab Results  Component Value Date   HGBA1C 10.7 (H) 07/17/2018   MPG 260 07/17/2018   MPG 269 (H) 10/14/2014   No results found for: PROLACTIN Lab Results  Component Value Date   CHOL (H) 06/28/2009    388        ATP III CLASSIFICATION:  <200     mg/dL   Desirable  161-096200-239  mg/dL   Borderline High  >=045>=240    mg/dL   High          TRIG 409789 (H) 06/28/2009   HDL 42 06/28/2009   CHOLHDL 9.2 06/28/2009   VLDL UNABLE TO CALCULATE IF TRIGLYCERIDE OVER 400 mg/dL 81/19/147810/10/2008   LDLCALC  29/56/213010/10/2008    UNABLE TO CALCULATE IF TRIGLYCERIDE OVER 400 mg/dL        Total Cholesterol/HDL:CHD Risk Coronary Heart Disease Risk Table                     Men   Women  1/2 Average Risk   3.4   3.3  Average Risk       5.0   4.4  2 X Average Risk   9.6   7.1  3 X Average Risk  23.4   11.0        Use the calculated Patient Ratio above and the CHD Risk Table to determine the  patient's CHD Risk.  ATP III CLASSIFICATION (LDL):  <100     mg/dL   Optimal  161-096  mg/dL   Near or Above                    Optimal  130-159  mg/dL   Borderline  045-409  mg/dL   High  >811     mg/dL   Very High   Lab Results  Component Value Date   TSH 4.88 (H) 02/03/2016   TSH 4.395 10/14/2014    Therapeutic Level Labs: Lab Results  Component Value Date   LITHIUM 0.6 (L) 07/07/2016   LITHIUM 0.6 (L) 03/10/2016   No results found for: VALPROATE No components found for:  CBMZ  Current Medications: Current Outpatient Medications  Medication Sig Dispense Refill  . albuterol (PROVENTIL HFA;VENTOLIN HFA) 108 (90 Base) MCG/ACT inhaler Inhale 2 puffs into the lungs every 6 (six) hours as needed for wheezing or shortness of breath. 1 Inhaler 0  . amLODipine (NORVASC) 10 MG tablet Take 1 tablet (10 mg total) by mouth daily. For high blood pressure 30 tablet 0  . atorvastatin (LIPITOR) 80 MG tablet Take 1 tablet (80 mg total) by mouth every morning. (Patient taking differently: Take 80 mg by mouth daily at 6 PM. ) 30 tablet 0  . busPIRone (BUSPAR) 30 MG tablet Take 30 mg by mouth 3 (three) times daily.    . clopidogrel (PLAVIX) 75 MG tablet Take 1 tablet (75 mg total) by mouth daily. 30 tablet 0  . escitalopram (LEXAPRO) 20 MG tablet Take 1 tablet (20 mg total) by mouth 2 (two) times daily. for mood control 180 tablet 2  . furosemide (LASIX) 20 MG tablet Take 1 tablet (20 mg total) by mouth daily. 90 tablet 3  . glipiZIDE (GLUCOTROL) 10 MG tablet Take 1 tablet (10 mg total) by mouth 2 (two) times daily before a meal. 60 tablet 0  . hydrOXYzine (ATARAX/VISTARIL) 50 MG tablet Take 1 tablet (50 mg total) by mouth every 6 (six) hours as needed for anxiety. 90 tablet 2  . insulin NPH-regular Human (70-30) 100 UNIT/ML injection Sliding scale    . levothyroxine (SYNTHROID, LEVOTHROID) 50 MCG tablet Take 1 tablet (50 mcg total) by mouth daily before breakfast. For hypothyroidism 30  tablet 0  . metoprolol succinate (TOPROL XL) 25 MG 24 hr tablet Take 1 tablet (25 mg total) by mouth daily. 90 tablet 3  . nitroGLYCERIN (NITROSTAT) 0.4 MG SL tablet Place under the tongue.    . pantoprazole (PROTONIX) 40 MG tablet Take 1 tablet (40 mg total) by mouth daily. 30 tablet 1  . Potassium Chloride ER 20 MEQ TBCR Take 20 mEq by mouth daily. 90 tablet 3  . risperiDONE (RISPERDAL) 1 MG tablet Take one in the am and two at bedtime 270 tablet 2  . rizatriptan (MAXALT) 5 MG tablet Take by mouth.    . traZODone (DESYREL) 100 MG tablet Take 1 tablet (100 mg total) by mouth at bedtime. 90 tablet 2   No current facility-administered medications for this visit.      Musculoskeletal: Strength & Muscle Tone: within normal limits Gait & Station: normal Patient leans: N/A  Psychiatric Specialty Exam: Review of Systems  Constitutional: Positive for malaise/fatigue.  Musculoskeletal: Positive for back pain.  Psychiatric/Behavioral: The patient is nervous/anxious.   All other systems reviewed and are negative.   Blood pressure 126/85, pulse 84, height  (1.626 m), weight (!) 319 lb (144.7 kg), SpO2 96 %.Body  mass index is 54.76 kg/m.  General Appearance: Casual and Fairly Groomed  Eye Contact:  Good  Speech:  Clear and Coherent  Volume:  Normal  Mood:  Anxious  Affect:  Constricted and Flat  Thought Process:  Goal Directed  Orientation:  Full (Time, Place, and Person)  Thought Content: Rumination   Suicidal Thoughts:  No  Homicidal Thoughts:  No  Memory:  Immediate;   Good Recent;   Good Remote;   Fair  Judgement:  Fair  Insight:  Fair  Psychomotor Activity:  Decreased  Concentration:  Concentration: Good and Attention Span: Good  Recall:  Good  Fund of Knowledge: Fair  Language: Good  Akathisia:  No  Handed:  Right  AIMS (if indicated): not done  Assets:  Communication Skills Desire for Improvement Resilience Social Support Talents/Skills  ADL's:  Intact   Cognition: WNL  Sleep:  Poor   Screenings: AIMS     [facility]  AIMS Total Score  0    AUDIT     Admission (Discharged[facility]  Alcohol Use Disorder Identification Test Final Score (AUDIT)  0    MDI     [facility]  Total Score (max 50)  25    PHQ2-9     Counselor from 06/21/2016 [facility]  PHQ-2 Total Score  4  PHQ-9 Total Score  15       Assessment and Plan: Patient is a 58 year old female with a history of bipolar disorder.  She states that she is anxious so I encouraged her to take the hydroxyzine 50 mg 3 times a day.  She is also already on BuSpar 30 mg 3 times daily.  She will continue Lexapro 20 mg twice daily for depression and Risperdal 1 mg in the morning and 2 at bedtime for mood stabilization hallucinations.  She will return to see me in 2 months or call sooner if needed   [name], MD 10/06/2018, 11:21 AM

## 2018-10-16 ENCOUNTER — Encounter (HOSPITAL_COMMUNITY)
Admission: RE | Admit: 2018-10-16 | Discharge: 2018-10-16 | Disposition: A | Discharge status: Home / Self Care | Payer: Medicare HMO | Source: Ambulatory Visit | Attending: Cardiovascular Disease | Admitting: Cardiovascular Disease

## 2018-10-16 DIAGNOSIS — Z794 Long term (current) use of insulin: Secondary | ICD-10-CM | POA: Diagnosis not present

## 2018-10-16 DIAGNOSIS — Z951 Presence of aortocoronary bypass graft: Secondary | ICD-10-CM | POA: Diagnosis present

## 2018-10-16 DIAGNOSIS — Z7902 Long term (current) use of antithrombotics/antiplatelets: Secondary | ICD-10-CM | POA: Insufficient documentation

## 2018-10-16 DIAGNOSIS — Z79899 Other long term (current) drug therapy: Secondary | ICD-10-CM | POA: Insufficient documentation

## 2018-10-16 LAB — GLUCOSE, CAPILLARY: GLUCOSE-CAPILLARY: 392 mg/dL — AB (ref 70–99)

## 2018-11-20 ENCOUNTER — Ambulatory Visit: Payer: Self-pay | Admitting: "Endocrinology

## 2018-12-08 ENCOUNTER — Ambulatory Visit: Payer: Self-pay | Admitting: Cardiovascular Disease

## 2018-12-22 ENCOUNTER — Ambulatory Visit: Payer: Self-pay | Admitting: "Endocrinology

## 2018-12-25 ENCOUNTER — Other Ambulatory Visit (HOSPITAL_COMMUNITY): Payer: Self-pay | Admitting: Psychiatry

## 2018-12-25 ENCOUNTER — Telehealth (HOSPITAL_COMMUNITY): Payer: Self-pay | Admitting: *Deleted

## 2018-12-25 MED ORDER — HYDROXYZINE HCL 50 MG PO TABS: 50.0000 mg | ORAL_TABLET | Freq: Four times a day (QID) | ORAL | 2 refills | Status: DC | PRN

## 2018-12-25 MED ORDER — ESCITALOPRAM OXALATE 20 MG PO TABS: 20.0000 mg | ORAL_TABLET | Freq: Two times a day (BID) | ORAL | 2 refills | Status: DC

## 2018-12-25 MED ORDER — BUSPIRONE HCL 30 MG PO TABS: 30.0000 mg | ORAL_TABLET | Freq: Three times a day (TID) | ORAL | 2 refills | Status: DC

## 2018-12-25 MED ORDER — TRAZODONE HCL 100 MG PO TABS: 100.0000 mg | ORAL_TABLET | Freq: Every day | ORAL | 2 refills | Status: DC

## 2018-12-25 MED ORDER — RISPERIDONE 1 MG PO TABS: ORAL_TABLET | ORAL | 2 refills | Status: DC

## 2018-12-25 NOTE — Telephone Encounter (Signed)
Dr Tenny Craw Patient called [facility]VM requesting refills on psych med's especially the anxiety medication

## 2018-12-25 NOTE — Telephone Encounter (Signed)
Sent to [employer] 

## 2018-12-27 ENCOUNTER — Telehealth (HOSPITAL_COMMUNITY): Payer: Self-pay | Admitting: *Deleted

## 2018-12-27 NOTE — Telephone Encounter (Signed)
[employer] Approved Prescription Drug Coverage   Escitalopram 20 mg Tab  Authorization Good Thru 09/27/2019

## 2019-01-05 ENCOUNTER — Ambulatory Visit (INDEPENDENT_AMBULATORY_CARE_PROVIDER_SITE_OTHER): Payer: Medicare HMO | Admitting: Psychiatry

## 2019-01-05 ENCOUNTER — Encounter (HOSPITAL_COMMUNITY): Payer: Self-pay | Admitting: Psychiatry

## 2019-01-05 ENCOUNTER — Other Ambulatory Visit: Payer: Self-pay

## 2019-01-05 DIAGNOSIS — F3130 Bipolar disorder, current episode depressed, mild or moderate severity, unspecified: Secondary | ICD-10-CM | POA: Diagnosis not present

## 2019-01-05 MED ORDER — BUSPIRONE HCL 30 MG PO TABS: 30.0000 mg | ORAL_TABLET | Freq: Three times a day (TID) | ORAL | 2 refills | Status: DC

## 2019-01-05 MED ORDER — RISPERIDONE 1 MG PO TABS: ORAL_TABLET | ORAL | 2 refills | Status: DC

## 2019-01-05 MED ORDER — TRAZODONE HCL 100 MG PO TABS: 100.0000 mg | ORAL_TABLET | Freq: Every day | ORAL | 2 refills | Status: DC

## 2019-01-05 MED ORDER — HYDROXYZINE HCL 50 MG PO TABS: 50.0000 mg | ORAL_TABLET | Freq: Four times a day (QID) | ORAL | 2 refills | Status: DC | PRN

## 2019-01-05 MED ORDER — ESCITALOPRAM OXALATE 20 MG PO TABS: 20.0000 mg | ORAL_TABLET | Freq: Two times a day (BID) | ORAL | 2 refills | Status: DC

## 2019-01-05 NOTE — Progress Notes (Signed)
Virtual Visit via Telephone Note  I connected with [name] on 01/05/19 at 10:00 AM EDT by telephone and verified that I am speaking with the correct person using two identifiers.   I discussed the limitations, risks, security and privacy concerns of performing an evaluation and management service by telephone and the availability of in person appointments. I also discussed with the patient that there may be a patient responsible charge related to this service. The patient expressed understanding and agreed to proceed.      I discussed the assessment and treatment plan with the patient. The patient was provided an opportunity to ask questions and all were answered. The patient agreed with the plan and demonstrated an understanding of the instructions.   The patient was advised to call back or seek an in-person evaluation if the symptoms worsen or if the condition fails to improve as anticipated.  I provided 15 minutes of non-face-to-face time during this encounter.   Diannia Rudereborah Ross, MD  [facility] MD/PA/NP OP Progress Note  01/05/2019 10:12 AM [name]  MRN:  [id number]  Chief Complaint:  Chief Complaint    Depression; Anxiety; Manic Behavior; Follow-up     HPI: This patient is a [age] year oldwidowed white female who lives alonein [place]. She has 2 grown daughters and one 59 year old grandson. She is on disability for both back pain and bipolar disorder.  The patient was referred by Dr. Lolly MustacheArfeen from our [facility]. The patient would like to start coming here as it is too far for her to drive to the other clinic.  The patient states that she has had problems with mood since her mid 3740s. She began losing her temper having severe outburst mood swings and lashing out at people. 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 She began being paranoid and accusing people of doing things behind her back or talking about  her. She was hospitalized 4 times the last time being at the behavioral health hospital in 2011. At that time she was very depressed hallucinating and hearing command hallucinations to cut herself or kill herself. She was Stabilized on a combination of lithium Wellbutrin and Risperdal.She had seen Dr. Lolly MustacheArfeen for a while and had been transferred to me after it became too far to drive to the [facility].  The patient is assessed via telephone interview because of the coronavirus pandemic.  She was last seen 3 months ago.  She states that she is doing okay with a stay at home orders.  Her daughter goes out and buys groceries for her.  Her children are not coming into the home to prevent her from getting exposures as they are both still working.  She does talk with him every day.  She states that she has been having a lot of congestion lately which she attributes to allergies and has not been feeling all that well.  She is lying down a lot.  Most the time she is sleeping okay.  She states that she has more "mood swings."  She feels great 1 minute and down the next.  I explained that the risperdal is supposed to help with this and she sounds perfectly fine on the phone but we can go ahead and increase it to 2 mg twice daily but she needs to keep a close watch on her blood sugar  She denies any thoughts of self-harm or suicide or severe depression.  She is somewhat  anxious but the medication is helping to some degree.  She does actually sound quite upbeat on the phone and appropriate.  She denies auditory or visual hallucinations but sometimes thinks that she hears music coming from next door.  She denies any paranoid ideation or other psychotic symptoms. Visit Diagnosis:    ICD-10-CM   1. Bipolar I disorder, most recent episode depressed (HCC) F31.30     Past Psychiatric History: 4 previous hospitalizations for bipolar disorder, long-term outpatient treatment  Past Medical History:  Past Medical  History:  Diagnosis Date  . Asthma   . Back pain   . CAD (coronary artery disease)    a. 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   . Chest pain   . Diabetes mellitus type II   . Headache   . High cholesterol   . Hypertension   . Obesity, morbid (HCC)   . Other and unspecified bipolar disorders   . 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 Thyroid disease     Past Surgical History:  Procedure Laterality Date  . TUBAL LIGATION     Bilateral    Family Psychiatric History: See below  Family History:  Family History  Problem Relation Age of Onset  . Heart attack Maternal Uncle   . Depression Maternal Uncle   . Depression Mother   . Alcohol abuse Maternal Grandfather   . Alcohol abuse Maternal Uncle   . Depression Maternal Grandmother     Social History:  Social History   Socioeconomic History  . Marital status: Widowed    Spouse name: Not on file  . Number of children: Not on file  . Years of education: Not on file  . Highest education level: Not on file  Occupational History  . Occupation: Disabled    Associate Professor: UNEMPLOYED  Social Needs  . Financial resource strain: Not on file  . Food insecurity:    Worry: Not on file    Inability: Not on file  . Transportation needs:    Medical: Not on file    Non-medical: Not on file  Tobacco Use  . Smoking status: Never Smoker  . Smokeless tobacco: Never Used  Substance and Sexual Activity  . Alcohol use: No    Alcohol/week: 0.0 standard drinks  . Drug use: No    Comment: 05/13/2016 per pt no  . Sexual activity: Yes    Birth control/protection: Surgical  Lifestyle  . Physical activity:    Days per week: Not on file    Minutes per session: Not on file  . Stress: Not on file  Relationships  . Social connections:    Talks on phone: Not on file    Gets together: Not on file    Attends  religious service: Not on file    Active member of club or organization: Not on file    Attends meetings of clubs or organizations: Not on file    Relationship status: Not on file  Other Topics Concern  . Not on file  Social History Narrative   Married   No regular exercise    Allergies:  Allergies  Allergen Reactions  . Amoxicillin Swelling and Other (See Comments)    Oral swelling   . Doxycycline Nausea And Vomiting  . Levofloxacin Other (See Comments)    headache  . Propoxyphene N-Acetaminophen Nausea And Vomiting    Metabolic Disorder Labs:  Lab Results  Component Value Date   HGBA1C 10.7 (H) 07/17/2018   MPG 260 07/17/2018   MPG 269 (H) 10/14/2014   No results found for: PROLACTIN Lab Results  Component Value Date   CHOL (H) 06/28/2009    388        ATP III CLASSIFICATION:  <200     mg/dL   Desirable  161-096200-239  mg/dL   Borderline High  >=045>=240    mg/dL   High          TRIG 409789 (H) 06/28/2009   HDL 42 06/28/2009   CHOLHDL 9.2 06/28/2009   VLDL UNABLE TO CALCULATE IF TRIGLYCERIDE OVER 400 mg/dL 81/19/147810/10/2008   LDLCALC  29/56/213010/10/2008    UNABLE TO CALCULATE IF TRIGLYCERIDE OVER 400 mg/dL        Total Cholesterol/HDL:CHD Risk Coronary Heart Disease Risk Table                     Men   Women  1/2 Average Risk   3.4   3.3  Average Risk       5.0   4.4  2 X Average Risk   9.6   7.1  3 X Average Risk  23.4   11.0        Use the calculated Patient Ratio above and the CHD Risk Table to determine the patient's CHD Risk.        ATP III CLASSIFICATION (LDL):  <100     mg/dL   Optimal  865-784100-129  mg/dL   Near or Above                    Optimal  130-159  mg/dL   Borderline  696-295160-189  mg/dL   High  >284>190     mg/dL   Very High   Lab Results  Component Value Date   TSH 4.88 (H) 02/03/2016   TSH 4.395 10/14/2014    Therapeutic Level Labs: Lab Results  Component Value Date   LITHIUM 0.6 (L) 07/07/2016   LITHIUM 0.6 (L) 03/10/2016   No results found for: VALPROATE No  components found for:  CBMZ  Current Medications: Current Outpatient Medications  Medication Sig Dispense Refill  . albuterol (PROVENTIL HFA;VENTOLIN HFA) 108 (90 Base) MCG/ACT inhaler Inhale 2 puffs into the lungs every 6 (six) hours as needed for wheezing or shortness of breath. 1 Inhaler 0  . amLODipine (NORVASC) 10 MG tablet Take 1 tablet (10 mg total) by mouth daily. For high blood pressure 30 tablet 0  . atorvastatin (LIPITOR) 80 MG tablet Take 1 tablet (80 mg total) by mouth every morning. (Patient taking differently: Take 80 mg by mouth daily at 6 PM. ) 30 tablet 0  . busPIRone (BUSPAR) 30 MG tablet Take 1 tablet (30 mg total) by mouth 3 (three) times daily. 270 tablet 2  . clopidogrel (PLAVIX) 75 MG tablet Take 1 tablet (75 mg total) by mouth daily. 30 tablet 0  . escitalopram (LEXAPRO) 20 MG tablet Take 1 tablet (20 mg total) by mouth 2 (two) times daily. for mood control 180 tablet 2  . furosemide (LASIX) 20 MG tablet Take 1 tablet (20 mg total) by mouth daily. 90 tablet 3  . glipiZIDE (GLUCOTROL) 10 MG tablet Take 1 tablet (10 mg total) by mouth 2 (two) times daily before a meal. 60 tablet 0  . hydrOXYzine (ATARAX/VISTARIL) 50 MG tablet Take 1 tablet (50 mg total) by mouth every 6 (six) hours  as needed for anxiety. 90 tablet 2  . insulin NPH-regular Human (70-30) 100 UNIT/ML injection Sliding scale    . levothyroxine (SYNTHROID, LEVOTHROID) 50 MCG tablet Take 1 tablet (50 mcg total) by mouth daily before breakfast. For hypothyroidism 30 tablet 0  . metoprolol succinate (TOPROL XL) 25 MG 24 hr tablet Take 1 tablet (25 mg total) by mouth daily. 90 tablet 3  . nitroGLYCERIN (NITROSTAT) 0.4 MG SL tablet Place under the tongue.    . pantoprazole (PROTONIX) 40 MG tablet Take 1 tablet (40 mg total) by mouth daily. 30 tablet 1  . Potassium Chloride ER 20 MEQ TBCR Take 20 mEq by mouth daily. 90 tablet 3  . risperiDONE (RISPERDAL) 1 MG tablet Take 2 tablets twice a day 360 tablet 2  .  rizatriptan (MAXALT) 5 MG tablet Take by mouth.    . traZODone (DESYREL) 100 MG tablet Take 1 tablet (100 mg total) by mouth at bedtime. 90 tablet 2   No current facility-administered medications for this visit.      Musculoskeletal: Strength & Muscle Tone: Not assessed, phone visit Gait & Station:  Patient leans:   Psychiatric Specialty Exam: Review of Systems  HENT: Positive for congestion.   Musculoskeletal: Positive for back pain and joint pain.  Psychiatric/Behavioral: The patient is nervous/anxious.   All other systems reviewed and are negative.   There were no vitals taken for this visit.There is no height or weight on file to calculate BMI.  General Appearance: NA  Eye Contact:  NA  Speech:  Clear and Coherent  Volume:  Normal  Mood:  Anxious  Affect:  NA  Thought Process:  Goal Directed  Orientation:  Full (Time, Place, and Person)  Thought Content: Rumination   Suicidal Thoughts:  No  Homicidal Thoughts:  No  Memory:  Immediate;   Good Recent;   Fair Remote;   Poor  Judgement:  Fair  Insight:  Shallow  Psychomotor Activity:  Decreased  Concentration:  Concentration: Fair and Attention Span: Fair  Recall:  [employer] of Knowledge: Fair  Language: Good  Akathisia:  No  Handed:  Right  AIMS (if indicated): not done  Assets:  Communication Skills Desire for Improvement Resilience Social Support Talents/Skills  ADL's:  Intact  Cognition: WNL  Sleep:  Fair   Screenings: AIMS     [facility]  AIMS Total Score  0    AUDIT     Ad[facility] (Dis[facility]  Alcohol Use Disorder Identification Test Final Score (AUDIT)  0    MDI     Office Visit from 04/13/2016 in [facility]  Total Score (max 50)  25    PHQ2-9     Counselor from [facility]/21/2016 [facility]  PHQ-2 Total Score  4  PHQ-9 Total Score  15       Assessment and Plan: This patient is a 58 year old female with a history of significant heart disease type 2 diabetes and bipolar disorder.  I will increase her Risperdal to 2 mg twice daily but she will need to keep a close eye on her blood sugars.  She claims they are running in the 200s now compared to 400s in the past.  She will continue trazodone 100 mg at bedtime for sleep, BuSpar 30 mg 3 times daily for anxiety, hydroxyzine 50 mg every 6 hours as needed  for anxiety, Lexapro 20 mg twice daily for depression.  She will return to see me in 4 weeks or call sooner if needed   [name], MD 01/05/2019, 10:12 AM

## 2019-01-09 ENCOUNTER — Other Ambulatory Visit: Payer: Self-pay

## 2019-01-09 ENCOUNTER — Ambulatory Visit: Payer: Medicare HMO | Admitting: "Endocrinology

## 2019-01-09 ENCOUNTER — Other Ambulatory Visit (HOSPITAL_COMMUNITY): Payer: Self-pay | Admitting: Psychiatry

## 2019-01-09 MED ORDER — RISPERIDONE 2 MG PO TABS: 2.0000 mg | ORAL_TABLET | Freq: Two times a day (BID) | ORAL | 2 refills | Status: DC

## 2019-01-12 ENCOUNTER — Telehealth (HOSPITAL_COMMUNITY): Payer: Self-pay | Admitting: *Deleted

## 2019-01-12 NOTE — Telephone Encounter (Signed)
[employer] APPROVED RISPERIDONE 1 MG TABLET  360.00/90  AUTHORIZATION GOOD UNTIL 09/27/2019

## 2019-01-30 ENCOUNTER — Ambulatory Visit (INDEPENDENT_AMBULATORY_CARE_PROVIDER_SITE_OTHER): Payer: Medicare HMO | Admitting: "Endocrinology

## 2019-01-30 ENCOUNTER — Other Ambulatory Visit: Payer: Self-pay

## 2019-01-30 ENCOUNTER — Encounter: Payer: Self-pay | Admitting: "Endocrinology

## 2019-01-30 VITALS — BP 112/72 | HR 76 | Ht 64.0 in | Wt 319.0 lb

## 2019-01-30 DIAGNOSIS — I10 Essential (primary) hypertension: Secondary | ICD-10-CM | POA: Diagnosis not present

## 2019-01-30 DIAGNOSIS — E039 Hypothyroidism, unspecified: Secondary | ICD-10-CM

## 2019-01-30 DIAGNOSIS — E782 Mixed hyperlipidemia: Secondary | ICD-10-CM

## 2019-01-30 DIAGNOSIS — E1159 Type 2 diabetes mellitus with other circulatory complications: Secondary | ICD-10-CM | POA: Diagnosis not present

## 2019-01-30 DIAGNOSIS — E785 Hyperlipidemia, unspecified: Secondary | ICD-10-CM | POA: Insufficient documentation

## 2019-01-30 MED ORDER — METFORMIN HCL ER 500 MG PO TB24: 500.0000 mg | ORAL_TABLET | Freq: Every day | ORAL | 3 refills | Status: DC

## 2019-01-30 NOTE — Patient Instructions (Signed)

## 2019-01-30 NOTE — Progress Notes (Signed)
Endocrinology Consult Note       01/30/2019, 10:50 AM   Subjective:    Patient ID: [name], female    DOB: [birth year].  [name] is being seen in consultation for management of currently uncontrolled symptomatic diabetes requested by  Barbie Banner, MD.   Past Medical History:  Diagnosis Date  . Asthma   . Back pain   . CAD (coronary artery disease)    a. 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   . Chest pain   . Diabetes mellitus type II   . Headache   . High cholesterol   . Hypertension   . Obesity, morbid (HCC)   . Other and unspecified bipolar disorders   . 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 Thyroid disease     Past Surgical History:  Procedure Laterality Date  . TUBAL LIGATION     Bilateral    Social History   Socioeconomic History  . Marital status: Widowed    Spouse name: Not on file  . Number of children: Not on file  . Years of education: Not on file  . Highest education level: Not on file  Occupational History  . Occupation: Disabled    Associate Professor: UNEMPLOYED  Social Needs  . Financial resource strain: Not on file  . Food insecurity:    Worry: Not on file    Inability: Not on file  . Transportation needs:    Medical: Not on file    Non-medical: Not on file  Tobacco Use  . Smoking status: Never Smoker  . Smokeless tobacco: Never Used  Substance and Sexual Activity  . Alcohol use: No    Alcohol/week: 0.0 standard drinks  . Drug use: No    Comment: 05/13/2016 per pt no  . Sexual activity: Yes    Birth control/protection: Surgical  Lifestyle  . Physical activity:    Days per week: Not on file    Minutes per session: Not on file  . Stress: Not on file  Relationships  . Social connections:    Talks on phone: Not on file     Gets together: Not on file    Attends religious service: Not on file    Active member of club or organization: Not on file    Attends meetings of clubs or organizations: Not on file    Relationship status: Not on file  Other Topics Concern  . Not on file  Social History Narrative   Married   No regular exercise    Family History  Problem Relation Age of Onset  . Heart attack Maternal Uncle   . Depression Maternal Uncle   . Depression Mother   . Alcohol abuse Maternal Grandfather   . Alcohol abuse Maternal Uncle   . Depression Maternal Grandmother     Outpatient Encounter Medications as of 01/30/2019  Medication Sig  . albuterol (PROVENTIL HFA;VENTOLIN HFA) 108 (90 Base) MCG/ACT inhaler Inhale 2 puffs into the lungs every 6 (six) hours as needed for wheezing  or shortness of breath.  Marland Kitchen. amLODipine (NORVASC) 10 MG tablet Take 1 tablet (10 mg total) by mouth daily. For high blood pressure  . atorvastatin (LIPITOR) 80 MG tablet Take 1 tablet (80 mg total) by mouth every morning. (Patient taking differently: Take 80 mg by mouth daily at 6 PM. )  . busPIRone (BUSPAR) 30 MG tablet Take 1 tablet (30 mg total) by mouth 3 (three) times daily.  . clopidogrel (PLAVIX) 75 MG tablet Take 1 tablet (75 mg total) by mouth daily.  Marland Kitchen. escitalopram (LEXAPRO) 20 MG tablet Take 1 tablet (20 mg total) by mouth 2 (two) times daily. for mood control  . furosemide (LASIX) 20 MG tablet Take 1 tablet (20 mg total) by mouth daily.  Marland Kitchen. glipiZIDE (GLUCOTROL) 10 MG tablet Take 1 tablet (10 mg total) by mouth 2 (two) times daily before a meal.  . hydrOXYzine (ATARAX/VISTARIL) 50 MG tablet Take 1 tablet (50 mg total) by mouth every 6 (six) hours as needed for anxiety.  Marland Kitchen. LEVEMIR 100 UNIT/ML injection Inject 80 Units into the skin at bedtime.  Marland Kitchen. levothyroxine (SYNTHROID, LEVOTHROID) 50 MCG tablet Take 1 tablet (50 mcg total) by mouth daily before breakfast. For hypothyroidism  . metFORMIN (GLUCOPHAGE XR) 500 MG 24 hr  tablet Take 1 tablet (500 mg total) by mouth daily with breakfast.  . metoprolol succinate (TOPROL XL) 25 MG 24 hr tablet Take 1 tablet (25 mg total) by mouth daily.  . nitroGLYCERIN (NITROSTAT) 0.4 MG SL tablet Place under the tongue.  . pantoprazole (PROTONIX) 40 MG tablet Take 1 tablet (40 mg total) by mouth daily.  . Potassium Chloride ER 20 MEQ TBCR Take 20 mEq by mouth daily.  . risperiDONE (RISPERDAL) 2 MG tablet Take 1 tablet (2 mg total) by mouth 2 (two) times daily.  . rizatriptan (MAXALT) 5 MG tablet Take by mouth.  . traZODone (DESYREL) 100 MG tablet Take 1 tablet (100 mg total) by mouth at bedtime.  . [DISCONTINUED] insulin NPH-regular Human (70-30) 100 UNIT/ML injection Sliding scale   No facility-administered encounter medications on file as of 01/30/2019.     ALLERGIES: Allergies  Allergen Reactions  . Amoxicillin Swelling and Other (See Comments)    Oral swelling   . Doxycycline Nausea And Vomiting  . Levofloxacin Other (See Comments)    headache  . Propoxyphene N-Acetaminophen Nausea And Vomiting    VACCINATION STATUS: Immunization History  Administered Date(s) Administered  . Influenza,inj,Quad PF,6+ Mos 07/18/2018  . Pneumococcal Polysaccharide-23 07/18/2018    Diabetes  She presents for her initial diabetic visit. She has type 2 diabetes mellitus. Onset time: She was diagnosed at approximate age of 58 years. Her disease course has been worsening. There are no hypoglycemic associated symptoms. Pertinent negatives for hypoglycemia include no confusion, headaches, pallor, seizures or tremors. Associated symptoms include fatigue, polydipsia and polyuria. Pertinent negatives for diabetes include no chest pain and no polyphagia. There are no hypoglycemic complications. Diabetic complications include heart disease. (Status post triple coronary bypass in November 2019.) Risk factors for coronary artery disease include diabetes mellitus, dyslipidemia, family history,  hypertension, obesity, sedentary lifestyle and tobacco exposure. Current diabetic treatment includes insulin injections and oral agent (monotherapy). Her weight is increasing steadily. She is following a generally unhealthy diet. When asked about meal planning, she reported none. She has not had a previous visit with a dietitian. She rarely participates in exercise. Her home blood glucose trend is fluctuating minimally. Her overall blood glucose range is >200 mg/dl. (She did  not bring any meter nor logs to review.  Her recent labs show A1c of 10% in December 2019, 10.7% in October 2019.) An ACE inhibitor/angiotensin II receptor blocker is being taken. Eye exam is current.  Hypertension  This is a chronic problem. The current episode started more than 1 year ago. The problem is controlled. Pertinent negatives include no chest pain, headaches, palpitations or shortness of breath. Risk factors for coronary artery disease include dyslipidemia, diabetes mellitus, obesity, sedentary lifestyle, smoking/tobacco exposure and family history. Past treatments include calcium channel blockers. Hypertensive end-organ damage includes CAD/MI.  Hyperlipidemia  This is a chronic problem. The current episode started more than 1 year ago. Exacerbating diseases include diabetes, hypothyroidism and obesity. Pertinent negatives include no chest pain, myalgias or shortness of breath. Risk factors for coronary artery disease include diabetes mellitus, dyslipidemia, family history, obesity, hypertension, a sedentary lifestyle and post-menopausal.     Review of Systems  Constitutional: Positive for fatigue. Negative for chills, fever and unexpected weight change.  HENT: Negative for trouble swallowing and voice change.   Eyes: Negative for visual disturbance.  Respiratory: Negative for cough, shortness of breath and wheezing.   Cardiovascular: Negative for chest pain, palpitations and leg swelling.       No Shortness of breath   Gastrointestinal: Negative for abdominal pain, diarrhea, nausea and vomiting.  Endocrine: Positive for polydipsia and polyuria. Negative for cold intolerance, heat intolerance and polyphagia.  Genitourinary: Negative for frequency, hematuria and urgency.  Musculoskeletal: Negative for arthralgias and myalgias.  Skin: Negative for color change, pallor, rash and wound.  Neurological: Negative for tremors, seizures and headaches.  Hematological: Does not bruise/bleed easily.  Psychiatric/Behavioral: Negative for confusion, hallucinations and suicidal ideas.    Objective:    BP 112/72   Pulse 76   Ht 5\' 4"  (1.626 m)   Wt (!) 319 lb (144.7 kg)   BMI 54.76 kg/m   Wt Readings from Last 3 Encounters:  01/30/19 (!) 319 lb (144.7 kg)  09/13/18 (!) 320 lb 12.8 oz (145.5 kg)  07/24/18 (!) 327 lb (148.3 kg)     Physical Exam Constitutional:      Appearance: She is well-developed.  HENT:     Head: Normocephalic and atraumatic.  Neck:     Musculoskeletal: Normal range of motion and neck supple.     Thyroid: No thyromegaly.     Trachea: No tracheal deviation.  Cardiovascular:     Rate and Rhythm: Normal rate and regular rhythm.  Pulmonary:     Effort: Pulmonary effort is normal.  Abdominal:     Tenderness: There is no abdominal tenderness. There is no guarding.  Musculoskeletal: Normal range of motion.  Skin:    General: Skin is warm and dry.     Coloration: Skin is not pale.     Findings: No erythema or rash.  Neurological:     Mental Status: She is alert and oriented to person, place, and time.     Cranial Nerves: No cranial nerve deficit.     Coordination: Coordination normal.     Deep Tendon Reflexes: Reflexes are normal and symmetric.  Psychiatric:        Mood and Affect: Mood normal.        Judgment: Judgment normal.     CMP ( most recent) CMP     Component Value Date/Time   NA 133 (L) 09/13/2018 1625   K 4.0 09/13/2018 1625   CL 97 (L) 09/13/2018 1625   CO2 27  09/13/2018 1625   GLUCOSE 412 (H) 09/13/2018 1625   BUN 28 (H) 09/13/2018 1625   CREATININE 1.10 (H) 09/13/2018 1625   CREATININE 0.87 10/14/2014 1631   CALCIUM 9.3 09/13/2018 1625   PROT 6.8 07/17/2018 0514   ALBUMIN 3.0 (L) 07/17/2018 0514   AST 22 07/17/2018 0514   ALT 38 07/17/2018 0514   ALKPHOS 75 07/17/2018 0514   BILITOT 0.6 07/17/2018 0514   GFRNONAA 55 (L) 09/13/2018 1625   GFRNONAA 76 10/14/2014 1631   GFRAA >60 09/13/2018 1625   GFRAA 87 10/14/2014 1631     Diabetic Labs (most recent): Lab Results  Component Value Date   HGBA1C 10.7 (H) 07/17/2018   HGBA1C 11.0 (H) 10/14/2014   HGBA1C (H) 09/05/2010    8.8 (NOTE)                                                                       According to the ADA Clinical Practice Recommendations for 2011, when HbA1c is used as a screening test:   >=6.5%   Diagnostic of Diabetes Mellitus           (if abnormal result  is confirmed)  5.7-6.4%   Increased risk of developing Diabetes Mellitus  References:Diagnosis and Classification of Diabetes Mellitus,Diabetes Care,2011,34(Suppl 1):S62-S69 and Standards of Medical Care in         Diabetes - 2011,Diabetes Care,2011,34  (Suppl 1):S11-S61.     Lipid Panel ( most recent) Lipid Panel     Component Value Date/Time   CHOL (H) 06/28/2009 0611    388        ATP III CLASSIFICATION:  <200     mg/dL   Desirable  250-037  mg/dL   Borderline High  >=048    mg/dL   High          TRIG 889 (H) 06/28/2009 0611   HDL 42 06/28/2009 0611   CHOLHDL 9.2 06/28/2009 0611   VLDL UNABLE TO CALCULATE IF TRIGLYCERIDE OVER 400 mg/dL 16/94/5038 8828   LDLCALC  06/28/2009 0611    UNABLE TO CALCULATE IF TRIGLYCERIDE OVER 400 mg/dL        Total Cholesterol/HDL:CHD Risk Coronary Heart Disease Risk Table                     Men   Women  1/2 Average Risk   3.4   3.3  Average Risk       5.0   4.4  2 X Average Risk   9.6   7.1  3 X Average Risk  23.4   11.0        Use the calculated Patient  Ratio above and the CHD Risk Table to determine the patient's CHD Risk.        ATP III CLASSIFICATION (LDL):  <100     mg/dL   Optimal  003-491  mg/dL   Near or Above                    Optimal  130-159  mg/dL   Borderline  791-505  mg/dL   High  >697     mg/dL   Very High       Results for [facility], Keimya G (  MRN [id number]) as of 01/30/2019 12:18  Ref. Range 10/14/2014 16:31 02/03/2016 13:51  TSH Latest Units: mIU/L 4.395 4.88 (H)  Thyroxine (T4) Latest Ref Range: 4.5 - 12.0 ug/dL  6.8  Free Thyroxine Index Latest Ref Range: 1.4 - 3.8   2.2  T3 Uptake Latest Ref Range: 22 - 35 %  32    Assessment & Plan:   1. DM type 2 causing vascular disease (HCC)  - SHANIQUIA BRAFFORD has currently uncontrolled symptomatic type 2 DM since 58 years of age,  with most recent A1c of 10 %. Recent labs reviewed. - I had a long discussion with her about the progressive nature of diabetes and the pathology behind its complications. -her diabetes is complicated by coronary artery disease status post triple bypass in November 2019, morbid obesity, history of smoking, sedentary life and she remains at a high risk for more acute and chronic complications which include CAD, CVA, CKD, retinopathy, and neuropathy. These are all discussed in detail with her.  - I have counseled her on diet management and weight loss, by adopting a carbohydrate restricted/protein rich diet. - she admits that there is a room for improvement in her food and drink choices. - Suggestion is made for her to avoid simple carbohydrates  from her diet including Cakes, Sweet Desserts, Ice Cream, Soda (diet and regular), Sweet Tea, Candies, Chips, Cookies, Store Bought Juices, Alcohol in Excess of  1-2 drinks a day, Artificial Sweeteners,  Coffee Creamer, and "Sugar-free" Products. This will help patient to have more stable blood glucose profile and potentially avoid unintended weight gain.  - I encouraged her to switch to  unprocessed or  minimally processed complex starch and increased protein intake (animal or plant source), fruits, and vegetables.  - she is advised to stick to a routine mealtimes to eat 3 meals  a day and avoid unnecessary snacks ( to snack only to correct hypoglycemia).   - she will be scheduled with Norm Salt, RDN, CDE for individualized diabetes education.  - I have approached her with the following individualized plan to manage diabetes and patient agrees:   -Based on her current glycemic burden as well as her comorbidities, she will likely require intensive treatment with basal/bolus insulin in order for her to achieve control of diabetes to target.  -In preparation, she is advised to start monitoring blood glucose 4 times a day -before meals and at bedtime. -In the meantime, she is advised to increase her Levemir to 80 units nightly. - she is warned not to take insulin without proper monitoring per orders.  - she is encouraged to call clinic for blood glucose levels less than 70 or above 300 mg /dl. - she is advised to continue glipizide 10 mg p.o. twice daily with breakfast and supper, -She reports history of metformin intolerance at high doses, however is willing to retry at a lower dose.  I discussed and added metformin 500 mg XR daily after breakfast.  - she will be considered for incretin therapy as appropriate next visit.  - Patient specific target  A1c;  LDL, HDL, Triglycerides, and  Waist Circumference were discussed in detail.  2) Blood Pressure /Hypertension:  her blood pressure is  controlled to target.   she is advised to continue her current medications including metoprolol 25 mg p.o. daily, amlodipine  p.o. daily with breakfast .  She also has Lasix as needed.  She will be considered for low-dose ACE inhibitors on subsequent visits. 3) Lipids/Hyperlipidemia:  Review of her recent lipid panel showed uncontrolled triglyceridemia greater than 700.  She is advised to continue  Lipitor 80 mg qhs.  -  Side effects and precautions discussed with her.  4)  Weight/Diet:  Body mass index is 54.76 kg/m.  -   clearly complicating her diabetes care.  I discussed with her the fact that loss of 5 - 10% of her  current body weight will have the most impact on her diabetes management.  CDE Consult will be initiated . Exercise, and detailed carbohydrates information provided  -  detailed on discharge instructions.  5) hypothyroidism: She is currently on levothyroxine 50 mcg p.o. every morning, her dose adjusted recently.  - We discussed about the correct intake of her thyroid hormone, on empty stomach at fasting, with water, separated by at least 30 minutes from breakfast and other medications,  and separated by more than 4 hours from calcium, iron, multivitamins, acid reflux medications (PPIs). -Patient is made aware of the fact that thyroid hormone replacement is needed for life, dose to be adjusted by periodic monitoring of thyroid function tests.   6) Chronic Care/Health Maintenance:  -she  is on  Statin medications and  is encouraged to initiate and continue to follow up with Ophthalmology, Dentist,  Podiatrist at least yearly or according to recommendations, and advised to   stay away from smoking. I have recommended yearly flu vaccine and pneumonia vaccine at least every 5 years; moderate intensity exercise for up to 150 minutes weekly; and  sleep for at least 7 hours a day.  - she is  advised to maintain close follow up with Barbie BannerWilson, Fred H, MD for primary care needs, as well as her other providers for optimal and coordinated care.  - Time spent with the patient: 45 minutes, of which >50% was spent in obtaining information about her symptoms, reviewing her previous labs/studies, evaluations, and treatments, counseling her about her currently uncontrolled, complicated type 2 diabetes, hyperlipidemia, hypertension, and developing plans for long term treatment based on the latest  standards of care/guidelines.  Please refer to " Patient Self Inventory" in the Media  tab for reviewed elements of pertinent patient history.  Heide ScalesPatricia G Robledo participated in the discussions, expressed understanding, and voiced agreement with the above plans.  All questions were answered to her satisfaction. she is encouraged to contact clinic should she have any questions or concerns prior to her return visit.  Follow up plan: - Return in about 2 weeks (around 02/13/2019) for Follow up with Pre-visit Labs, Meter, and Logs.  Marquis LunchGebre Treylon Henard, MD [facility] [address] [place], [place] [address] Phone: [phone]  Fax: [phone]    01/30/2019, 10:50 AM  This note was partially dictated with voice recognition software. Similar sounding words can be transcribed inadequately or may not  be corrected upon review.

## 2019-01-31 LAB — T4, FREE: Free T4: 1.3 ng/dL (ref 0.8–1.8)

## 2019-01-31 LAB — COMPLETE METABOLIC PANEL WITH GFR
AG Ratio: 1.3 (calc) (ref 1.0–2.5)
ALT: 15 U/L (ref 6–29)
AST: 11 U/L (ref 10–35)
Albumin: 4 g/dL (ref 3.6–5.1)
Alkaline phosphatase (APISO): 91 U/L (ref 37–153)
BUN/Creatinine Ratio: 19 (calc) (ref 6–22)
BUN: 21 mg/dL (ref 7–25)
CO2: 29 mmol/L (ref 20–32)
Calcium: 9.4 mg/dL (ref 8.6–10.4)
Chloride: 99 mmol/L (ref 98–110)
Creat: 1.12 mg/dL — ABNORMAL HIGH (ref 0.50–1.05)
GFR, Est African American: 63 mL/min/{1.73_m2} (ref >=60)
GFR, Est Non African American: 54 mL/min/{1.73_m2} — ABNORMAL LOW (ref >=60)
Globulin: 3.1 g/dL (calc) (ref 1.9–3.7)
Glucose, Bld: 282 mg/dL — ABNORMAL HIGH (ref 65–99)
Potassium: 4.1 mmol/L (ref 3.5–5.3)
Sodium: 138 mmol/L (ref 135–146)
Total Bilirubin: 0.7 mg/dL (ref 0.2–1.2)
Total Protein: 7.1 g/dL (ref 6.1–8.1)

## 2019-01-31 LAB — HEMOGLOBIN A1C
Hgb A1c MFr Bld: 9.7 % of total Hgb — ABNORMAL HIGH (ref <5.7)
Mean Plasma Glucose: 232 (calc)
eAG (mmol/L): 12.8 (calc)

## 2019-01-31 LAB — TSH: TSH: 2.07 mIU/L (ref 0.40–4.50)

## 2019-01-31 LAB — LIPID PANEL
Cholesterol: 251 mg/dL — ABNORMAL HIGH (ref <200)
HDL: 47 mg/dL — ABNORMAL LOW (ref >=50)
LDL Cholesterol (Calc): 163 mg/dL (calc) — ABNORMAL HIGH
Non-HDL Cholesterol (Calc): 204 mg/dL (calc) — ABNORMAL HIGH (ref <130)
Total CHOL/HDL Ratio: 5.3 (calc) — ABNORMAL HIGH (ref <5.0)
Triglycerides: 243 mg/dL — ABNORMAL HIGH (ref <150)

## 2019-01-31 LAB — MICROALBUMIN / CREATININE URINE RATIO
Creatinine, Urine: 231 mg/dL (ref 20–275)
Microalb Creat Ratio: 15 mcg/mg creat (ref <30)
Microalb, Ur: 3.4 mg/dL

## 2019-01-31 LAB — VITAMIN D 25 HYDROXY (VIT D DEFICIENCY, FRACTURES): Vit D, 25-Hydroxy: 12 ng/mL — ABNORMAL LOW (ref 30–100)

## 2019-02-06 ENCOUNTER — Encounter: Payer: Self-pay | Admitting: Nutrition

## 2019-02-06 ENCOUNTER — Other Ambulatory Visit: Payer: Self-pay

## 2019-02-06 ENCOUNTER — Encounter: Payer: Medicare HMO | Attending: "Endocrinology | Admitting: Nutrition

## 2019-02-06 VITALS — Ht 65.0 in | Wt 319.0 lb

## 2019-02-06 DIAGNOSIS — E781 Pure hyperglyceridemia: Secondary | ICD-10-CM

## 2019-02-06 DIAGNOSIS — Z6841 Body Mass Index (BMI) 40.0 and over, adult: Secondary | ICD-10-CM | POA: Insufficient documentation

## 2019-02-06 DIAGNOSIS — E118 Type 2 diabetes mellitus with unspecified complications: Secondary | ICD-10-CM | POA: Insufficient documentation

## 2019-02-06 DIAGNOSIS — IMO0002 Reserved for concepts with insufficient information to code with codable children: Secondary | ICD-10-CM

## 2019-02-06 DIAGNOSIS — E1165 Type 2 diabetes mellitus with hyperglycemia: Secondary | ICD-10-CM | POA: Insufficient documentation

## 2019-02-06 DIAGNOSIS — E782 Mixed hyperlipidemia: Secondary | ICD-10-CM

## 2019-02-06 NOTE — Progress Notes (Signed)
  Medical Nutrition Therapy:  Appt start time: 0800 end time:  0900.   Assessment:  Primary concerns today: Diabetes Type 2. Morbid Obesity, CABG x 3 in November 2019. Lives by herself. Her 2 daughters help her.  Doesn't like to cook for herself. Dm x 10+ yrs. Saw RD years ago. Eats 3 meals per day now. Saw Dr. Fransico Him, Endocrinology a week ago. Testing blood sugars before and 2 hours after meals-didn't know to only check before meals. FBS 240's BL) 300's BD 260's Before bed; 200's. Levermir 80 units a day. Motivated to make changes and improve her Dm and lose weight. Lab Results  Component Value Date   HGBA1C 9.7 (H) 01/30/2019   Preferred Learning Style:  No preference indicated   Learning Readiness:  Ready  Change in progress   MEDICATIONS:    DIETARY INTAKE:   24-hr recall:  B ( AM): poptart or banana and sausage biscuit. Snk ( AM): nabs L ( PM): [place] sandwich , yogurt, water Snk ( PM): chips D ( PM): PInto beans  1 1/2-2 cups and chicken enchalata.-1, Diet soda. Snk ( PM):  Beverages: water  Usual physical activity: ADL   Estimated energy needs: 1200  calories 130 g carbohydrates 60 g protein 35 g fat  Progress Towards Goal(s):  In progress.   Nutritional Diagnosis:  NB-1.1 Food and nutrition-related knowledge deficit As related to Diabetes Type 2.  As evidenced by  A1C 9.7%.    Intervention: Nutrition and Diabetes education provided on My Plate, CHO counting, meal planning, portion sizes, timing of meals, avoiding snacks between meals unless having a low blood sugar, target ranges for A1C and blood sugars, signs/symptoms and treatment of hyper/hypoglycemia, monitoring blood sugars, taking medications as prescribed, benefits of exercising 30 minutes per day and prevention of complications of DM.  Marland KitchenGoals Follow My Plate Eat 2-3 carb choices per meal. Eat three balanced meals at meal times discussed Cut out sodas. Sweets, snacks and junk food Drink only  water. Check BS 4 times per day. Increase lower carb veggies with meals. Get A1C down to 7% Lose 1 lb weekly.  Teaching Method Utilized:  Visual Auditory Hands on  Handouts given during visit include:  The Plate Method   Meal Plan Card   Barriers to learning/adherence to lifestyle change: none  Demonstrated degree of understanding via:  Teach Back   Monitoring/Evaluation:  Dietary intake, exercise, , and body weight in 1 month(s).

## 2019-02-06 NOTE — Patient Instructions (Signed)
Goals Follow My Plate Eat 2-3 carb choices per meal. Eat three balanced meals at meal times discussed Cut out sodas. Sweets, snacks and junk food Drink only water. Check BS 4 times per day. Increase lower carb veggies with meals. Get A1C down to 7% Lose 1 lb weekly.

## 2019-02-09 ENCOUNTER — Encounter (HOSPITAL_COMMUNITY): Payer: Self-pay | Admitting: Psychiatry

## 2019-02-09 ENCOUNTER — Ambulatory Visit (INDEPENDENT_AMBULATORY_CARE_PROVIDER_SITE_OTHER): Payer: Medicare HMO | Admitting: Psychiatry

## 2019-02-09 ENCOUNTER — Other Ambulatory Visit: Payer: Self-pay

## 2019-02-09 DIAGNOSIS — F3130 Bipolar disorder, current episode depressed, mild or moderate severity, unspecified: Secondary | ICD-10-CM

## 2019-02-09 MED ORDER — RISPERIDONE 2 MG PO TABS: 2.0000 mg | ORAL_TABLET | Freq: Two times a day (BID) | ORAL | 2 refills | Status: DC

## 2019-02-09 MED ORDER — TRAZODONE HCL 100 MG PO TABS: 100.0000 mg | ORAL_TABLET | Freq: Every day | ORAL | 2 refills | Status: DC

## 2019-02-09 MED ORDER — BUSPIRONE HCL 30 MG PO TABS: 30.0000 mg | ORAL_TABLET | Freq: Three times a day (TID) | ORAL | 2 refills | Status: DC

## 2019-02-09 MED ORDER — ESCITALOPRAM OXALATE 20 MG PO TABS: 20.0000 mg | ORAL_TABLET | Freq: Two times a day (BID) | ORAL | 2 refills | Status: DC

## 2019-02-09 MED ORDER — HYDROXYZINE HCL 50 MG PO TABS: 50.0000 mg | ORAL_TABLET | Freq: Four times a day (QID) | ORAL | 2 refills | Status: DC | PRN

## 2019-02-09 NOTE — Progress Notes (Signed)
Virtual Visit via Telephone Note  I connected with Maria ScalesPatricia G Naser on 02/09/19 at 10:00 AM EDT by telephone and verified that I am speaking with the correct person using two identifiers.   I discussed the limitations, risks, security and privacy concerns of performing an evaluation and management service by telephone and the availability of in person appointments. I also discussed with the patient that there may be a patient responsible charge related to this service. The patient expressed understanding and agreed to proceed.       I discussed the assessment and treatment plan with the patient. The patient was provided an opportunity to ask questions and all were answered. The patient agreed with the plan and demonstrated an understanding of the instructions.   The patient was advised to call back or seek an in-person evaluation if the symptoms worsen or if the condition fails to improve as anticipated.  I provided 15 minutes of non-face-to-face time during this encounter.   Maria Rudereborah Noble Cicalese, MD  [facility] MD/PA/NP OP Progress Note  02/09/2019 10:09 AM [name]  MRN:  [id number]  Chief Complaint:  Chief Complaint    Manic Behavior; Depression; Anxiety; Follow-up     HPI: This patient is a [age] year oldwidowed white female [place] She has 2 grown daughters and one 429 year old grandson. She is on disability for both back pain and bipolar disorder.  The patient was referred by Dr[facility] Lolly [facility]Arfeen [facility]. The patient would like to start coming here as it is too far for her to drive to the other clinic.  The patient states that she has had problems with mood since her mid 7040s. She began losing her temper having severe outburst mood swings and lashing out at people. 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 She began being paranoid and accusing people of doing things behind her back or talking about  her. She was hospitalized 4 times the last time being at the [facility] in 2011. At that time she was very depressed hallucinating and hearing command hallucinations to cut herself or kill herself. She was Stabilized on a combination of lithium Wellbutrin and Risperdal.She had seen Dr. Lolly MustacheArfeen for a while and had been transferred to me after it became too far to drive [place] [facility].  The patient returns for follow-up after 4 weeks.  She is again assessed by telephone due to the coronavirus pandemic.  She states that she is feeling a little bit better this time.  Last time we increased Risperdal to 4 mg daily to help with her mood swings.  She is also working with a nutritionist now to get her diabetes under better control.  She states that her numbers are dropping down into the 100s instead of the 200s.  Overall she states her mood is better but she still has occasional crying spells due to loneliness.  She is talking to her daughters every day but cannot see them that often because they both work and and public jobs.  Her church has opened up for driving services and this is helped her mood considerably because she is at least able to see people from the church.  She denies any auditory visual hallucinations paranoia or thoughts of self-harm.  She feels that the BuSpar is helping with her anxiety. Visit Diagnosis:    ICD-10-CM   1. Bipolar I disorder, most recent episode depressed (HCC) F31.30  Past Psychiatric History: 4 previous hospitalizations for bipolar disorder, long-term outpatient treatment  Past Medical History:  Past Medical History:  Diagnosis Date  . Asthma   . Back pain   . CAD (coronary artery disease)    a. 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   . Chest pain   . Diabetes mellitus type II   . Headache   . High  cholesterol   . Hypertension   . Obesity, morbid (HCC)   . Other and unspecified bipolar disorders   . 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 Thyroid disease     Past Surgical History:  Procedure Laterality Date  . TUBAL LIGATION     Bilateral    Family Psychiatric History: See below  Family History:  Family History  Problem Relation Age of Onset  . Heart attack Maternal Uncle   . Depression Maternal Uncle   . Depression Mother   . Alcohol abuse Maternal Grandfather   . Alcohol abuse Maternal Uncle   . Depression Maternal Grandmother     Social History:  Social History   Socioeconomic History  . Marital status: Widowed    Spouse name: Not on file  . Number of children: Not on file  . Years of education: Not on file  . Highest education level: Not on file  Occupational History  . Occupation: Disabled    Associate Professormployer: UNEMPLOYED  Social Needs  . Financial resource strain: Not on file  . Food insecurity:    Worry: Not on file    Inability: Not on file  . Transportation needs:    Medical: Not on file    Non-medical: Not on file  Tobacco Use  . Smoking status: Never Smoker  . Smokeless tobacco: Never Used  Substance and Sexual Activity  . Alcohol use: No    Alcohol/week: 0.0 standard drinks  . Drug use: No    Comment: 05/13/2016 per pt no  . Sexual activity: Yes    Birth control/protection: Surgical  Lifestyle  . Physical activity:    Days per week: Not on file    Minutes per session: Not on file  . Stress: Not on file  Relationships  . Social connections:    Talks on phone: Not on file    Gets together: Not on file    Attends religious service: Not on file    Active member of club or organization: Not on file    Attends meetings of clubs or organizations: Not on file    Relationship status: Not on file  Other Topics Concern  . Not on file  Social History Narrative   Married   No regular exercise    Allergies:  Allergies  Allergen Reactions  . Amoxicillin  Swelling and Other (See Comments)    Oral swelling   . Doxycycline Nausea And Vomiting  . Levofloxacin Other (See Comments)    headache  . Propoxyphene N-Acetaminophen Nausea And Vomiting    Metabolic Disorder Labs: Lab Results  Component Value Date   HGBA1C 9.7 (H) 01/30/2019   MPG 232 01/30/2019   MPG 260 07/17/2018   No results found for: PROLACTIN Lab Results  Component Value Date   CHOL 251 (H) 01/30/2019   TRIG 243 (H) 01/30/2019   HDL 47 (L) 01/30/2019   CHOLHDL 5.3 (H) 01/30/2019   VLDL UNABLE TO CALCULATE IF TRIGLYCERIDE OVER 400 mg/dL 16/10/960410/10/2008  LDLCALC 163 (H) 01/30/2019   LDLCALC  06/28/2009    UNABLE TO CALCULATE IF TRIGLYCERIDE OVER 400 mg/dL        Total Cholesterol/HDL:CHD Risk Coronary Heart Disease Risk Table                     Men   Women  1/2 Average Risk   3.4   3.3  Average Risk       5.0   4.4  2 X Average Risk   9.6   7.1  3 X Average Risk  23.4   11.0        Use the calculated Patient Ratio above and the CHD Risk Table to determine the patient's CHD Risk.        ATP III CLASSIFICATION (LDL):  <100     mg/dL   Optimal  [id number]  mg/dL   Near or Above                    Optimal  130-159  mg/dL   Borderline  [id number]  mg/dL   High  >811>190     mg/dL   Very High   Lab Results  Component Value Date   TSH 2.07 01/30/2019   TSH 4.88 (H) 02/03/2016    Therapeutic Level Labs: Lab Results  Component Value Date   LITHIUM 0.6 (L) 07/07/2016   LITHIUM 0.6 (L) 03/10/2016   No results found for: VALPROATE No components found for:  CBMZ  Current Medications: Current Outpatient Medications  Medication Sig Dispense Refill  . albuterol (PROVENTIL HFA;VENTOLIN HFA) 108 (90 Base) MCG/ACT inhaler Inhale 2 puffs into the lungs every 6 (six) hours as needed for wheezing or shortness of breath. 1 Inhaler 0  . amLODipine (NORVASC) 10 MG tablet Take 1 tablet (10 mg total) by mouth daily. For high blood pressure 30 tablet 0  . atorvastatin (LIPITOR) 80 MG  tablet Take 1 tablet (80 mg total) by mouth every morning. (Patient taking differently: Take 80 mg by mouth daily at 6 PM. ) 30 tablet 0  . busPIRone (BUSPAR) 30 MG tablet Take 1 tablet (30 mg total) by mouth 3 (three) times daily. 270 tablet 2  . clopidogrel (PLAVIX) 75 MG tablet Take 1 tablet (75 mg total) by mouth daily. 30 tablet 0  . escitalopram (LEXAPRO) 20 MG tablet Take 1 tablet (20 mg total) by mouth 2 (two) times daily. for mood control 180 tablet 2  . furosemide (LASIX) 20 MG tablet Take 1 tablet (20 mg total) by mouth daily. 90 tablet 3  . glipiZIDE (GLUCOTROL) 10 MG tablet Take 1 tablet (10 mg total) by mouth 2 (two) times daily before a meal. 60 tablet 0  . hydrOXYzine (ATARAX/VISTARIL) 50 MG tablet Take 1 tablet (50 mg total) by mouth every 6 (six) hours as needed for anxiety. 90 tablet 2  . LEVEMIR 100 UNIT/ML injection Inject 80 Units into the skin at bedtime.    Marland Kitchen. levothyroxine (SYNTHROID, LEVOTHROID) 50 MCG tablet Take 1 tablet (50 mcg total) by mouth daily before breakfast. For hypothyroidism 30 tablet 0  . metFORMIN (GLUCOPHAGE XR) 500 MG 24 hr tablet Take 1 tablet (500 mg total) by mouth daily with breakfast. 30 tablet 3  . metoprolol succinate (TOPROL XL) 25 MG 24 hr tablet Take 1 tablet (25 mg total) by mouth daily. 90 tablet 3  . nitroGLYCERIN (NITROSTAT) 0.4 MG SL tablet Place under the tongue.    . pantoprazole (PROTONIX) 40  MG tablet Take 1 tablet (40 mg total) by mouth daily. 30 tablet 1  . Potassium Chloride ER 20 MEQ TBCR Take 20 mEq by mouth daily. 90 tablet 3  . risperiDONE (RISPERDAL) 2 MG tablet Take 1 tablet (2 mg total) by mouth 2 (two) times daily. 180 tablet 2  . rizatriptan (MAXALT) 5 MG tablet Take by mouth.    . traZODone (DESYREL) 100 MG tablet Take 1 tablet (100 mg total) by mouth at bedtime. 90 tablet 2   No current facility-administered medications for this visit.      Musculoskeletal: Strength & Muscle Tone: decreased Gait & Station:  normal Patient leans: N/A  Psychiatric Specialty Exam: Review of Systems  Musculoskeletal: Positive for back pain and joint pain.  All other systems reviewed and are negative.   There were no vitals taken for this visit.There is no height or weight on file to calculate BMI.  General Appearance: NA  Eye Contact:  NA  Speech:  Clear and Coherent  Volume:  Normal  Mood:  Euthymic  Affect:  NA  Thought Process:  Goal Directed  Orientation:  Full (Time, Place, and Person)  Thought Content: WDL   Suicidal Thoughts:  No  Homicidal Thoughts:  No  Memory:  Immediate;   Good Recent;   Good Remote;   Fair  Judgement:  Fair  Insight:  Fair  Psychomotor Activity:  Decreased  Concentration:  Concentration: Good and Attention Span: Good  Recall:  Good  Fund of Knowledge: Fair  Language: Good  Akathisia:  No  Handed:  Right  AIMS (if indicated): not done  Assets:  Communication Skills Desire for Improvement Resilience Social Support Talents/Skills  ADL's:  Intact  Cognition: WNL  Sleep:  Good   Screenings: AIMS     [facility]  AIMS Total Score  0    AUDIT     Ad[facility] ([facility]  Alcohol Use Disorder Identification Test Final Score (AUDIT)  0    MDI     [facility]  Total Score (max 50)  25    PHQ2-9     Nutrition from 02/06/2019 [facility]21[facility]2016 [facility]  PHQ-2 Total Score  0  4  PHQ-9 Total Score  -  15       Assessment and Plan: This patient is a 58 year old female with a history of significant heart disease type 2 diabetes and bipolar disorder.  She is doing better on Risperdal 2 mg twice daily but will still need to keep a close watch on her  blood sugars.  She will continue trazodone 100 mg at bedtime for sleep, BuSpar 3 times daily for anxiety, hydroxyzine 50 mg every 6 hours as needed for anxiety and Lexapro 20 mg twice daily for depression.  She will return to see me in 2 months or call sooner if needed   Maria Ruder, MD 02/09/2019, 10:09 AM

## 2019-02-13 ENCOUNTER — Ambulatory Visit: Payer: Medicare HMO | Admitting: "Endocrinology

## 2019-02-14 ENCOUNTER — Other Ambulatory Visit: Payer: Self-pay

## 2019-02-14 ENCOUNTER — Ambulatory Visit (INDEPENDENT_AMBULATORY_CARE_PROVIDER_SITE_OTHER): Payer: Medicare HMO | Admitting: "Endocrinology

## 2019-02-14 ENCOUNTER — Encounter: Payer: Self-pay | Admitting: "Endocrinology

## 2019-02-14 DIAGNOSIS — E1159 Type 2 diabetes mellitus with other circulatory complications: Secondary | ICD-10-CM | POA: Diagnosis not present

## 2019-02-14 DIAGNOSIS — E782 Mixed hyperlipidemia: Secondary | ICD-10-CM | POA: Diagnosis not present

## 2019-02-14 DIAGNOSIS — I10 Essential (primary) hypertension: Secondary | ICD-10-CM | POA: Diagnosis not present

## 2019-02-14 DIAGNOSIS — E559 Vitamin D deficiency, unspecified: Secondary | ICD-10-CM

## 2019-02-14 MED ORDER — VITAMIN D3 125 MCG (5000 UT) PO CAPS: 5000.0000 [IU] | ORAL_CAPSULE | Freq: Every day | ORAL | 0 refills | Status: AC

## 2019-02-14 NOTE — Progress Notes (Signed)
02/14/2019                                                     Endocrinology Telehealth Visit Follow up Note -During COVID -19 Pandemic  This visit type was conducted due to national recommendations for restrictions regarding the COVID-19 Pandemic  in an effort to limit this patient's exposure and mitigate transmission of the corona virus.  Due to her co-morbid illnesses, [name] is at  moderate to high risk for complications without adequate follow up.  This format is felt to be most appropriate for her at this time.  I connected with this patient on 02/14/2019   by telephone and verified that I am speaking with the correct person using two identifiers. [name], 07/05/1961. she has verbally consented to this visit. All issues noted in this document were discussed and addressed. The format was not optimal for physical exam.    Subjective:    Patient ID: [name], female    DOB: 06/22/1961.  [name] is being engaged in telehealth via telephone in follow-up  for management of currently uncontrolled symptomatic diabetes requested by  Barbie BannerWilson, Fred H, MD.   Past Medical History:  Diagnosis Date  . Asthma   . Back pain   . CAD (coronary artery disease)    a. 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   . Chest pain   . Diabetes mellitus type II   . Headache   . High cholesterol   . Hypertension   . Obesity, morbid (HCC)   . Other and unspecified bipolar disorders   . 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 Thyroid disease     Past Surgical History:  Procedure Laterality Date  . TUBAL LIGATION     Bilateral    Social History   Socioeconomic History  . Marital status: Widowed    Spouse name: Not on file  . Number of children: Not on file  . Years of education: Not on file  . Highest education level: Not  on file  Occupational History  . Occupation: Disabled    Associate Professormployer: UNEMPLOYED  Social Needs  . Financial resource strain: Not on file  . Food insecurity:    Worry: Not on file    Inability: Not on file  . Transportation needs:    Medical: Not on file    Non-medical: Not on file  Tobacco Use  . Smoking status: Never Smoker  . Smokeless tobacco: Never Used  Substance and Sexual Activity  . Alcohol use: No    Alcohol/week: 0.0 standard drinks  . Drug use: No    Comment: 05/13/2016 per pt no  . Sexual activity: Yes    Birth control/protection: Surgical  Lifestyle  . Physical activity:    Days per week: Not on file    Minutes per session: Not on file  . Stress: Not on file  Relationships  . Social connections:    Talks on phone: Not on file    Gets together: Not on file    Attends religious service: Not on file    Active member of club or organization: Not on file    Attends meetings of clubs or organizations: Not  on file    Relationship status: Not on file  Other Topics Concern  . Not on file  Social History Narrative   Married   No regular exercise    Family History  Problem Relation Age of Onset  . Heart attack Maternal Uncle   . Depression Maternal Uncle   . Depression Mother   . Alcohol abuse Maternal Grandfather   . Alcohol abuse Maternal Uncle   . Depression Maternal Grandmother     Outpatient Encounter Medications as of 02/14/2019  Medication Sig  . albuterol (PROVENTIL HFA;VENTOLIN HFA) 108 (90 Base) MCG/ACT inhaler Inhale 2 puffs into the lungs every 6 (six) hours as needed for wheezing or shortness of breath.  Marland Kitchen amLODipine (NORVASC) 10 MG tablet Take 1 tablet (10 mg total) by mouth daily. For high blood pressure  . atorvastatin (LIPITOR) 80 MG tablet Take 1 tablet (80 mg total) by mouth every morning. (Patient taking differently: Take 80 mg by mouth daily at 6 PM. )  . busPIRone (BUSPAR) 30 MG tablet Take 1 tablet (30 mg total) by mouth 3 (three) times  daily.  . Cholecalciferol (VITAMIN D3) 125 MCG (5000 UT) CAPS Take 1 capsule (5,000 Units total) by mouth daily.  . clopidogrel (PLAVIX) 75 MG tablet Take 1 tablet (75 mg total) by mouth daily.  Marland Kitchen escitalopram (LEXAPRO) 20 MG tablet Take 1 tablet (20 mg total) by mouth 2 (two) times daily. for mood control  . furosemide (LASIX) 20 MG tablet Take 1 tablet (20 mg total) by mouth daily.  Marland Kitchen glipiZIDE (GLUCOTROL) 10 MG tablet Take 1 tablet (10 mg total) by mouth 2 (two) times daily before a meal.  . hydrOXYzine (ATARAX/VISTARIL) 50 MG tablet Take 1 tablet (50 mg total) by mouth every 6 (six) hours as needed for anxiety.  Marland Kitchen LEVEMIR 100 UNIT/ML injection Inject 80 Units into the skin at bedtime.  Marland Kitchen levothyroxine (SYNTHROID, LEVOTHROID) 50 MCG tablet Take 1 tablet (50 mcg total) by mouth daily before breakfast. For hypothyroidism  . metoprolol succinate (TOPROL XL) 25 MG 24 hr tablet Take 1 tablet (25 mg total) by mouth daily.  . nitroGLYCERIN (NITROSTAT) 0.4 MG SL tablet Place under the tongue.  . pantoprazole (PROTONIX) 40 MG tablet Take 1 tablet (40 mg total) by mouth daily.  . Potassium Chloride ER 20 MEQ TBCR Take 20 mEq by mouth daily.  . risperiDONE (RISPERDAL) 2 MG tablet Take 1 tablet (2 mg total) by mouth 2 (two) times daily.  . rizatriptan (MAXALT) 5 MG tablet Take by mouth.  . traZODone (DESYREL) 100 MG tablet Take 1 tablet (100 mg total) by mouth at bedtime.  . [DISCONTINUED] metFORMIN (GLUCOPHAGE XR) 500 MG 24 hr tablet Take 1 tablet (500 mg total) by mouth daily with breakfast.   No facility-administered encounter medications on file as of 02/14/2019.     ALLERGIES: Allergies  Allergen Reactions  . Amoxicillin Swelling and Other (See Comments)    Oral swelling   . Doxycycline Nausea And Vomiting  . Levofloxacin Other (See Comments)    headache  . Propoxyphene N-Acetaminophen Nausea And Vomiting    VACCINATION STATUS: Immunization History  Administered Date(s) Administered  .  Influenza,inj,Quad PF,6+ Mos 07/18/2018  . Pneumococcal Polysaccharide-23 07/18/2018    Diabetes  She presents for her follow-up diabetic visit. She has type 2 diabetes mellitus. Onset time: She was diagnosed at approximate age of 39 years. Her disease course has been worsening. There are no hypoglycemic associated symptoms. Pertinent negatives for hypoglycemia include  no confusion, headaches, pallor, seizures or tremors. Associated symptoms include fatigue, polydipsia and polyuria. Pertinent negatives for diabetes include no chest pain and no polyphagia. There are no hypoglycemic complications. Diabetic complications include heart disease. (Status post triple coronary bypass in November 2019.) Risk factors for coronary artery disease include diabetes mellitus, dyslipidemia, family history, hypertension, obesity, sedentary lifestyle and tobacco exposure. Current diabetic treatment includes insulin injections and oral agent (monotherapy). She is following a generally unhealthy diet. When asked about meal planning, she reported none. She has not had a previous visit with a dietitian. She rarely participates in exercise. Her home blood glucose trend is fluctuating minimally. Her breakfast blood glucose range is generally >200 mg/dl. Her lunch blood glucose range is generally >200 mg/dl. Her dinner blood glucose range is generally >200 mg/dl. Her overall blood glucose range is >200 mg/dl. (Her previsit labs show A1c of 9.7%.) An ACE inhibitor/angiotensin II receptor blocker is being taken. Eye exam is current.  Hypertension  This is a chronic problem. The current episode started more than 1 year ago. The problem is controlled. Pertinent negatives include no chest pain, headaches, palpitations or shortness of breath. Risk factors for coronary artery disease include dyslipidemia, diabetes mellitus, obesity, sedentary lifestyle, smoking/tobacco exposure and family history. Past treatments include calcium channel  blockers. Hypertensive end-organ damage includes CAD/MI.  Hyperlipidemia  This is a chronic problem. The current episode started more than 1 year ago. Exacerbating diseases include diabetes, hypothyroidism and obesity. Pertinent negatives include no chest pain, myalgias or shortness of breath. Risk factors for coronary artery disease include diabetes mellitus, dyslipidemia, family history, obesity, hypertension, a sedentary lifestyle and post-menopausal.     Objective:    There were no vitals taken for this visit.  Wt Readings from Last 3 Encounters:  02/06/19 (!) 319 lb (144.7 kg)  01/30/19 (!) 319 lb (144.7 kg)  09/13/18 (!) 320 lb 12.8 oz (145.5 kg)      CMP ( most recent) CMP     Component Value Date/Time   NA 138 01/30/2019 0934   K 4.1 01/30/2019 0934   CL 99 01/30/2019 0934   CO2 29 01/30/2019 0934   GLUCOSE 282 (H) 01/30/2019 0934   BUN 21 01/30/2019 0934   CREATININE 1.12 (H) 01/30/2019 0934   CALCIUM 9.4 01/30/2019 0934   PROT 7.1 01/30/2019 0934   ALBUMIN 3.0 (L) 07/17/2018 0514   AST 11 01/30/2019 0934   ALT 15 01/30/2019 0934   ALKPHOS 75 07/17/2018 0514   BILITOT 0.7 01/30/2019 0934   GFRNONAA 54 (L) 01/30/2019 0934   GFRAA 63 01/30/2019 0934     Diabetic Labs (most recent): Lab Results  Component Value Date   HGBA1C 9.7 (H) 01/30/2019   HGBA1C 10.7 (H) 07/17/2018   HGBA1C 11.0 (H) 10/14/2014     Lipid Panel ( most recent) Lipid Panel     Component Value Date/Time   CHOL 251 (H) 01/30/2019 0934   TRIG 243 (H) 01/30/2019 0934   HDL 47 (L) 01/30/2019 0934   CHOLHDL 5.3 (H) 01/30/2019 0934   VLDL UNABLE TO CALCULATE IF TRIGLYCERIDE OVER 400 mg/dL 83/05/4075 8088   LDLCALC 163 (H) 01/30/2019 0934       Results for TYTEANNA, BUCKMAN (MRN [id number]) as of 01/30/2019 12:18  Ref. Range 10/14/2014 16:31 02/03/2016 13:51  TSH Latest Units: mIU/L 4.395 4.88 (H)  Thyroxine (T4) Latest Ref Range: 4.5 - 12.0 ug/dL  6.8  Free Thyroxine Index Latest Ref  Range: 1.4 - 3.8  2.2  T3 Uptake Latest Ref Range: 22 - 35 %  32    Assessment & Plan:   1. DM type 2 causing vascular disease (HCC)  - [name] has currently uncontrolled symptomatic type 2 DM since 58 years of age. -She reports significantly above target glycemic profile, A1c of 9.7%.   -her diabetes is complicated by coronary artery disease status post triple bypass in November 2019, morbid obesity, history of smoking, sedentary life and she remains at a high risk for more acute and chronic complications which include CAD, CVA, CKD, retinopathy, and neuropathy. These are all discussed in detail with her.  - I have counseled her on diet management and weight loss, by adopting a carbohydrate restricted/protein rich diet.  - Patient admits there is a room for improvement in her diet and drink choices. -  Suggestion is made for her to avoid simple carbohydrates  from her diet including Cakes, Sweet Desserts / Pastries, Ice Cream, Soda (diet and regular), Sweet Tea, Candies, Chips, Cookies, Store Bought Juices, Alcohol in Excess of  1-2 drinks a day, Artificial Sweeteners, and "Sugar-free" Products. This will help patient to have stable blood glucose profile and potentially avoid unintended weight gain.   - I encouraged her to switch to  unprocessed or minimally processed complex starch and increased protein intake (animal or plant source), fruits, and vegetables.  - she is advised to stick to a routine mealtimes to eat 3 meals  a day and avoid unnecessary snacks ( to snack only to correct hypoglycemia).   - she will be scheduled with Norm SaltPenny Crumpton, RDN, CDE for individualized diabetes education.  - I have approached her with the following individualized plan to manage diabetes and patient agrees:   -Based on her current glycemic burden as well as her comorbidities, she will likely require intensive treatment with basal/bolus insulin in order for her to achieve control of  diabetes to target.  -She has room on her basal insulin, advised to increase her Levemir to 100 units nightly, continue to monitor blood glucose 4 times a day -before meals and at bedtime.   - she is encouraged to call clinic for blood glucose levels less than 70 or above 200 mg /dl. - she is advised to continue glipizide 10 mg p.o. twice daily with breakfast and supper, -She did not tolerate the Metformin extended release, had to stop it.     - she will be considered for incretin therapy as appropriate next visit.  - Patient specific target  A1c;  LDL, HDL, Triglycerides, and  Waist Circumference were discussed in detail.  2) Blood Pressure /Hypertension: she is advised to home monitor blood pressure and report if > 140/90 on 2 separate readings.  she is advised to continue her current medications including metoprolol 25 mg p.o. daily, amlodipine 10mg  p.o. daily with breakfast .  She also has Lasix as needed.  She will be considered for low-dose ACE inhibitors on subsequent visits. 3) Lipids/Hyperlipidemia:   Review of her recent lipid panel showed uncontrolled triglyceridemia greater than 700.  She is advised to continue Lipitor 80 mg qhs.  -  Side effects and precautions discussed with her.  4)  Weight/Diet:  There is no height or weight on file to calculate BMI.  -   clearly complicating her diabetes care.  I discussed with her the fact that loss of 5 - 10% of her  current body weight will have the most impact on her diabetes management.  CDE Consult will be initiated . Exercise, and detailed carbohydrates information provided  -  detailed on discharge instructions.  5) hypothyroidism: She is currently on levothyroxine 50 mcg p.o. every morning, her dose adjusted recently. Previsit thyroid function tests are consistent with appropriate replacement.  - We discussed about the correct intake of her thyroid hormone, on empty stomach at fasting, with water, separated by at least 30 minutes  from breakfast and other medications,  and separated by more than 4 hours from calcium, iron, multivitamins, acid reflux medications (PPIs). -Patient is made aware of the fact that thyroid hormone replacement is needed for life, dose to be adjusted by periodic monitoring of thyroid function tests.  6) vitamin D  Deficiency -I discussed and initiated vitamin D 3 5000 units daily for the next 90 days.   7) Chronic Care/Health Maintenance:  -she  is on  Statin medications and  is encouraged to initiate and continue to follow up with Ophthalmology, Dentist,  Podiatrist at least yearly or according to recommendations, and advised to   stay away from smoking. I have recommended yearly flu vaccine and pneumonia vaccine at least every 5 years; moderate intensity exercise for up to 150 minutes weekly; and  sleep for at least 7 hours a day.  - she is  advised to maintain close follow up with Barbie Banner, MD for primary care needs, as well as her other providers for optimal and coordinated care.  - Patient Care Time Today:  25 min, of which >50% was spent in reviewing her  current and  previous labs/studies, previous treatments, and medications doses and developing a plan for long-term care based on the latest recommendations for standards of care.  Maria Scales participated in the discussions, expressed understanding, and voiced agreement with the above plans.  All questions were answered to her satisfaction. she is encouraged to contact clinic should she have any questions or concerns prior to her return visit.    Follow up plan: - Return in about 4 weeks (around 03/14/2019) for Follow up with Meter and Logs Only - no Labs.  Marquis Lunch, MD [facility] [address] [place], [place] [address] Phone: [phone]  Fax: [phone]    02/14/2019, 5:14 PM  This note was partially dictated with voice recognition software. Similar sounding  words can be transcribed inadequately or may not  be corrected upon review.

## 2019-02-21 IMAGING — DX DG CHEST 2V
2 series · 2 of 2 positions shown · non-contrast
Comparison: 07/13/2017

CLINICAL DATA: Shortness of Breath

EXAM:
CHEST - 2 VIEW

[chest pa]
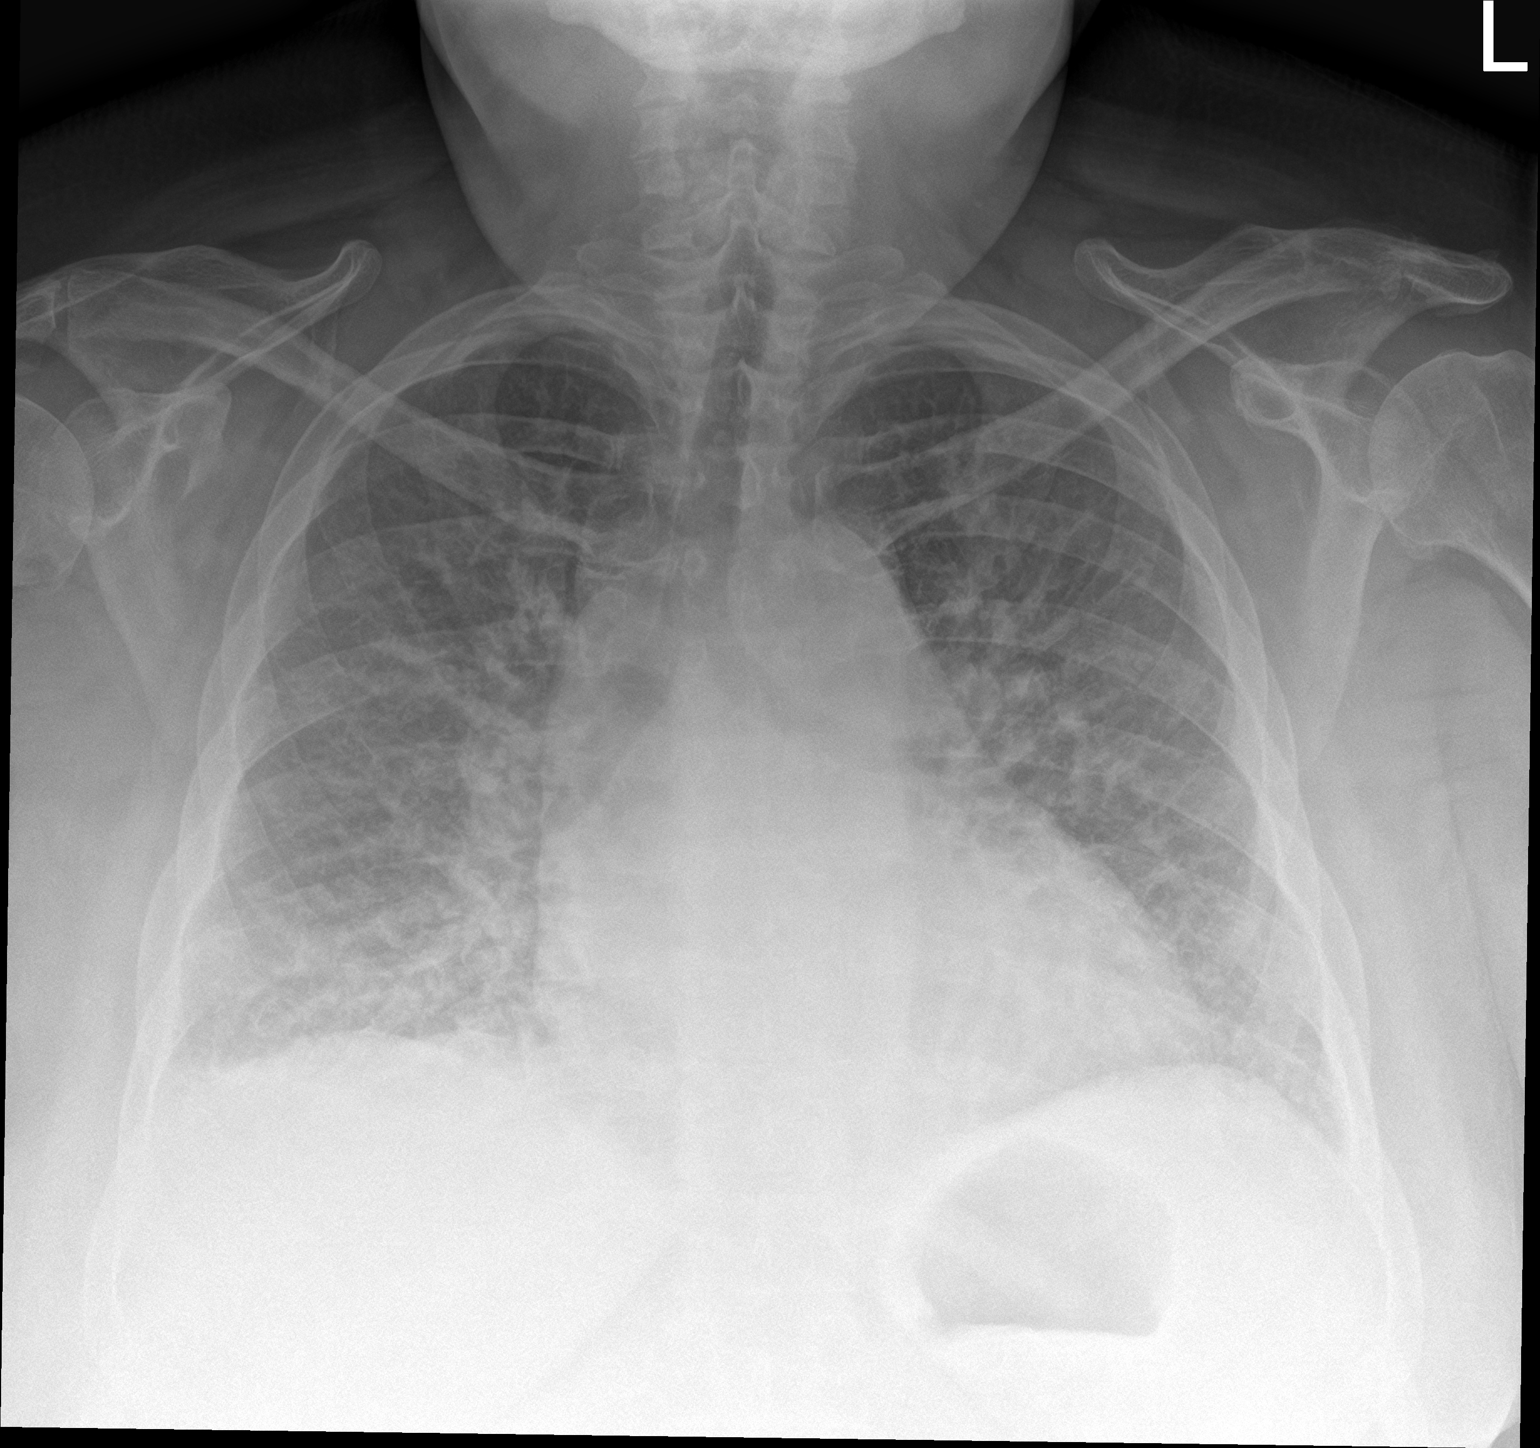

[chest lat]
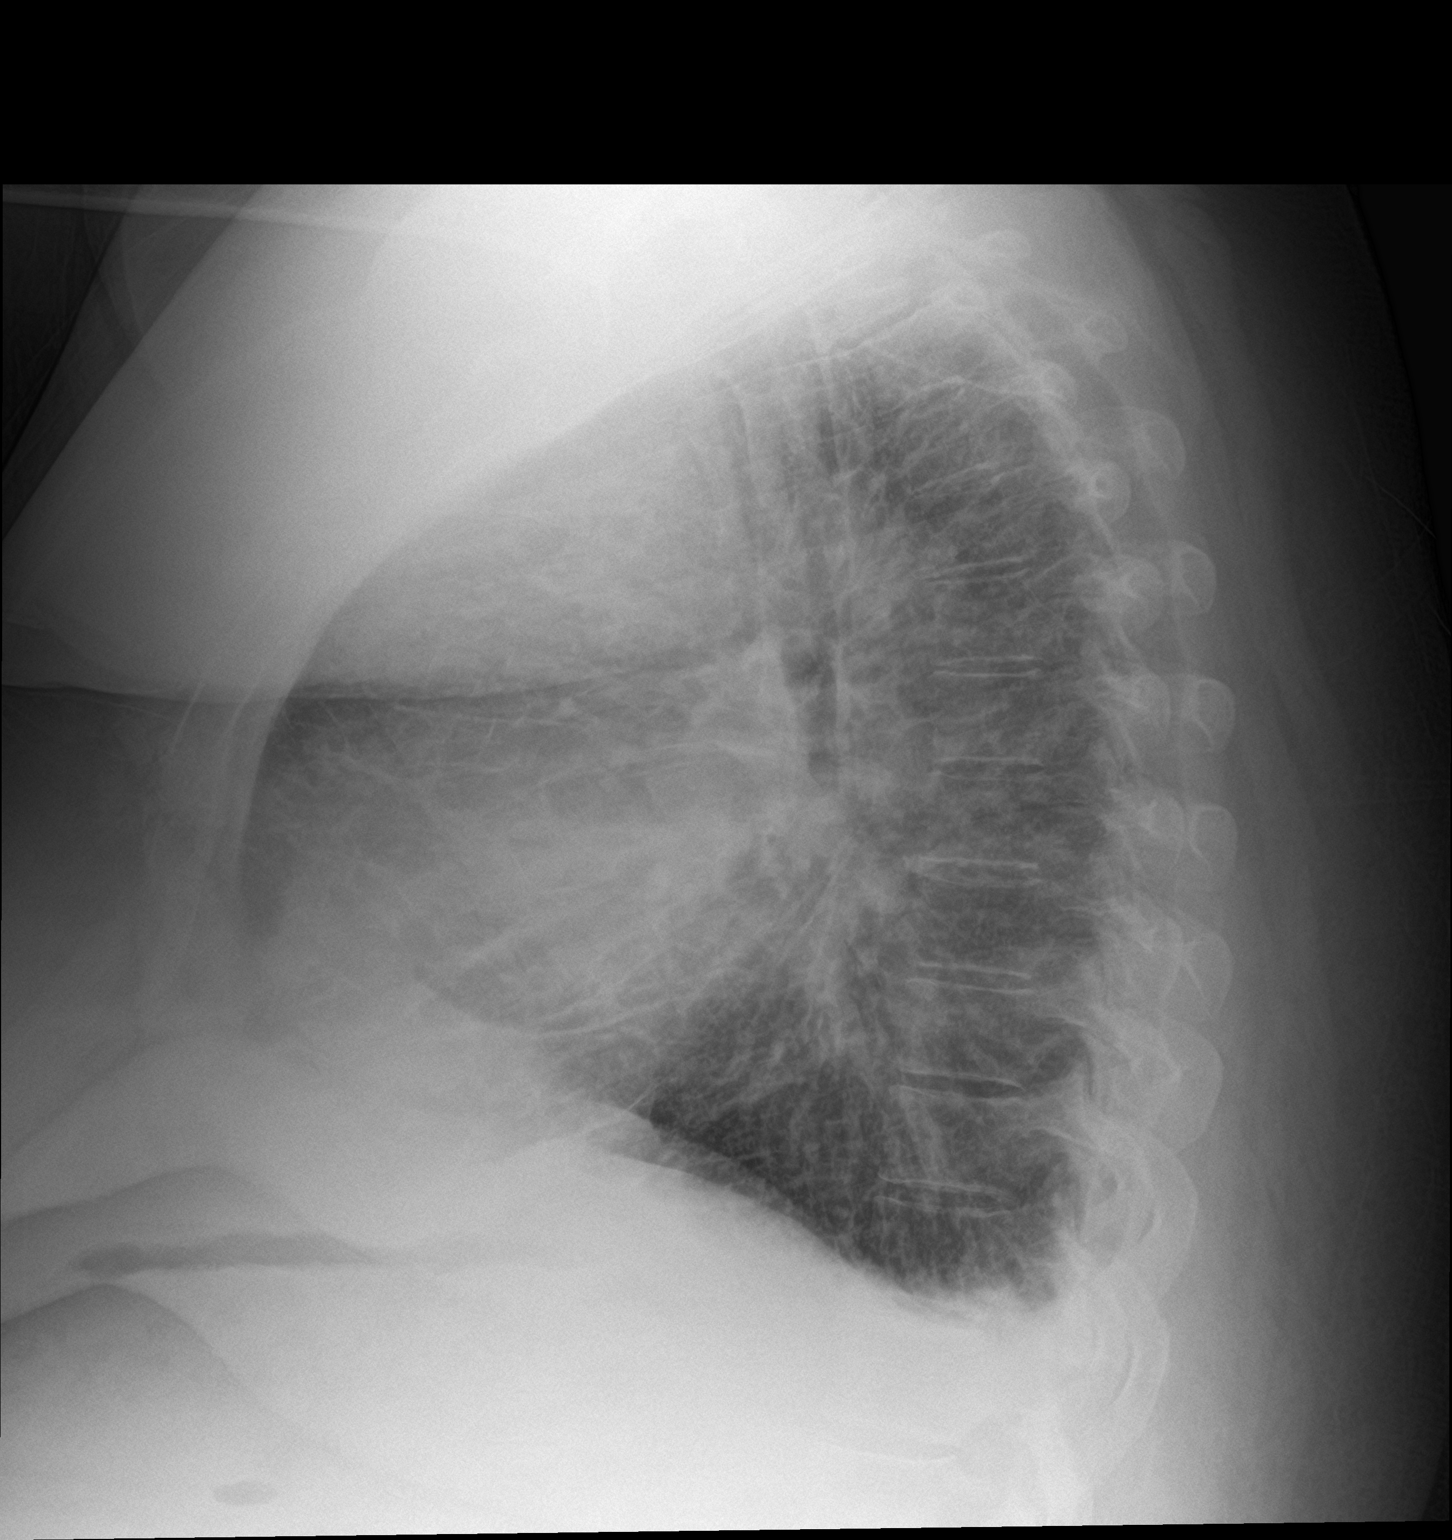

[2 of 2 positions shown; findings below may reference images not displayed]

FINDINGS: Cardiomegaly. Diffuse interstitial opacities throughout the lungs,
likely interstitial edema. Small bilateral effusions. No acute bony
abnormality.
IMPRESSION: Cardiomegaly with diffuse interstitial opacities, likely edema/CHF.

Small effusions.

## 2019-03-14 ENCOUNTER — Ambulatory Visit (INDEPENDENT_AMBULATORY_CARE_PROVIDER_SITE_OTHER): Payer: Medicare HMO | Admitting: "Endocrinology

## 2019-03-14 ENCOUNTER — Other Ambulatory Visit: Payer: Self-pay

## 2019-03-14 ENCOUNTER — Encounter: Payer: Self-pay | Admitting: "Endocrinology

## 2019-03-14 DIAGNOSIS — I10 Essential (primary) hypertension: Secondary | ICD-10-CM

## 2019-03-14 DIAGNOSIS — E1159 Type 2 diabetes mellitus with other circulatory complications: Secondary | ICD-10-CM

## 2019-03-14 DIAGNOSIS — E782 Mixed hyperlipidemia: Secondary | ICD-10-CM | POA: Diagnosis not present

## 2019-03-14 NOTE — Progress Notes (Signed)
03/14/2019                                                     Endocrinology Telehealth Visit Follow up Note -During COVID -19 Pandemic  This visit type was conducted due to national recommendations for restrictions regarding the COVID-19 Pandemic  in an effort to limit this patient's exposure and mitigate transmission of the corona virus.  Due to her co-morbid illnesses, [name] is at  moderate to high risk for complications without adequate follow up.  This format is felt to be most appropriate for her at this time.  I connected with this patient on 03/14/2019   by telephone and verified that I am speaking with the correct person using two identifiers. [name], 12-28-1960. she has verbally consented to this visit. All issues noted in this document were discussed and addressed. The format was not optimal for physical exam.    Subjective:    Patient ID: [name], female    DOB: 06/04/1961.  [name] is being engaged in telehealth via telephone in follow-up  for management of currently uncontrolled symptomatic diabetes requested by  Christain Sacramento, MD.   Past Medical History:  Diagnosis Date  . Asthma   . Back pain   . CAD (coronary artery disease)    a. 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   . Chest pain   . Diabetes mellitus type II   . Headache   . High cholesterol   . Hypertension   . Obesity, morbid ([place])   . Other and unspecified bipolar disorders   . 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 Thyroid disease     Past Surgical History:  Procedure Laterality Date  . TUBAL LIGATION     Bilateral    Social History   Socioeconomic History  . Marital status: Widowed    Spouse name: Not on file  . Number of children: Not on file  . Years of education: Not on file  . Highest education level: Not  on file  Occupational History  . Occupation: Disabled    Fish farm manager: UNEMPLOYED  Social Needs  . Financial resource strain: Not on file  . Food insecurity    Worry: Not on file    Inability: Not on file  . Transportation needs    Medical: Not on file    Non-medical: Not on file  Tobacco Use  . Smoking status: Never Smoker  . Smokeless tobacco: Never Used  Substance and Sexual Activity  . Alcohol use: No    Alcohol/week: 0.0 standard drinks  . Drug use: No    Comment: 05/13/2016 per pt no  . Sexual activity: Yes    Birth control/protection: Surgical  Lifestyle  . Physical activity    Days per week: Not on file    Minutes per session: Not on file  . Stress: Not on file  Relationships  . Social Herbalist on phone: Not on file    Gets together: Not on file    Attends religious service: Not on file    Active member of club or organization: Not on file    Attends meetings of clubs or organizations: Not  on file    Relationship status: Not on file  Other Topics Concern  . Not on file  Social History Narrative   Married   No regular exercise    Family History  Problem Relation Age of Onset  . Heart attack Maternal Uncle   . Depression Maternal Uncle   . Depression Mother   . Alcohol abuse Maternal Grandfather   . Alcohol abuse Maternal Uncle   . Depression Maternal Grandmother     Outpatient Encounter Medications as of 03/14/2019  Medication Sig  . albuterol (PROVENTIL HFA;VENTOLIN HFA) 108 (90 Base) MCG/ACT inhaler Inhale 2 puffs into the lungs every 6 (six) hours as needed for wheezing or shortness of breath.  Marland Kitchen. amLODipine (NORVASC) 10 MG tablet Take 1 tablet (10 mg total) by mouth daily. For high blood pressure  . atorvastatin (LIPITOR) 80 MG tablet Take 1 tablet (80 mg total) by mouth every morning. (Patient taking differently: Take 80 mg by mouth daily at 6 PM. )  . busPIRone (BUSPAR) 30 MG tablet Take 1 tablet (30 mg total) by mouth 3 (three) times daily.   . Cholecalciferol (VITAMIN D3) 125 MCG (5000 UT) CAPS Take 1 capsule (5,000 Units total) by mouth daily.  . clopidogrel (PLAVIX) 75 MG tablet Take 1 tablet (75 mg total) by mouth daily.  Marland Kitchen. escitalopram (LEXAPRO) 20 MG tablet Take 1 tablet (20 mg total) by mouth 2 (two) times daily. for mood control  . furosemide (LASIX) 20 MG tablet Take 1 tablet (20 mg total) by mouth daily.  Marland Kitchen. glipiZIDE (GLUCOTROL) 10 MG tablet Take 1 tablet (10 mg total) by mouth 2 (two) times daily before a meal.  . hydrOXYzine (ATARAX/VISTARIL) 50 MG tablet Take 1 tablet (50 mg total) by mouth every 6 (six) hours as needed for anxiety.  Marland Kitchen. LEVEMIR 100 UNIT/ML injection Inject 110 Units into the skin at bedtime.  Marland Kitchen. levothyroxine (SYNTHROID, LEVOTHROID) 50 MCG tablet Take 1 tablet (50 mcg total) by mouth daily before breakfast. For hypothyroidism  . metoprolol succinate (TOPROL XL) 25 MG 24 hr tablet Take 1 tablet (25 mg total) by mouth daily.  . nitroGLYCERIN (NITROSTAT) 0.4 MG SL tablet Place under the tongue.  . pantoprazole (PROTONIX) 40 MG tablet Take 1 tablet (40 mg total) by mouth daily.  . Potassium Chloride ER 20 MEQ TBCR Take 20 mEq by mouth daily.  . risperiDONE (RISPERDAL) 2 MG tablet Take 1 tablet (2 mg total) by mouth 2 (two) times daily.  . rizatriptan (MAXALT) 5 MG tablet Take by mouth.  . traZODone (DESYREL) 100 MG tablet Take 1 tablet (100 mg total) by mouth at bedtime.   No facility-administered encounter medications on file as of 03/14/2019.     ALLERGIES: Allergies  Allergen Reactions  . Amoxicillin Swelling and Other (See Comments)    Oral swelling   . Doxycycline Nausea And Vomiting  . Levofloxacin Other (See Comments)    headache  . Propoxyphene N-Acetaminophen Nausea And Vomiting    VACCINATION STATUS: Immunization History  Administered Date(s) Administered  . Influenza,inj,Quad PF,6+ Mos 07/18/2018  . Pneumococcal Polysaccharide-23 07/18/2018    Diabetes She presents for her  follow-up diabetic visit. She has type 2 diabetes mellitus. Onset time: She was diagnosed at approximate age of 39 years. Her disease course has been worsening. There are no hypoglycemic associated symptoms. Pertinent negatives for hypoglycemia include no confusion, headaches, pallor, seizures or tremors. Associated symptoms include fatigue, polydipsia and polyuria. Pertinent negatives for diabetes include no chest pain and  no polyphagia. There are no hypoglycemic complications. Symptoms are worsening. Diabetic complications include heart disease. (Status post triple coronary bypass in November 2019.) Risk factors for coronary artery disease include diabetes mellitus, dyslipidemia, family history, hypertension, obesity, sedentary lifestyle and tobacco exposure. Current diabetic treatment includes insulin injections and oral agent (monotherapy). She is following a generally unhealthy diet. When asked about meal planning, she reported none. She has not had a previous visit with a dietitian. She rarely participates in exercise. Her home blood glucose trend is fluctuating minimally. Her breakfast blood glucose range is generally >200 mg/dl. Her lunch blood glucose range is generally >200 mg/dl. Her dinner blood glucose range is generally >200 mg/dl. Her overall blood glucose range is >200 mg/dl. (Her previsit labs show A1c of 9.7%.) An ACE inhibitor/angiotensin II receptor blocker is being taken. Eye exam is current.  Hypertension This is a chronic problem. The current episode started more than 1 year ago. The problem is controlled. Pertinent negatives include no chest pain, headaches, palpitations or shortness of breath. Risk factors for coronary artery disease include dyslipidemia, diabetes mellitus, obesity, sedentary lifestyle, smoking/tobacco exposure and family history. Past treatments include calcium channel blockers. Hypertensive end-organ damage includes CAD/MI.  Hyperlipidemia This is a chronic problem.  The current episode started more than 1 year ago. Exacerbating diseases include diabetes, hypothyroidism and obesity. Pertinent negatives include no chest pain, myalgias or shortness of breath. Risk factors for coronary artery disease include diabetes mellitus, dyslipidemia, family history, obesity, hypertension, a sedentary lifestyle and post-menopausal.     Objective:    There were no vitals taken for this visit.  Wt Readings from Last 3 Encounters:  02/06/19 (!) 319 lb (144.7 kg)  01/30/19 (!) 319 lb (144.7 kg)  09/13/18 (!) 320 lb 12.8 oz (145.5 kg)      CMP ( most recent) CMP     Component Value Date/Time   NA 138 01/30/2019 0934   K 4.1 01/30/2019 0934   CL 99 01/30/2019 0934   CO2 29 01/30/2019 0934   GLUCOSE 282 (H) 01/30/2019 0934   BUN 21 01/30/2019 0934   CREATININE 1.12 (H) 01/30/2019 0934   CALCIUM 9.4 01/30/2019 0934   PROT 7.1 01/30/2019 0934   ALBUMIN 3.0 (L) 07/17/2018 0514   AST 11 01/30/2019 0934   ALT 15 01/30/2019 0934   ALKPHOS 75 07/17/2018 0514   BILITOT 0.7 01/30/2019 0934   GFRNONAA 54 (L) 01/30/2019 0934   GFRAA 63 01/30/2019 0934     Diabetic Labs (most recent): Lab Results  Component Value Date   HGBA1C 9.7 (H) 01/30/2019   HGBA1C 10.7 (H) 07/17/2018   HGBA1C 11.0 (H) 10/14/2014     Lipid Panel ( most recent) Lipid Panel     Component Value Date/Time   CHOL 251 (H) 01/30/2019 0934   TRIG 243 (H) 01/30/2019 0934   HDL 47 (L) 01/30/2019 0934   CHOLHDL 5.3 (H) 01/30/2019 0934   VLDL UNABLE TO CALCULATE IF TRIGLYCERIDE OVER 400 mg/dL 83/15/1761 6073   LDLCALC 163 (H) 01/30/2019 0934        Assessment & Plan:   1. DM type 2 causing vascular disease (HCC)  - [name] has currently uncontrolled symptomatic type 2 DM since 58 years of age. -She reports significantly above target glycemic profile, A1c of 9.7%.   -her diabetes is complicated by coronary artery disease status post triple bypass in November 2019, morbid  obesity, history of smoking, sedentary life and she remains at a high risk  for more acute and chronic complications which include CAD, CVA, CKD, retinopathy, and neuropathy. These are all discussed in detail with her.  - I have counseled her on diet management and weight loss, by adopting a carbohydrate restricted/protein rich diet. - she  admits there is a room for improvement in her diet and drink choices. -  Suggestion is made for her to avoid simple carbohydrates  from her diet including Cakes, Sweet Desserts / Pastries, Ice Cream, Soda (diet and regular), Sweet Tea, Candies, Chips, Cookies, Sweet Pastries,  Store Bought Juices, Alcohol in Excess of  1-2 drinks a day, Artificial Sweeteners, Coffee Creamer, and "Sugar-free" Products. This will help patient to have stable blood glucose profile and potentially avoid unintended weight gain.   - I encouraged her to switch to  unprocessed or minimally processed complex starch and increased protein intake (animal or plant source), fruits, and vegetables.  - she is advised to stick to a routine mealtimes to eat 3 meals  a day and avoid unnecessary snacks ( to snack only to correct hypoglycemia).   - she will be scheduled with Norm Salt, RDN, CDE for individualized diabetes education.  - I have approached her with the following individualized plan to manage diabetes and patient agrees:   -Based on her current glycemic burden as well as her comorbidities, she will likely require intensive treatment with basal/bolus insulin in order for her to achieve control of diabetes to target.  -She has room on her basal insulin, advised to increase her Levemir to 110 units nightly, continue to monitor blood glucose 4 times a day -before meals and at bedtime.   - she is encouraged to call clinic for blood glucose levels less than 70 or above 200 mg /dl. - she is advised to continue glipizide 10 mg p.o. twice daily with breakfast and supper, -She did not  tolerate the Metformin extended release, had to stop it.     - she will be considered for incretin therapy as appropriate next visit.  - Patient specific target  A1c;  LDL, HDL, Triglycerides, and  Waist Circumference were discussed in detail.  2) Blood Pressure /Hypertension: she is advised to home monitor blood pressure and report if > 140/90 on 2 separate readings.  she is advised to continue her current medications including metoprolol 25 mg p.o. daily, amlodipine 10mg  p.o. daily with breakfast .  She also has Lasix as needed.  She will be considered for low-dose ACE inhibitors on subsequent visits. 3) Lipids/Hyperlipidemia:   Review of her recent lipid panel showed uncontrolled triglyceridemia greater than 700.  She is advised to continue Lipitor 80 mg qhs.  -  Side effects and precautions discussed with her.  4)  Weight/Diet:  She is obese. -   clearly complicating her diabetes care.  I discussed with her the fact that loss of 5 - 10% of her  current body weight will have the most impact on her diabetes management.  CDE Consult will be initiated . Exercise, and detailed carbohydrates information provided  -  detailed on discharge instructions.  5) hypothyroidism: She is currently on levothyroxine 50 mcg p.o. every morning, her dose adjusted recently. Previsit thyroid function tests are consistent with appropriate replacement.   - We discussed about the correct intake of her thyroid hormone, on empty stomach at fasting, with water, separated by at least 30 minutes from breakfast and other medications,  and separated by more than 4 hours from calcium, iron, multivitamins, acid reflux medications (  PPIs). -Patient is made aware of the fact that thyroid hormone replacement is needed for life, dose to be adjusted by periodic monitoring of thyroid function tests.   6) vitamin D  Deficiency -I discussed and initiated vitamin D 3 5000 units daily for the next 90 days.   7) Chronic Care/Health  Maintenance:  -she  is on  Statin medications and  is encouraged to initiate and continue to follow up with Ophthalmology, Dentist,  Podiatrist at least yearly or according to recommendations, and advised to   stay away from smoking. I have recommended yearly flu vaccine and pneumonia vaccine at least every 5 years; moderate intensity exercise for up to 150 minutes weekly; and  sleep for at least 7 hours a day.  - she is  advised to maintain close follow up with Barbie BannerWilson, Fred H, MD for primary care needs, as well as her other providers for optimal and coordinated care.  - Time spent with the patient: 25 min, of which >50% was spent in reviewing her  current and  previous labs/studies, BG readings,  previous treatments, and medications doses and developing a plan for long-term care based on the latest recommendations for standards of care. Please refer to " Patient Self Inventory" in the Media  tab for reviewed elements of pertinent patient history. Heide ScalesPatricia G Savarese participated in the discussions, expressed understanding, and voiced agreement with the above plans.  All questions were answered to her satisfaction. she is encouraged to contact clinic should she have any questions or concerns prior to her return visit.    Follow up plan: - Return in about 9 weeks (around 05/16/2019) for Follow up with Pre-visit Labs, Meter, and Logs.  Marquis LunchGebre Yailen Zemaitis, MD [facility] [address] [place], [place] [address] Phone: [phone]  Fax: [phone]    03/14/2019, 4:55 PM  This note was partially dictated with voice recognition software. Similar sounding words can be transcribed inadequately or may not  be corrected upon review.

## 2019-03-20 ENCOUNTER — Ambulatory Visit: Payer: Medicare HMO | Admitting: Nutrition

## 2019-04-05 ENCOUNTER — Telehealth: Payer: Self-pay | Admitting: "Endocrinology

## 2019-04-05 ENCOUNTER — Telehealth: Payer: Self-pay

## 2019-04-05 DIAGNOSIS — E1159 Type 2 diabetes mellitus with other circulatory complications: Secondary | ICD-10-CM

## 2019-04-05 MED ORDER — LEVEMIR 100 UNIT/ML ~~LOC~~ SOLN: 110.0000 [IU] | Freq: Every day | SUBCUTANEOUS | 1 refills | Status: DC

## 2019-04-05 NOTE — Telephone Encounter (Signed)
[name], CMA  

## 2019-04-05 NOTE — Telephone Encounter (Signed)
Patient left a VM that the pharmacy is going off her old RX for her insulin and her insurance will not approve it. She said that Dr Dorris Fetch changed it. Please advise

## 2019-04-09 ENCOUNTER — Telehealth: Payer: Self-pay

## 2019-04-09 DIAGNOSIS — E1159 Type 2 diabetes mellitus with other circulatory complications: Secondary | ICD-10-CM

## 2019-04-09 MED ORDER — LEVEMIR 100 UNIT/ML ~~LOC~~ SOLN: 110.0000 [IU] | Freq: Every day | SUBCUTANEOUS | 1 refills | Status: DC

## 2019-04-09 NOTE — Telephone Encounter (Addendum)
Patient left a VM that it is much cheaper on her if her Levemir could be called in for only one valve. [place] needs a needs script

## 2019-04-09 NOTE — Telephone Encounter (Signed)
Done

## 2019-04-09 NOTE — Telephone Encounter (Signed)
[name], CMA  

## 2019-04-11 ENCOUNTER — Ambulatory Visit (INDEPENDENT_AMBULATORY_CARE_PROVIDER_SITE_OTHER): Payer: Medicare HMO | Admitting: Psychiatry

## 2019-04-11 ENCOUNTER — Other Ambulatory Visit: Payer: Self-pay

## 2019-04-11 ENCOUNTER — Encounter (HOSPITAL_COMMUNITY): Payer: Self-pay | Admitting: Psychiatry

## 2019-04-11 DIAGNOSIS — F3130 Bipolar disorder, current episode depressed, mild or moderate severity, unspecified: Secondary | ICD-10-CM

## 2019-04-11 MED ORDER — ESCITALOPRAM OXALATE 20 MG PO TABS: 20.0000 mg | ORAL_TABLET | Freq: Three times a day (TID) | ORAL | 2 refills | Status: DC

## 2019-04-11 MED ORDER — TRAZODONE HCL 100 MG PO TABS: 100.0000 mg | ORAL_TABLET | Freq: Every day | ORAL | 2 refills | Status: DC

## 2019-04-11 MED ORDER — RISPERIDONE 2 MG PO TABS: 2.0000 mg | ORAL_TABLET | Freq: Two times a day (BID) | ORAL | 2 refills | Status: DC

## 2019-04-11 MED ORDER — HYDROXYZINE HCL 50 MG PO TABS: 50.0000 mg | ORAL_TABLET | Freq: Four times a day (QID) | ORAL | 2 refills | Status: DC | PRN

## 2019-04-11 MED ORDER — BUSPIRONE HCL 30 MG PO TABS: 30.0000 mg | ORAL_TABLET | Freq: Three times a day (TID) | ORAL | 2 refills | Status: DC

## 2019-04-11 NOTE — Progress Notes (Signed)
Virtual Visit via Telephone Note  I connected with [name] on 04/11/19 at 11:00 AM EDT by telephone and verified that I am speaking with the correct person using two identifiers.   I discussed the limitations, risks, security and privacy concerns of performing an evaluation and management service by telephone and the availability of in person appointments. I also discussed with the patient that there may be a patient responsible charge related to this service. The patient expressed understanding and agreed to proceed.     I discussed the assessment and treatment plan with the patient. The patient was provided an opportunity to ask questions and all were answered. The patient agreed with the plan and demonstrated an understanding of the instructions.   The patient was advised to call back or seek an in-person evaluation if the symptoms worsen or if the condition fails to improve as anticipated.  I provided 15 minutes of non-face-to-face time during this encounter.   Diannia Rudereborah Jonluke Cobbins, MD  [facility] MD/PA/NP OP Progress Note  04/11/2019 11:24 AM [name]  MRN:  [id number]  Chief Complaint:  Chief Complaint    Depression; Anxiety; Manic Behavior; Follow-up     HPI: This patient is a 89 year oldwidowed white female [place]. She has 2 grown daughters and one 59 year old grandson. She is on disability for both back pain and bipolar disorder.  The patient was referred by Dr. Lolly MustacheArfeen [facility]. The patient would like to start coming here as it is too far for her to drive to the other clinic.  The patient states that she has had problems with mood since her mid 10040s. She began losing her temper having severe outburst mood swings and lashing out at people. 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 She began being paranoid and accusing people of doing things behind her back or talking about her.  She was hospitalized 4 times the last time being at the behavioral [facility] hospital in 2011. At that time she was very depressed hallucinating and hearing command hallucinations to cut herself or kill herself. She was Stabilized on a combination of lithium Wellbutrin and Risperdal.She had seen Dr. Lolly MustacheArfeen for a while and had been transferred to me after it became too far to drive [facility]  Patient returns for follow-up after 2 months.  She states she is doing okay but could be better.  She is depressed because she feels isolated.  She is going to church on Wednesdays and Fridays and practicing social distance there.  She is talking to family members just about every day.  She does not have a computer or tablet so she cannot really participate in other zoom meetings etc.  She is going out to take care of her daughter's dog every day for a little bit.  She can no longer go to [employer] or visit elderly relatives.  She denies any thoughts of self-harm or suicidal ideation.  She is sleeping well at night.  She does feel like she is somewhat anxious but she is taking both BuSpar and hydroxyzine.  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  I suggested that we increase theLexapro to 60 mg daily and we will try to get this approved for her.  I encouraged her to connect with people as much as it is possible Visit Diagnosis:    ICD-10-CM   1. Bipolar I  disorder, most recent episode depressed ([place])  F31.30     Past Psychiatric History: 4 previous hospitalizations for bipolar disorder, long-term outpatient treatment  Past Medical History:  Past Medical History:  Diagnosis Date  . Asthma   . Back pain   . CAD (coronary artery disease)    a. 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   . Chest pain   . Diabetes mellitus type II   .  Headache   . High cholesterol   . Hypertension   . Obesity, morbid ([place])   . Other and unspecified bipolar disorders   . 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 Thyroid disease     Past Surgical History:  Procedure Laterality Date  . TUBAL LIGATION     Bilateral    Family Psychiatric History: see below  Family History:  Family History  Problem Relation Age of Onset  . Heart attack Maternal Uncle   . Depression Maternal Uncle   . Depression Mother   . Alcohol abuse Maternal Grandfather   . Alcohol abuse Maternal Uncle   . Depression Maternal Grandmother     Social History:  Social History   Socioeconomic History  . Marital status: Widowed    Spouse name: Not on file  . Number of children: Not on file  . Years of education: Not on file  . Highest education level: Not on file  Occupational History  . Occupation: Disabled    Fish farm manager: UNEMPLOYED  Social Needs  . Financial resource strain: Not on file  . Food insecurity    Worry: Not on file    Inability: Not on file  . Transportation needs    Medical: Not on file    Non-medical: Not on file  Tobacco Use  . Smoking status: Never Smoker  . Smokeless tobacco: Never Used  Substance and Sexual Activity  . Alcohol use: No    Alcohol/week: 0.0 standard drinks  . Drug use: No    Comment: 05/13/2016 per pt no  . Sexual activity: Yes    Birth control/protection: Surgical  Lifestyle  . Physical activity    Days per week: Not on file    Minutes per session: Not on file  . Stress: Not on file  Relationships  . Social Herbalist on phone: Not on file    Gets together: Not on file    Attends religious service: Not on file    Active member of club or organization: Not on file    Attends meetings of clubs or organizations: Not on file    Relationship status: Not on file  Other Topics Concern  . Not on file  Social History Narrative   Married   No regular exercise    Allergies:  Allergies  Allergen Reactions   . Amoxicillin Swelling and Other (See Comments)    Oral swelling   . Doxycycline Nausea And Vomiting  . Levofloxacin Other (See Comments)    headache  . Propoxyphene N-Acetaminophen Nausea And Vomiting    Metabolic Disorder Labs: Lab Results  Component Value Date   HGBA1C 9.7 (H) 01/30/2019   MPG 232 01/30/2019   MPG 260 07/17/2018   No results found for: PROLACTIN Lab Results  Component Value Date   CHOL 251 (H) 01/30/2019   TRIG 243 (H) 01/30/2019   HDL 47 (L) 01/30/2019   CHOLHDL 5.3 (H) 01/30/2019  VLDL UNABLE TO CALCULATE IF TRIGLYCERIDE OVER 400 mg/dL 85/46/2703   LDLCALC 500 (H) 01/30/2019   LDLCALC  06/28/2009    UNABLE TO CALCULATE IF TRIGLYCERIDE OVER 400 mg/dL        Total Cholesterol/HDL:CHD Risk Coronary Heart Disease Risk Table                     Men   Women  1/2 Average Risk   3.4   3.3  Average Risk       5.0   4.4  2 X Average Risk   9.6   7.1  3 X Average Risk  23.4   11.0        Use the calculated Patient Ratio above and the CHD Risk Table to determine the patient's CHD Risk.        ATP III CLASSIFICATION (LDL):  <100     mg/dL   Optimal  938-182  mg/dL   Near or Above                    Optimal  130-159  mg/dL   Borderline  993-716  mg/dL   High  >967     mg/dL   Very High   Lab Results  Component Value Date   TSH 2.07 01/30/2019   TSH 4.88 (H) 02/03/2016    Therapeutic Level Labs: Lab Results  Component Value Date   LITHIUM 0.6 (L) 07/07/2016   LITHIUM 0.6 (L) 03/10/2016   No results found for: VALPROATE No components found for:  CBMZ  Current Medications: Current Outpatient Medications  Medication Sig Dispense Refill  . albuterol (PROVENTIL HFA;VENTOLIN HFA) 108 (90 Base) MCG/ACT inhaler Inhale 2 puffs into the lungs every 6 (six) hours as needed for wheezing or shortness of breath. 1 Inhaler 0  . amLODipine (NORVASC) 10 MG tablet Take 1 tablet (10 mg total) by mouth daily. For high blood pressure 30 tablet 0  . atorvastatin  (LIPITOR) 80 MG tablet Take 1 tablet (80 mg total) by mouth every morning. (Patient taking differently: Take 80 mg by mouth daily at 6 PM. ) 30 tablet 0  . busPIRone (BUSPAR) 30 MG tablet Take 1 tablet (30 mg total) by mouth 3 (three) times daily. 270 tablet 2  . Cholecalciferol (VITAMIN D3) 125 MCG (5000 UT) CAPS Take 1 capsule (5,000 Units total) by mouth daily. 90 capsule 0  . clopidogrel (PLAVIX) 75 MG tablet Take 1 tablet (75 mg total) by mouth daily. 30 tablet 0  . escitalopram (LEXAPRO) 20 MG tablet Take 1 tablet (20 mg total) by mouth 3 (three) times daily. for mood control 270 tablet 2  . furosemide (LASIX) 20 MG tablet Take 1 tablet (20 mg total) by mouth daily. 90 tablet 3  . glipiZIDE (GLUCOTROL) 10 MG tablet Take 1 tablet (10 mg total) by mouth 2 (two) times daily before a meal. 60 tablet 0  . hydrOXYzine (ATARAX/VISTARIL) 50 MG tablet Take 1 tablet (50 mg total) by mouth every 6 (six) hours as needed for anxiety. 90 tablet 2  . LEVEMIR 100 UNIT/ML injection Inject 1.1 mLs (110 Units total) into the skin at bedtime. 10 mL 1  . levothyroxine (SYNTHROID, LEVOTHROID) 50 MCG tablet Take 1 tablet (50 mcg total) by mouth daily before breakfast. For hypothyroidism 30 tablet 0  . metoprolol succinate (TOPROL XL) 25 MG 24 hr tablet Take 1 tablet (25 mg total) by mouth daily. 90 tablet 3  . nitroGLYCERIN (NITROSTAT) 0.4  MG SL tablet Place under the tongue.    . pantoprazole (PROTONIX) 40 MG tablet Take 1 tablet (40 mg total) by mouth daily. 30 tablet 1  . Potassium Chloride ER 20 MEQ TBCR Take 20 mEq by mouth daily. 90 tablet 3  . risperiDONE (RISPERDAL) 2 MG tablet Take 1 tablet (2 mg total) by mouth 2 (two) times daily. 180 tablet 2  . rizatriptan (MAXALT) 5 MG tablet Take by mouth.    . traZODone (DESYREL) 100 MG tablet Take 1 tablet (100 mg total) by mouth at bedtime. 90 tablet 2   No current facility-administered medications for this visit.      Musculoskeletal: Strength & Muscle Tone:  decreased Gait & Station: normal Patient leans: N/A  Psychiatric Specialty Exam: Review of Systems  Musculoskeletal: Positive for back pain and joint pain.  Psychiatric/Behavioral: Positive for depression. The patient is nervous/anxious.   All other systems reviewed and are negative.   There were no vitals taken for this visit.There is no height or weight on file to calculate BMI.  General Appearance: NA  Eye Contact:  NA  Speech:  Clear and Coherent  Volume:  Normal  Mood:  Anxious and Dysphoric  Affect:  NA  Thought Process:  Goal Directed  Orientation:  Full (Time, Place, and Person)  Thought Content: Rumination   Suicidal Thoughts:  No  Homicidal Thoughts:  No  Memory:  Immediate;   Good Recent;   Good Remote;   Fair  Judgement:  Fair  Insight:  Fair  Psychomotor Activity:  Restlessness  Concentration:  Concentration: Good and Attention Span: Good  Recall:  Good  Fund of Knowledge: Fair  Language: Good  Akathisia:  No  Handed:  Right  AIMS (if indicated): not done  Assets:  Communication Skills Desire for Improvement Resilience Social Support Talents/Skills  ADL's:  Intact  Cognition: WNL  Sleep:  Good   Screenings: AIMS     [facility]  AIMS Total Score  0    AUDIT     Ad[facility] ([facility]  Alcohol Use Disorder Identification Test Final Score (AUDIT)  0    MDI     [facility]  Total Score (max 50)  25    PHQ2-9     Nutrition from 02/06/2019 in [facility] [facility] from [facility]21/2016 [facility]  PHQ-2 Total Score  0  4  PHQ-9 Total Score  -  15       Assessment and Plan: This patient is a 58 year old female with a history of significant heart disease type  2 diabetes bipolar disorder.  She is feeling more socially isolated as is true for many people right now.  I encouraged her to talk to as many people as possible.  She will continue Resporal 2 mg twice daily for mood stabilization, trazodone 100 mg at bedtime for sleep, BuSpar 30 mg 3 times daily for anxiety, try hydroxyzine 50 mg every 6 hours as needed for anxiety and Lexapro will be increased to 60 mg daily for depression.  She will return to see me in 2 months   [name], MD 04/11/2019, 11:24 AM

## 2019-04-11 NOTE — Telephone Encounter (Signed)
Done

## 2019-04-16 ENCOUNTER — Other Ambulatory Visit: Payer: Self-pay | Admitting: "Endocrinology

## 2019-04-16 DIAGNOSIS — E1159 Type 2 diabetes mellitus with other circulatory complications: Secondary | ICD-10-CM

## 2019-04-17 ENCOUNTER — Telehealth (HOSPITAL_COMMUNITY): Payer: Self-pay

## 2019-04-17 NOTE — Telephone Encounter (Signed)
[employer] COVER[employer]  APPROVED ESCITALOPRAM 20MG  TABLET (270/90) EFFECTIVE 04/17/2019 TO 09/27/2019

## 2019-04-27 ENCOUNTER — Telehealth: Payer: Self-pay

## 2019-04-27 NOTE — Telephone Encounter (Signed)
Patient is aware and is coming to the office to pick up [place] samples

## 2019-04-27 NOTE — Telephone Encounter (Signed)
Patient is calling stating that she is in the donut hole with her insurance and her Levamir is costing to much over $100.00 and she cant afford to pick it up at the pharmacy she is asking if Dr. Dorris Fetch has any suggestions, please advise?

## 2019-05-02 ENCOUNTER — Telehealth: Payer: Self-pay | Admitting: "Endocrinology

## 2019-05-02 MED ORDER — TRESIBA FLEXTOUCH 200 UNIT/ML ~~LOC~~ SOPN: 110.0000 [IU] | PEN_INJECTOR | Freq: Every day | SUBCUTANEOUS | 0 refills | Status: DC

## 2019-05-02 NOTE — Telephone Encounter (Signed)
error 

## 2019-05-17 ENCOUNTER — Ambulatory Visit: Payer: Medicare HMO | Admitting: "Endocrinology

## 2019-05-22 LAB — HEMOGLOBIN A1C
Hgb A1c MFr Bld: 9.6 % of total Hgb — ABNORMAL HIGH (ref <5.7)
Mean Plasma Glucose: 229 (calc)
eAG (mmol/L): 12.7 (calc)

## 2019-05-22 LAB — COMPLETE METABOLIC PANEL WITH GFR
AG Ratio: 1.4 (calc) (ref 1.0–2.5)
ALT: 28 U/L (ref 6–29)
AST: 23 U/L (ref 10–35)
Albumin: 4 g/dL (ref 3.6–5.1)
Alkaline phosphatase (APISO): 69 U/L (ref 37–153)
BUN/Creatinine Ratio: 23 (calc) — ABNORMAL HIGH (ref 6–22)
BUN: 27 mg/dL — ABNORMAL HIGH (ref 7–25)
CO2: 32 mmol/L (ref 20–32)
Calcium: 9.6 mg/dL (ref 8.6–10.4)
Chloride: 102 mmol/L (ref 98–110)
Creat: 1.17 mg/dL — ABNORMAL HIGH (ref 0.50–1.05)
GFR, Est African American: 59 mL/min/{1.73_m2} — ABNORMAL LOW (ref >=60)
GFR, Est Non African American: 51 mL/min/{1.73_m2} — ABNORMAL LOW (ref >=60)
Globulin: 2.9 g/dL (calc) (ref 1.9–3.7)
Glucose, Bld: 102 mg/dL (ref 65–139)
Potassium: 4.4 mmol/L (ref 3.5–5.3)
Sodium: 143 mmol/L (ref 135–146)
Total Bilirubin: 0.6 mg/dL (ref 0.2–1.2)
Total Protein: 6.9 g/dL (ref 6.1–8.1)

## 2019-05-28 ENCOUNTER — Telehealth: Payer: Self-pay | Admitting: "Endocrinology

## 2019-05-28 NOTE — Telephone Encounter (Signed)
Not from her Tyler Aas, if continues , she needs to see PCP.

## 2019-05-28 NOTE — Telephone Encounter (Signed)
Pt.notified

## 2019-05-28 NOTE — Telephone Encounter (Signed)
Patient left a VM that she has been having diarrhea and is wondering if it is coming from the [place] samples she has been getting from [place]. She has been taking it about 3 weeks.

## 2019-05-29 ENCOUNTER — Encounter: Payer: Self-pay | Admitting: "Endocrinology

## 2019-05-29 ENCOUNTER — Other Ambulatory Visit: Payer: Self-pay

## 2019-05-29 ENCOUNTER — Ambulatory Visit (INDEPENDENT_AMBULATORY_CARE_PROVIDER_SITE_OTHER): Payer: Medicare HMO | Admitting: "Endocrinology

## 2019-05-29 DIAGNOSIS — E782 Mixed hyperlipidemia: Secondary | ICD-10-CM | POA: Diagnosis not present

## 2019-05-29 DIAGNOSIS — E1159 Type 2 diabetes mellitus with other circulatory complications: Secondary | ICD-10-CM

## 2019-05-29 DIAGNOSIS — I10 Essential (primary) hypertension: Secondary | ICD-10-CM | POA: Diagnosis not present

## 2019-05-29 NOTE — Progress Notes (Signed)
05/29/2019                                                     Endocrinology Telehealth Visit Follow up Note -During COVID -19 Pandemic  This visit type was conducted due to national recommendations for restrictions regarding the COVID-19 Pandemic  in an effort to limit this patient's exposure and mitigate transmission of the corona virus.  Due to her co-morbid illnesses, [name] is at  moderate to high risk for complications without adequate follow up.  This format is felt to be most appropriate for her at this time.  I connected with this patient on 05/29/2019   by telephone and verified that I am speaking with the correct person using two identifiers. [name], 1961-05-03. she has verbally consented to this visit. All issues noted in this document were discussed and addressed. The format was not optimal for physical exam.    Subjective:    Patient ID: [name], female    DOB: 1960-10-01.  [name] is being engaged in telehealth via telephone in follow-up  for management of currently uncontrolled symptomatic diabetes requested by  Christain Sacramento, MD.   Past Medical History:  Diagnosis Date  . Asthma   . Back pain   . CAD (coronary artery disease)    a. 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   . Chest pain   . Diabetes mellitus type II   . Headache   . High cholesterol   . Hypertension   . Obesity, morbid ([place])   . Other and unspecified bipolar disorders   . 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 Thyroid disease     Past Surgical History:  Procedure Laterality Date  . TUBAL LIGATION     Bilateral    Social History   Socioeconomic History  . Marital status: Widowed    Spouse name: Not on file  . Number of children: Not on file  . Years of education: Not on file  . Highest education level: Not on  file  Occupational History  . Occupation: Disabled    Fish farm manager: UNEMPLOYED  Social Needs  . Financial resource strain: Not on file  . Food insecurity    Worry: Not on file    Inability: Not on file  . Transportation needs    Medical: Not on file    Non-medical: Not on file  Tobacco Use  . Smoking status: Never Smoker  . Smokeless tobacco: Never Used  Substance and Sexual Activity  . Alcohol use: No    Alcohol/week: 0.0 standard drinks  . Drug use: No    Comment: 05/13/2016 per pt no  . Sexual activity: Yes    Birth control/protection: Surgical  Lifestyle  . Physical activity    Days per week: Not on file    Minutes per session: Not on file  . Stress: Not on file  Relationships  . Social Herbalist on phone: Not on file    Gets together: Not on file    Attends religious service: Not on file    Active member of club or organization: Not on file    Attends meetings of clubs or organizations: Not  on file    Relationship status: Not on file  Other Topics Concern  . Not on file  Social History Narrative   Married   No regular exercise    Family History  Problem Relation Age of Onset  . Heart attack Maternal Uncle   . Depression Maternal Uncle   . Depression Mother   . Alcohol abuse Maternal Grandfather   . Alcohol abuse Maternal Uncle   . Depression Maternal Grandmother     Outpatient Encounter Medications as of 05/29/2019  Medication Sig  . albuterol (PROVENTIL HFA;VENTOLIN HFA) 108 (90 Base) MCG/ACT inhaler Inhale 2 puffs into the lungs every 6 (six) hours as needed for wheezing or shortness of breath.  Marland Kitchen. amLODipine (NORVASC) 10 MG tablet Take 1 tablet (10 mg total) by mouth daily. For high blood pressure  . atorvastatin (LIPITOR) 80 MG tablet Take 1 tablet (80 mg total) by mouth every morning. (Patient taking differently: Take 80 mg by mouth daily at 6 PM. )  . busPIRone (BUSPAR) 30 MG tablet Take 1 tablet (30 mg total) by mouth 3 (three) times daily.  .  Cholecalciferol (VITAMIN D3) 125 MCG (5000 UT) CAPS Take 1 capsule (5,000 Units total) by mouth daily.  . clopidogrel (PLAVIX) 75 MG tablet Take 1 tablet (75 mg total) by mouth daily.  Marland Kitchen. escitalopram (LEXAPRO) 20 MG tablet Take 1 tablet (20 mg total) by mouth 3 (three) times daily. for mood control  . furosemide (LASIX) 20 MG tablet Take 1 tablet (20 mg total) by mouth daily.  Marland Kitchen. glipiZIDE (GLUCOTROL) 10 MG tablet Take 1 tablet (10 mg total) by mouth 2 (two) times daily before a meal.  . hydrOXYzine (ATARAX/VISTARIL) 50 MG tablet Take 1 tablet (50 mg total) by mouth every 6 (six) hours as needed for anxiety.  . Insulin Degludec (TRESIBA FLEXTOUCH) 200 UNIT/ML SOPN Inject 110 Units into the skin at bedtime.  Marland Kitchen. levothyroxine (SYNTHROID, LEVOTHROID) 50 MCG tablet Take 1 tablet (50 mcg total) by mouth daily before breakfast. For hypothyroidism  . metoprolol succinate (TOPROL XL) 25 MG 24 hr tablet Take 1 tablet (25 mg total) by mouth daily.  . nitroGLYCERIN (NITROSTAT) 0.4 MG SL tablet Place under the tongue.  . pantoprazole (PROTONIX) 40 MG tablet Take 1 tablet (40 mg total) by mouth daily.  . Potassium Chloride ER 20 MEQ TBCR Take 20 mEq by mouth daily.  . risperiDONE (RISPERDAL) 2 MG tablet Take 1 tablet (2 mg total) by mouth 2 (two) times daily.  . rizatriptan (MAXALT) 5 MG tablet Take by mouth.  . traZODone (DESYREL) 100 MG tablet Take 1 tablet (100 mg total) by mouth at bedtime.  . [DISCONTINUED] LEVEMIR 100 UNIT/ML injection INJECT 110 UNITS INTO THE SKIN AT BEDTIME.   No facility-administered encounter medications on file as of 05/29/2019.     ALLERGIES: Allergies  Allergen Reactions  . Amoxicillin Swelling and Other (See Comments)    Oral swelling   . Doxycycline Nausea And Vomiting  . Levofloxacin Other (See Comments)    headache  . Propoxyphene N-Acetaminophen Nausea And Vomiting    VACCINATION STATUS: Immunization History  Administered Date(s) Administered  .  Influenza,inj,Quad PF,6+ Mos 07/18/2018  . Pneumococcal Polysaccharide-23 07/18/2018    Diabetes She presents for her follow-up diabetic visit. She has type 2 diabetes mellitus. Onset time: She was diagnosed at approximate age of 39 years. Her disease course has been worsening. There are no hypoglycemic associated symptoms. Pertinent negatives for hypoglycemia include no confusion, headaches, pallor, seizures  or tremors. Associated symptoms include fatigue, polydipsia and polyuria. Pertinent negatives for diabetes include no chest pain and no polyphagia. There are no hypoglycemic complications. Symptoms are worsening. Diabetic complications include heart disease. (Status post triple coronary bypass in November 2019.) Risk factors for coronary artery disease include diabetes mellitus, dyslipidemia, family history, hypertension, obesity, sedentary lifestyle and tobacco exposure. Current diabetic treatment includes insulin injections and oral agent (monotherapy). She is following a generally unhealthy diet. When asked about meal planning, she reported none. She has not had a previous visit with a dietitian. She rarely participates in exercise. Her home blood glucose trend is fluctuating minimally. Her breakfast blood glucose range is generally >200 mg/dl. Her lunch blood glucose range is generally >200 mg/dl. Her dinner blood glucose range is generally >200 mg/dl. Her overall blood glucose range is >200 mg/dl. (She continues to report severe hyperglycemia averaging greater than 300 mg per DL.) An ACE inhibitor/angiotensin II receptor blocker is being taken. Eye exam is current.  Hypertension This is a chronic problem. The current episode started more than 1 year ago. The problem is controlled. Pertinent negatives include no chest pain, headaches, palpitations or shortness of breath. Risk factors for coronary artery disease include dyslipidemia, diabetes mellitus, obesity, sedentary lifestyle, smoking/tobacco  exposure and family history. Past treatments include calcium channel blockers. Hypertensive end-organ damage includes CAD/MI.  Hyperlipidemia This is a chronic problem. The current episode started more than 1 year ago. Exacerbating diseases include diabetes, hypothyroidism and obesity. Pertinent negatives include no chest pain, myalgias or shortness of breath. Risk factors for coronary artery disease include diabetes mellitus, dyslipidemia, family history, obesity, hypertension, a sedentary lifestyle and post-menopausal.     Objective:    There were no vitals taken for this visit.  Wt Readings from Last 3 Encounters:  02/06/19 (!) 319 lb (144.7 kg)  01/30/19 (!) 319 lb (144.7 kg)  09/13/18 (!) 320 lb 12.8 oz (145.5 kg)      CMP ( most recent) CMP     Component Value Date/Time   NA 143 05/21/2019 1309   K 4.4 05/21/2019 1309   CL 102 05/21/2019 1309   CO2 32 05/21/2019 1309   GLUCOSE 102 05/21/2019 1309   BUN 27 (H) 05/21/2019 1309   CREATININE 1.17 (H) 05/21/2019 1309   CALCIUM 9.6 05/21/2019 1309   PROT 6.9 05/21/2019 1309   ALBUMIN 3.0 (L) 07/17/2018 0514   AST 23 05/21/2019 1309   ALT 28 05/21/2019 1309   ALKPHOS 75 07/17/2018 0514   BILITOT 0.6 05/21/2019 1309   GFRNONAA 51 (L) 05/21/2019 1309   GFRAA 59 (L) 05/21/2019 1309     Diabetic Labs (most recent): Lab Results  Component Value Date   HGBA1C 9.6 (H) 05/21/2019   HGBA1C 9.7 (H) 01/30/2019   HGBA1C 10.7 (H) 07/17/2018     Lipid Panel ( most recent) Lipid Panel     Component Value Date/Time   CHOL 251 (H) 01/30/2019 0934   TRIG 243 (H) 01/30/2019 0934   HDL 47 (L) 01/30/2019 0934   CHOLHDL 5.3 (H) 01/30/2019 0934   VLDL UNABLE TO CALCULATE IF TRIGLYCERIDE OVER 400 mg/dL 45/40/981110/10/2008 [id number]   LDLCALC 163 (H) 01/30/2019 0934        Assessment & Plan:   1. DM type 2 causing vascular disease (HCC)  - Maria Scalesatricia G Runkle has currently uncontrolled symptomatic type 2 DM since 58 years of age. -She  reports persistent and severe hyperglycemia, recent A1c of 9.7%.  -her diabetes is complicated by  coronary artery disease status post triple bypass in November 2019, morbid obesity, history of smoking, sedentary life and she remains at a high risk for more acute and chronic complications which include CAD, CVA, CKD, retinopathy, and neuropathy. These are all discussed in detail with her.  - I have counseled her on diet management and weight loss, by adopting a carbohydrate restricted/protein rich diet.  - she  admits there is a room for improvement in her diet and drink choices. -  Suggestion is made for her to avoid simple carbohydrates  from her diet including Cakes, Sweet Desserts / Pastries, Ice Cream, Soda (diet and regular), Sweet Tea, Candies, Chips, Cookies, Sweet Pastries,  Store Bought Juices, Alcohol in Excess of  1-2 drinks a day, Artificial Sweeteners, Coffee Creamer, and "Sugar-free" Products. This will help patient to have stable blood glucose profile and potentially avoid unintended weight gain.  - I encouraged her to switch to  unprocessed or minimally processed complex starch and increased protein intake (animal or plant source), fruits, and vegetables.  - she is advised to stick to a routine mealtimes to eat 3 meals  a day and avoid unnecessary snacks ( to snack only to correct hypoglycemia).   - she will be scheduled with Norm Salt, RDN, CDE for individualized diabetes education.  - I have approached her with the following individualized plan to manage diabetes and patient agrees:   -Based on her current glycemic burden as well as her comorbidities, she will likely require intensive treatment with basal/bolus insulin in order for her to achieve control of diabetes to target.  She has questionable engagement for proper monitoring of blood glucose for safe use of insulin.  -She has room on her basal insulin, advised to increase her Levemir to 120 units nightly, continue to  monitor blood glucose 4 times a day -before meals and at bedtime.   - she is encouraged to call clinic for blood glucose levels less than 70 or above 200 mg /dl. - she is advised to continue glipizide 10 mg p.o. twice daily with breakfast and supper, -She did not tolerate the Metformin extended release, had to stop it.     - she will be considered for incretin therapy as appropriate next visit.  - Patient specific target  A1c;  LDL, HDL, Triglycerides, and  Waist Circumference were discussed in detail.  2) Blood Pressure /Hypertension: she is advised to home monitor blood pressure and report if > 140/90 on 2 separate readings.  she is advised to continue her current medications including metoprolol 25 mg p.o. daily, amlodipine 10mg  p.o. daily with breakfast .  She also has Lasix as needed.  She will be considered for low-dose ACE inhibitors on subsequent visits.  3) Lipids/Hyperlipidemia:   Review of her recent lipid panel showed uncontrolled triglyceridemia greater than 700.  She is advised to continue Lipitor 80 mg qhs.  -  Side effects and precautions discussed with her.  4)  Weight/Diet:  She is obese. -   clearly complicating her diabetes care.  I discussed with her the fact that loss of 5 - 10% of her  current body weight will have the most impact on her diabetes management.  CDE Consult will be initiated . Exercise, and detailed carbohydrates information provided  -  detailed on discharge instructions.  5) hypothyroidism: She is currently on levothyroxine 50 mcg p.o. every morning, her dose adjusted recently. Previsit thyroid function tests are consistent with appropriate replacement.  - We discussed about  the correct intake of her thyroid hormone, on empty stomach at fasting, with water, separated by at least 30 minutes from breakfast and other medications,  and separated by more than 4 hours from calcium, iron, multivitamins, acid reflux medications (PPIs). -Patient is made aware of  the fact that thyroid hormone replacement is needed for life, dose to be adjusted by periodic monitoring of thyroid function tests.  6) vitamin D  Deficiency -I discussed and initiated vitamin D 3 5000 units daily for the next 90 days.   7) Chronic Care/Health Maintenance:  -she  is on  Statin medications and  is encouraged to initiate and continue to follow up with Ophthalmology, Dentist,  Podiatrist at least yearly or according to recommendations, and advised to   stay away from smoking. I have recommended yearly flu vaccine and pneumonia vaccine at least every 5 years; moderate intensity exercise for up to 150 minutes weekly; and  sleep for at least 7 hours a day.  - she is  advised to maintain close follow up with Barbie Banner, MD for primary care needs, as well as her other providers for optimal and coordinated care.  - Patient Care Time Today:  25 min, of which >50% was spent in  counseling and the rest reviewing her  current and  previous labs/studies, previous treatments, her blood glucose readings, and medications' doses and developing a plan for long-term care based on the latest recommendations for standards of care.   [name] participated in the discussions, expressed understanding, and voiced agreement with the above plans.  All questions were answered to her satisfaction. she is encouraged to contact clinic should she have any questions or concerns prior to her return visit.   Follow up plan: - Return in about 2 weeks (around 06/12/2019) for Follow up with Meter and Logs Only - no Labs.  Marquis Lunch, MD [facility] [address] [place], [place] [address] Phone: [phone]  Fax: [phone]    05/29/2019, 7:03 PM  This note was partially dictated with voice recognition software. Similar sounding words can be transcribed inadequately or may not  be corrected upon review.

## 2019-06-12 ENCOUNTER — Ambulatory Visit (INDEPENDENT_AMBULATORY_CARE_PROVIDER_SITE_OTHER): Payer: Medicare HMO | Admitting: Psychiatry

## 2019-06-12 ENCOUNTER — Other Ambulatory Visit: Payer: Self-pay

## 2019-06-12 ENCOUNTER — Encounter (HOSPITAL_COMMUNITY): Payer: Self-pay | Admitting: Psychiatry

## 2019-06-12 DIAGNOSIS — F3130 Bipolar disorder, current episode depressed, mild or moderate severity, unspecified: Secondary | ICD-10-CM

## 2019-06-12 MED ORDER — HYDROXYZINE HCL 50 MG PO TABS: 50.0000 mg | ORAL_TABLET | Freq: Four times a day (QID) | ORAL | 2 refills | Status: DC | PRN

## 2019-06-12 MED ORDER — TRAZODONE HCL 100 MG PO TABS: 100.0000 mg | ORAL_TABLET | Freq: Every day | ORAL | 2 refills | Status: DC

## 2019-06-12 MED ORDER — BUSPIRONE HCL 30 MG PO TABS: 30.0000 mg | ORAL_TABLET | Freq: Three times a day (TID) | ORAL | 2 refills | Status: DC

## 2019-06-12 MED ORDER — RISPERIDONE 2 MG PO TABS: 2.0000 mg | ORAL_TABLET | Freq: Two times a day (BID) | ORAL | 2 refills | Status: DC

## 2019-06-12 MED ORDER — ESCITALOPRAM OXALATE 20 MG PO TABS: 20.0000 mg | ORAL_TABLET | Freq: Three times a day (TID) | ORAL | 2 refills | Status: DC

## 2019-06-12 NOTE — Progress Notes (Signed)
Virtual Visit via Telephone Note  I connected with [name] on 06/12/19 at 11:20 AM EDT by telephone and verified that I am speaking with the correct person using two identifiers.   I discussed the limitations, risks, security and privacy concerns of performing an evaluation and management service by telephone and the availability of in person appointments. I also discussed with the patient that there may be a patient responsible charge related to this service. The patient expressed understanding and agreed to proceed.     I discussed the assessment and treatment plan with the patient. The patient was provided an opportunity to ask questions and all were answered. The patient agreed with the plan and demonstrated an understanding of the instructions.   The patient was advised to call back or seek an in-person evaluation if the symptoms worsen or if the condition fails to improve as anticipated.  I provided 15 minutes of non-face-to-face time during this encounter.   [name], MD  [facility] MD/PA/NP OP Progress Note  06/12/2019 11:19 AM [name]  MRN:  [id number]  Chief Complaint:  Chief Complaint    Depression; Anxiety; Follow-up; Manic Behavior     HPI: This patient is a 57 year oldwidowed white female who lives alonein [place]. She has 2 grown daughters and one 20 year old grandson. She is on disability for both back pain and bipolar disorder.  The patient was referred by Dr. Lolly Mustache from our [facility] clinic. The patient would like to start coming here as it is too far for her to drive to the other clinic.  The patient states that she has had problems with mood since her mid 14s. She began losing her temper having severe outburst mood swings and lashing out at people. 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 She began being paranoid and accusing people of doing things behind her back or talking about her.  She was hospitalized 4 times the last time being at the behavioral [facility] hospital in 2011. At that time she was very depressed hallucinating and hearing command hallucinations to cut herself or kill herself. She was Stabilized on a combination of lithium Wellbutrin and Risperdal.She had seen Dr. Lolly Mustache for a while and had been transferred to me after it became too far to drive to the [facility] clinic  The patient returns after 2 months.  Last time she stated she was more depressed and feeling isolated to the coronavirus pandemic.  I did increase her Celexa to 60 mg daily and it seems to have helped.  She states that she is feeling pretty good now.  She is getting out and walking when she can.  She spent some time with her daughters and is still going to church twice a week.  She states that she is sleeping well and her anxiety is under good control.  She denies any paranoid symptoms or delusions or auditory visual hallucinations or suicidal ideation Visit Diagnosis:    ICD-10-CM   1. Bipolar I disorder, most recent episode depressed (HCC)  F31.30     Past Psychiatric History: For previous hospitalizations for bipolar disorder, long-term outpatient treatment  Past Medical History:  Past Medical History:  Diagnosis Date  . Asthma   . Back pain   . CAD (coronary artery disease)    a. 06/2018: s/p cath showing 3-vessel CAD --> s/p 4-vessel CABG on 07/28/2018 with SVG-PDA, SVG-RI, seq LIMA-mid-LAD-distal LAD (had a false negative stress test two  weeks prior).   . Chest pain   . Diabetes mellitus type II   . Headache   . High cholesterol   . Hypertension   . Obesity, morbid ([place])   . Other and unspecified bipolar disorders   . 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 Thyroid disease     Past Surgical History:  Procedure Laterality Date  . TUBAL LIGATION     Bilateral    Family Psychiatric History: see below  Family History:  Family History  Problem Relation Age of Onset  . Heart attack  Maternal Uncle   . Depression Maternal Uncle   . Depression Mother   . Alcohol abuse Maternal Grandfather   . Alcohol abuse Maternal Uncle   . Depression Maternal Grandmother     Social History:  Social History   Socioeconomic History  . Marital status: Widowed    Spouse name: Not on file  . Number of children: Not on file  . Years of education: Not on file  . Highest education level: Not on file  Occupational History  . Occupation: Disabled    Fish farm manager: UNEMPLOYED  Social Needs  . Financial resource strain: Not on file  . Food insecurity    Worry: Not on file    Inability: Not on file  . Transportation needs    Medical: Not on file    Non-medical: Not on file  Tobacco Use  . Smoking status: Never Smoker  . Smokeless tobacco: Never Used  Substance and Sexual Activity  . Alcohol use: No    Alcohol/week: 0.0 standard drinks  . Drug use: No    Comment: 05/13/2016 per pt no  . Sexual activity: Yes    Birth control/protection: Surgical  Lifestyle  . Physical activity    Days per week: Not on file    Minutes per session: Not on file  . Stress: Not on file  Relationships  . Social Herbalist on phone: Not on file    Gets together: Not on file    Attends religious service: Not on file    Active member of club or organization: Not on file    Attends meetings of clubs or organizations: Not on file    Relationship status: Not on file  Other Topics Concern  . Not on file  Social History Narrative   Married   No regular exercise    Allergies:  Allergies  Allergen Reactions  . Amoxicillin Swelling and Other (See Comments)    Oral swelling   . Doxycycline Nausea And Vomiting  . Levofloxacin Other (See Comments)    headache  . Propoxyphene N-Acetaminophen Nausea And Vomiting    Metabolic Disorder Labs: Lab Results  Component Value Date   HGBA1C 9.6 (H) 05/21/2019   MPG 229 05/21/2019   MPG 232 01/30/2019   No results found for: PROLACTIN Lab  Results  Component Value Date   CHOL 251 (H) 01/30/2019   TRIG 243 (H) 01/30/2019   HDL 47 (L) 01/30/2019   CHOLHDL 5.3 (H) 01/30/2019   VLDL UNABLE TO CALCULATE IF TRIGLYCERIDE OVER 400 mg/dL 06/28/2009   LDLCALC 163 (H) 01/30/2019   LDLCALC  06/28/2009    UNABLE TO CALCULATE IF TRIGLYCERIDE OVER 400 mg/dL        Total Cholesterol/HDL:CHD Risk Coronary Heart Disease Risk Table                     Men   Women  1/2 Average Risk  3.4   3.3  Average Risk       5.0   4.4  2 X Average Risk   9.6   7.1  3 X Average Risk  23.4   11.0        Use the calculated Patient Ratio above and the CHD Risk Table to determine the patient's CHD Risk.        ATP III CLASSIFICATION (LDL):  <100     mg/dL   Optimal  604-540  mg/dL   Near or Above                    Optimal  130-159  mg/dL   Borderline  981-191  mg/dL   High  >478     mg/dL   Very High   Lab Results  Component Value Date   TSH 2.07 01/30/2019   TSH 4.88 (H) 02/03/2016    Therapeutic Level Labs: Lab Results  Component Value Date   LITHIUM 0.6 (L) 07/07/2016   LITHIUM 0.6 (L) 03/10/2016   No results found for: VALPROATE No components found for:  CBMZ  Current Medications: Current Outpatient Medications  Medication Sig Dispense Refill  . albuterol (PROVENTIL HFA;VENTOLIN HFA) 108 (90 Base) MCG/ACT inhaler Inhale 2 puffs into the lungs every 6 (six) hours as needed for wheezing or shortness of breath. 1 Inhaler 0  . amLODipine (NORVASC) 10 MG tablet Take 1 tablet (10 mg total) by mouth daily. For high blood pressure 30 tablet 0  . atorvastatin (LIPITOR) 80 MG tablet Take 1 tablet (80 mg total) by mouth every morning. (Patient taking differently: Take 80 mg by mouth daily at 6 PM. ) 30 tablet 0  . busPIRone (BUSPAR) 30 MG tablet Take 1 tablet (30 mg total) by mouth 3 (three) times daily. 270 tablet 2  . Cholecalciferol (VITAMIN D3) 125 MCG (5000 UT) CAPS Take 1 capsule (5,000 Units total) by mouth daily. 90 capsule 0  .  clopidogrel (PLAVIX) 75 MG tablet Take 1 tablet (75 mg total) by mouth daily. 30 tablet 0  . escitalopram (LEXAPRO) 20 MG tablet Take 1 tablet (20 mg total) by mouth 3 (three) times daily. for mood control 270 tablet 2  . furosemide (LASIX) 20 MG tablet Take 1 tablet (20 mg total) by mouth daily. 90 tablet 3  . glipiZIDE (GLUCOTROL) 10 MG tablet Take 1 tablet (10 mg total) by mouth 2 (two) times daily before a meal. 60 tablet 0  . hydrOXYzine (ATARAX/VISTARIL) 50 MG tablet Take 1 tablet (50 mg total) by mouth every 6 (six) hours as needed for anxiety. 90 tablet 2  . Insulin Degludec (TRESIBA FLEXTOUCH) 200 UNIT/ML SOPN Inject 110 Units into the skin at bedtime. 6 mL 0  . levothyroxine (SYNTHROID, LEVOTHROID) 50 MCG tablet Take 1 tablet (50 mcg total) by mouth daily before breakfast. For hypothyroidism 30 tablet 0  . metoprolol succinate (TOPROL XL) 25 MG 24 hr tablet Take 1 tablet (25 mg total) by mouth daily. 90 tablet 3  . nitroGLYCERIN (NITROSTAT) 0.4 MG SL tablet Place under the tongue.    . pantoprazole (PROTONIX) 40 MG tablet Take 1 tablet (40 mg total) by mouth daily. 30 tablet 1  . Potassium Chloride ER 20 MEQ TBCR Take 20 mEq by mouth daily. 90 tablet 3  . risperiDONE (RISPERDAL) 2 MG tablet Take 1 tablet (2 mg total) by mouth 2 (two) times daily. 180 tablet 2  . rizatriptan (MAXALT) 5 MG tablet Take by mouth.    Marland Kitchen  traZODone (DESYREL) 100 MG tablet Take 1 tablet (100 mg total) by mouth at bedtime. 90 tablet 2   No current facility-administered medications for this visit.      Musculoskeletal: Strength & Muscle Tone: decreased Gait & Station: normal Patient leans: N/A  Psychiatric Specialty Exam: Review of Systems  Musculoskeletal: Positive for back pain and joint pain.  All other systems reviewed and are negative.   There were no vitals taken for this visit.There is no height or weight on file to calculate BMI.  General Appearance: NA  Eye Contact:  NA  Speech:  Clear and  Coherent  Volume:  Normal  Mood:  Euthymic  Affect:  NA  Thought Process:  Goal Directed  Orientation:  Full (Time, Place, and Person)  Thought Content: WDL   Suicidal Thoughts:  No  Homicidal Thoughts:  No  Memory:  Immediate;   Good Recent;   Good Remote;   Fair  Judgement:  Good  Insight:  Fair  Psychomotor Activity:  Decreased  Concentration:  Concentration: Good and Attention Span: Good  Recall:  Good  Fund of Knowledge: Fair  Language: Good  Akathisia:  No  Handed:  Right  AIMS (if indicated): not done  Assets:  Communication Skills Desire for Improvement Resilience Social Support Talents/Skills  ADL's:  Intact  Cognition: WNL  Sleep:  Good   Screenings: AIMS     [facility]  AIMS Total Score  0    AUDIT     Ad[facility] ([facility]  Alcohol Use Disorder Identification Test Final Score (AUDIT)  0    MDI     [facility]  Total Score (max 50)  25    PHQ2-9     Nutrition from 02/06/2019 in [facility] Counselor from 06/21/2016 [facility]  PHQ-2 Total Score  0  4  PHQ-9 Total Score  -  15       Assessment and Plan: This patient is a 58 year old female with a history of significant heart disease, type 2 diabetes and bipolar disorder.  She is doing better on her current regimen.  She will continue Risperdal 2 mg twice daily for mood stabilization, trazodone 100 mg at bedtime for sleep, BuSpar 30 mg 3 times daily for anxiety as well as hydroxyzine 50 mg every 6 hours as needed for anxiety and Lexapro 60 mg daily for depression.  She will return to see me in 2 months   Diannia Rudereborah , MD 06/12/2019, 11:19 AM

## 2019-06-13 ENCOUNTER — Encounter: Payer: Self-pay | Admitting: "Endocrinology

## 2019-06-13 ENCOUNTER — Ambulatory Visit (INDEPENDENT_AMBULATORY_CARE_PROVIDER_SITE_OTHER): Payer: Medicare HMO | Admitting: "Endocrinology

## 2019-06-13 DIAGNOSIS — E1159 Type 2 diabetes mellitus with other circulatory complications: Secondary | ICD-10-CM

## 2019-06-13 DIAGNOSIS — E782 Mixed hyperlipidemia: Secondary | ICD-10-CM

## 2019-06-13 DIAGNOSIS — E039 Hypothyroidism, unspecified: Secondary | ICD-10-CM

## 2019-06-13 DIAGNOSIS — E559 Vitamin D deficiency, unspecified: Secondary | ICD-10-CM

## 2019-06-13 DIAGNOSIS — I10 Essential (primary) hypertension: Secondary | ICD-10-CM | POA: Diagnosis not present

## 2019-06-13 MED ORDER — NOVOLIN 70/30 FLEXPEN (70-30) 100 UNIT/ML ~~LOC~~ SUPN: 70.0000 [IU] | PEN_INJECTOR | Freq: Two times a day (BID) | SUBCUTANEOUS | 2 refills | Status: DC

## 2019-06-13 NOTE — Progress Notes (Signed)
06/13/2019                                                     Endocrinology Telehealth Visit Follow up Note -During COVID -19 Pandemic  This visit type was conducted due to national recommendations for restrictions regarding the COVID-19 Pandemic  in an effort to limit this patient's exposure and mitigate transmission of the corona virus.  Due to her co-morbid illnesses, GWENEVERE GOGA is at  moderate to high risk for complications without adequate follow up.  This format is felt to be most appropriate for her at this time.  I connected with this patient on 06/13/2019   by telephone and verified that I am speaking with the correct person using two identifiers. Heide Scales, [birth year]. she has verbally consented to this visit. All issues noted in this document were discussed and addressed. The format was not optimal for physical exam.    Subjective:    Patient ID: SHONICA WEIER, female    DOB: Nov 04, 1960.  AUBREI BOUCHIE is being engaged in telehealth via telephone in follow-up  for management of currently uncontrolled symptomatic diabetes requested by  Barbie Banner, MD.   Past Medical History:  Diagnosis Date  . Asthma   . Back pain   . CAD (coronary artery disease)    a. 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   . Chest pain   . Diabetes mellitus type II   . Headache   . High cholesterol   . Hypertension   . Obesity, morbid (HCC)   . Other and unspecified bipolar disorders   . 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 Thyroid disease     Past Surgical History:  Procedure Laterality Date  . TUBAL LIGATION     Bilateral    Social History   Socioeconomic History  . Marital status: Widowed    Spouse name: Not on file  . Number of children: Not on file  . Years of education: Not on file  . Highest education level: Not  on file  Occupational History  . Occupation: Disabled    Associate Professor: UNEMPLOYED  Social Needs  . Financial resource strain: Not on file  . Food insecurity    Worry: Not on file    Inability: Not on file  . Transportation needs    Medical: Not on file    Non-medical: Not on file  Tobacco Use  . Smoking status: Never Smoker  . Smokeless tobacco: Never Used  Substance and Sexual Activity  . Alcohol use: No    Alcohol/week: 0.0 standard drinks  . Drug use: No    Comment: 05/13/2016 per pt no  . Sexual activity: Yes    Birth control/protection: Surgical  Lifestyle  . Physical activity    Days per week: Not on file    Minutes per session: Not on file  . Stress: Not on file  Relationships  . Social Musician on phone: Not on file    Gets together: Not on file    Attends religious service: Not on file    Active member of club or organization: Not on file    Attends meetings of clubs or organizations: Not  on file    Relationship status: Not on file  Other Topics Concern  . Not on file  Social History Narrative   Married   No regular exercise    Family History  Problem Relation Age of Onset  . Heart attack Maternal Uncle   . Depression Maternal Uncle   . Depression Mother   . Alcohol abuse Maternal Grandfather   . Alcohol abuse Maternal Uncle   . Depression Maternal Grandmother     Outpatient Encounter Medications as of 06/13/2019  Medication Sig  . albuterol (PROVENTIL HFA;VENTOLIN HFA) 108 (90 Base) MCG/ACT inhaler Inhale 2 puffs into the lungs every 6 (six) hours as needed for wheezing or shortness of breath.  Marland Kitchen amLODipine (NORVASC) 10 MG tablet Take 1 tablet (10 mg total) by mouth daily. For high blood pressure  . atorvastatin (LIPITOR) 80 MG tablet Take 1 tablet (80 mg total) by mouth every morning. (Patient taking differently: Take 80 mg by mouth daily at 6 PM. )  . busPIRone (BUSPAR) 30 MG tablet Take 1 tablet (30 mg total) by mouth 3 (three) times daily.   . Cholecalciferol (VITAMIN D3) 125 MCG (5000 UT) CAPS Take 1 capsule (5,000 Units total) by mouth daily.  . clopidogrel (PLAVIX) 75 MG tablet Take 1 tablet (75 mg total) by mouth daily.  Marland Kitchen escitalopram (LEXAPRO) 20 MG tablet Take 1 tablet (20 mg total) by mouth 3 (three) times daily. for mood control  . furosemide (LASIX) 20 MG tablet Take 1 tablet (20 mg total) by mouth daily.  Marland Kitchen glipiZIDE (GLUCOTROL) 10 MG tablet Take 1 tablet (10 mg total) by mouth 2 (two) times daily before a meal.  . hydrOXYzine (ATARAX/VISTARIL) 50 MG tablet Take 1 tablet (50 mg total) by mouth every 6 (six) hours as needed for anxiety.  . Insulin Isophane & Regular Human (NOVOLIN 70/30 FLEXPEN) (70-30) 100 UNIT/ML PEN Inject 70 Units into the skin 2 (two) times daily with a meal.  . levothyroxine (SYNTHROID, LEVOTHROID) 50 MCG tablet Take 1 tablet (50 mcg total) by mouth daily before breakfast. For hypothyroidism  . metoprolol succinate (TOPROL XL) 25 MG 24 hr tablet Take 1 tablet (25 mg total) by mouth daily.  . nitroGLYCERIN (NITROSTAT) 0.4 MG SL tablet Place under the tongue.  . pantoprazole (PROTONIX) 40 MG tablet Take 1 tablet (40 mg total) by mouth daily.  . Potassium Chloride ER 20 MEQ TBCR Take 20 mEq by mouth daily.  . risperiDONE (RISPERDAL) 2 MG tablet Take 1 tablet (2 mg total) by mouth 2 (two) times daily.  . rizatriptan (MAXALT) 5 MG tablet Take by mouth.  . traZODone (DESYREL) 100 MG tablet Take 1 tablet (100 mg total) by mouth at bedtime.  . [DISCONTINUED] Insulin Degludec (TRESIBA FLEXTOUCH) 200 UNIT/ML SOPN Inject 110 Units into the skin at bedtime.   No facility-administered encounter medications on file as of 06/13/2019.     ALLERGIES: Allergies  Allergen Reactions  . Amoxicillin Swelling and Other (See Comments)    Oral swelling   . Doxycycline Nausea And Vomiting  . Levofloxacin Other (See Comments)    headache  . Propoxyphene N-Acetaminophen Nausea And Vomiting    VACCINATION  STATUS: Immunization History  Administered Date(s) Administered  . Influenza,inj,Quad PF,6+ Mos 07/18/2018  . Pneumococcal Polysaccharide-23 07/18/2018    Diabetes She presents for her follow-up diabetic visit. She has type 2 diabetes mellitus. Onset time: She was diagnosed at approximate age of 39 years. Her disease course has been worsening. There are no  hypoglycemic associated symptoms. Pertinent negatives for hypoglycemia include no confusion, headaches, pallor, seizures or tremors. Associated symptoms include fatigue, polydipsia and polyuria. Pertinent negatives for diabetes include no chest pain and no polyphagia. There are no hypoglycemic complications. Symptoms are worsening. Diabetic complications include heart disease. (Status post triple coronary bypass in November 2019.) Risk factors for coronary artery disease include diabetes mellitus, dyslipidemia, family history, hypertension, obesity, sedentary lifestyle and tobacco exposure. Current diabetic treatment includes insulin injections and oral agent (monotherapy). She is following a generally unhealthy diet. When asked about meal planning, she reported none. She has not had a previous visit with a dietitian. She rarely participates in exercise. Her home blood glucose trend is fluctuating minimally. Her breakfast blood glucose range is generally >200 mg/dl. Her lunch blood glucose range is generally >200 mg/dl. Her dinner blood glucose range is generally >200 mg/dl. Her overall blood glucose range is >200 mg/dl. (She continues to report severe hyperglycemia averaging 200-250 milligrams per DL.    ) An ACE inhibitor/angiotensin II receptor blocker is being taken. Eye exam is current.  Hypertension This is a chronic problem. The current episode started more than 1 year ago. The problem is controlled. Pertinent negatives include no chest pain, headaches, palpitations or shortness of breath. Risk factors for coronary artery disease include  dyslipidemia, diabetes mellitus, obesity, sedentary lifestyle, smoking/tobacco exposure and family history. Past treatments include calcium channel blockers. Hypertensive end-organ damage includes CAD/MI.  Hyperlipidemia This is a chronic problem. The current episode started more than 1 year ago. Exacerbating diseases include diabetes, hypothyroidism and obesity. Pertinent negatives include no chest pain, myalgias or shortness of breath. Risk factors for coronary artery disease include diabetes mellitus, dyslipidemia, family history, obesity, hypertension, a sedentary lifestyle and post-menopausal.     Objective:    There were no vitals taken for this visit.  Wt Readings from Last 3 Encounters:  02/06/19 (!) 319 lb (144.7 kg)  01/30/19 (!) 319 lb (144.7 kg)  09/13/18 (!) 320 lb 12.8 oz (145.5 kg)      CMP ( most recent) CMP     Component Value Date/Time   NA 143 05/21/2019 1309   K 4.4 05/21/2019 1309   CL 102 05/21/2019 1309   CO2 32 05/21/2019 1309   GLUCOSE 102 05/21/2019 1309   BUN 27 (H) 05/21/2019 1309   CREATININE 1.17 (H) 05/21/2019 1309   CALCIUM 9.6 05/21/2019 1309   PROT 6.9 05/21/2019 1309   ALBUMIN 3.0 (L) 07/17/2018 0514   AST 23 05/21/2019 1309   ALT 28 05/21/2019 1309   ALKPHOS 75 07/17/2018 0514   BILITOT 0.6 05/21/2019 1309   GFRNONAA 51 (L) 05/21/2019 1309   GFRAA 59 (L) 05/21/2019 1309     Diabetic Labs (most recent): Lab Results  Component Value Date   HGBA1C 9.6 (H) 05/21/2019   HGBA1C 9.7 (H) 01/30/2019   HGBA1C 10.7 (H) 07/17/2018     Lipid Panel ( most recent) Lipid Panel     Component Value Date/Time   CHOL 251 (H) 01/30/2019 0934   TRIG 243 (H) 01/30/2019 0934   HDL 47 (L) 01/30/2019 0934   CHOLHDL 5.3 (H) 01/30/2019 0934   VLDL UNABLE TO CALCULATE IF TRIGLYCERIDE OVER 400 mg/dL 06/28/2009 0611   LDLCALC 163 (H) 01/30/2019 0934        Assessment & Plan:   1. DM type 2 causing vascular disease ([place])  - LAKYLA BISWAS has  currently uncontrolled symptomatic type 2 DM since 58 years of age. -She  reports persistent and severe hyperglycemia, slightly better than last visit, recent A1c of 9.7%.  -her diabetes is complicated by coronary artery disease status post triple bypass in November 2019, morbid obesity, history of smoking, sedentary life and she remains at a high risk for more acute and chronic complications which include CAD, CVA, CKD, retinopathy, and neuropathy. These are all discussed in detail with her.  - I have counseled her on diet management and weight loss, by adopting a carbohydrate restricted/protein rich diet.  - she  admits there is a room for improvement in her diet and drink choices. -  Suggestion is made for her to avoid simple carbohydrates  from her diet including Cakes, Sweet Desserts / Pastries, Ice Cream, Soda (diet and regular), Sweet Tea, Candies, Chips, Cookies, Sweet Pastries,  Store Bought Juices, Alcohol in Excess of  1-2 drinks a day, Artificial Sweeteners, Coffee Creamer, and "Sugar-free" Products. This will help patient to have stable blood glucose profile and potentially avoid unintended weight gain.   - I encouraged her to switch to  unprocessed or minimally processed complex starch and increased protein intake (animal or plant source), fruits, and vegetables.  - she is advised to stick to a routine mealtimes to eat 3 meals  a day and avoid unnecessary snacks ( to snack only to correct hypoglycemia).   - she will be scheduled with Norm SaltPenny Crumpton, RDN, CDE for individualized diabetes education.  - I have approached her with the following individualized plan to manage diabetes and patient agrees:   -Based on her current glycemic burden as well as her comorbidities, she will likely require intensive treatment with basal/bolus insulin in order for her to achieve control of diabetes to target.  She has questionable engagement for proper monitoring of blood glucose for safe use of  insulin. -She expressed her concern about cost of insulin this morning and has not been feeling her Levemir prescription. -She would benefit from a cheaper option.  I discussed and switched her insulin to Novolin 70/30 to take 70 units with breakfast and 70 units with supper for pre-meal blood glucose readings above 90 mg /dl. -She is advised to continue monitoring blood glucose 4 times a day- -before meals and at bedtime.   - she is encouraged to call clinic for blood glucose levels less than 70 or above 200 mg /dl. - she is advised to continue glipizide 10 mg p.o. twice daily with breakfast and supper, -She did not tolerate the Metformin extended release, had to stop it.     - she will be considered for incretin therapy as appropriate next visit.  - Patient specific target  A1c;  LDL, HDL, Triglycerides, and  Waist Circumference were discussed in detail.  2) Blood Pressure /Hypertension: she is advised to home monitor blood pressure and report if > 140/90 on 2 separate readings.  she is advised to continue her current medications including metoprolol 25 mg p.o. daily, amlodipine 10mg  p.o. daily with breakfast .  She also has Lasix as needed.  She will be considered for low-dose ACE inhibitors on subsequent visits.  3) Lipids/Hyperlipidemia:   Review of her recent lipid panel showed uncontrolled triglyceridemia greater than 700.  She is advised to continue Lipitor 80 mg p.o. daily at bedtime.  -  Side effects and precautions discussed with her.  4)  Weight/Diet:  She is obese. -   clearly complicating her diabetes care.  I discussed with her the fact that loss of 5 - 10% of  her  current body weight will have the most impact on her diabetes management.  CDE Consult will be initiated . Exercise, and detailed carbohydrates information provided  -  detailed on discharge instructions.  5) hypothyroidism: She is currently on levothyroxine 50 mcg p.o. every morning, her dose adjusted  recently. Previsit thyroid function tests are consistent with appropriate replacement.  - We discussed about the correct intake of her thyroid hormone, on empty stomach at fasting, with water, separated by at least 30 minutes from breakfast and other medications,  and separated by more than 4 hours from calcium, iron, multivitamins, acid reflux medications (PPIs). -Patient is made aware of the fact that thyroid hormone replacement is needed for life, dose to be adjusted by periodic monitoring of thyroid function tests.   6) vitamin D  Deficiency -I discussed and initiated vitamin D 3 5000 units daily for the next 90 days.   7) Chronic Care/Health Maintenance:  -she  is on  Statin medications and  is encouraged to initiate and continue to follow up with Ophthalmology, Dentist,  Podiatrist at least yearly or according to recommendations, and advised to   stay away from smoking. I have recommended yearly flu vaccine and pneumonia vaccine at least every 5 years; moderate intensity exercise for up to 150 minutes weekly; and  sleep for at least 7 hours a day.  - she is  advised to maintain close follow up with Barbie BannerWilson, Fred H, MD for primary care needs, as well as her other providers for optimal and coordinated care.  - Patient Care Time Today:  25 min, of which >50% was spent in  counseling and the rest reviewing her  current and  previous labs/studies, previous treatments, her blood glucose readings, and medications' doses and developing a plan for long-term care based on the latest recommendations for standards of care.   Heide ScalesPatricia G Krull participated in the discussions, expressed understanding, and voiced agreement with the above plans.  All questions were answered to her satisfaction. she is encouraged to contact clinic should she have any questions or concerns prior to her return visit.  Follow up plan: - Return in about 4 weeks (around 07/11/2019) for Follow up with [facility] Only - no  Labs.  Marquis LunchGebre Daina Cara, MD [facility] [address] [place], [place] [address] Phone: [phone]  Fax: [phone]    06/13/2019, 1:57 PM  This note was partially dictated with voice recognition software. Similar sounding words can be transcribed inadequately or may not  be corrected upon review.

## 2019-06-14 ENCOUNTER — Ambulatory Visit: Payer: Medicare HMO | Admitting: "Endocrinology

## 2019-07-12 ENCOUNTER — Ambulatory Visit (INDEPENDENT_AMBULATORY_CARE_PROVIDER_SITE_OTHER): Payer: Medicare HMO | Admitting: "Endocrinology

## 2019-07-12 ENCOUNTER — Encounter: Payer: Self-pay | Admitting: "Endocrinology

## 2019-07-12 ENCOUNTER — Other Ambulatory Visit: Payer: Self-pay

## 2019-07-12 DIAGNOSIS — E1159 Type 2 diabetes mellitus with other circulatory complications: Secondary | ICD-10-CM

## 2019-07-12 DIAGNOSIS — E782 Mixed hyperlipidemia: Secondary | ICD-10-CM

## 2019-07-12 DIAGNOSIS — E559 Vitamin D deficiency, unspecified: Secondary | ICD-10-CM

## 2019-07-12 DIAGNOSIS — I10 Essential (primary) hypertension: Secondary | ICD-10-CM | POA: Diagnosis not present

## 2019-07-12 MED ORDER — NOVOLIN 70/30 FLEXPEN (70-30) 100 UNIT/ML ~~LOC~~ SUPN: 70.0000 [IU] | PEN_INJECTOR | Freq: Two times a day (BID) | SUBCUTANEOUS | 2 refills | Status: DC

## 2019-07-12 NOTE — Progress Notes (Signed)
07/12/2019                                                     Endocrinology Telehealth Visit Follow up Note -During COVID -19 Pandemic  This visit type was conducted due to national recommendations for restrictions regarding the COVID-19 Pandemic  in an effort to limit this patient's exposure and mitigate transmission of the corona virus.  Due to her co-morbid illnesses, [name] is at  moderate to high risk for complications without adequate follow up.  This format is felt to be most appropriate for her at this time.  I connected with this patient on 07/12/2019   by telephone and verified that I am speaking with the correct person using two identifiers. [name], 59/25/1962. she has verbally consented to this visit. All issues noted in this document were discussed and addressed. The format was not optimal for physical exam.    Subjective:    Patient ID: [name], female    DOB: 04/26/1961.  [name] is being engaged in telehealth via telephone in follow-up  for management of currently uncontrolled symptomatic diabetes requested by  Barbie BannerWilson, Fred H, MD.   Past Medical History:  Diagnosis Date  . Asthma   . Back pain   . CAD (coronary artery disease)    a. 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   . Chest pain   . Diabetes mellitus type II   . Headache   . High cholesterol   . Hypertension   . Obesity, morbid ([facility])   . Other and unspecified bipolar disorders   . 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 Thyroid disease     Past Surgical History:  Procedure Laterality Date  . TUBAL LIGATION     Bilateral    Social History   Socioeconomic History  . Marital status: Widowed    Spouse name: Not on file  . Number of children: Not on file  . Years of education: Not on file  . Highest education level:  Not on file  Occupational History  . Occupation: Disabled    Associate Professormployer: UNEMPLOYED  Social Needs  . Financial resource strain: Not on file  . Food insecurity    Worry: Not on file    Inability: Not on file  . Transportation needs    Medical: Not on file    Non-medical: Not on file  Tobacco Use  . Smoking status: Never Smoker  . Smokeless tobacco: Never Used  Substance and Sexual Activity  . Alcohol use: No    Alcohol/week: 0.0 standard drinks  . Drug use: No    Comment: 05/13/2016 per pt no  . Sexual activity: Yes    Birth control/protection: Surgical  Lifestyle  . Physical activity    Days per week: Not on file    Minutes per session: Not on file  . Stress: Not on file  Relationships  . Social Musicianconnections    Talks on phone: Not on file    Gets together: Not on file    Attends religious service: Not on file    Active member of club or organization: Not on file    Attends meetings of clubs or organizations: Not  on file    Relationship status: Not on file  Other Topics Concern  . Not on file  Social History Narrative   Married   No regular exercise    Family History  Problem Relation Age of Onset  . Heart attack Maternal Uncle   . Depression Maternal Uncle   . Depression Mother   . Alcohol abuse Maternal Grandfather   . Alcohol abuse Maternal Uncle   . Depression Maternal Grandmother     Outpatient Encounter Medications as of 07/12/2019  Medication Sig  . albuterol (PROVENTIL HFA;VENTOLIN HFA) 108 (90 Base) MCG/ACT inhaler Inhale 2 puffs into the lungs every 6 (six) hours as needed for wheezing or shortness of breath.  Marland Kitchen. amLODipine (NORVASC) 10 MG tablet Take 1 tablet (10 mg total) by mouth daily. For high blood pressure  . atorvastatin (LIPITOR) 80 MG tablet Take 1 tablet (80 mg total) by mouth every morning. (Patient taking differently: Take 80 mg by mouth daily at 6 PM. )  . busPIRone (BUSPAR) 30 MG tablet Take 1 tablet (30 mg total) by mouth 3 (three) times  daily.  . Cholecalciferol (VITAMIN D3) 125 MCG (5000 UT) CAPS Take 1 capsule (5,000 Units total) by mouth daily.  . clopidogrel (PLAVIX) 75 MG tablet Take 1 tablet (75 mg total) by mouth daily.  Marland Kitchen. escitalopram (LEXAPRO) 20 MG tablet Take 1 tablet (20 mg total) by mouth 3 (three) times daily. for mood control  . furosemide (LASIX) 20 MG tablet Take 1 tablet (20 mg total) by mouth daily.  Marland Kitchen. glipiZIDE (GLUCOTROL) 10 MG tablet Take 1 tablet (10 mg total) by mouth 2 (two) times daily before a meal.  . hydrOXYzine (ATARAX/VISTARIL) 50 MG tablet Take 1 tablet (50 mg total) by mouth every 6 (six) hours as needed for anxiety.  . Insulin Isophane & Regular Human (NOVOLIN 70/30 FLEXPEN) (70-30) 100 UNIT/ML PEN Inject 70-80 Units into the skin 2 (two) times daily with a meal.  . levothyroxine (SYNTHROID, LEVOTHROID) 50 MCG tablet Take 1 tablet (50 mcg total) by mouth daily before breakfast. For hypothyroidism  . metoprolol succinate (TOPROL XL) 25 MG 24 hr tablet Take 1 tablet (25 mg total) by mouth daily.  . nitroGLYCERIN (NITROSTAT) 0.4 MG SL tablet Place under the tongue.  . pantoprazole (PROTONIX) 40 MG tablet Take 1 tablet (40 mg total) by mouth daily.  . Potassium Chloride ER 20 MEQ TBCR Take 20 mEq by mouth daily.  . risperiDONE (RISPERDAL) 2 MG tablet Take 1 tablet (2 mg total) by mouth 2 (two) times daily.  . rizatriptan (MAXALT) 5 MG tablet Take by mouth.  . traZODone (DESYREL) 100 MG tablet Take 1 tablet (100 mg total) by mouth at bedtime.  . [DISCONTINUED] Insulin Isophane & Regular Human (NOVOLIN 70/30 FLEXPEN) (70-30) 100 UNIT/ML PEN Inject 70 Units into the skin 2 (two) times daily with a meal.   No facility-administered encounter medications on file as of 07/12/2019.     ALLERGIES: Allergies  Allergen Reactions  . Amoxicillin Swelling and Other (See Comments)    Oral swelling   . Doxycycline Nausea And Vomiting  . Levofloxacin Other (See Comments)    headache  . Propoxyphene  N-Acetaminophen Nausea And Vomiting    VACCINATION STATUS: Immunization History  Administered Date(s) Administered  . Influenza,inj,Quad PF,6+ Mos 07/18/2018  . Pneumococcal Polysaccharide-23 07/18/2018    Diabetes She presents for her follow-up diabetic visit. She has type 2 diabetes mellitus. Onset time: She was diagnosed at approximate age of 58  years. Her disease course has been improving. There are no hypoglycemic associated symptoms. Pertinent negatives for hypoglycemia include no confusion, headaches, pallor, seizures or tremors. Associated symptoms include fatigue. Pertinent negatives for diabetes include no chest pain, no polydipsia, no polyphagia and no polyuria. There are no hypoglycemic complications. Symptoms are improving. Diabetic complications include heart disease. (Status post triple coronary bypass in November 2019.) Risk factors for coronary artery disease include diabetes mellitus, dyslipidemia, family history, hypertension, obesity, sedentary lifestyle and tobacco exposure. Current diabetic treatment includes insulin injections and oral agent (monotherapy). She is following a generally unhealthy diet. When asked about meal planning, she reported none. She has not had a previous visit with a dietitian. She rarely participates in exercise. Her home blood glucose trend is fluctuating minimally. Her breakfast blood glucose range is generally 180-200 mg/dl. Her dinner blood glucose range is generally 140-180 mg/dl. Her overall blood glucose range is 180-200 mg/dl. (She continues to report severe hyperglycemia averaging 200-250 milligrams per DL.    ) An ACE inhibitor/angiotensin II receptor blocker is being taken. Eye exam is current.  Hypertension This is a chronic problem. The current episode started more than 1 year ago. The problem is controlled. Pertinent negatives include no chest pain, headaches, palpitations or shortness of breath. Risk factors for coronary artery disease  include dyslipidemia, diabetes mellitus, obesity, sedentary lifestyle, smoking/tobacco exposure and family history. Past treatments include calcium channel blockers. Hypertensive end-organ damage includes CAD/MI.  Hyperlipidemia This is a chronic problem. The current episode started more than 1 year ago. Exacerbating diseases include diabetes, hypothyroidism and obesity. Pertinent negatives include no chest pain, myalgias or shortness of breath. Risk factors for coronary artery disease include diabetes mellitus, dyslipidemia, family history, obesity, hypertension, a sedentary lifestyle and post-menopausal.     Objective:    There were no vitals taken for this visit.  Wt Readings from Last 3 Encounters:  02/06/19 (!) 319 lb (144.7 kg)  01/30/19 (!) 319 lb (144.7 kg)  09/13/18 (!) 320 lb 12.8 oz (145.5 kg)      CMP ( most recent) CMP     Component Value Date/Time   NA 143 05/21/2019 1309   K 4.4 05/21/2019 1309   CL 102 05/21/2019 1309   CO2 32 05/21/2019 1309   GLUCOSE 102 05/21/2019 1309   BUN 27 (H) 05/21/2019 1309   CREATININE 1.17 (H) 05/21/2019 1309   CALCIUM 9.6 05/21/2019 1309   PROT 6.9 05/21/2019 1309   ALBUMIN 3.0 (L) 07/17/2018 0514   AST 23 05/21/2019 1309   ALT 28 05/21/2019 1309   ALKPHOS 75 07/17/2018 0514   BILITOT 0.6 05/21/2019 1309   GFRNONAA 51 (L) 05/21/2019 1309   GFRAA 59 (L) 05/21/2019 1309     Diabetic Labs (most recent): Lab Results  Component Value Date   HGBA1C 9.6 (H) 05/21/2019   HGBA1C 9.7 (H) 01/30/2019   HGBA1C 10.7 (H) 07/17/2018     Lipid Panel ( most recent) Lipid Panel     Component Value Date/Time   CHOL 251 (H) 01/30/2019 0934   TRIG 243 (H) 01/30/2019 0934   HDL 47 (L) 01/30/2019 0934   CHOLHDL 5.3 (H) 01/30/2019 0934   VLDL UNABLE TO CALCULATE IF TRIGLYCERIDE OVER 400 mg/dL 06/28/2009 0611   LDLCALC 163 (H) 01/30/2019 0934     Assessment & Plan:   1. DM type 2 causing vascular disease ([place])  - GEM CONKLE  has currently uncontrolled symptomatic type 2 DM since 58 years of age. -She reports  significantly improved glycemic profile, between 159-267 fasting, between 127- 152 before supper.   She is known to have persistent and severe hyperglycemia,  recent A1c of 9.7%.   -her diabetes is complicated by coronary artery disease status post triple bypass in November 2019, morbid obesity, history of smoking, sedentary life and she remains at a high risk for more acute and chronic complications which include CAD, CVA, CKD, retinopathy, and neuropathy. These are all discussed in detail with her.  - I have counseled her on diet management and weight loss, by adopting a carbohydrate restricted/protein rich diet.  - she  admits there is a room for improvement in her diet and drink choices. -  Suggestion is made for her to avoid simple carbohydrates  from her diet including Cakes, Sweet Desserts / Pastries, Ice Cream, Soda (diet and regular), Sweet Tea, Candies, Chips, Cookies, Sweet Pastries,  Store Bought Juices, Alcohol in Excess of  1-2 drinks a day, Artificial Sweeteners, Coffee Creamer, and "Sugar-free" Products. This will help patient to have stable blood glucose profile and potentially avoid unintended weight gain.  - I encouraged her to switch to  unprocessed or minimally processed complex starch and increased protein intake (animal or plant source), fruits, and vegetables.  - she is advised to stick to a routine mealtimes to eat 3 meals  a day and avoid unnecessary snacks ( to snack only to correct hypoglycemia).   - she will be scheduled with Norm Salt, RDN, CDE for individualized diabetes education.  - I have approached her with the following individualized plan to manage diabetes and patient agrees:   -Based on her current glycemic burden as well as her comorbidities, she will likely require intensive treatment with basal/bolus insulin in order for her to achieve control of diabetes to target.   She has questionable engagement for proper monitoring of blood glucose for safe use of insulin. -Based on her reported glycemic profile, she is achieving better control with Novolin 70/30 and she is affording this medication.    -She is advised to increase her Novolin 70/30 to 70 units with breakfast and 80 units with supper for pre-meal blood glucose readings above  above 90 mg /dl. -She is advised to continue monitoring blood glucose 4 times a day- -before meals and at bedtime.   - she is encouraged to call clinic for blood glucose levels less than 70 or above 200 mg /dl. - she is advised to continue glipizide 10 mg p.o. twice daily with breakfast and supper, - she will be considered for incretin therapy as appropriate next visit.  - Patient specific target  A1c;  LDL, HDL, Triglycerides, and  Waist Circumference were discussed in detail.  2) Blood Pressure /Hypertension: she is advised to home monitor blood pressure and report if > 140/90 on 2 separate readings.   she is advised to continue her current medications including metoprolol 25 mg p.o. daily, amlodipine 10mg  p.o. daily with breakfast .  She also has Lasix as needed.  She will be considered for low-dose ACE inhibitors on subsequent visits.  3) Lipids/Hyperlipidemia:   Review of her recent lipid panel showed uncontrolled triglyceridemia greater than 700.  She is advised to continue Lipitor 80 mg p.o. daily at bedtime.    -  Side effects and precautions discussed with her.  4)  Weight/Diet:  She is obese. -   clearly complicating her diabetes care.  I discussed with her the fact that loss of 5 - 10% of her  current body weight will have the most impact on her diabetes management.  CDE Consult will be initiated . Exercise, and detailed carbohydrates information provided  -  detailed on discharge instructions.  5) hypothyroidism: She is currently on levothyroxine 50 mcg p.o. every morning, her dose adjusted recently. Previsit thyroid  function tests are consistent with appropriate replacement.  - We discussed about the correct intake of her thyroid hormone, on empty stomach at fasting, with water, separated by at least 30 minutes from breakfast and other medications,  and separated by more than 4 hours from calcium, iron, multivitamins, acid reflux medications (PPIs). -Patient is made aware of the fact that thyroid hormone replacement is needed for life, dose to be adjusted by periodic monitoring of thyroid function tests.  6) vitamin D  Deficiency --She is on ongoing treatment with vitamin D 3 5000 units daily for the next 90 days.   7) Chronic Care/Health Maintenance:  -she  is on  Statin medications and  is encouraged to initiate and continue to follow up with Ophthalmology, Dentist,  Podiatrist at least yearly or according to recommendations, and advised to   stay away from smoking. I have recommended yearly flu vaccine and pneumonia vaccine at least every 5 years; moderate intensity exercise for up to 150 minutes weekly; and  sleep for at least 7 hours a day.  - she is  advised to maintain close follow up with Barbie Banner, MD for primary care needs, as well as her other providers for optimal and coordinated care.  - Patient Care Time Today:  25 min, of which >50% was spent in  counseling and the rest reviewing her  current and  previous labs/studies, previous treatments, her blood glucose readings, and medications' doses and developing a plan for long-term care based on the latest recommendations for standards of care.   Maria Scales participated in the discussions, expressed understanding, and voiced agreement with the above plans.  All questions were answered to her satisfaction. she is encouraged to contact clinic should she have any questions or concerns prior to her return visit.   Follow up plan: - Return in about 9 weeks (around 09/13/2019) for Bring Meter and Logs- A1c in Office, Include 8 log  sheets.  Marquis Lunch, MD [facility] [address] [place], [place] [address] Phone: [phone]  Fax: [phone]    07/12/2019, 2:49 PM  This note was partially dictated with voice recognition software. Similar sounding words can be transcribed inadequately or may not  be corrected upon review.

## 2019-08-13 ENCOUNTER — Encounter (HOSPITAL_COMMUNITY): Payer: Self-pay | Admitting: Psychiatry

## 2019-08-13 ENCOUNTER — Ambulatory Visit (INDEPENDENT_AMBULATORY_CARE_PROVIDER_SITE_OTHER): Payer: Medicare HMO | Admitting: Psychiatry

## 2019-08-13 ENCOUNTER — Other Ambulatory Visit: Payer: Self-pay

## 2019-08-13 DIAGNOSIS — F3130 Bipolar disorder, current episode depressed, mild or moderate severity, unspecified: Secondary | ICD-10-CM

## 2019-08-13 MED ORDER — RISPERIDONE 2 MG PO TABS: 2.0000 mg | ORAL_TABLET | Freq: Two times a day (BID) | ORAL | 2 refills | Status: DC

## 2019-08-13 MED ORDER — TRAZODONE HCL 100 MG PO TABS: 100.0000 mg | ORAL_TABLET | Freq: Every day | ORAL | 2 refills | Status: DC

## 2019-08-13 MED ORDER — HYDROXYZINE HCL 50 MG PO TABS: 50.0000 mg | ORAL_TABLET | Freq: Four times a day (QID) | ORAL | 2 refills | Status: DC | PRN

## 2019-08-13 MED ORDER — BUSPIRONE HCL 30 MG PO TABS: 30.0000 mg | ORAL_TABLET | Freq: Three times a day (TID) | ORAL | 2 refills | Status: DC

## 2019-08-13 MED ORDER — ESCITALOPRAM OXALATE 20 MG PO TABS: 20.0000 mg | ORAL_TABLET | Freq: Three times a day (TID) | ORAL | 2 refills | Status: DC

## 2019-08-13 NOTE — Progress Notes (Signed)
Virtual Visit via Telephone Note  I connected with [name] on 08/13/19 at 10:20 AM EST by telephone and verified that I am speaking with the correct person using two identifiers.   I discussed the limitations, risks, security and privacy concerns of performing an evaluation and management service by telephone and the availability of in person appointments. I also discussed with the patient that there may be a patient responsible charge related to this service. The patient expressed understanding and agreed to proceed.     I discussed the assessment and treatment plan with the patient. The patient was provided an opportunity to ask questions and all were answered. The patient agreed with the plan and demonstrated an understanding of the instructions.   The patient was advised to call back or seek an in-person evaluation if the symptoms worsen or if the condition fails to improve as anticipated.  I provided 15 minutes of non-face-to-face time during this encounter.   [name], MD  [facility] MD/PA/NP OP Progress Note  08/13/2019 10:59 AM [name]  MRN:  [id number]  Chief Complaint:  Chief Complaint    Depression; Anxiety; Manic Behavior; Follow-up     HPI: This patient is a 58 year oldwidowed white female who lives alonein [place]. She has 2 grown daughters and one 16 year old grandson. She is on disability for both back pain and bipolar disorder.  The patient was referred by Dr. Lolly Mustache from our [facility] clinic. The patient would like to start coming here as it is too far for her to drive to the other clinic.  The patient states that she has had problems with mood since her mid 59s. She began losing her temper having severe outburst mood swings and lashing out at people. 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 She began being paranoid and accusing people of doing things behind her back or talking about her.  She was hospitalized 4 times the last time being [facility] in [facility]. At that time she was very depressed hallucinating and hearing command hallucinations to cut herself or kill herself. She was Stabilized on a combination of lithium Wellbutrin and Risperdal.She had seen Dr. Lolly Mustache for a while and had been transferred to me after it became too far to drive to the [facility] clinic  The patient returns via telephone after 2 months.  She states that she is "depressed" but on further questioning it is really that she is annoyed and frustrated.  She fell and broke part of her plumbing in the bathroom.  Now she is not able to use the bath.  Her son-in-law is supposed to fix it but he is too busy with work and has not been able to fix it.  Consequently she is going to her daughters every day to take a shower.  She did get a loan from her bank to make the necessary repairs and I urged her to call a plumber and get an estimate rather than depend on family to do it when they are obviously not able or willing to do so.  She denies suicidal ideation auditory visual destinations or paranoia.  She states that she is sleeping fairly well.  She is somewhat anxious but all seems to be centered on this shower problem. Visit Diagnosis:    ICD-10-CM   1. Bipolar I disorder, most recent episode depressed (HCC)  F31.30     Past Psychiatric History: four previous hospitalizations  for bipolar disorder, long-term outpatient treatment  Past Medical History:  Past Medical History:  Diagnosis Date  . Asthma   . Back pain   . CAD (coronary artery disease)    a. 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   . Chest pain   . Diabetes mellitus type II   . Headache   . High cholesterol   . Hypertension   . Obesity, morbid ([place])   . Other and unspecified bipolar disorders    . 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 Thyroid disease     Past Surgical History:  Procedure Laterality Date  . TUBAL LIGATION     Bilateral    Family Psychiatric History: see below  Family History:  Family History  Problem Relation Age of Onset  . Heart attack Maternal Uncle   . Depression Maternal Uncle   . Depression Mother   . Alcohol abuse Maternal Grandfather   . Alcohol abuse Maternal Uncle   . Depression Maternal Grandmother     Social History:  Social History   Socioeconomic History  . Marital status: Widowed    Spouse name: Not on file  . Number of children: Not on file  . Years of education: Not on file  . Highest education level: Not on file  Occupational History  . Occupation: Disabled    Fish farm manager: UNEMPLOYED  Social Needs  . Financial resource strain: Not on file  . Food insecurity    Worry: Not on file    Inability: Not on file  . Transportation needs    Medical: Not on file    Non-medical: Not on file  Tobacco Use  . Smoking status: Never Smoker  . Smokeless tobacco: Never Used  Substance and Sexual Activity  . Alcohol use: No    Alcohol/week: 0.0 standard drinks  . Drug use: No    Comment: 05/13/2016 per pt no  . Sexual activity: Yes    Birth control/protection: Surgical  Lifestyle  . Physical activity    Days per week: Not on file    Minutes per session: Not on file  . Stress: Not on file  Relationships  . Social Herbalist on phone: Not on file    Gets together: Not on file    Attends religious service: Not on file    Active member of club or organization: Not on file    Attends meetings of clubs or organizations: Not on file    Relationship status: Not on file  Other Topics Concern  . Not on file  Social History Narrative   Married   No regular exercise    Allergies:  Allergies  Allergen Reactions  . Amoxicillin Swelling and Other (See Comments)    Oral swelling   . Doxycycline Nausea And Vomiting  . Levofloxacin  Other (See Comments)    headache  . Propoxyphene N-Acetaminophen Nausea And Vomiting    Metabolic Disorder Labs: Lab Results  Component Value Date   HGBA1C 9.6 (H) 05/21/2019   MPG 229 05/21/2019   MPG 232 01/30/2019   No results found for: PROLACTIN Lab Results  Component Value Date   CHOL 251 (H) 01/30/2019   TRIG 243 (H) 01/30/2019   HDL 47 (L) 01/30/2019   CHOLHDL 5.3 (H) 01/30/2019   VLDL UNABLE TO CALCULATE IF TRIGLYCERIDE OVER 400 mg/dL 06/28/2009   LDLCALC 163 (H) 01/30/2019  LDLCALC  06/28/2009    UNABLE TO CALCULATE IF TRIGLYCERIDE OVER 400 mg/dL        Total Cholesterol/HDL:CHD Risk Coronary Heart Disease Risk Table                     Men   Women  1/2 Average Risk   3.4   3.3  Average Risk       5.0   4.4  2 X Average Risk   9.6   7.1  3 X Average Risk  23.4   11.0        Use the calculated Patient Ratio above and the CHD Risk Table to determine the patient's CHD Risk.        ATP III CLASSIFICATION (LDL):  <100     mg/dL   Optimal  751-700  mg/dL   Near or Above                    Optimal  130-159  mg/dL   Borderline  174-944  mg/dL   High  >967     mg/dL   Very High   Lab Results  Component Value Date   TSH 2.07 01/30/2019   TSH 4.88 (H) 02/03/2016    Therapeutic Level Labs: Lab Results  Component Value Date   LITHIUM 0.6 (L) 07/07/2016   LITHIUM 0.6 (L) 03/10/2016   No results found for: VALPROATE No components found for:  CBMZ  Current Medications: Current Outpatient Medications  Medication Sig Dispense Refill  . albuterol (PROVENTIL HFA;VENTOLIN HFA) 108 (90 Base) MCG/ACT inhaler Inhale 2 puffs into the lungs every 6 (six) hours as needed for wheezing or shortness of breath. 1 Inhaler 0  . amLODipine (NORVASC) 10 MG tablet Take 1 tablet (10 mg total) by mouth daily. For high blood pressure 30 tablet 0  . atorvastatin (LIPITOR) 80 MG tablet Take 1 tablet (80 mg total) by mouth every morning. (Patient taking differently: Take 80 mg by  mouth daily at 6 PM. ) 30 tablet 0  . busPIRone (BUSPAR) 30 MG tablet Take 1 tablet (30 mg total) by mouth 3 (three) times daily. 270 tablet 2  . Cholecalciferol (VITAMIN D3) 125 MCG (5000 UT) CAPS Take 1 capsule (5,000 Units total) by mouth daily. 90 capsule 0  . clopidogrel (PLAVIX) 75 MG tablet Take 1 tablet (75 mg total) by mouth daily. 30 tablet 0  . escitalopram (LEXAPRO) 20 MG tablet Take 1 tablet (20 mg total) by mouth 3 (three) times daily. for mood control 270 tablet 2  . furosemide (LASIX) 20 MG tablet Take 1 tablet (20 mg total) by mouth daily. 90 tablet 3  . glipiZIDE (GLUCOTROL) 10 MG tablet Take 1 tablet (10 mg total) by mouth 2 (two) times daily before a meal. 60 tablet 0  . hydrOXYzine (ATARAX/VISTARIL) 50 MG tablet Take 1 tablet (50 mg total) by mouth every 6 (six) hours as needed for anxiety. 90 tablet 2  . Insulin Isophane & Regular Human (NOVOLIN 70/30 FLEXPEN) (70-30) 100 UNIT/ML PEN Inject 70-80 Units into the skin 2 (two) times daily with a meal. 10 pen 2  . levothyroxine (SYNTHROID, LEVOTHROID) 50 MCG tablet Take 1 tablet (50 mcg total) by mouth daily before breakfast. For hypothyroidism 30 tablet 0  . metoprolol succinate (TOPROL XL) 25 MG 24 hr tablet Take 1 tablet (25 mg total) by mouth daily. 90 tablet 3  . nitroGLYCERIN (NITROSTAT) 0.4 MG SL tablet Place under the tongue.    Marland Kitchen  pantoprazole (PROTONIX) 40 MG tablet Take 1 tablet (40 mg total) by mouth daily. 30 tablet 1  . Potassium Chloride ER 20 MEQ TBCR Take 20 mEq by mouth daily. 90 tablet 3  . risperiDONE (RISPERDAL) 2 MG tablet Take 1 tablet (2 mg total) by mouth 2 (two) times daily. 180 tablet 2  . rizatriptan (MAXALT) 5 MG tablet Take by mouth.    . traZODone (DESYREL) 100 MG tablet Take 1 tablet (100 mg total) by mouth at bedtime. 90 tablet 2   No current facility-administered medications for this visit.      Musculoskeletal: Strength & Muscle Tone: decreased Gait & Station: normal Patient leans:  N/A  Psychiatric Specialty Exam: Review of Systems  Musculoskeletal: Positive for back pain and joint pain.  Psychiatric/Behavioral: The patient is nervous/anxious.   All other systems reviewed and are negative.   There were no vitals taken for this visit.There is no height or weight on file to calculate BMI.  General Appearance: NA  Eye Contact:  NA  Speech:  Clear and Coherent  Volume:  Normal  Mood:  Anxious  Affect:  NA  Thought Process:  Goal Directed  Orientation:  Full (Time, Place, and Person)  Thought Content: Rumination   Suicidal Thoughts:  No  Homicidal Thoughts:  No  Memory:  Immediate;   Good Recent;   Fair Remote;   Fair  Judgement:  Fair  Insight:  Fair  Psychomotor Activity:  Decreased  Concentration:  Concentration: Fair and Attention Span: Fair  Recall:  Good  Fund of Knowledge: Fair  Language: Good  Akathisia:  No  Handed:  Right  AIMS (if indicated): not done  Assets:  Communication Skills Desire for Improvement Resilience Social Support  ADL's:  Intact  Cognition: WNL  Sleep:  Good   Screenings: AIMS     [facility]  AIMS Total Score  0    AUDIT     Admission ([facility]  Alcohol Use Disorder Identification Test Final Score (AUDIT)  0    MDI     Office Visit from [facility]/13/2016 [facility]  Total Score (max 50)  25    PHQ2-9     Nutrition from 02/06/2019 in [facility] [facility] from 06/21/2016 in [facility]  PHQ-2 Total Score  0  4  PHQ-9 Total Score  -  15       Assessment and Plan: This patient is a 58 year old female with a history of significant heart disease, type 2 diabetes and bipolar disorder.  For the most part she is doing okay on her current regimen.  She will  continue Respinol 2 mg twice daily for mood stabilization, trazodone 100 mg at bedtime for sleep, BuSpar 30 mg 3 times daily for anxiety, Lexapro 60 mg daily for depression and hydroxyzine 50 mg every 6 hours as needed for anxiety.  She will return to see me in 2 months   Diannia Rudereborah , MD 08/13/2019, 10:59 AM

## 2019-09-14 ENCOUNTER — Ambulatory Visit: Payer: Medicare HMO | Admitting: "Endocrinology

## 2019-10-02 ENCOUNTER — Other Ambulatory Visit: Payer: Self-pay | Admitting: Student

## 2019-10-03 NOTE — Telephone Encounter (Signed)
This is a [place] pt.  °

## 2019-10-15 ENCOUNTER — Other Ambulatory Visit: Payer: Self-pay

## 2019-10-15 ENCOUNTER — Ambulatory Visit (INDEPENDENT_AMBULATORY_CARE_PROVIDER_SITE_OTHER): Payer: Medicare HMO | Admitting: Psychiatry

## 2019-10-15 ENCOUNTER — Encounter (HOSPITAL_COMMUNITY): Payer: Self-pay | Admitting: Psychiatry

## 2019-10-15 DIAGNOSIS — F3130 Bipolar disorder, current episode depressed, mild or moderate severity, unspecified: Secondary | ICD-10-CM

## 2019-10-15 MED ORDER — HYDROXYZINE HCL 50 MG PO TABS: 50.0000 mg | ORAL_TABLET | Freq: Four times a day (QID) | ORAL | 2 refills | Status: DC | PRN

## 2019-10-15 MED ORDER — TRAZODONE HCL 100 MG PO TABS: 100.0000 mg | ORAL_TABLET | Freq: Every day | ORAL | 2 refills | Status: DC

## 2019-10-15 MED ORDER — ESCITALOPRAM OXALATE 20 MG PO TABS: 20.0000 mg | ORAL_TABLET | Freq: Three times a day (TID) | ORAL | 2 refills | Status: DC

## 2019-10-15 MED ORDER — RISPERIDONE 2 MG PO TABS: 2.0000 mg | ORAL_TABLET | Freq: Two times a day (BID) | ORAL | 2 refills | Status: DC

## 2019-10-15 MED ORDER — BUSPIRONE HCL 30 MG PO TABS: 30.0000 mg | ORAL_TABLET | Freq: Three times a day (TID) | ORAL | 2 refills | Status: DC

## 2019-10-15 NOTE — Progress Notes (Signed)
Virtual Visit via Telephone Note  I connected with [name] on 10/15/19 at 10:00 AM EST by telephone and verified that I am speaking with the correct person using two identifiers.   I discussed the limitations, risks, security and privacy concerns of performing an evaluation and management service by telephone and the availability of in person appointments. I also discussed with the patient that there may be a patient responsible charge related to this service. The patient expressed understanding and agreed to proceed.     I discussed the assessment and treatment plan with the patient. The patient was provided an opportunity to ask questions and all were answered. The patient agreed with the plan and demonstrated an understanding of the instructions.   The patient was advised to call back or seek an in-person evaluation if the symptoms worsen or if the condition fails to improve as anticipated.  I provided 15 minutes of non-face-to-face time during this encounter.   [name], MD  [facility] MD/PA/NP OP Progress Note  10/15/2019 10:21 AM [name]  MRN:  [id number]  Chief Complaint:  Chief Complaint    Depression; Anxiety; Manic Behavior; Follow-up     HPI: This patient is a 59 year oldwidowed white female who lives alonein [place]. She has 2 grown daughters and one 27 year old grandson. She is on disability for both back pain and bipolar disorder.  The patient was referred by Dr. Adele Schilder from our [facility] clinic. The patient would like to start coming here as it is too far for her to drive to the other clinic.  The patient states that she has had problems with mood since her mid 85s. She began losing her temper having severe outburst mood swings and lashing out at people. 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 She began being paranoid and accusing people of doing things behind her back or talking about her.  She was hospitalized 4 times the last time being at the behavioral health hospital in 2011. At that time she was very depressed hallucinating and hearing command hallucinations to cut herself or kill herself. She was Stabilized on a combination of lithium Wellbutrin and Risperdal.She had seen Dr. Adele Schilder for a while and had been transferred to me after it became too far to drive to the [facility] clinic  The patient returns after 2 months.  She states overall she is doing okay.  Her son-in-law never fixed her bathroom so she is going to her daughters every day to take showers or baths.  She does not have the money to get it fixed.  She states that she is sleeping well and overall her mood is stable.  Her general health is okay but her blood sugars are still running in the 200s.  She is compliant with all of her medication.  She finds it hard to exercise because of back pain.  She denies any auditory visual hallucinations or any thoughts of self-harm or suicide.  She is staying isolated due to the coronavirus but does keep in touch with church members via phone or virtual meetings. Visit Diagnosis:    ICD-10-CM   1. Bipolar I disorder, most recent episode depressed ([place])  F31.30     Past Psychiatric History: 4 previous hospitalizations for bipolar disorder, long-term outpatient treatment  Past Medical History:  Past Medical History:  Diagnosis Date  . Asthma   . Back pain   . CAD (coronary artery disease)  a. 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   . Chest pain   . Diabetes mellitus type II   . Headache   . High cholesterol   . Hypertension   . Obesity, morbid (HCC)   . Other and unspecified bipolar disorders   . 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 Thyroid disease     Past Surgical History:  Procedure Laterality Date  . TUBAL LIGATION     Bilateral     Family Psychiatric History: see below  Family History:  Family History  Problem Relation Age of Onset  . Heart attack Maternal Uncle   . Depression Maternal Uncle   . Depression Mother   . Alcohol abuse Maternal Grandfather   . Alcohol abuse Maternal Uncle   . Depression Maternal Grandmother     Social History:  Social History   Socioeconomic History  . Marital status: Widowed    Spouse name: Not on file  . Number of children: Not on file  . Years of education: Not on file  . Highest education level: Not on file  Occupational History  . Occupation: Disabled    Employer: UNEMPLOYED  Tobacco Use  . Smoking status: Never Smoker  . Smokeless tobacco: Never Used  Substance and Sexual Activity  . Alcohol use: No    Alcohol/week: 0.0 standard drinks  . Drug use: No    Comment: 05/13/2016 per pt no  . Sexual activity: Yes    Birth control/protection: Surgical  Other Topics Concern  . Not on file  Social History Narrative   Married   No regular exercise   Social Determinants of Health   Financial Resource Strain:   . Difficulty of Paying Living Expenses: Not on file  Food Insecurity:   . Worried About Programme researcher, broadcasting/film/video in the Last Year: Not on file  . Ran Out of Food in the Last Year: Not on file  Transportation Needs:   . Lack of Transportation (Medical): Not on file  . Lack of Transportation (Non-Medical): Not on file  Physical Activity:   . Days of Exercise per Week: Not on file  . Minutes of Exercise per Session: Not on file  Stress:   . Feeling of Stress : Not on file  Social Connections:   . Frequency of Communication with Friends and Family: Not on file  . Frequency of Social Gatherings with Friends and Family: Not on file  . Attends Religious Services: Not on file  . Active Member of Clubs or Organizations: Not on file  . Attends Banker Meetings: Not on file  . Marital Status: Not on file    Allergies:  Allergies  Allergen  Reactions  . Amoxicillin Swelling and Other (See Comments)    Oral swelling   . Doxycycline Nausea And Vomiting  . Levofloxacin Other (See Comments)    headache  . Propoxyphene N-Acetaminophen Nausea And Vomiting    Metabolic Disorder Labs: Lab Results  Component Value Date   HGBA1C 9.6 (H) 05/21/2019   MPG 229 05/21/2019   MPG 232 01/30/2019   No results found for: PROLACTIN Lab Results  Component Value Date   CHOL 251 (H) 01/30/2019   TRIG 243 (H) 01/30/2019   HDL 47 (L) 01/30/2019   CHOLHDL 5.3 (H) 01/30/2019   VLDL UNABLE TO CALCULATE IF TRIGLYCERIDE OVER 400 mg/dL 18/29/9371   LDLCALC 696 (H)  01/30/2019   LDLCALC  06/28/2009    UNABLE TO CALCULATE IF TRIGLYCERIDE OVER 400 mg/dL        Total Cholesterol/HDL:CHD Risk Coronary Heart Disease Risk Table                     Men   Women  1/2 Average Risk   3.4   3.3  Average Risk       5.0   4.4  2 X Average Risk   9.6   7.1  3 X Average Risk  23.4   11.0        Use the calculated Patient Ratio above and the CHD Risk Table to determine the patient's CHD Risk.        ATP III CLASSIFICATION (LDL):  <100     mg/dL   Optimal  142-395  mg/dL   Near or Above                    Optimal  130-159  mg/dL   Borderline  320-233  mg/dL   High  >435     mg/dL   Very High   Lab Results  Component Value Date   TSH 2.07 01/30/2019   TSH 4.88 (H) 02/03/2016    Therapeutic Level Labs: Lab Results  Component Value Date   LITHIUM 0.6 (L) 07/07/2016   LITHIUM 0.6 (L) 03/10/2016   No results found for: VALPROATE No components found for:  CBMZ  Current Medications: Current Outpatient Medications  Medication Sig Dispense Refill  . albuterol (PROVENTIL HFA;VENTOLIN HFA) 108 (90 Base) MCG/ACT inhaler Inhale 2 puffs into the lungs every 6 (six) hours as needed for wheezing or shortness of breath. 1 Inhaler 0  . amLODipine (NORVASC) 10 MG tablet Take 1 tablet (10 mg total) by mouth daily. For high blood pressure 30 tablet 0  .  atorvastatin (LIPITOR) 80 MG tablet Take 1 tablet (80 mg total) by mouth every morning. (Patient taking differently: Take 80 mg by mouth daily at 6 PM. ) 30 tablet 0  . busPIRone (BUSPAR) 30 MG tablet Take 1 tablet (30 mg total) by mouth 3 (three) times daily. 270 tablet 2  . Cholecalciferol (VITAMIN D3) 125 MCG (5000 UT) CAPS Take 1 capsule (5,000 Units total) by mouth daily. 90 capsule 0  . clopidogrel (PLAVIX) 75 MG tablet TAKE 1 TABLET EVERY DAY 90 tablet 3  . escitalopram (LEXAPRO) 20 MG tablet Take 1 tablet (20 mg total) by mouth 3 (three) times daily. for mood control 270 tablet 2  . furosemide (LASIX) 20 MG tablet TAKE 1 TABLET (20 MG TOTAL) BY MOUTH DAILY. 90 tablet 3  . glipiZIDE (GLUCOTROL) 10 MG tablet Take 1 tablet (10 mg total) by mouth 2 (two) times daily before a meal. 60 tablet 0  . hydrOXYzine (ATARAX/VISTARIL) 50 MG tablet Take 1 tablet (50 mg total) by mouth every 6 (six) hours as needed for anxiety. 90 tablet 2  . Insulin Isophane & Regular Human (NOVOLIN 70/30 FLEXPEN) (70-30) 100 UNIT/ML PEN Inject 70-80 Units into the skin 2 (two) times daily with a meal. 10 pen 2  . levothyroxine (SYNTHROID, LEVOTHROID) 50 MCG tablet Take 1 tablet (50 mcg total) by mouth daily before breakfast. For hypothyroidism 30 tablet 0  . metoprolol succinate (TOPROL-XL) 25 MG 24 hr tablet TAKE 1 TABLET (25 MG TOTAL) BY MOUTH DAILY. 90 tablet 3  . nitroGLYCERIN (NITROSTAT) 0.4 MG SL tablet Place under the tongue.    Marland Kitchen  pantoprazole (PROTONIX) 40 MG tablet Take 1 tablet (40 mg total) by mouth daily. 30 tablet 1  . Potassium Chloride ER 20 MEQ TBCR TAKE 1 TABLET EVERY DAY 90 tablet 3  . risperiDONE (RISPERDAL) 2 MG tablet Take 1 tablet (2 mg total) by mouth 2 (two) times daily. 180 tablet 2  . rizatriptan (MAXALT) 5 MG tablet Take by mouth.    . traZODone (DESYREL) 100 MG tablet Take 1 tablet (100 mg total) by mouth at bedtime. 90 tablet 2   No current facility-administered medications for this visit.      Musculoskeletal: Strength & Muscle Tone: decreased Gait & Station: normal Patient leans: N/A  Psychiatric Specialty Exam: Review of Systems  Musculoskeletal: Positive for arthralgias and back pain.  All other systems reviewed and are negative.   There were no vitals taken for this visit.There is no height or weight on file to calculate BMI.  General Appearance: NA  Eye Contact:  NA  Speech:  Clear and Coherent  Volume:  Normal  Mood:  Euthymic  Affect:  NA  Thought Process:  Goal Directed  Orientation:  Full (Time, Place, and Person)  Thought Content: WDL   Suicidal Thoughts:  No  Homicidal Thoughts:  No  Memory:  Immediate;   Good Recent;   Good Remote;   Good  Judgement:  Good  Insight:  Fair  Psychomotor Activity:  Decreased  Concentration:  Concentration: Good and Attention Span: Good  Recall:  Good  Fund of Knowledge: Fair  Language: Good  Akathisia:  No  Handed:  Right  AIMS (if indicated): not done  Assets:  Communication Skills Desire for Improvement Resilience Social Support Talents/Skills  ADL's:  Intact  Cognition: WNL  Sleep:  Good   Screenings: AIMS     [facility]  AIMS Total Score  0    AUDIT     Admission ([facility]charged[facility]  Alcohol Use Disorder Identification Test Final Score (AUDIT)  0    MDI     Office Visit from 04/13/2016 in [facility]  Total Score (max 50)  25    PHQ2-9     Nutrition from 02/06/2019 in [facility] Counselor from 06/21/2016 in [facility]  PHQ-2 Total Score  0  4  PHQ-9 Total Score  --  15       Assessment and Plan: This patient is a 59 year old female with a history of significant heart disease type 2 diabetes and bipolar disorder.  She seems to be  doing well on her current psychiatric regimen.  She will continue Risperdal 2 mg twice daily for mood stabilization, trazodone 100 mg at bedtime for sleep, BuSpar 30 mg 3 times daily for anxiety, Lexapro 60 mg daily for depression and hydroxyzine 50 mg every 6 hours as needed for anxiety.  She will return to see me in 3 months   [name], MD 10/15/2019, 10:21 AM

## 2019-11-26 ENCOUNTER — Telehealth: Payer: Self-pay | Admitting: "Endocrinology

## 2019-11-26 ENCOUNTER — Other Ambulatory Visit: Payer: Self-pay

## 2019-11-26 MED ORDER — NOVOLIN 70/30 FLEXPEN (70-30) 100 UNIT/ML ~~LOC~~ SUPN: 70.0000 [IU] | PEN_INJECTOR | Freq: Two times a day (BID) | SUBCUTANEOUS | 2 refills | Status: DC

## 2019-11-26 NOTE — Telephone Encounter (Signed)
Pt would like you to call her Novolin at [facility] and just let her know

## 2019-11-26 NOTE — Telephone Encounter (Signed)
Called Rx for novolin 70/30 70 units with breakfast and 80 units with supper to [facility]. Tried to make pt aware but did not receive an answer.

## 2019-12-19 ENCOUNTER — Other Ambulatory Visit: Payer: Self-pay

## 2019-12-19 MED ORDER — NOVOLIN 70/30 FLEXPEN (70-30) 100 UNIT/ML ~~LOC~~ SUPN: 70.0000 [IU] | PEN_INJECTOR | Freq: Two times a day (BID) | SUBCUTANEOUS | 0 refills | Status: DC

## 2020-01-14 ENCOUNTER — Ambulatory Visit (HOSPITAL_COMMUNITY): Payer: Medicare HMO | Admitting: Psychiatry

## 2020-01-23 ENCOUNTER — Telehealth (HOSPITAL_COMMUNITY): Payer: Medicare HMO | Admitting: Psychiatry

## 2020-01-23 ENCOUNTER — Other Ambulatory Visit: Payer: Self-pay

## 2020-01-24 ENCOUNTER — Other Ambulatory Visit: Payer: Self-pay

## 2020-01-24 ENCOUNTER — Telehealth (INDEPENDENT_AMBULATORY_CARE_PROVIDER_SITE_OTHER): Payer: Medicare HMO | Admitting: Psychiatry

## 2020-01-24 ENCOUNTER — Encounter (HOSPITAL_COMMUNITY): Payer: Self-pay | Admitting: Psychiatry

## 2020-01-24 DIAGNOSIS — F3130 Bipolar disorder, current episode depressed, mild or moderate severity, unspecified: Secondary | ICD-10-CM

## 2020-01-24 MED ORDER — ESCITALOPRAM OXALATE 20 MG PO TABS: 20.0000 mg | ORAL_TABLET | Freq: Three times a day (TID) | ORAL | 2 refills | Status: DC

## 2020-01-24 MED ORDER — BUSPIRONE HCL 30 MG PO TABS: 30.0000 mg | ORAL_TABLET | Freq: Three times a day (TID) | ORAL | 2 refills | Status: DC

## 2020-01-24 MED ORDER — TRAZODONE HCL 100 MG PO TABS: 100.0000 mg | ORAL_TABLET | Freq: Every day | ORAL | 2 refills | Status: DC

## 2020-01-24 MED ORDER — HYDROXYZINE HCL 50 MG PO TABS: 50.0000 mg | ORAL_TABLET | Freq: Four times a day (QID) | ORAL | 2 refills | Status: DC | PRN

## 2020-01-24 MED ORDER — RISPERIDONE 2 MG PO TABS: 2.0000 mg | ORAL_TABLET | Freq: Two times a day (BID) | ORAL | 2 refills | Status: DC
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
## 2020-02-11 ENCOUNTER — Other Ambulatory Visit: Payer: Self-pay | Admitting: "Endocrinology

## 2020-02-18 ENCOUNTER — Other Ambulatory Visit: Payer: Self-pay

## 2020-02-18 ENCOUNTER — Encounter: Payer: Self-pay | Admitting: "Endocrinology

## 2020-02-18 ENCOUNTER — Ambulatory Visit (INDEPENDENT_AMBULATORY_CARE_PROVIDER_SITE_OTHER): Payer: Medicare HMO | Admitting: "Endocrinology

## 2020-02-18 ENCOUNTER — Telehealth: Payer: Self-pay | Admitting: "Endocrinology

## 2020-02-18 ENCOUNTER — Other Ambulatory Visit: Payer: Self-pay | Admitting: "Endocrinology

## 2020-02-18 VITALS — BP 128/83 | HR 73 | Ht 65.0 in | Wt 292.4 lb

## 2020-02-18 DIAGNOSIS — E1159 Type 2 diabetes mellitus with other circulatory complications: Secondary | ICD-10-CM

## 2020-02-18 DIAGNOSIS — E782 Mixed hyperlipidemia: Secondary | ICD-10-CM

## 2020-02-18 DIAGNOSIS — I10 Essential (primary) hypertension: Secondary | ICD-10-CM

## 2020-02-18 DIAGNOSIS — E039 Hypothyroidism, unspecified: Secondary | ICD-10-CM | POA: Diagnosis not present

## 2020-02-18 LAB — POCT GLYCOSYLATED HEMOGLOBIN (HGB A1C): Hemoglobin A1C: 10.5 % — AB (ref 4.0–5.6)

## 2020-02-18 MED ORDER — NOVOLIN 70/30 FLEXPEN (70-30) 100 UNIT/ML ~~LOC~~ SUPN: 80.0000 [IU] | PEN_INJECTOR | Freq: Two times a day (BID) | SUBCUTANEOUS | 0 refills | Status: DC

## 2020-02-18 NOTE — Telephone Encounter (Signed)
Pt needs her insulin isophane & regular human (NOVOLIN 70/30 FLEXPEN) (70-30) 100 UNIT/ML KwikPen to go to [facility] . It went to [facility]

## 2020-02-18 NOTE — Progress Notes (Signed)
02/18/2020   Endocrinology follow-up note   Subjective:    Patient ID: [name], female    DOB: Dec 28, 1960.  [name] is being seen in follow-up  for management of currently uncontrolled symptomatic diabetes requested by  Barbie Banner, MD.   Past Medical History:  Diagnosis Date  . Asthma   . Back pain   . CAD (coronary artery disease)    a. 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   . Chest pain   . Diabetes mellitus type II   . Headache   . High cholesterol   . Hypertension   . Obesity, morbid (HCC)   . Other and unspecified bipolar disorders   . 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 Thyroid disease     Past Surgical History:  Procedure Laterality Date  . TUBAL LIGATION     Bilateral    Social History   Socioeconomic History  . Marital status: Widowed    Spouse name: Not on file  . Number of children: Not on file  . Years of education: Not on file  . Highest education level: Not on file  Occupational History  . Occupation: Disabled    Employer: UNEMPLOYED  Tobacco Use  . Smoking status: Never Smoker  . Smokeless tobacco: Never Used  Substance and Sexual Activity  . Alcohol use: No    Alcohol/week: 0.0 standard drinks  . Drug use: No    Comment: 05/13/2016 per pt no  . Sexual activity: Yes    Birth control/protection: Surgical  Other Topics Concern  . Not on file  Social History Narrative   Married   No regular exercise   Social Determinants of Health   Financial Resource Strain:   . Difficulty of Paying Living Expenses:   Food Insecurity:   . Worried About Programme researcher, broadcasting/film/video in the Last Year:   . Barista in the Last Year:   Transportation Needs:   . Freight forwarder (Medical):   Marland Kitchen Lack of Transportation (Non-Medical):   Physical Activity:   . Days of Exercise per  Week:   . Minutes of Exercise per Session:   Stress:   . Feeling of Stress :   Social Connections:   . Frequency of Communication with Friends and Family:   . Frequency of Social Gatherings with Friends and Family:   . Attends Religious Services:   . Active Member of Clubs or Organizations:   . Attends Banker Meetings:   Marland Kitchen Marital Status:     Family History  Problem Relation Age of Onset  . Heart attack Maternal Uncle   . Depression Maternal Uncle   . Depression Mother   . Alcohol abuse Maternal Grandfather   . Alcohol abuse Maternal Uncle   . Depression Maternal Grandmother     Outpatient Encounter Medications as of 02/18/2020  Medication Sig  . albuterol (PROVENTIL HFA;VENTOLIN HFA) 108 (90 Base) MCG/ACT inhaler Inhale 2 puffs into the lungs every 6 (six) hours as needed for wheezing or shortness of breath.  Marland Kitchen amLODipine (NORVASC) 10 MG tablet Take 1 tablet (10 mg total) by mouth daily. For high blood pressure  . atorvastatin (LIPITOR) 80 MG tablet Take 1 tablet (80 mg total) by mouth every morning. (Patient taking differently: Take 80 mg by mouth daily at 6 PM. )  . busPIRone (BUSPAR) 30  MG tablet Take 1 tablet (30 mg total) by mouth 3 (three) times daily.  . Cholecalciferol (VITAMIN D3) 125 MCG (5000 UT) CAPS Take 1 capsule (5,000 Units total) by mouth daily.  . clopidogrel (PLAVIX) 75 MG tablet TAKE 1 TABLET EVERY DAY  . escitalopram (LEXAPRO) 20 MG tablet Take 1 tablet (20 mg total) by mouth 3 (three) times daily. for mood control  . furosemide (LASIX) 20 MG tablet TAKE 1 TABLET (20 MG TOTAL) BY MOUTH DAILY.  Marland Kitchen glipiZIDE (GLUCOTROL) 10 MG tablet Take 1 tablet (10 mg total) by mouth 2 (two) times daily before a meal.  . hydrOXYzine (ATARAX/VISTARIL) 50 MG tablet Take 1 tablet (50 mg total) by mouth every 6 (six) hours as needed for anxiety.  . insulin isophane & regular human (NOVOLIN 70/30 FLEXPEN) (70-30) 100 UNIT/ML KwikPen Inject 80 Units into the skin 2 (two)  times daily with a meal.  . levothyroxine (SYNTHROID, LEVOTHROID) 50 MCG tablet Take 1 tablet (50 mcg total) by mouth daily before breakfast. For hypothyroidism  . metoprolol succinate (TOPROL-XL) 25 MG 24 hr tablet TAKE 1 TABLET (25 MG TOTAL) BY MOUTH DAILY.  . nitroGLYCERIN (NITROSTAT) 0.4 MG SL tablet Place under the tongue.  . pantoprazole (PROTONIX) 40 MG tablet Take 1 tablet (40 mg total) by mouth daily.  . Potassium Chloride ER 20 MEQ TBCR TAKE 1 TABLET EVERY DAY  . risperiDONE (RISPERDAL) 2 MG tablet Take 1 tablet (2 mg total) by mouth 2 (two) times daily.  . rizatriptan (MAXALT) 5 MG tablet Take by mouth.  . traZODone (DESYREL) 100 MG tablet Take 1 tablet (100 mg total) by mouth at bedtime.  . [DISCONTINUED] insulin isophane & regular human (NOVOLIN 70/30 FLEXPEN) (70-30) 100 UNIT/ML KwikPen Inject 70-80 Units into the skin 2 (two) times daily with a meal. INJECT 70 UNITS WITH BREAKFAST AND 80 UNITS WITH SUPPER   No facility-administered encounter medications on file as of 02/18/2020.    ALLERGIES: Allergies  Allergen Reactions  . Amoxicillin Swelling and Other (See Comments)    Oral swelling   . Doxycycline Nausea And Vomiting  . Levofloxacin Other (See Comments)    headache  . Propoxyphene N-Acetaminophen Nausea And Vomiting    VACCINATION STATUS: Immunization History  Administered Date(s) Administered  . Influenza,inj,Quad PF,6+ Mos 07/18/2018  . Pneumococcal Polysaccharide-23 07/18/2018    Diabetes She presents for her follow-up diabetic visit. She has type 2 diabetes mellitus. Onset time: She was diagnosed at approximate age of 39 years. Her disease course has been worsening. There are no hypoglycemic associated symptoms. Pertinent negatives for hypoglycemia include no confusion, headaches, pallor, seizures or tremors. Associated symptoms include blurred vision, fatigue, polydipsia and polyuria. Pertinent negatives for diabetes include no chest pain and no polyphagia.  There are no hypoglycemic complications. Symptoms are worsening. Diabetic complications include heart disease. (Status post triple coronary bypass in November 2019.) Risk factors for coronary artery disease include diabetes mellitus, dyslipidemia, family history, hypertension, obesity, sedentary lifestyle and tobacco exposure. Current diabetic treatment includes insulin injections and oral agent (monotherapy). Her weight is decreasing steadily. She is following a generally unhealthy diet. When asked about meal planning, she reported none. She has not had a previous visit with a dietitian. She rarely participates in exercise. Her home blood glucose trend is increasing steadily. (She did not bring any logs nor meter to review today.  Her point-of-care A1c is 10.5% improving from 9.6%.  ) An ACE inhibitor/angiotensin II receptor blocker is being taken. Eye exam is current.  Hypertension This is a chronic problem. The current episode started more than 1 year ago. The problem is controlled. Associated symptoms include blurred vision. Pertinent negatives include no chest pain, headaches, palpitations or shortness of breath. Risk factors for coronary artery disease include dyslipidemia, diabetes mellitus, obesity, sedentary lifestyle, smoking/tobacco exposure and family history. Past treatments include calcium channel blockers. Hypertensive end-organ damage includes CAD/MI.  Hyperlipidemia This is a chronic problem. The current episode started more than 1 year ago. Exacerbating diseases include diabetes, hypothyroidism and obesity. Pertinent negatives include no chest pain, myalgias or shortness of breath. Risk factors for coronary artery disease include diabetes mellitus, dyslipidemia, family history, obesity, hypertension, a sedentary lifestyle and post-menopausal.    Review of systems  Constitutional: + Progressive weight loss,  current  Body mass index is 48.66 kg/m. , no fatigue, no subjective hyperthermia,  no subjective hypothermia Eyes: no blurry vision, no xerophthalmia ENT: no sore throat, no nodules palpated in throat, no dysphagia/odynophagia, no hoarseness Cardiovascular: no Chest Pain, no Shortness of Breath, no palpitations, no leg swelling Respiratory: no cough, no shortness of breath Gastrointestinal: no Nausea/Vomiting/Diarhhea Musculoskeletal: no muscle/joint aches Skin: no rashes, no hyperemia Neurological: no tremors, no numbness, no tingling, no dizziness Psychiatric: no depression, no anxiety   Objective:    BP 128/83   Pulse 73   Ht 5\' 5"  (1.651 m)   Wt 292 lb 6.4 oz (132.6 kg)   BMI 48.66 kg/m   Wt Readings from Last 3 Encounters:  02/18/20 292 lb 6.4 oz (132.6 kg)  02/06/19 (!) 319 lb (144.7 kg)  01/30/19 (!) 319 lb (144.7 kg)     Physical Exam- Limited  Constitutional:  Body mass index is 48.66 kg/m. , not in acute distress, normal state of mind Eyes:  EOMI, no exophthalmos Neck: Supple Thyroid: No gross goiter Respiratory: Adequate breathing efforts Musculoskeletal: no gross deformities, strength intact in all four extremities, no gross restriction of joint movements Skin:  no rashes, no hyperemia Neurological: no tremor with outstretched hands,    CMP ( most recent) CMP     Component Value Date/Time   NA 143 05/21/2019 1309   K 4.4 05/21/2019 1309   CL 102 05/21/2019 1309   CO2 32 05/21/2019 1309   GLUCOSE 102 05/21/2019 1309   BUN 27 (H) 05/21/2019 1309   CREATININE 1.17 (H) 05/21/2019 1309   CALCIUM 9.6 05/21/2019 1309   PROT 6.9 05/21/2019 1309   ALBUMIN 3.0 (L) 07/17/2018 0514   AST 23 05/21/2019 1309   ALT 28 05/21/2019 1309   ALKPHOS 75 07/17/2018 0514   BILITOT 0.6 05/21/2019 1309   GFRNONAA 51 (L) 05/21/2019 1309   GFRAA 59 (L) 05/21/2019 1309     Diabetic Labs (most recent): Lab Results  Component Value Date   HGBA1C 10.5 (A) 02/18/2020   HGBA1C 9.6 (H) 05/21/2019   HGBA1C 9.7 (H) 01/30/2019     Lipid Panel ( most  recent) Lipid Panel     Component Value Date/Time   CHOL 251 (H) 01/30/2019 0934   TRIG 243 (H) 01/30/2019 0934   HDL 47 (L) 01/30/2019 0934   CHOLHDL 5.3 (H) 01/30/2019 0934   VLDL UNABLE TO CALCULATE IF TRIGLYCERIDE OVER 400 mg/dL 37/16/9678 9381   LDLCALC 163 (H) 01/30/2019 0934     Assessment & Plan:   1. DM type 2 causing vascular disease (HCC)  - MALANEY ORT has currently uncontrolled symptomatic type 2 DM since 59 years of age. -She did not bring any logs  nor meter to review.  Point-of-care A1c 7.5%, increasing from 9.6%.  She denies any hypoglycemia.    -her diabetes is complicated by coronary artery disease status post triple bypass in November 2019, morbid obesity, history of smoking, sedentary life and she remains at a high risk for more acute and chronic complications which include CAD, CVA, CKD, retinopathy, and neuropathy. These are all discussed in detail with her.  - I have counseled her on diet management and weight loss, by adopting a carbohydrate restricted/protein rich diet.  - she  admits there is a room for improvement in her diet and drink choices. -  Suggestion is made for her to avoid simple carbohydrates  from her diet including Cakes, Sweet Desserts / Pastries, Ice Cream, Soda (diet and regular), Sweet Tea, Candies, Chips, Cookies, Sweet Pastries,  Store Bought Juices, Alcohol in Excess of  1-2 drinks a day, Artificial Sweeteners, Coffee Creamer, and "Sugar-free" Products. This will help patient to have stable blood glucose profile and potentially avoid unintended weight gain.   - I encouraged her to switch to  unprocessed or minimally processed complex starch and increased protein intake (animal or plant source), fruits, and vegetables.  - she is advised to stick to a routine mealtimes to eat 3 meals  a day and avoid unnecessary snacks ( to snack only to correct hypoglycemia).   - she will be scheduled with Jearld Fenton, RDN, CDE for individualized  diabetes education.  - I have approached her with the following individualized plan to manage diabetes and patient agrees:   -Based on her current glycemic burden as well as her comorbidities, she would need intensive treatment with basal/bolus insulin in order for her to achieve control of diabetes to target. -However, she did not engage properly for safe use of insulin.   -She will be kept on the simplified treatment regimen using premixed insulin.  She is approached to start monitoring blood glucose 4 times a day-daily before meals and at bedtime and return in 10 days with her meter and logs for evaluation. -In the meantime, she is advised to increase her Novolin 70/30 to 80 units with breakfast and 80 units with supper, when  pre-meal blood glucose readings above  above 90 mg /dl.  - she is encouraged to call clinic for blood glucose levels less than 70 or above 200 mg /dl. - she is advised to continue glipizide 10 mg p.o. twice daily with breakfast and supper, - she will be considered for incretin therapy as appropriate next visit.  - Patient specific target  A1c;  LDL, HDL, Triglycerides, and  Waist Circumference were discussed in detail.  2) Blood Pressure /Hypertension:  Her blood pressure is controlled to target.   she is advised to continue her current medications including metoprolol 25 mg p.o. daily, amlodipine 10mg  p.o. daily with breakfast .  She also has Lasix as needed.  She will be considered for low-dose ACE inhibitors on subsequent visits.  3) Lipids/Hyperlipidemia:   Review of her recent lipid panel showed uncontrolled triglyceridemia greater than 700.  She is advised to continue Lipitor 80 mg p.o. daily at bedtime.   -  Side effects and precautions discussed with her.  4)  Weight/Diet:  She is obese-we will get BMI of 48.6. -   clearly complicating her diabetes care.  She is a candidate for modest weight loss.  I discussed with her the fact that loss of 5 - 10% of her   current body weight will  have the most impact on her diabetes management.  CDE Consult will be initiated . Exercise, and detailed carbohydrates information provided  -  detailed on discharge instructions.  5) hypothyroidism: She is currently on levothyroxine 50 mcg p.o. every morning, her dose adjusted recently. -She will be sent to lab today for CMP and thyroid function test.   - We discussed about the correct intake of her thyroid hormone, on empty stomach at fasting, with water, separated by at least 30 minutes from breakfast and other medications,  and separated by more than 4 hours from calcium, iron, multivitamins, acid reflux medications (PPIs). -Patient is made aware of the fact that thyroid hormone replacement is needed for life, dose to be adjusted by periodic monitoring of thyroid function tests.   6) vitamin D  Deficiency --She is status post treatment with vitamin D 3 5000 units daily for the next 90 days.   7) Chronic Care/Health Maintenance:  -she  is on  Statin medications and  is encouraged to initiate and continue to follow up with Ophthalmology, Dentist,  Podiatrist at least yearly or according to recommendations, and advised to   stay away from smoking. I have recommended yearly flu vaccine and pneumonia vaccine at least every 5 years; moderate intensity exercise for up to 150 minutes weekly; and  sleep for at least 7 hours a day.  - she is  advised to maintain close follow up with Barbie Banner, MD for primary care needs, as well as her other providers for optimal and coordinated care.  - Time spent on this patient care encounter:  45 min, of which > 50% was spent in  counseling and the rest reviewing her blood glucose logs , discussing her hypoglycemia and hyperglycemia episodes, reviewing her current and  previous labs / studies  ( including abstraction from other facilities) and medications  doses and developing a  long term treatment plan and documenting her care.    Please refer to Patient Instructions for Blood Glucose Monitoring and Insulin/Medications Dosing Guide"  in media tab for additional information. Please  also refer to " Patient Self Inventory" in the Media  tab for reviewed elements of pertinent patient history.  Heide Scales participated in the discussions, expressed understanding, and voiced agreement with the above plans.  All questions were answered to her satisfaction. she is encouraged to contact clinic should she have any questions or concerns prior to her return visit.    Follow up plan: - Return in about 10 days (around 02/28/2020), or meter and logs, for Labs Today- Non-Fasting Ok, Meter, Logs, A1c here., Meter, Logs, A1c here.Marquis Lunch, MD [facility] [address] [place], [place] [address] Phone: [phone]  Fax: [phone]    02/18/2020, 11:14 AM  This note was partially dictated with voice recognition software. Similar sounding words can be transcribed inadequately or may not  be corrected upon review.

## 2020-02-18 NOTE — Patient Instructions (Signed)

## 2020-02-19 LAB — COMPLETE METABOLIC PANEL WITH GFR
AG Ratio: 1.5 (calc) (ref 1.0–2.5)
ALT: 15 U/L (ref 6–29)
AST: 14 U/L (ref 10–35)
Albumin: 4.1 g/dL (ref 3.6–5.1)
Alkaline phosphatase (APISO): 95 U/L (ref 37–153)
BUN/Creatinine Ratio: 18 (calc) (ref 6–22)
BUN: 20 mg/dL (ref 7–25)
CO2: 30 mmol/L (ref 20–32)
Calcium: 9.9 mg/dL (ref 8.6–10.4)
Chloride: 95 mmol/L — ABNORMAL LOW (ref 98–110)
Creat: 1.12 mg/dL — ABNORMAL HIGH (ref 0.50–1.05)
GFR, Est African American: 62 mL/min/{1.73_m2} (ref >=60)
GFR, Est Non African American: 54 mL/min/{1.73_m2} — ABNORMAL LOW (ref >=60)
Globulin: 2.8 g/dL (calc) (ref 1.9–3.7)
Glucose, Bld: 396 mg/dL — ABNORMAL HIGH (ref 65–139)
Potassium: 4.3 mmol/L (ref 3.5–5.3)
Sodium: 135 mmol/L (ref 135–146)
Total Bilirubin: 0.6 mg/dL (ref 0.2–1.2)
Total Protein: 6.9 g/dL (ref 6.1–8.1)

## 2020-02-19 LAB — TSH: TSH: 2.77 mIU/L (ref 0.40–4.50)

## 2020-02-19 LAB — T4, FREE: Free T4: 1.3 ng/dL (ref 0.8–1.8)

## 2020-02-19 NOTE — Telephone Encounter (Signed)
Pt left a VM that her medicine was sent to [facility] but they are telling her she can not refill it right now because it is to early bc they said she picked it up somewhere else. Can you call [facility]?

## 2020-02-19 NOTE — Telephone Encounter (Signed)
Discussed with pt she needed to call [employer] to see if they have already filled the prescription and mailed it out to her per [place]. Understanding voiced.

## 2020-02-28 ENCOUNTER — Other Ambulatory Visit: Payer: Self-pay | Admitting: "Endocrinology

## 2020-02-28 ENCOUNTER — Telehealth: Payer: Self-pay | Admitting: "Endocrinology

## 2020-02-28 MED ORDER — FREESTYLE LIBRE 14 DAY READER DEVI: 1.0000 | Freq: Once | 0 refills | Status: AC

## 2020-02-28 MED ORDER — FREESTYLE LIBRE 14 DAY SENSOR MISC: 1.0000 | 2 refills | Status: DC

## 2020-02-28 NOTE — Telephone Encounter (Signed)
I agree with her needing [name], but we do not have the paper work documentation for medical necessity. I will send a rx for her to try.

## 2020-02-28 NOTE — Telephone Encounter (Signed)
Pt said her meter broke and wants an RX for [employer]. She has not been able to check her sugar since last appointment. She is scheduled to come back 6/14 but wants to cancel because she has not checked sugar.

## 2020-02-28 NOTE — Telephone Encounter (Signed)
Pt.notified

## 2020-03-06 ENCOUNTER — Telehealth: Payer: Self-pay | Admitting: "Endocrinology

## 2020-03-06 DIAGNOSIS — E1159 Type 2 diabetes mellitus with other circulatory complications: Secondary | ICD-10-CM

## 2020-03-06 MED ORDER — BLOOD GLUCOSE MONITOR KIT: 1.0000 | PACK | Freq: Four times a day (QID) | 0 refills | Status: AC

## 2020-03-06 NOTE — Telephone Encounter (Signed)
Sent in a generic script for glucometer to [facility] for pt to use in the meantime, pt is aware

## 2020-03-06 NOTE — Telephone Encounter (Signed)
Pt has an appointment on 6/14 but states she has not received her meter yet and would like to know if she need to keep this appt. Patient states she did get her labs done for that visit. Requesting call back

## 2020-03-06 NOTE — Telephone Encounter (Signed)
Spoke with pt and advised to still keep appt. Gave her info to contact [facility], will call back if any problems

## 2020-03-10 ENCOUNTER — Other Ambulatory Visit: Payer: Self-pay

## 2020-03-10 ENCOUNTER — Encounter: Payer: Self-pay | Admitting: "Endocrinology

## 2020-03-10 ENCOUNTER — Ambulatory Visit (INDEPENDENT_AMBULATORY_CARE_PROVIDER_SITE_OTHER): Payer: Medicare HMO | Admitting: "Endocrinology

## 2020-03-10 VITALS — BP 131/88 | HR 106 | Ht 65.0 in | Wt 296.2 lb

## 2020-03-10 DIAGNOSIS — E1159 Type 2 diabetes mellitus with other circulatory complications: Secondary | ICD-10-CM | POA: Diagnosis not present

## 2020-03-10 DIAGNOSIS — E039 Hypothyroidism, unspecified: Secondary | ICD-10-CM

## 2020-03-10 DIAGNOSIS — E782 Mixed hyperlipidemia: Secondary | ICD-10-CM

## 2020-03-10 DIAGNOSIS — E559 Vitamin D deficiency, unspecified: Secondary | ICD-10-CM

## 2020-03-10 DIAGNOSIS — I10 Essential (primary) hypertension: Secondary | ICD-10-CM

## 2020-03-10 MED ORDER — TRULICITY 0.75 MG/0.5ML ~~LOC~~ SOAJ
0.7500 mg | SUBCUTANEOUS | 2 refills | Status: DC
Start: 2020-03-10 — End: 2020-03-12

## 2020-03-10 NOTE — Progress Notes (Signed)
03/10/2020   Endocrinology follow-up note   Subjective:    Patient ID: [name], female    DOB: 1960/12/12.  [name] is being seen in follow-up  for management of currently uncontrolled symptomatic diabetes requested by  Christain Sacramento, MD.   Past Medical History:  Diagnosis Date  . Asthma   . Back pain   . CAD (coronary artery disease)    a. 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   . Chest pain   . Diabetes mellitus type II   . Headache   . High cholesterol   . Hypertension   . Obesity, morbid ([place])   . Other and unspecified bipolar disorders   . 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 Thyroid disease     Past Surgical History:  Procedure Laterality Date  . TUBAL LIGATION     Bilateral    Social History   Socioeconomic History  . Marital status: Widowed    Spouse name: Not on file  . Number of children: Not on file  . Years of education: Not on file  . Highest education level: Not on file  Occupational History  . Occupation: Disabled    Employer: UNEMPLOYED  Tobacco Use  . Smoking status: Never Smoker  . Smokeless tobacco: Never Used  Vaping Use  . Vaping Use: Never used  Substance and Sexual Activity  . Alcohol use: No    Alcohol/week: 0.0 standard drinks  . Drug use: No    Comment: 05/13/2016 per pt no  . Sexual activity: Yes    Birth control/protection: Surgical  Other Topics Concern  . Not on file  Social History Narrative   Married   No regular exercise   Social Determinants of Health   Financial Resource Strain:   . Difficulty of Paying Living Expenses:   Food Insecurity:   . Worried About Charity fundraiser in the Last Year:   . Arboriculturist in the Last Year:   Transportation Needs:   . Film/video editor (Medical):   Marland Kitchen Lack of Transportation (Non-Medical):    Physical Activity:   . Days of Exercise per Week:   . Minutes of Exercise per Session:   Stress:   . Feeling of Stress :   Social Connections:   . Frequency of Communication with Friends and Family:   . Frequency of Social Gatherings with Friends and Family:   . Attends Religious Services:   . Active Member of Clubs or Organizations:   . Attends Archivist Meetings:   Marland Kitchen Marital Status:     Family History  Problem Relation Age of Onset  . Heart attack Maternal Uncle   . Depression Maternal Uncle   . Depression Mother   . Alcohol abuse Maternal Grandfather   . Alcohol abuse Maternal Uncle   . Depression Maternal Grandmother     Outpatient Encounter Medications as of 03/10/2020  Medication Sig  . albuterol (PROVENTIL HFA;VENTOLIN HFA) 108 (90 Base) MCG/ACT inhaler Inhale 2 puffs into the lungs every 6 (six) hours as needed for wheezing or shortness of breath.  Marland Kitchen amLODipine (NORVASC) 10 MG tablet Take 1 tablet (10 mg total) by mouth daily. For high blood pressure  . atorvastatin (LIPITOR) 80 MG tablet Take 1 tablet (80 mg total) by mouth every morning. (Patient taking differently: Take 80 mg by mouth daily  at 6 PM. )  . blood glucose meter kit and supplies KIT 1 each by Does not apply route 4 (four) times daily. Dispense based on patient and insurance preference. Use up to four times daily as directed. (FOR ICD-9 250.00, 250.01).  . busPIRone (BUSPAR) 30 MG tablet Take 1 tablet (30 mg total) by mouth 3 (three) times daily.  . Cholecalciferol (VITAMIN D3) 125 MCG (5000 UT) CAPS Take 1 capsule (5,000 Units total) by mouth daily.  . clopidogrel (PLAVIX) 75 MG tablet TAKE 1 TABLET EVERY DAY  . Continuous Blood Gluc Sensor (FREESTYLE LIBRE 14 DAY SENSOR) MISC Inject 1 each into the skin every 14 (fourteen) days. Use as directed.  . Dulaglutide (TRULICITY) 0.30 SP/2.3RA SOPN Inject 0.5 mLs (0.75 mg total) into the skin once a week.  . escitalopram (LEXAPRO) 20 MG tablet Take 1  tablet (20 mg total) by mouth 3 (three) times daily. for mood control  . furosemide (LASIX) 20 MG tablet TAKE 1 TABLET (20 MG TOTAL) BY MOUTH DAILY.  Marland Kitchen glipiZIDE (GLUCOTROL) 10 MG tablet Take 1 tablet (10 mg total) by mouth 2 (two) times daily before a meal.  . hydrOXYzine (ATARAX/VISTARIL) 50 MG tablet Take 1 tablet (50 mg total) by mouth every 6 (six) hours as needed for anxiety.  . insulin isophane & regular human (NOVOLIN 70/30 FLEXPEN) (70-30) 100 UNIT/ML KwikPen Inject 80 Units into the skin 2 (two) times daily with a meal.  . levothyroxine (SYNTHROID, LEVOTHROID) 50 MCG tablet Take 1 tablet (50 mcg total) by mouth daily before breakfast. For hypothyroidism  . metoprolol succinate (TOPROL-XL) 25 MG 24 hr tablet TAKE 1 TABLET (25 MG TOTAL) BY MOUTH DAILY.  . nitroGLYCERIN (NITROSTAT) 0.4 MG SL tablet Place under the tongue.  . pantoprazole (PROTONIX) 40 MG tablet Take 1 tablet (40 mg total) by mouth daily.  . Potassium Chloride ER 20 MEQ TBCR TAKE 1 TABLET EVERY DAY  . risperiDONE (RISPERDAL) 2 MG tablet Take 1 tablet (2 mg total) by mouth 2 (two) times daily.  . rizatriptan (MAXALT) 5 MG tablet Take by mouth.  . traZODone (DESYREL) 100 MG tablet Take 1 tablet (100 mg total) by mouth at bedtime.   No facility-administered encounter medications on file as of 03/10/2020.    ALLERGIES: Allergies  Allergen Reactions  . Amoxicillin Swelling and Other (See Comments)    Oral swelling   . Doxycycline Nausea And Vomiting  . Levofloxacin Other (See Comments)    headache  . Propoxyphene N-Acetaminophen Nausea And Vomiting    VACCINATION STATUS: Immunization History  Administered Date(s) Administered  . Influenza,inj,Quad PF,6+ Mos 07/18/2018  . Pneumococcal Polysaccharide-23 07/18/2018    Diabetes She presents for her follow-up diabetic visit. She has type 2 diabetes mellitus. Onset time: She was diagnosed at approximate age of 40 years. Her disease course has been worsening. There are  no hypoglycemic associated symptoms. Pertinent negatives for hypoglycemia include no confusion, headaches, pallor, seizures or tremors. Associated symptoms include blurred vision, fatigue, polydipsia and polyuria. Pertinent negatives for diabetes include no chest pain and no polyphagia. There are no hypoglycemic complications. Symptoms are worsening. Diabetic complications include heart disease. (Status post triple coronary bypass in November 2019.) Risk factors for coronary artery disease include diabetes mellitus, dyslipidemia, family history, hypertension, obesity, sedentary lifestyle and tobacco exposure. Current diabetic treatment includes insulin injections and oral agent (monotherapy). Her weight is fluctuating minimally. She is following a generally unhealthy diet. When asked about meal planning, she reported none. She has not had  a previous visit with a dietitian. She rarely participates in exercise. Her home blood glucose trend is increasing steadily. (She did not bring her logs nor meter once again.  She is accompanied by her daughter who is offering to help.  Her most recent A1c was 10.5%.  She denies hypoglycemia, insists that she is injecting 80 units of Novolin 70/30 twice a day.  ) An ACE inhibitor/angiotensin II receptor blocker is being taken. Eye exam is current.  Hypertension This is a chronic problem. The current episode started more than 1 year ago. The problem is controlled. Associated symptoms include blurred vision. Pertinent negatives include no chest pain, headaches, palpitations or shortness of breath. Risk factors for coronary artery disease include dyslipidemia, diabetes mellitus, obesity, sedentary lifestyle, smoking/tobacco exposure and family history. Past treatments include calcium channel blockers. Hypertensive end-organ damage includes CAD/MI.  Hyperlipidemia This is a chronic problem. The current episode started more than 1 year ago. Exacerbating diseases include diabetes,  hypothyroidism and obesity. Pertinent negatives include no chest pain, myalgias or shortness of breath. Risk factors for coronary artery disease include diabetes mellitus, dyslipidemia, family history, obesity, hypertension, a sedentary lifestyle and post-menopausal.    Review of systems  Constitutional: + Progressive weight loss,  current  Body mass index is 49.29 kg/m. , no fatigue, no subjective hyperthermia, no subjective hypothermia Eyes: no blurry vision, no xerophthalmia ENT: no sore throat, no nodules palpated in throat, no dysphagia/odynophagia, no hoarseness Cardiovascular: no Chest Pain, no Shortness of Breath, no palpitations, no leg swelling Respiratory: no cough, no shortness of breath Gastrointestinal: no Nausea/Vomiting/Diarhhea Musculoskeletal: no muscle/joint aches Skin: no rashes, no hyperemia Neurological: no tremors, no numbness, no tingling, no dizziness Psychiatric: no depression, no anxiety   Objective:    BP 131/88   Pulse (!) 106   Ht 5' 5" (1.651 m)   Wt 296 lb 3.2 oz (134.4 kg)   BMI 49.29 kg/m   Wt Readings from Last 3 Encounters:  03/10/20 296 lb 3.2 oz (134.4 kg)  02/18/20 292 lb 6.4 oz (132.6 kg)  02/06/19 (!) 319 lb (144.7 kg)     Physical Exam- Limited  Constitutional:  Body mass index is 49.29 kg/m. , not in acute distress, normal state of mind Eyes:  EOMI, no exophthalmos Neck: Supple Thyroid: No gross goiter Respiratory: Adequate breathing efforts Musculoskeletal: no gross deformities, strength intact in all four extremities, no gross restriction of joint movements Skin:  no rashes, no hyperemia Neurological: no tremor with outstretched hands,    CMP ( most recent) CMP     Component Value Date/Time   NA 135 02/18/2020 1146   K 4.3 02/18/2020 1146   CL 95 (L) 02/18/2020 1146   CO2 30 02/18/2020 1146   GLUCOSE 396 (H) 02/18/2020 1146   BUN 20 02/18/2020 1146   CREATININE 1.12 (H) 02/18/2020 1146   CALCIUM 9.9 02/18/2020 1146    PROT 6.9 02/18/2020 1146   ALBUMIN 3.0 (L) 07/17/2018 0514   AST 14 02/18/2020 1146   ALT 15 02/18/2020 1146   ALKPHOS 75 07/17/2018 0514   BILITOT 0.6 02/18/2020 1146   GFRNONAA 54 (L) 02/18/2020 1146   GFRAA 62 02/18/2020 1146     Diabetic Labs (most recent): Lab Results  Component Value Date   HGBA1C 10.5 (A) 02/18/2020   HGBA1C 9.6 (H) 05/21/2019   HGBA1C 9.7 (H) 01/30/2019     Lipid Panel ( most recent) Lipid Panel     Component Value Date/Time   CHOL 251 (H)  01/30/2019 0934   TRIG 243 (H) 01/30/2019 0934   HDL 47 (L) 01/30/2019 0934   CHOLHDL 5.3 (H) 01/30/2019 0934   VLDL UNABLE TO CALCULATE IF TRIGLYCERIDE OVER 400 mg/dL 06/28/2009 0611   LDLCALC 163 (H) 01/30/2019 0934     Assessment & Plan:   1. DM type 2 causing vascular disease ([place])  - [name] has currently uncontrolled symptomatic type 2 DM since 59 years of age. She did not bring her logs nor meter once again.  She is accompanied by her daughter who is offering to help.  Her most recent A1c was 10.5%.       -her diabetes is complicated by coronary artery disease status post triple bypass in November 2019, morbid obesity, history of smoking, sedentary life and she remains at a high risk for more acute and chronic complications which include CAD, CVA, CKD, retinopathy, and neuropathy. These are all discussed in detail with her.  - I have counseled her on diet management and weight loss, by adopting a carbohydrate restricted/protein rich diet.  - she  admits there is a room for improvement in her diet and drink choices. -  Suggestion is made for her to avoid simple carbohydrates  from her diet including Cakes, Sweet Desserts / Pastries, Ice Cream, Soda (diet and regular), Sweet Tea, Candies, Chips, Cookies, Sweet Pastries,  Store Bought Juices, Alcohol in Excess of  1-2 drinks a day, Artificial Sweeteners, Coffee Creamer, and "Sugar-free" Products. This will help patient to have stable blood  glucose profile and potentially avoid unintended weight gain.   - I encouraged her to switch to  unprocessed or minimally processed complex starch and increased protein intake (animal or plant source), fruits, and vegetables.  - she is advised to stick to a routine mealtimes to eat 3 meals  a day and avoid unnecessary snacks ( to snack only to correct hypoglycemia).   - she will be scheduled with Jearld Fenton, RDN, CDE for individualized diabetes education.  - I have approached her with the following individualized plan to manage diabetes and patient agrees:   -Based on her current glycemic burden as well as her comorbidities, she would need intensive treatment with basal/bolus insulin in order for her to achieve control of diabetes to target. -However, she did not engage properly for safe use of insulin.  Her daughter accompanying her to clinic offers to help.  I had a long discussion with her regarding the need for diabetes control.    -For simplicity reasons, she will be kept on premixed insulin for ease of use.   -She advised to continue Novolin 70/30 80 units with breakfast and 80 units with supper when premeal blood glucose readings are above 90 mg per DL, associated with strict monitoring of blood glucose 4 times daily-before meals and at bedtime.   -This patient will benefit from CGM.  A prescription was sent to her dispenser, she has not heard anything yet.  - she is encouraged to call clinic for blood glucose levels less than 70 or above 200 mg /dl. - she is advised to continue glipizide 10 mg p.o. twice daily with breakfast and supper, - she will benefit from incretin therapy.  I discussed and added Trulicity 7.41 mg subcutaneously weekly.   - Patient specific target  A1c;  LDL, HDL, Triglycerides, and  Waist Circumference were discussed in detail.  2) Blood Pressure /Hypertension:  -Her blood pressure is controlled to target.   she is advised to continue  her current  medications including metoprolol 25 mg p.o. daily, amlodipine 66m p.o. daily with breakfast .  She also has Lasix as needed.  She will be considered for low-dose ACE inhibitors on subsequent visits.  3) Lipids/Hyperlipidemia:   Review of her recent lipid panel showed uncontrolled triglyceridemia greater than 700.  She is advised to continue Lipitor 80 mg p.o. daily at bedtime.    -  Side effects and precautions discussed with her.  4)  Weight/Diet:  She is obese-we will get BMI of 49.29 -   clearly complicating her diabetes care.  She is a candidate for modest weight loss.  I discussed with her the fact that loss of 5 - 10% of her  current body weight will have the most impact on her diabetes management.  CDE Consult will be initiated . Exercise, and detailed carbohydrates information provided  -  detailed on discharge instructions.  5) hypothyroidism: She is currently on levothyroxine 50 mcg p.o. daily before breakfast.   Her recent thyroid function tests were consistent with appropriate replacement.  - We discussed about the correct intake of her thyroid hormone, on empty stomach at fasting, with water, separated by at least 30 minutes from breakfast and other medications,  and separated by more than 4 hours from calcium, iron, multivitamins, acid reflux medications (PPIs). -Patient is made aware of the fact that thyroid hormone replacement is needed for life, dose to be adjusted by periodic monitoring of thyroid function tests.   6) vitamin D  Deficiency --She is status post treatment with vitamin D 3 5000 units daily for the next 90 days.   7) Chronic Care/Health Maintenance:  -she  is on  Statin medications and  is encouraged to initiate and continue to follow up with Ophthalmology, Dentist,  Podiatrist at least yearly or according to recommendations, and advised to   stay away from smoking. I have recommended yearly flu vaccine and pneumonia vaccine at least every 5 years; moderate  intensity exercise for up to 150 minutes weekly; and  sleep for at least 7 hours a day.  - she is  advised to maintain close follow up with WChristain Sacramento MD for primary care needs, as well as her other providers for optimal and coordinated care.  - Time spent on this patient care encounter:  35 min, of which > 50% was spent in  counseling and the rest reviewing her blood glucose logs , discussing her hypoglycemia and hyperglycemia episodes, reviewing her current and  previous labs / studies  ( including abstraction from other facilities) and medications  doses and developing a  long term treatment plan and documenting her care.   Please refer to Patient Instructions for Blood Glucose Monitoring and Insulin/Medications Dosing Guide"  in media tab for additional information. Please  also refer to " Patient Self Inventory" in the Media  tab for reviewed elements of pertinent patient history.  Maria Roparticipated in the discussions, expressed understanding, and voiced agreement with the above plans.  All questions were answered to her satisfaction. she is encouraged to contact clinic should she have any questions or concerns prior to her return visit.   Follow up plan: - Return in about 3 months (around 06/10/2020) for Bring Meter and Logs- A1c in Office.  GGlade Lloyd MD [facility]RR[address] [place] [address]Phone: [phone] Fax: [phone]   03/10/2020, 5:54 PM  This note was partially dictated with  voice recognition software. Similar sounding words can be transcribed inadequately or may not  be corrected upon review.

## 2020-03-10 NOTE — Patient Instructions (Signed)

## 2020-03-12 ENCOUNTER — Telehealth: Payer: Self-pay | Admitting: "Endocrinology

## 2020-03-12 MED ORDER — TRULICITY 0.75 MG/0.5ML ~~LOC~~ SOAJ: 0.7500 mg | SUBCUTANEOUS | 2 refills | Status: DC

## 2020-03-12 MED ORDER — GLUCOSE BLOOD VI STRP: 1.0000 | ORAL_STRIP | Freq: Four times a day (QID) | 3 refills | Status: DC

## 2020-03-12 NOTE — Telephone Encounter (Signed)
Patient states that her Trulicity was called in quartly but its cheaper to be called in monthly. Can this be changed to a month to month script. [facility]. She also needs test strips sent in.

## 2020-03-12 NOTE — Telephone Encounter (Signed)
Rxs sent

## 2020-03-17 ENCOUNTER — Telehealth: Payer: Self-pay | Admitting: "Endocrinology

## 2020-03-17 MED ORDER — TRULICITY 0.75 MG/0.5ML ~~LOC~~ SOAJ: 0.7500 mg | SUBCUTANEOUS | 2 refills | Status: DC

## 2020-03-17 NOTE — Telephone Encounter (Signed)
Called [employer] and cancelled prescription for Trulicity.

## 2020-03-17 NOTE — Telephone Encounter (Signed)
Rx refill sent to [facility].

## 2020-03-17 NOTE — Telephone Encounter (Signed)
Pt called and states she does not understand why she can not get the following medication through [facility] pharmacy and would like it canceled and sent to [facility].   Dulaglutide (TRULICITY) 0.75 MG/0.5ML SOPN   [facility] - [place], [place] - [address] Phone:  [phone]  Fax:  [phone]

## 2020-03-17 NOTE — Telephone Encounter (Signed)
Pt left a VM that only half the order was canceled with [facility] and she needs it all canceled. She would like a call back

## 2020-03-28 ENCOUNTER — Other Ambulatory Visit: Payer: Self-pay | Admitting: "Endocrinology

## 2020-04-07 ENCOUNTER — Telehealth: Payer: Self-pay | Admitting: "Endocrinology

## 2020-04-07 NOTE — Telephone Encounter (Signed)
[facility] called and left a VM that they are unable to reach the pt for her [place].

## 2020-04-09 NOTE — Telephone Encounter (Signed)
Made pt aware, gave her [facility] telephone number so she can call them.

## 2020-05-14 ENCOUNTER — Other Ambulatory Visit: Payer: Self-pay | Admitting: "Endocrinology

## 2020-06-10 ENCOUNTER — Ambulatory Visit: Payer: Medicare HMO | Admitting: Nurse Practitioner

## 2020-06-16 ENCOUNTER — Other Ambulatory Visit: Payer: Self-pay | Admitting: "Endocrinology

## 2020-07-07 ENCOUNTER — Other Ambulatory Visit: Payer: Self-pay | Admitting: "Endocrinology

## 2020-07-28 ENCOUNTER — Other Ambulatory Visit: Payer: Self-pay | Admitting: "Endocrinology

## 2020-08-27 ENCOUNTER — Other Ambulatory Visit: Payer: Self-pay | Admitting: "Endocrinology

## 2020-09-23 ENCOUNTER — Telehealth: Payer: Self-pay

## 2020-09-23 ENCOUNTER — Other Ambulatory Visit: Payer: Self-pay | Admitting: "Endocrinology

## 2020-09-23 ENCOUNTER — Ambulatory Visit: Payer: Medicare HMO | Admitting: "Endocrinology

## 2020-09-23 MED ORDER — NOVOLIN 70/30 FLEXPEN (70-30) 100 UNIT/ML ~~LOC~~ SUPN: PEN_INJECTOR | SUBCUTANEOUS | 0 refills | Status: DC

## 2020-09-23 NOTE — Telephone Encounter (Signed)
Patient needs refill on Novolin today.  She has appt on 10/16/20 and can not get a ride prior to 10/16/20.

## 2020-09-23 NOTE — Telephone Encounter (Signed)
Rx sent to [facility].  

## 2020-10-09 ENCOUNTER — Other Ambulatory Visit: Payer: Self-pay

## 2020-10-09 MED ORDER — METOPROLOL SUCCINATE ER 25 MG PO TB24: 25.0000 mg | ORAL_TABLET | Freq: Every day | ORAL | 0 refills | Status: DC

## 2020-10-09 NOTE — Telephone Encounter (Signed)
Last seen 2019, per B.Strader, PA-C, refill 30 day supply toprol, call pt to make f/u apt.Microbiologist aware.

## 2020-10-16 ENCOUNTER — Ambulatory Visit: Payer: Medicare HMO | Admitting: Nurse Practitioner

## 2020-10-28 ENCOUNTER — Ambulatory Visit: Payer: Medicare HMO | Admitting: Physician Assistant

## 2020-11-04 ENCOUNTER — Ambulatory Visit: Payer: Medicare HMO | Admitting: Nurse Practitioner

## 2020-11-06 ENCOUNTER — Other Ambulatory Visit: Payer: Self-pay

## 2020-11-06 ENCOUNTER — Encounter: Payer: Self-pay | Admitting: Nurse Practitioner

## 2020-11-06 ENCOUNTER — Ambulatory Visit: Payer: Medicare HMO | Admitting: Nurse Practitioner

## 2020-11-06 VITALS — BP 133/83 | HR 74 | Ht 65.0 in | Wt 276.6 lb

## 2020-11-06 DIAGNOSIS — E782 Mixed hyperlipidemia: Secondary | ICD-10-CM | POA: Diagnosis not present

## 2020-11-06 DIAGNOSIS — E559 Vitamin D deficiency, unspecified: Secondary | ICD-10-CM

## 2020-11-06 DIAGNOSIS — E1159 Type 2 diabetes mellitus with other circulatory complications: Secondary | ICD-10-CM

## 2020-11-06 DIAGNOSIS — I10 Essential (primary) hypertension: Secondary | ICD-10-CM

## 2020-11-06 DIAGNOSIS — E039 Hypothyroidism, unspecified: Secondary | ICD-10-CM | POA: Diagnosis not present

## 2020-11-06 LAB — POCT UA - MICROALBUMIN: Microalbumin Ur, POC: 30 mg/L

## 2020-11-06 LAB — POCT GLYCOSYLATED HEMOGLOBIN (HGB A1C): HbA1c, POC (controlled diabetic range): 9.1 % — AB (ref 0.0–7.0)

## 2020-11-06 NOTE — Patient Instructions (Signed)

## 2020-11-06 NOTE — Progress Notes (Signed)
11/06/2020   Endocrinology follow-up note   Subjective:    Patient ID: [name], female    DOB: 1961/06/15.  [name] is being seen in follow-up  for management of currently uncontrolled symptomatic diabetes requested by  Christain Sacramento, MD.   Past Medical History:  Diagnosis Date  . Asthma   . Back pain   . CAD (coronary artery disease)    a. 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   . Chest pain   . Diabetes mellitus type II   . Headache   . High cholesterol   . Hypertension   . Obesity, morbid ([place])   . Other and unspecified bipolar disorders   . 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 Thyroid disease     Past Surgical History:  Procedure Laterality Date  . TUBAL LIGATION     Bilateral    Social History   Socioeconomic History  . Marital status: Widowed    Spouse name: Not on file  . Number of children: Not on file  . Years of education: Not on file  . Highest education level: Not on file  Occupational History  . Occupation: Disabled    Employer: UNEMPLOYED  Tobacco Use  . Smoking status: Never Smoker  . Smokeless tobacco: Never Used  Vaping Use  . Vaping Use: Never used  Substance and Sexual Activity  . Alcohol use: No    Alcohol/week: 0.0 standard drinks  . Drug use: No    Comment: 05/13/2016 per pt no  . Sexual activity: Yes    Birth control/protection: Surgical  Other Topics Concern  . Not on file  Social History Narrative   Married   No regular exercise   Social Determinants of Health   Financial Resource Strain: Not on file  Food Insecurity: Not on file  Transportation Needs: Not on file  Physical Activity: Not on file  Stress: Not on file  Social Connections: Not on file    Family History  Problem Relation Age of Onset  . Heart attack Maternal Uncle   . Depression  Maternal Uncle   . Depression Mother   . Alcohol abuse Maternal Grandfather   . Alcohol abuse Maternal Uncle   . Depression Maternal Grandmother     Outpatient Encounter Medications as of 11/06/2020  Medication Sig  . albuterol (PROVENTIL HFA;VENTOLIN HFA) 108 (90 Base) MCG/ACT inhaler Inhale 2 puffs into the lungs every 6 (six) hours as needed for wheezing or shortness of breath.  Marland Kitchen amLODipine (NORVASC) 10 MG tablet Take 1 tablet (10 mg total) by mouth daily. For high blood pressure  . atorvastatin (LIPITOR) 80 MG tablet Take 1 tablet (80 mg total) by mouth every morning. (Patient taking differently: Take 80 mg by mouth daily at 6 PM.)  . BD PEN NEEDLE NANO U/F 32G X 4 MM MISC USE AS DIRECTED TWICE DAILY  . blood glucose meter kit and supplies KIT 1 each by Does not apply route 4 (four) times daily. Dispense based on patient and insurance preference. Use up to four times daily as directed. (FOR ICD-9 250.00, 250.01).  . busPIRone (BUSPAR) 30 MG tablet Take 1 tablet (30 mg total) by mouth 3 (three) times daily.  . Cholecalciferol (VITAMIN D3) 125 MCG (5000 UT) CAPS Take 1 capsule (5,000 Units total) by mouth daily.  . clopidogrel (PLAVIX) 75 MG tablet TAKE 1  TABLET EVERY DAY  . escitalopram (LEXAPRO) 20 MG tablet Take 1 tablet (20 mg total) by mouth 3 (three) times daily. for mood control  . furosemide (LASIX) 20 MG tablet TAKE 1 TABLET (20 MG TOTAL) BY MOUTH DAILY.  Marland Kitchen glipiZIDE (GLUCOTROL) 10 MG tablet Take 1 tablet (10 mg total) by mouth 2 (two) times daily before a meal.  . glucose blood test strip 1 each by Other route 4 (four) times daily. Use as instructed to test blood glucose four times a day  . hydrOXYzine (ATARAX/VISTARIL) 50 MG tablet Take 1 tablet (50 mg total) by mouth every 6 (six) hours as needed for anxiety.  . insulin isophane & regular human (NOVOLIN 70/30 FLEXPEN) (70-30) 100 UNIT/ML KwikPen INJECT 80 UNITS INTO THE SKIN AT BREAKFAST AND 80 UNITS AT DINNER IN THE EVENING.  Marland Kitchen  levothyroxine (SYNTHROID, LEVOTHROID) 50 MCG tablet Take 1 tablet (50 mcg total) by mouth daily before breakfast. For hypothyroidism  . metoprolol succinate (TOPROL-XL) 25 MG 24 hr tablet Take 1 tablet (25 mg total) by mouth daily.  . nitroGLYCERIN (NITROSTAT) 0.4 MG SL tablet Place under the tongue.  Marland Kitchen Potassium Chloride ER 20 MEQ TBCR TAKE 1 TABLET EVERY DAY  . risperiDONE (RISPERDAL) 2 MG tablet Take 1 tablet (2 mg total) by mouth 2 (two) times daily.  . traZODone (DESYREL) 100 MG tablet Take 1 tablet (100 mg total) by mouth at bedtime.  . Dulaglutide (TRULICITY) 0.17 PZ/0.2HE SOPN Inject 0.5 mLs (0.75 mg total) into the skin once a week.  . pantoprazole (PROTONIX) 40 MG tablet Take 1 tablet (40 mg total) by mouth daily.  . rizatriptan (MAXALT) 5 MG tablet Take by mouth.  . [DISCONTINUED] Continuous Blood Gluc Sensor (FREESTYLE LIBRE 14 DAY SENSOR) MISC Inject 1 each into the skin every 14 (fourteen) days. Use as directed.   No facility-administered encounter medications on file as of 11/06/2020.    ALLERGIES: Allergies  Allergen Reactions  . Amoxicillin Swelling and Other (See Comments)    Oral swelling   . Doxycycline Nausea And Vomiting  . Levofloxacin Other (See Comments)    headache  . Propoxyphene N-Acetaminophen Nausea And Vomiting    VACCINATION STATUS: Immunization History  Administered Date(s) Administered  . Influenza,inj,Quad PF,6+ Mos 07/18/2018  . Pneumococcal Polysaccharide-23 07/18/2018    Diabetes She presents for her follow-up diabetic visit. She has type 2 diabetes mellitus. Onset time: She was diagnosed at approximate age of 60 years. Her disease course has been worsening. There are no hypoglycemic associated symptoms. Pertinent negatives for hypoglycemia include no confusion, headaches, pallor, seizures or tremors. Associated symptoms include blurred vision, fatigue, polydipsia and polyuria. Pertinent negatives for diabetes include no chest pain and no  polyphagia. There are no hypoglycemic complications. Symptoms are worsening. Diabetic complications include heart disease. (Status post triple coronary bypass in November 2019.) Risk factors for coronary artery disease include diabetes mellitus, dyslipidemia, family history, hypertension, obesity, sedentary lifestyle and tobacco exposure. Current diabetic treatment includes insulin injections and oral agent (monotherapy). Her weight is decreasing steadily. She is following a generally unhealthy diet. When asked about meal planning, she reported none. She has not had a previous visit with a dietitian. She rarely participates in exercise. (She presents today with no meter or logs to review.  She reports she does not monitor her glucose routinely.  Her POCT A1c today is 9.1%, improving from last visit of 10.5%.  She has lost 20+ lbs since last visit.  She says she only eats 1  meal per day.  She does report some s/s of hypoglycemia at times (dizziness). ) An ACE inhibitor/angiotensin II receptor blocker is being taken. She does not see a podiatrist.Eye exam is current.  Hypertension This is a chronic problem. The current episode started more than 1 year ago. The problem is controlled. Associated symptoms include blurred vision. Pertinent negatives include no chest pain, headaches, palpitations or shortness of breath. Risk factors for coronary artery disease include dyslipidemia, diabetes mellitus, obesity, sedentary lifestyle, smoking/tobacco exposure and family history. Past treatments include calcium channel blockers. Hypertensive end-organ damage includes CAD/MI.  Hyperlipidemia This is a chronic problem. The current episode started more than 1 year ago. Exacerbating diseases include diabetes, hypothyroidism and obesity. Pertinent negatives include no chest pain, myalgias or shortness of breath. Risk factors for coronary artery disease include diabetes mellitus, dyslipidemia, family history, obesity,  hypertension, a sedentary lifestyle and post-menopausal.    Review of systems  Constitutional: +steadily decreasing body weight (lost 20+ lbs since last June),  current Body mass index is 46.03 kg/m. , no fatigue, no subjective hyperthermia, no subjective hypothermia Eyes: no blurry vision, no xerophthalmia ENT: no sore throat, no nodules palpated in throat, no dysphagia/odynophagia, no hoarseness Cardiovascular: no chest pain, no shortness of breath, no palpitations, no leg swelling Respiratory: no cough, no shortness of breath Gastrointestinal: no nausea/vomiting/diarrhea Musculoskeletal: no muscle/joint aches Skin: no rashes, no hyperemia Neurological: no tremors, no numbness, no tingling, no dizziness Psychiatric: no depression, no anxiety   Objective:    BP 133/83 (BP Location: Left Arm, Patient Position: Sitting)   Pulse 74   Ht 5' 5"  (1.651 m)   Wt 276 lb 9.6 oz (125.5 kg)   BMI 46.03 kg/m   Wt Readings from Last 3 Encounters:  11/06/20 276 lb 9.6 oz (125.5 kg)  03/10/20 296 lb 3.2 oz (134.4 kg)  02/18/20 292 lb 6.4 oz (132.6 kg)    BP Readings from Last 3 Encounters:  11/06/20 133/83  03/10/20 131/88  02/18/20 128/83    Physical Exam- Limited  Constitutional:  Body mass index is 46.03 kg/m. , not in acute distress, normal state of mind Eyes:  EOMI, no exophthalmos Neck: Supple Cardiovascular: RRR, no murmers, rubs, or gallops, no edema Respiratory: Adequate breathing efforts, no crackles, rales, rhonchi, or wheezing Musculoskeletal: no gross deformities, strength intact in all four extremities, no gross restriction of joint movements Skin:  no rashes, no hyperemia Neurological: no tremor with outstretched hands     CMP ( most recent) CMP     Component Value Date/Time   NA 135 02/18/2020 1146   K 4.3 02/18/2020 1146   CL 95 (L) 02/18/2020 1146   CO2 30 02/18/2020 1146   GLUCOSE 396 (H) 02/18/2020 1146   BUN 20 02/18/2020 1146   CREATININE 1.12 (H)  02/18/2020 1146   CALCIUM 9.9 02/18/2020 1146   PROT 6.9 02/18/2020 1146   ALBUMIN 3.0 (L) 07/17/2018 0514   AST 14 02/18/2020 1146   ALT 15 02/18/2020 1146   ALKPHOS 75 07/17/2018 0514   BILITOT 0.6 02/18/2020 1146   GFRNONAA 54 (L) 02/18/2020 1146   GFRAA 62 02/18/2020 1146     Diabetic Labs (most recent): Lab Results  Component Value Date   HGBA1C 9.1 (A) 11/06/2020   HGBA1C 10.5 (A) 02/18/2020   HGBA1C 9.6 (H) 05/21/2019     Lipid Panel ( most recent) Lipid Panel     Component Value Date/Time   CHOL 251 (H) 01/30/2019 0934   TRIG 243 (  H) 01/30/2019 0934   HDL 47 (L) 01/30/2019 0934   CHOLHDL 5.3 (H) 01/30/2019 0934   VLDL UNABLE TO CALCULATE IF TRIGLYCERIDE OVER 400 mg/dL 06/28/2009 0611   LDLCALC 163 (H) 01/30/2019 0934     Assessment & Plan:   1) DM type 2 causing vascular disease ([name])  - [name] has currently uncontrolled symptomatic type 2 DM since 60 years of age.  She presents today, accompanied by her daughter, with no meter or logs to review.  She reports she does not monitor her glucose routinely.  Her POCT A1c today is 9.1%, improving from last visit of 10.5%.  She has lost 20+ lbs since last visit.  She says she only eats 1 meal per day.  She does report some s/s of hypoglycemia at times (dizziness).    -her diabetes is complicated by coronary artery disease status post triple bypass in November 2019, morbid obesity, history of smoking, sedentary life and she remains at a high risk for more acute and chronic complications which include CAD, CVA, CKD, retinopathy, and neuropathy. These are all discussed in detail with her.  - Nutritional counseling repeated at each appointment due to patients tendency to fall back in to old habits.  - The patient admits there is a room for improvement in their diet and drink choices. -  Suggestion is made for the patient to avoid simple carbohydrates from their diet including Cakes, Sweet Desserts / Pastries,  Ice Cream, Soda (diet and regular), Sweet Tea, Candies, Chips, Cookies, Sweet Pastries,  Store Bought Juices, Alcohol in Excess of  1-2 drinks a day, Artificial Sweeteners, Coffee Creamer, and "Sugar-free" Products. This will help patient to have stable blood glucose profile and potentially avoid unintended weight gain.   - I encouraged the patient to switch to  unprocessed or minimally processed complex starch and increased protein intake (animal or plant source), fruits, and vegetables.   - Patient is advised to stick to a routine mealtimes to eat 3 meals  a day and avoid unnecessary snacks ( to snack only to correct hypoglycemia).  - I have approached her with the following individualized plan to manage diabetes and patient agrees:   -Based on her current glycemic burden as well as her comorbidities, she would need intensive treatment with basal/bolus insulin in order for her to achieve control of diabetes to target.  -However, she did not engage properly for safe use of insulin.  Her daughter accompanying her to clinic offers to help.  I had a long discussion with her regarding the need for diabetes control.  For simplicity reasons, she will be kept on premixed insulin for ease of use.  We also discussed the need to eat 3 meals routinely.  -Given the lack of meter or logs to review, no changes will be made to her medication today for safety purposes.  She is advised to continue Novolin 70/30 80 units BID with meals if glucose is above 90 and she is eating.  She can also continue her Glipizide 10 mg twice daily with meals.   -She is encouraged to start monitoring glucose at least 3 times per day (before she injects insulin at breakfast and supper, and before bed), and to call the clinic if she has readings less than 70 or greater than 300 for 3 tests in a row. I am having her return in 2 weeks with meter and logs so that adjustments can be made to her medications safely to regain control of  her  diabetes.  -She could not afford the copay for Trulicity.  - Patient specific target  A1c;  LDL, HDL, Triglycerides, and  Waist Circumference were discussed in detail.  2) Blood Pressure /Hypertension:  -Her blood pressure is controlled to target.  She is advised to continue Norvasc 10 mg po daily, Lasix 20 mg po daily, and Metoprolol 25 mg po daily.  3) Lipids/Hyperlipidemia:   Her most recent lipid panel from 01/30/19 shows uncontrolled LDL of 163 and elevated triglycerides of 243.  She is advised to continue Lipitor 80 mg po daily at bedtime.  Side effects and precautions discussed with her.  Will recheck lipid panel prior to next visit.  4)  Weight/Diet:   Her Body mass index is 46.03 kg/m. -   clearly complicating her diabetes care.  She is a candidate for modest weight loss.  I discussed with her the fact that loss of 5 - 10% of her  current body weight will have the most impact on her diabetes management.  CDE Consult will be initiated . Exercise, and detailed carbohydrates information provided  -  detailed on discharge instructions.  5) Hypothyroidism: There are no recent thyroid function tests to review.  She is advised to continue Levothyroxine 50 mcg po daily before breakfast.  Will recheck TFTs prior to next visit and adjust dose if needed.    Her recent thyroid function tests were consistent with appropriate replacement.  - We discussed about the correct intake of her thyroid hormone, on empty stomach at fasting, with water, separated by at least 30 minutes from breakfast and other medications,  and separated by more than 4 hours from calcium, iron, multivitamins, acid reflux medications (PPIs). -Patient is made aware of the fact that thyroid hormone replacement is needed for life, dose to be adjusted by periodic monitoring of thyroid function tests.  6) Vitamin D  Deficiency -Her most recent vitamin D level on 01/30/19 was 12.  She is not currently taking any supplementation.  Will  recheck vitamin D level prior to next visit.  7) Chronic Care/Health Maintenance: -she is on Statin medications and  is encouraged to initiate and continue to follow up with Ophthalmology, Dentist,  Podiatrist at least yearly or according to recommendations, and advised to stay away from smoking. I have recommended yearly flu vaccine and pneumonia vaccine at least every 5 years; moderate intensity exercise for up to 150 minutes weekly; and  sleep for at least 7 hours a day.  - she is advised to maintain close follow up with Christain Sacramento, MD for primary care needs, as well as her other providers for optimal and coordinated care.  - Time spent on this patient care encounter:  40 min, of which > 50% was spent in  counseling and the rest reviewing her blood glucose logs , discussing her hypoglycemia and hyperglycemia episodes, reviewing her current and  previous labs / studies  ( including abstraction from other facilities) and medications  doses and developing a  long term treatment plan and documenting her care.   Please refer to Patient Instructions for Blood Glucose Monitoring and Insulin/Medications Dosing Guide"  in media tab for additional information. Please  also refer to " Patient Self Inventory" in the Media  tab for reviewed elements of pertinent patient history.  [name] participated in the discussions, expressed understanding, and voiced agreement with the above plans.  All questions were answered to her satisfaction. she is encouraged to contact clinic should  she have any questions or concerns prior to her return visit.    Follow up plan: - Return in about 2 weeks (around 11/20/2020) for Diabetes follow up, Bring glucometer and logs, Previsit labs.  Rayetta Pigg, [facility] [address] [place], [place] [address] Phone: [phone] Fax: [phone]   11/06/2020, 3:22 PM

## 2020-11-07 LAB — COMPREHENSIVE METABOLIC PANEL
ALT: 13 IU/L (ref 0–32)
AST: 14 IU/L (ref 0–40)
Albumin/Globulin Ratio: 1.5 (ref 1.2–2.2)
Albumin: 4.1 g/dL (ref 3.8–4.9)
Alkaline Phosphatase: 91 IU/L (ref 44–121)
BUN/Creatinine Ratio: 21 (ref 12–28)
BUN: 23 mg/dL (ref 8–27)
Bilirubin Total: 0.4 mg/dL (ref 0.0–1.2)
CO2: 27 mmol/L (ref 20–29)
Calcium: 9.7 mg/dL (ref 8.7–10.3)
Chloride: 92 mmol/L — ABNORMAL LOW (ref 96–106)
Creatinine, Ser: 1.12 mg/dL — ABNORMAL HIGH (ref 0.57–1.00)
GFR calc Af Amer: 62 mL/min/{1.73_m2} (ref >59)
GFR calc non Af Amer: 54 mL/min/{1.73_m2} — ABNORMAL LOW (ref >59)
Globulin, Total: 2.8 g/dL (ref 1.5–4.5)
Glucose: 339 mg/dL — ABNORMAL HIGH (ref 65–99)
Potassium: 4.4 mmol/L (ref 3.5–5.2)
Sodium: 137 mmol/L (ref 134–144)
Total Protein: 6.9 g/dL (ref 6.0–8.5)

## 2020-11-07 LAB — TSH: TSH: 1.75 u[IU]/mL (ref 0.450–4.500)

## 2020-11-07 LAB — LIPID PANEL
Chol/HDL Ratio: 8 ratio — ABNORMAL HIGH (ref 0.0–4.4)
Cholesterol, Total: 350 mg/dL — ABNORMAL HIGH (ref 100–199)
HDL: 44 mg/dL (ref >39)
LDL Chol Calc (NIH): 243 mg/dL — ABNORMAL HIGH (ref 0–99)
Triglycerides: 297 mg/dL — ABNORMAL HIGH (ref 0–149)
VLDL Cholesterol Cal: 63 mg/dL — ABNORMAL HIGH (ref 5–40)

## 2020-11-07 LAB — VITAMIN D 25 HYDROXY (VIT D DEFICIENCY, FRACTURES): Vit D, 25-Hydroxy: 14.8 ng/mL — ABNORMAL LOW (ref 30.0–100.0)

## 2020-11-07 LAB — T4, FREE: Free T4: 1.32 ng/dL (ref 0.82–1.77)

## 2020-11-11 ENCOUNTER — Other Ambulatory Visit: Payer: Self-pay | Admitting: "Endocrinology

## 2020-11-20 ENCOUNTER — Other Ambulatory Visit: Payer: Self-pay

## 2020-11-20 ENCOUNTER — Ambulatory Visit: Payer: Medicare HMO | Admitting: Nurse Practitioner

## 2020-11-20 ENCOUNTER — Encounter: Payer: Self-pay | Admitting: Nurse Practitioner

## 2020-11-20 VITALS — BP 148/91 | HR 83 | Ht 65.0 in | Wt 284.0 lb

## 2020-11-20 DIAGNOSIS — E1159 Type 2 diabetes mellitus with other circulatory complications: Secondary | ICD-10-CM

## 2020-11-20 DIAGNOSIS — E039 Hypothyroidism, unspecified: Secondary | ICD-10-CM | POA: Diagnosis not present

## 2020-11-20 DIAGNOSIS — I10 Essential (primary) hypertension: Secondary | ICD-10-CM

## 2020-11-20 DIAGNOSIS — E559 Vitamin D deficiency, unspecified: Secondary | ICD-10-CM

## 2020-11-20 DIAGNOSIS — E782 Mixed hyperlipidemia: Secondary | ICD-10-CM

## 2020-11-20 MED ORDER — NOVOLIN 70/30 FLEXPEN (70-30) 100 UNIT/ML ~~LOC~~ SUPN: 80.0000 [IU] | PEN_INJECTOR | Freq: Two times a day (BID) | SUBCUTANEOUS | 6 refills | Status: DC

## 2020-11-20 MED ORDER — ROSUVASTATIN CALCIUM 40 MG PO TABS: 40.0000 mg | ORAL_TABLET | Freq: Every day | ORAL | 3 refills | Status: DC

## 2020-11-20 MED ORDER — VITAMIN D (ERGOCALCIFEROL) 1.25 MG (50000 UNIT) PO CAPS: 50000.0000 [IU] | ORAL_CAPSULE | ORAL | 0 refills | Status: AC

## 2020-11-20 NOTE — Progress Notes (Signed)
11/20/2020   Endocrinology follow-up note   Subjective:    Patient ID: [name], female    DOB: 10-18-1960.  [name] is being seen in follow-up  for management of currently uncontrolled symptomatic diabetes requested by  Christain Sacramento, MD.   Past Medical History:  Diagnosis Date  . Asthma   . Back pain   . CAD (coronary artery disease)    a. 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   . Chest pain   . Diabetes mellitus type II   . Headache   . High cholesterol   . Hypertension   . Obesity, morbid ([place])   . Other and unspecified bipolar disorders   . 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 Thyroid disease     Past Surgical History:  Procedure Laterality Date  . TUBAL LIGATION     Bilateral    Social History   Socioeconomic History  . Marital status: Widowed    Spouse name: Not on file  . Number of children: Not on file  . Years of education: Not on file  . Highest education level: Not on file  Occupational History  . Occupation: Disabled    Employer: UNEMPLOYED  Tobacco Use  . Smoking status: Never Smoker  . Smokeless tobacco: Never Used  Vaping Use  . Vaping Use: Never used  Substance and Sexual Activity  . Alcohol use: No    Alcohol/week: 0.0 standard drinks  . Drug use: No    Comment: 05/13/2016 per pt no  . Sexual activity: Yes    Birth control/protection: Surgical  Other Topics Concern  . Not on file  Social History Narrative   Married   No regular exercise   Social Determinants of Health   Financial Resource Strain: Not on file  Food Insecurity: Not on file  Transportation Needs: Not on file  Physical Activity: Not on file  Stress: Not on file  Social Connections: Not on file    Family History  Problem Relation Age of Onset  . Heart attack Maternal Uncle   . Depression  Maternal Uncle   . Depression Mother   . Alcohol abuse Maternal Grandfather   . Alcohol abuse Maternal Uncle   . Depression Maternal Grandmother     Outpatient Encounter Medications as of 11/20/2020  Medication Sig  . albuterol (PROVENTIL HFA;VENTOLIN HFA) 108 (90 Base) MCG/ACT inhaler Inhale 2 puffs into the lungs every 6 (six) hours as needed for wheezing or shortness of breath.  Marland Kitchen amLODipine (NORVASC) 10 MG tablet Take 1 tablet (10 mg total) by mouth daily. For high blood pressure  . BD PEN NEEDLE NANO U/F 32G X 4 MM MISC USE AS DIRECTED TWICE DAILY  . blood glucose meter kit and supplies KIT 1 each by Does not apply route 4 (four) times daily. Dispense based on patient and insurance preference. Use up to four times daily as directed. (FOR ICD-9 250.00, 250.01).  . busPIRone (BUSPAR) 30 MG tablet Take 1 tablet (30 mg total) by mouth 3 (three) times daily.  . Cholecalciferol (VITAMIN D3) 125 MCG (5000 UT) CAPS Take 1 capsule (5,000 Units total) by mouth daily.  . clopidogrel (PLAVIX) 75 MG tablet TAKE 1 TABLET EVERY DAY  . escitalopram (LEXAPRO) 20 MG tablet Take 1 tablet (20 mg total) by mouth 3 (three) times daily. for mood control  . furosemide (LASIX)  20 MG tablet TAKE 1 TABLET (20 MG TOTAL) BY MOUTH DAILY.  Marland Kitchen glipiZIDE (GLUCOTROL) 10 MG tablet Take 1 tablet (10 mg total) by mouth 2 (two) times daily before a meal.  . glucose blood test strip 1 each by Other route 4 (four) times daily. Use as instructed to test blood glucose four times a day  . hydrOXYzine (ATARAX/VISTARIL) 50 MG tablet Take 1 tablet (50 mg total) by mouth every 6 (six) hours as needed for anxiety.  Marland Kitchen levothyroxine (SYNTHROID, LEVOTHROID) 50 MCG tablet Take 1 tablet (50 mcg total) by mouth daily before breakfast. For hypothyroidism  . metoprolol succinate (TOPROL-XL) 25 MG 24 hr tablet Take 1 tablet (25 mg total) by mouth daily.  . nitroGLYCERIN (NITROSTAT) 0.4 MG SL tablet Place under the tongue.  Marland Kitchen Potassium Chloride  ER 20 MEQ TBCR TAKE 1 TABLET EVERY DAY  . risperiDONE (RISPERDAL) 2 MG tablet Take 1 tablet (2 mg total) by mouth 2 (two) times daily.  . rosuvastatin (CRESTOR) 40 MG tablet Take 1 tablet (40 mg total) by mouth daily.  . traZODone (DESYREL) 100 MG tablet Take 1 tablet (100 mg total) by mouth at bedtime.  . Vitamin D, Ergocalciferol, (DRISDOL) 1.25 MG (50000 UNIT) CAPS capsule Take 1 capsule (50,000 Units total) by mouth every 7 (seven) days.  . [DISCONTINUED] atorvastatin (LIPITOR) 80 MG tablet Take 1 tablet (80 mg total) by mouth every morning. (Patient taking differently: Take 80 mg by mouth daily at 6 PM.)  . [DISCONTINUED] NOVOLIN 70/30 FLEXPEN (70-30) 100 UNIT/ML KwikPen INJECT 80 UNITS INTO THE SKIN AT BREAKFAST AND 80 UNITS AT DINNER IN THE EVENING.  . insulin isophane & regular human (NOVOLIN 70/30 FLEXPEN) (70-30) 100 UNIT/ML KwikPen Inject 80 Units into the skin 2 (two) times daily with a meal.   No facility-administered encounter medications on file as of 11/20/2020.    ALLERGIES: Allergies  Allergen Reactions  . Amoxicillin Swelling and Other (See Comments)    Oral swelling   . Doxycycline Nausea And Vomiting  . Levofloxacin Other (See Comments)    headache  . Propoxyphene N-Acetaminophen Nausea And Vomiting    VACCINATION STATUS: Immunization History  Administered Date(s) Administered  . Influenza,inj,Quad PF,6+ Mos 07/18/2018  . Pneumococcal Polysaccharide-23 07/18/2018    Diabetes She presents for her follow-up diabetic visit. She has type 2 diabetes mellitus. Onset time: She was diagnosed at approximate age of 52 years. Her disease course has been improving. There are no hypoglycemic associated symptoms. Pertinent negatives for hypoglycemia include no confusion, headaches, pallor, seizures or tremors. Associated symptoms include blurred vision, fatigue, polydipsia and polyuria. Pertinent negatives for diabetes include no chest pain and no polyphagia. There are no  hypoglycemic complications. Symptoms are improving. Diabetic complications include heart disease. (Status post triple coronary bypass in November 2019.) Risk factors for coronary artery disease include diabetes mellitus, dyslipidemia, family history, hypertension, obesity, sedentary lifestyle and tobacco exposure. Current diabetic treatment includes insulin injections and oral agent (monotherapy). She is compliant with treatment some of the time. Her weight is fluctuating minimally. She is following a generally unhealthy diet. When asked about meal planning, she reported none. She has not had a previous visit with a dietitian. She rarely participates in exercise. Her home blood glucose trend is decreasing steadily. (She presents today with her shared meter from her husband, and no logs (she lost them).  She reports improvement in her symptoms of hyperglycemia and she is taking her medications as prescribed.  She denies any episodes of  hypoglycemia.) An ACE inhibitor/angiotensin II receptor blocker is being taken. She does not see a podiatrist.Eye exam is current.  Hypertension This is a chronic problem. The current episode started more than 1 year ago. The problem has been waxing and waning since onset. The problem is uncontrolled. Associated symptoms include blurred vision. Pertinent negatives include no chest pain, headaches, palpitations or shortness of breath. Agents associated with hypertension include thyroid hormones. Risk factors for coronary artery disease include dyslipidemia, diabetes mellitus, obesity, sedentary lifestyle, smoking/tobacco exposure and family history. Past treatments include calcium channel blockers, diuretics and beta blockers. Hypertensive end-organ damage includes CAD/MI.  Hyperlipidemia This is a chronic problem. The current episode started more than 1 year ago. Exacerbating diseases include diabetes, hypothyroidism and obesity. Pertinent negatives include no chest pain, myalgias  or shortness of breath. Risk factors for coronary artery disease include diabetes mellitus, dyslipidemia, family history, obesity, hypertension, a sedentary lifestyle and post-menopausal.    Review of systems  Constitutional: minimally fluctuating body weight,  current Body mass index is 47.26 kg/m. , + fatigue (improving), no subjective hyperthermia, no subjective hypothermia Eyes: + blurry vision (improving), no xerophthalmia ENT: no sore throat, no nodules palpated in throat, no dysphagia/odynophagia, no hoarseness Cardiovascular: no chest pain, no shortness of breath, no palpitations, no leg swelling Respiratory: no cough, no shortness of breath Gastrointestinal: no nausea/vomiting/diarrhea Genitourinary: + polyuria (improving) Musculoskeletal: no muscle/joint aches Skin: no rashes, no hyperemia Neurological: no tremors, no numbness, no tingling, no dizziness Psychiatric: no depression, no anxiety   Objective:    BP (!) 148/91 (BP Location: Right Arm, Patient Position: Sitting)   Pulse 83   Ht 5' 5"  (1.651 m)   Wt 284 lb (128.8 kg)   BMI 47.26 kg/m   Wt Readings from Last 3 Encounters:  11/20/20 284 lb (128.8 kg)  11/06/20 276 lb 9.6 oz (125.5 kg)  03/10/20 296 lb 3.2 oz (134.4 kg)    BP Readings from Last 3 Encounters:  11/20/20 (!) 148/91  11/06/20 133/83  03/10/20 131/88     Physical Exam- Limited  Constitutional:  Body mass index is 47.26 kg/m. , not in acute distress, normal state of mind Eyes:  EOMI, no exophthalmos Neck: Supple Cardiovascular: RRR, no murmers, rubs, or gallops, no edema Respiratory: Adequate breathing efforts, no crackles, rales, rhonchi, or wheezing Musculoskeletal: no gross deformities, strength intact in all four extremities, no gross restriction of joint movements Skin:  no rashes, no hyperemia Neurological: no tremor with outstretched hands     CMP ( most recent) CMP     Component Value Date/Time   NA 137 11/06/2020 1524   K  4.4 11/06/2020 1524   CL 92 (L) 11/06/2020 1524   CO2 27 11/06/2020 1524   GLUCOSE 339 (H) 11/06/2020 1524   GLUCOSE 396 (H) 02/18/2020 1146   BUN 23 11/06/2020 1524   CREATININE 1.12 (H) 11/06/2020 1524   CREATININE 1.12 (H) 02/18/2020 1146   CALCIUM 9.7 11/06/2020 1524   PROT 6.9 11/06/2020 1524   ALBUMIN 4.1 11/06/2020 1524   AST 14 11/06/2020 1524   ALT 13 11/06/2020 1524   ALKPHOS 91 11/06/2020 1524   BILITOT 0.4 11/06/2020 1524   GFRNONAA 54 (L) 11/06/2020 1524   GFRNONAA 54 (L) 02/18/2020 1146   GFRAA 62 11/06/2020 1524   GFRAA 62 02/18/2020 1146     Diabetic Labs (most recent): Lab Results  Component Value Date   HGBA1C 9.1 (A) 11/06/2020   HGBA1C 10.5 (A) 02/18/2020   HGBA1C 9.6 (  H) 05/21/2019     Lipid Panel ( most recent) Lipid Panel     Component Value Date/Time   CHOL 350 (H) 11/06/2020 1524   TRIG 297 (H) 11/06/2020 1524   HDL 44 11/06/2020 1524   CHOLHDL 8.0 (H) 11/06/2020 1524   CHOLHDL 5.3 (H) 01/30/2019 0934   VLDL UNABLE TO CALCULATE IF TRIGLYCERIDE OVER 400 mg/dL 06/28/2009 0611   LDLCALC 243 (H) 11/06/2020 1524   LDLCALC 163 (H) 01/30/2019 0934     Assessment & Plan:   1) DM type 2 causing vascular disease ([place])  - [name] has currently uncontrolled symptomatic type 2 DM since 60 years of age.  She presents today with her shared meter from her husband, and no logs (she lost them).  She reports improvement in her symptoms of hyperglycemia and she is taking her medications as prescribed.  She denies any episodes of hypoglycemia.    -her diabetes is complicated by coronary artery disease status post triple bypass in November 2019, morbid obesity, history of smoking, sedentary life and she remains at a high risk for more acute and chronic complications which include CAD, CVA, CKD, retinopathy, and neuropathy. These are all discussed in detail with her.  - Nutritional counseling repeated at each appointment due to patients tendency to  fall back in to old habits.  - The patient admits there is a room for improvement in their diet and drink choices. -  Suggestion is made for the patient to avoid simple carbohydrates from their diet including Cakes, Sweet Desserts / Pastries, Ice Cream, Soda (diet and regular), Sweet Tea, Candies, Chips, Cookies, Sweet Pastries,  Store Bought Juices, Alcohol in Excess of  1-2 drinks a day, Artificial Sweeteners, Coffee Creamer, and "Sugar-free" Products. This will help patient to have stable blood glucose profile and potentially avoid unintended weight gain.   - I encouraged the patient to switch to  unprocessed or minimally processed complex starch and increased protein intake (animal or plant source), fruits, and vegetables.   - Patient is advised to stick to a routine mealtimes to eat 3 meals  a day and avoid unnecessary snacks ( to snack only to correct hypoglycemia).  - I have approached her with the following individualized plan to manage diabetes and patient agrees:   -Based on her current glycemic burden as well as her comorbidities, she would need intensive treatment with basal/bolus insulin in order for her to achieve control of diabetes to target.  -Given the lack of meter(dedicated to her) or logs to review, no changes will be made to her medication today for safety purposes.  She is advised to continue Novolin 70/30 80 units BID with meals if glucose is above 90 and she is eating.  She can also continue her Glipizide 10 mg twice daily with meals.   -She is encouraged to continue monitoring blood glucose 4 times daily, before meals and before bed, and to call the clinic if she has readings less than 70 or greater than 300 for 3 tests in a row.  -She could greatly benefit from CGM device, however it was prescribed for her once before and denied by her insurance.  May attempt to try again if she shows proper engagement in monitoring her glucose.  -She could not afford the copay for  Trulicity.  - Patient specific target  A1c;  LDL, HDL, Triglycerides, and  Waist Circumference were discussed in detail.  2) Blood Pressure /Hypertension:  -Her blood pressure is slightly elevated  today.  She is advised to continue Norvasc 10 mg po daily, Lasix 20 mg po daily, and Metoprolol 25 mg po daily.  3) Lipids/Hyperlipidemia:   Her most recent lipid panel from 11/06/20 shows uncontrolled LDL of 243 (worsening) and elevated triglycerides of 297 (worsening).  Will change her from Lipitor to Crestor 40 mg po daily at bedtime.  Side effects and precautions discussed with her.  4)  Weight/Diet:   Her Body mass index is 47.26 kg/m. -   clearly complicating her diabetes care.  She is a candidate for modest weight loss.  I discussed with her the fact that loss of 5 - 10% of her  current body weight will have the most impact on her diabetes management.  CDE Consult will be initiated . Exercise, and detailed carbohydrates information provided  -  detailed on discharge instructions.  5) Hypothyroidism: Her recent TFTs are consistent with appropriate hormone replacement.  She is advised to continue Levothyroxine 50 mcg po daily before breakfast.     Her recent thyroid function tests were consistent with appropriate replacement.  - We discussed about the correct intake of her thyroid hormone, on empty stomach at fasting, with water, separated by at least 30 minutes from breakfast and other medications,  and separated by more than 4 hours from calcium, iron, multivitamins, acid reflux medications (PPIs). -Patient is made aware of the fact that thyroid hormone replacement is needed for life, dose to be adjusted by periodic monitoring of thyroid function tests.  6) Vitamin D  Deficiency -Her most recent vitamin D level on 11/06/20 was 14.8.  She is not currently taking any supplementation.  I discussed and initiated replenishment with Ergocalciferol 50000 units PO weekly x 12 weeks.   7) Chronic  Care/Health Maintenance: -she is on Statin medications and  is encouraged to initiate and continue to follow up with Ophthalmology, Dentist,  Podiatrist at least yearly or according to recommendations, and advised to stay away from smoking. I have recommended yearly flu vaccine and pneumonia vaccine at least every 5 years; moderate intensity exercise for up to 150 minutes weekly; and  sleep for at least 7 hours a day.  - she is advised to maintain close follow up with Christain Sacramento, MD for primary care needs, as well as her other providers for optimal and coordinated care.  - Time spent on this patient care encounter:  40 min, of which > 50% was spent in  counseling and the rest reviewing her blood glucose logs , discussing her hypoglycemia and hyperglycemia episodes, reviewing her current and  previous labs / studies  ( including abstraction from other facilities) and medications  doses and developing a  long term treatment plan and documenting her care.   Please refer to Patient Instructions for Blood Glucose Monitoring and Insulin/Medications Dosing Guide"  in media tab for additional information. Please  also refer to " Patient Self Inventory" in the Media  tab for reviewed elements of pertinent patient history.  Leafy Ro participated in the discussions, expressed understanding, and voiced agreement with the above plans.  All questions were answered to her satisfaction. she is encouraged to contact clinic should she have any questions or concerns prior to her return visit.    Follow up plan: - Return in about 3 months (around 02/17/2021) for Diabetes follow up with A1c in office, No previsit labs, Bring glucometer and logs, ABI next visit.  Rayetta Pigg, FNP-BC [facility] [address] [place],  [place] [address] Phone: [phone] Fax: [phone]   11/20/2020, 4:48 PM

## 2020-11-20 NOTE — Patient Instructions (Signed)

## 2020-11-25 ENCOUNTER — Other Ambulatory Visit: Payer: Self-pay | Admitting: "Endocrinology

## 2020-12-08 ENCOUNTER — Other Ambulatory Visit: Payer: Self-pay | Admitting: Student

## 2020-12-09 ENCOUNTER — Telehealth: Payer: Self-pay

## 2020-12-09 MED ORDER — CLOPIDOGREL BISULFATE 75 MG PO TABS: 75.0000 mg | ORAL_TABLET | Freq: Every day | ORAL | 0 refills | Status: DC

## 2020-12-09 MED ORDER — POTASSIUM CHLORIDE ER 20 MEQ PO TBCR: 1.0000 | EXTENDED_RELEASE_TABLET | Freq: Every day | ORAL | 0 refills | Status: DC

## 2020-12-09 NOTE — Telephone Encounter (Signed)
Medication refill request approved for potassium cl 20 meq tablet and sent to [facility]. Patient needs a follow up visit for further refills.

## 2020-12-09 NOTE — Telephone Encounter (Signed)
Medication refill request approved for Clopidogrel 75 mg tablets and sent to [facility] pharmacy. Patient needs follow up appointment with Cardiology for further refills.

## 2020-12-12 ENCOUNTER — Telehealth (INDEPENDENT_AMBULATORY_CARE_PROVIDER_SITE_OTHER): Payer: Medicare HMO | Admitting: Psychiatry

## 2020-12-12 ENCOUNTER — Encounter (HOSPITAL_COMMUNITY): Payer: Self-pay | Admitting: Psychiatry

## 2020-12-12 ENCOUNTER — Other Ambulatory Visit: Payer: Self-pay

## 2020-12-12 DIAGNOSIS — F3130 Bipolar disorder, current episode depressed, mild or moderate severity, unspecified: Secondary | ICD-10-CM

## 2020-12-12 MED ORDER — BUSPIRONE HCL 30 MG PO TABS
30.0000 mg | ORAL_TABLET | Freq: Three times a day (TID) | ORAL | 2 refills | Status: DC
Start: 2020-12-12 — End: 2021-01-06

## 2020-12-12 MED ORDER — RISPERIDONE 2 MG PO TABS: 2.0000 mg | ORAL_TABLET | Freq: Two times a day (BID) | ORAL | 2 refills | Status: DC

## 2020-12-12 MED ORDER — DULOXETINE HCL 60 MG PO CPEP
60.0000 mg | ORAL_CAPSULE | Freq: Two times a day (BID) | ORAL | 3 refills | Status: DC
Start: 2020-12-12 — End: 2021-01-06

## 2020-12-12 MED ORDER — TRAZODONE HCL 100 MG PO TABS: 200.0000 mg | ORAL_TABLET | Freq: Every day | ORAL | 2 refills | Status: DC

## 2020-12-12 MED ORDER — HYDROXYZINE PAMOATE 25 MG PO CAPS: 25.0000 mg | ORAL_CAPSULE | Freq: Three times a day (TID) | ORAL | 2 refills | Status: DC | PRN

## 2020-12-12 NOTE — Progress Notes (Signed)
Virtual Visit via Telephone Note  I connected with [name] on 12/12/20 at  9:40 AM EDT by telephone and verified that I am speaking with the correct person using two identifiers.  Location: Patient: home Provider: home   I discussed the limitations, risks, security and privacy concerns of performing an evaluation and management service by telephone and the availability of in person appointments. I also discussed with the patient that there may be a patient responsible charge related to this service. The patient expressed understanding and agreed to proceed    I discussed the assessment and treatment plan with the patient. The patient was provided an opportunity to ask questions and all were answered. The patient agreed with the plan and demonstrated an understanding of the instructions.   The patient was advised to call back or seek an in-person evaluation if the symptoms worsen or if the condition fails to improve as anticipated.  I provided 15 minutes of non-face-to-face time during this encounter.   [name], MD  [facility] MD/PA/NP OP Progress Note  12/12/2020 9:54 AM [name]  MRN:  [id number]  Chief Complaint: depression, anxiety HPI: This patient is a 58 year oldwidowed white female who lives alonein [place]. She has 2 grown daughters and one 49 year old grandson. She is on disability for both back pain and bipolar disorder.  The patient was referred by Dr. Adele Schilder from our [facility] clinic. The patient would like to start coming here as it is too far for her to drive to the other clinic.  The patient states that she has had problems with mood since her mid 43s. She began losing her temper having severe outburst mood swings and lashing out at people. 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 She began being paranoid and accusing people of doing things behind her back or talking about her. She was  hospitalized 4 times the last time being at the behavioral health hospital in 2011. At that time she was very depressed hallucinating and hearing command hallucinations to cut herself or kill herself. She was Stabilized on a combination of lithium Wellbutrin and Risperdal.She had seen Dr. Adele Schilder for a while and had been transferred to me after it became too far to drive to the [facility] clinic  Patient returns for follow-up after a long absence.  She reports that she has become more anxious lately.  She was last seen about 11 months ago.  She states that a neighbor found a snake: Under her house as well as rats.  This may call was covered but she is waiting for an exterminator to get rid of the rats.  She has become increasingly anxious.  She also states that she is more depressed has little energy does not feel like doing much of anything but denies suicidal ideation.  In retrospect she thinks she did better on the Cymbalta been the Lexapro so we can change this.  Her daughters have never been happy about her being on benzodiazepines because she tends to abuse them.  She is already on the highest dose of BuSpar-30 mg 3 times daily so we will add hydroxyzine. Visit Diagnosis:    ICD-10-CM   1. Bipolar I disorder, most recent episode depressed ([place])  F31.30     Past Psychiatric History: For previous hospitalizations for bipolar disorder, long-term outpatient treatment  Past Medical History:  Past Medical History:  Diagnosis Date   Asthma    Back pain  CAD (coronary artery disease)    a. 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   Thyroid disease     Past  Surgical History:  Procedure Laterality Date   TUBAL LIGATION     Bilateral    Family Psychiatric History: see below  Family History:  Family History  Problem Relation Age of Onset   Heart attack Maternal Uncle    Depression Maternal Uncle    Depression Mother    Alcohol abuse Maternal Grandfather    Alcohol abuse Maternal Uncle    Depression Maternal Grandmother     Social History:  Social History   Socioeconomic History   Marital status: Widowed    Spouse name: Not on file   Number of children: Not on file   Years of education: Not on file   Highest education level: Not on file  Occupational History   Occupation: Disabled    Employer: UNEMPLOYED  Tobacco Use   Smoking status: Never Smoker   Smokeless tobacco: Never Used  Vaping Use   Vaping Use: Never used  Substance and Sexual Activity   Alcohol use: No    Alcohol/week: 0.0 standard drinks   Drug use: No    Comment: 05/13/2016 per pt no   Sexual activity: Yes    Birth control/protection: Surgical  Other Topics Concern   Not on file  Social History Narrative   Married   No regular exercise   Social Determinants of Health   Financial Resource Strain: Not on file  Food Insecurity: Not on file  Transportation Needs: Not on file  Physical Activity: Not on file  Stress: Not on file  Social Connections: Not on file    Allergies:  Allergies  Allergen Reactions   Amoxicillin Swelling and Other (See Comments)    Oral swelling    Doxycycline Nausea And Vomiting   Levofloxacin Other (See Comments)    headache   Propoxyphene N-Acetaminophen Nausea And Vomiting    Metabolic Disorder Labs: Lab Results  Component Value Date   HGBA1C 9.1 (A) 11/06/2020   MPG 229 05/21/2019   MPG 232 01/30/2019   No results found for: PROLACTIN Lab Results  Component Value Date   CHOL 350 (H) 11/06/2020   TRIG 297 (H) 11/06/2020   HDL 44 11/06/2020   CHOLHDL 8.0 (H) 11/06/2020   VLDL UNABLE  TO CALCULATE IF TRIGLYCERIDE OVER 400 mg/dL 06/28/2009   LDLCALC 243 (H) 11/06/2020   LDLCALC 163 (H) 01/30/2019   Lab Results  Component Value Date   TSH 1.750 11/06/2020   TSH 2.77 02/18/2020    Therapeutic Level Labs: Lab Results  Component Value Date   LITHIUM 0.6 (L) 07/07/2016   LITHIUM 0.6 (L) 03/10/2016   No results found for: VALPROATE No components found for:  CBMZ  Current Medications: Current Outpatient Medications  Medication Sig Dispense Refill   DULoxetine (CYMBALTA) 60 MG capsule Take 1 capsule (60 mg total) by mouth 2 (two) times daily. 60 capsule 3   hydrOXYzine (VISTARIL) 25 MG capsule Take 1 capsule (25 mg total) by mouth 3 (three) times daily as needed. 90 capsule 2  ACCU-CHEK GUIDE test strip USE TO TEST BLOOD SUGAR 4 TIMES DAILY. 100 strip 0   albuterol (PROVENTIL HFA;VENTOLIN HFA) 108 (90 Base) MCG/ACT inhaler Inhale 2 puffs into the lungs every 6 (six) hours as needed for wheezing or shortness of breath. 1 Inhaler 0   amLODipine (NORVASC) 10 MG tablet Take 1 tablet (10 mg total) by mouth daily. For high blood pressure 30 tablet 0   BD PEN NEEDLE NANO U/F 32G X 4 MM MISC USE AS DIRECTED TWICE DAILY 100 each 0   blood glucose meter kit and supplies KIT 1 each by Does not apply route 4 (four) times daily. Dispense based on patient and insurance preference. Use up to four times daily as directed. (FOR ICD-9 250.00, 250.01). 1 each 0   busPIRone (BUSPAR) 30 MG tablet Take 1 tablet (30 mg total) by mouth 3 (three) times daily. 270 tablet 2   Cholecalciferol (VITAMIN D3) 125 MCG (5000 UT) CAPS Take 1 capsule (5,000 Units total) by mouth daily. 90 capsule 0   clopidogrel (PLAVIX) 75 MG tablet Take 1 tablet (75 mg total) by mouth daily. 60 tablet 0   furosemide (LASIX) 20 MG tablet TAKE 1 TABLET (20 MG TOTAL) BY MOUTH DAILY. 90 tablet 3   glipiZIDE (GLUCOTROL) 10 MG tablet Take 1 tablet (10 mg total) by mouth 2 (two) times daily before a meal. 60 tablet 0    insulin isophane & regular human (NOVOLIN 70/30 FLEXPEN) (70-30) 100 UNIT/ML KwikPen Inject 80 Units into the skin 2 (two) times daily with a meal. 18 each 6   levothyroxine (SYNTHROID, LEVOTHROID) 50 MCG tablet Take 1 tablet (50 mcg total) by mouth daily before breakfast. For hypothyroidism 30 tablet 0   metoprolol succinate (TOPROL-XL) 25 MG 24 hr tablet TAKE 1 TABLET EVERY DAY (NEED MD APPOINTMENT FOR REFILLS) 15 tablet 0   nitroGLYCERIN (NITROSTAT) 0.4 MG SL tablet Place under the tongue.     Potassium Chloride ER 20 MEQ TBCR Take 1 tablet by mouth daily. 60 tablet 0   risperiDONE (RISPERDAL) 2 MG tablet Take 1 tablet (2 mg total) by mouth 2 (two) times daily. 180 tablet 2   rosuvastatin (CRESTOR) 40 MG tablet Take 1 tablet (40 mg total) by mouth daily. 90 tablet 3   traZODone (DESYREL) 100 MG tablet Take 2 tablets (200 mg total) by mouth at bedtime. 180 tablet 2   Vitamin D, Ergocalciferol, (DRISDOL) 1.25 MG (50000 UNIT) CAPS capsule Take 1 capsule (50,000 Units total) by mouth every 7 (seven) days. 12 capsule 0   No current facility-administered medications for this visit.     Musculoskeletal: Strength & Muscle Tone: within normal limits Gait & Station: normal Patient leans: N/A  Psychiatric Specialty Exam: Review of Systems  Constitutional: Positive for fatigue.  Musculoskeletal: Positive for back pain.  Psychiatric/Behavioral: Positive for dysphoric mood and sleep disturbance. The patient is nervous/anxious.   All other systems reviewed and are negative.   There were no vitals taken for this visit.There is no height or weight on file to calculate BMI.  General Appearance: NA  Eye Contact:  NA  Speech:  Clear and Coherent  Volume:  Decreased  Mood:  Anxious and Depressed  Affect:  NA  Thought Process:  Goal Directed  Orientation:  Full (Time, Place, and Person)  Thought Content: Rumination   Suicidal Thoughts:  No  Homicidal Thoughts:  No  Memory:  Immediate;    Good Recent;   Good Remote;   Fair  Judgement:  Fair  Insight:  Fair  Psychomotor Activity:  Decreased  Concentration:  Concentration: Fair and Attention Span: Fair  Recall:  [employer] of Knowledge: Good  Language: Good  Akathisia:  No  Handed:  Right  AIMS (if indicated): not done  Assets:  Communication Skills Desire for Improvement Resilience Social Support Talents/Skills  ADL's:  Intact  Cognition: WNL  Sleep:  Poor   Screenings: AIMS   Flowsheet Row Admission (Discharged) from 07/04/2018 in [facility]  AIMS Total Score 0    AUDIT   Flowsheet Row Admission (Discharged) from 07/04/2018 in [place] [facility]  Alcohol Use Disorder Identification Test Final Score (AUDIT) 0    MDI   Flowsheet Row Office Visit from 04/13/2016 in [facility]  Total Score (max 50) 25    PHQ2-9   Flowsheet Row Video Visit from 12/12/2020 in [facility] [facility] from [facility]06/2019 [facility] from 06/21/2016 in [facility]  PHQ-2 Total Score 5 0 4  PHQ-9 Total Score 17 -- 15    Flowsheet Row Video Visit from 12/12/2020 in [facility] [facility] (Discharged) from 07/24/2018 in [place] Admission (Discharged) from 07/04/2018 in [facility]  C-SSRS RISK CATEGORY No Risk No Risk No Risk       Assessment and Plan: This patient is a 60 year old female with a history of significant heart disease, type 2 diabetes and bipolar disorder.  She seems to be getting more depressed and anxious particularly since finding the [place] in her house.  She will discontinue Lexapro and change back to Cymbalta 60 mg twice daily for depression.  She will continue BuSpar 30 mg 3  times daily for anxiety and add hydroxyzine 25 mg 3 times daily for anxiety.  She will continue Risperdal 2 mg twice daily for mood stabilization and continue trazodone but increase the dosage to 200 mg at bedtime for sleep.  She will return to see me in 4 weeks   [name], MD 12/12/2020, 9:54 AM

## 2020-12-24 NOTE — Progress Notes (Signed)
Cardiology Office Note    Date:  12/25/2020   ID:  ORLENA GARMON, DOB January 17, 1961, MRN [id number]  PCP:  Christain Sacramento, MD  Cardiologist: Kate Sable, MD (Inactive) --> Needs to switch to new MD  Chief Complaint  Patient presents with  . Follow-up    Overdue Annual Visit    History of Present Illness:    [name] is a 60 y.o. female with past medical history of CAD (s/p CABG in 07/2018 with SVG-PDA, SVG-RI, seq LIMA-mid-LAD-distal LAD), HTN, HLD and IDDM who presents to the office today for overdue follow-up.  She was last examined by myself in 08/2018 following recent admission for an NSTEMI and was transferred to [facility] and ultimately required CABG with details as outlined above. She was initially discharged to SNF but had returned home at the time of her office visit. She denied any exertional pain but did have some incisional discomfort. She was continued on her current medication regimen including Plavix 75 mg daily (on this instead of ASA per CT Surgery) and Atorvastatin 80 mg daily with Lopressor 37.5 mg once daily being switched to Toprol-XL 25 mg daily for sustained release. She was informed to follow-up in 3 months but has not been evaluated since.  In talking with the patient and her daughter today, she reports overall doing well since her last office visit. She does have baseline dyspnea on exertion but says this has significantly improved as compared to how she felt prior to CABG. She does experience occasional episodes of chest discomfort which resolve with sublingual nitroglycerin but has not had to use this in over 6 months. No recent palpitations, orthopnea or PND. She does experience intermittent lower extremity edema and takes Lasix 20 mg daily.   Past Medical History:  Diagnosis Date  . Asthma   . Back pain   . CAD (coronary artery disease)    a. 06/2018: s/p cath showing 3-vessel CAD --> s/p 4-vessel CABG on 07/28/2018 with SVG-PDA, SVG-RI,  seq LIMA-mid-LAD-distal LAD (had a false negative stress test two weeks prior).   . Chest pain   . Diabetes mellitus type II   . Headache   . High cholesterol   . Hypertension   . Obesity, morbid ([place])   . Other and unspecified bipolar disorders   . 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 Thyroid disease     Past Surgical History:  Procedure Laterality Date  . TUBAL LIGATION     Bilateral    Current Medications: Outpatient Medications Prior to Visit  Medication Sig Dispense Refill  . ACCU-CHEK GUIDE test strip USE TO TEST BLOOD SUGAR 4 TIMES DAILY. 100 strip 0  . albuterol (PROVENTIL HFA;VENTOLIN HFA) 108 (90 Base) MCG/ACT inhaler Inhale 2 puffs into the lungs every 6 (six) hours as needed for wheezing or shortness of breath. 1 Inhaler 0  . amLODipine (NORVASC) 10 MG tablet Take 1 tablet (10 mg total) by mouth daily. For high blood pressure 30 tablet 0  . BD PEN NEEDLE NANO U/F 32G X 4 MM MISC USE AS DIRECTED TWICE DAILY 100 each 0  . blood glucose meter kit and supplies KIT 1 each by Does not apply route 4 (four) times daily. Dispense based on patient and insurance preference. Use up to four times daily as directed. (FOR ICD-9 250.00, 250.01). 1 each 0  . busPIRone (BUSPAR) 30 MG tablet Take 1 tablet (30 mg total) by mouth 3 (three) times daily. 270 tablet 2  .  Cholecalciferol (VITAMIN D3) 125 MCG (5000 UT) CAPS Take 1 capsule (5,000 Units total) by mouth daily. 90 capsule 0  . DULoxetine (CYMBALTA) 60 MG capsule Take 1 capsule (60 mg total) by mouth 2 (two) times daily. 60 capsule 3  . furosemide (LASIX) 20 MG tablet TAKE 1 TABLET (20 MG TOTAL) BY MOUTH DAILY. 90 tablet 3  . glipiZIDE (GLUCOTROL) 10 MG tablet Take 1 tablet (10 mg total) by mouth 2 (two) times daily before a meal. 60 tablet 0  . hydrOXYzine (VISTARIL) 25 MG capsule Take 1 capsule (25 mg total) by mouth 3 (three) times daily as needed. 90 capsule 2  . insulin isophane & regular human (NOVOLIN 70/30 FLEXPEN) (70-30) 100 UNIT/ML  KwikPen Inject 80 Units into the skin 2 (two) times daily with a meal. 18 each 6  . levothyroxine (SYNTHROID, LEVOTHROID) 50 MCG tablet Take 1 tablet (50 mcg total) by mouth daily before breakfast. For hypothyroidism 30 tablet 0  . metoprolol succinate (TOPROL-XL) 25 MG 24 hr tablet TAKE 1 TABLET EVERY DAY (NEED MD APPOINTMENT FOR REFILLS) 15 tablet 0  . Potassium Chloride ER 20 MEQ TBCR Take 1 tablet by mouth daily. 60 tablet 0  . risperiDONE (RISPERDAL) 2 MG tablet Take 1 tablet (2 mg total) by mouth 2 (two) times daily. 180 tablet 2  . rosuvastatin (CRESTOR) 40 MG tablet Take 1 tablet (40 mg total) by mouth daily. 90 tablet 3  . traZODone (DESYREL) 100 MG tablet Take 2 tablets (200 mg total) by mouth at bedtime. 180 tablet 2  . Vitamin D, Ergocalciferol, (DRISDOL) 1.25 MG (50000 UNIT) CAPS capsule Take 1 capsule (50,000 Units total) by mouth every 7 (seven) days. 12 capsule 0  . clopidogrel (PLAVIX) 75 MG tablet Take 1 tablet (75 mg total) by mouth daily. 60 tablet 0  . nitroGLYCERIN (NITROSTAT) 0.4 MG SL tablet Place under the tongue.     No facility-administered medications prior to visit.     Allergies:   Amoxicillin, Doxycycline, Levofloxacin, and Propoxyphene n-acetaminophen   Social History   Socioeconomic History  . Marital status: Widowed    Spouse name: Not on file  . Number of children: Not on file  . Years of education: Not on file  . Highest education level: Not on file  Occupational History  . Occupation: Disabled    Employer: UNEMPLOYED  Tobacco Use  . Smoking status: Never Smoker  . Smokeless tobacco: Never Used  Vaping Use  . Vaping Use: Never used  Substance and Sexual Activity  . Alcohol use: No    Alcohol/week: 0.0 standard drinks  . Drug use: No    Comment: 05/13/2016 per pt no  . Sexual activity: Yes    Birth control/protection: Surgical  Other Topics Concern  . Not on file  Social History Narrative   Married   No regular exercise   Social  Determinants of Health   Financial Resource Strain: Not on file  Food Insecurity: Not on file  Transportation Needs: Not on file  Physical Activity: Not on file  Stress: Not on file  Social Connections: Not on file     Family History:  The patient's family history includes Alcohol abuse in her maternal grandfather and maternal uncle; Depression in her maternal grandmother, maternal uncle, and mother; Heart attack in her maternal uncle.   Review of Systems:   Please see the history of present illness.      General:  No chills, fever, night sweats or weight changes.  Cardiovascular:  No chest pain, orthopnea, palpitations, paroxysmal nocturnal dyspnea. Positive for dyspnea on exertion.  Dermatological: No rash, lesions/masses Respiratory: No cough, dyspnea Urologic: No hematuria, dysuria Abdominal:   No nausea, vomiting, diarrhea, bright red blood per rectum, melena, or hematemesis Neurologic:  No visual changes, wkns, changes in mental status. All other systems reviewed and are otherwise negative except as noted above.   Physical Exam:    VS:  BP 132/74   Pulse 92   Ht 5' 5"  (1.651 m)   Wt 277 lb (125.6 kg)   SpO2 98%   BMI 46.10 kg/m    General: Well developed, well nourished,female appearing in no acute distress. Head: Normocephalic, atraumatic. Neck: No carotid bruits. JVD not elevated.  Lungs: Respirations regular and unlabored, without wheezes or rales.  Heart: Regular rate and rhythm. No S3 or S4.  No murmur, no rubs, or gallops appreciated. Abdomen: Appears non-distended. No obvious abdominal masses. Msk:  Strength and tone appear normal for age. No obvious joint deformities or effusions. Extremities: No clubbing or cyanosis. Trace ankle edema.  Distal pedal pulses are 2+ bilaterally. Neuro: Alert and oriented X 3. Moves all extremities spontaneously. No focal deficits noted. Psych:  Responds to questions appropriately with a normal affect. Skin: No rashes or lesions  noted  Wt Readings from Last 3 Encounters:  12/25/20 277 lb (125.6 kg)  11/20/20 284 lb (128.8 kg)  11/06/20 276 lb 9.6 oz (125.5 kg)     Studies/Labs Reviewed:   EKG:  EKG is ordered today.  The ekg ordered today demonstrates NSR, HR 92 with inferior infarct pattern and nonspecific ST abnormality along the lateral leads which is similar to prior tracings.    Recent Labs: 11/06/2020: ALT 13; BUN 23; Creatinine, Ser 1.12; Potassium 4.4; Sodium 137; TSH 1.750   Lipid Panel    Component Value Date/Time   CHOL 350 (H) 11/06/2020 1524   TRIG 297 (H) 11/06/2020 1524   HDL 44 11/06/2020 1524   CHOLHDL 8.0 (H) 11/06/2020 1524   CHOLHDL 5.3 (H) 01/30/2019 0934   VLDL UNABLE TO CALCULATE IF TRIGLYCERIDE OVER 400 mg/dL 06/28/2009 0611   LDLCALC 243 (H) 11/06/2020 1524   LDLCALC 163 (H) 01/30/2019 0934    Additional studies/ records that were reviewed today include:   Echocardiogram: 06/2018 Study Conclusions   - Left ventricle: The cavity size was normal. Wall thickness was  increased in a pattern of moderate LVH. Systolic function was  vigorous. The estimated ejection fraction was in the range of 65%  to 70%. Wall motion was normal; there were no regional wall  motion abnormalities. Features are consistent with a pseudonormal  left ventricular filling pattern, with concomitant abnormal  relaxation and increased filling pressure (grade 2 diastolic  dysfunction). Doppler parameters are consistent with high  ventricular filling pressure.  - Aortic valve: Trileaflet; mildly thickened, mildly calcified  leaflets. There was very mild stenosis. Peak velocity (S): 208  cm/s. Mean gradient (S): 9 mm Hg. Valve area (VTI): 2 cm^2. Valve  area (Vmax): 2 cm^2. Valve area (Vmean): 1.95 cm^2.   Cardiac Catheterization: 07/2018 LCP via right radial for NSTEMI with concerns for subtle inf STE on  initial ECG. Severe 3vd (prox to mid and distal LAD with Diag, prox and  mid  [place], distal Cx, proxRCA with some disease in RPL and RPDA), not  favorable for percutaneous revascularization. LVEDP 31. EBL < 5 cc,  prelude sync for hemostasis, no specimens.     Assessment:  1. Coronary artery disease involving native coronary artery of native heart without angina pectoris   2. Essential hypertension, benign   3. Hyperlipidemia LDL goal <70      Plan:   In order of problems listed above:  1. CAD - She is s/p CABG in 07/2018 with SVG-PDA, SVG-RI, seq LIMA-mid-LAD-distal LAD. She denies any recent chest pain and reports having stable dyspnea on exertion but this has overall improved over the past several years. - Given the timeframe since CABG, will plan to stop Plavix and switch to ASA 81 mg daily.  Continue Toprol-XL 25 mg daily, Crestor 40 mg daily and PRN SL NTG.   2. HTN - BP is well controlled at 132/74 during today's visit. Continue current medication regimen with Amlodipine 10 mg daily and Toprol-XL 25 mg daily.  3. HLD - Recent FLP last month showed her LDL was elevated to 243 and she was switched from Atorvastatin to Crestor 40 mg daily by Endocrinology. She does have plans for follow-up labs in 3 months. If LDL remains above goal of 70, would consider the addition of Zetia or referral to the [facility] for consideration of PCSK9 inhibitor therapy.   Medication Adjustments/Labs and Tests Ordered: Current medicines are reviewed at length with the patient today.  Concerns regarding medicines are outlined above.  Medication changes, Labs and Tests ordered today are listed in the Patient Instructions below. Patient Instructions  Medication Instructions:  Your physician has recommended you make the following change in your medication: STOP PLAVIX 75 mg tablets START EC 81 mg ASPIRIN DAILY  *If you need a refill on your cardiac medications before your next appointment, please call your pharmacy*   Lab Work: None If you have labs (blood work)  drawn today and your tests are completely normal, you will receive your results only by: Marland Kitchen MyChart Message (if you have MyChart) OR . A paper copy in the mail If you have any lab test that is abnormal or we need to change your treatment, we will call you to review the results.   Testing/Procedures: None   Follow-Up: At [facility], you and your health needs are our priority.  As part of our continuing mission to provide you with exceptional heart care, we have created designated Provider Care Teams.  These Care Teams include your primary Cardiologist (physician) and Advanced Practice Providers (APPs -  Physician Assistants and Nurse Practitioners) who all work together to provide you with the care you need, when you need it.  We recommend signing up for the patient portal called "MyChart".  Sign up information is provided on this After Visit Summary.  MyChart is used to connect with patients for Virtual Visits (Telemedicine).  Patients are able to view lab/test results, encounter notes, upcoming appointments, etc.  Non-urgent messages can be sent to your provider as well.   To learn more about what you can do with MyChart, go to [email or web address].    Your next appointment:   12 month(s)  The format for your next appointment:   In Person  Provider:   Bernerd Pho, PA-C   Other Instructions None        Signed, Erma Heritage, PA-C  12/25/2020 4:52 PM    [facility]. [address] [place], [place] [address] Phone: [phone] Fax: [phone]

## 2020-12-25 ENCOUNTER — Encounter: Payer: Self-pay | Admitting: Student

## 2020-12-25 ENCOUNTER — Ambulatory Visit: Payer: Medicare HMO | Admitting: Student

## 2020-12-25 ENCOUNTER — Other Ambulatory Visit: Payer: Self-pay

## 2020-12-25 VITALS — BP 132/74 | HR 92 | Ht 65.0 in | Wt 277.0 lb

## 2020-12-25 DIAGNOSIS — I2510 Atherosclerotic heart disease of native coronary artery without angina pectoris: Secondary | ICD-10-CM

## 2020-12-25 DIAGNOSIS — E785 Hyperlipidemia, unspecified: Secondary | ICD-10-CM

## 2020-12-25 DIAGNOSIS — I10 Essential (primary) hypertension: Secondary | ICD-10-CM

## 2020-12-25 MED ORDER — ASPIRIN EC 81 MG PO TBEC: 81.0000 mg | DELAYED_RELEASE_TABLET | Freq: Every day | ORAL | 11 refills | Status: DC

## 2020-12-25 MED ORDER — NITROGLYCERIN 0.4 MG SL SUBL: 0.4000 mg | SUBLINGUAL_TABLET | SUBLINGUAL | 3 refills | Status: AC | PRN

## 2020-12-25 NOTE — Patient Instructions (Signed)
Medication Instructions:  Your physician has recommended you make the following change in your medication: STOP PLAVIX 75 mg tablets START EC 81 mg ASPIRIN DAILY  *If you need a refill on your cardiac medications before your next appointment, please call your pharmacy*   Lab Work: None If you have labs (blood work) drawn today and your tests are completely normal, you will receive your results only by: Marland Kitchen MyChart Message (if you have MyChart) OR . A paper copy in the mail If you have any lab test that is abnormal or we need to change your treatment, we will call you to review the results.   Testing/Procedures: None   Follow-Up: At [facility], you and your health needs are our priority.  As part of our continuing mission to provide you with exceptional heart care, we have created designated Provider Care Teams.  These Care Teams include your primary Cardiologist (physician) and Advanced Practice Providers (APPs -  Physician Assistants and Nurse Practitioners) who all work together to provide you with the care you need, when you need it.  We recommend signing up for the patient portal called "MyChart".  Sign up information is provided on this After Visit Summary.  MyChart is used to connect with patients for Virtual Visits (Telemedicine).  Patients are able to view lab/test results, encounter notes, upcoming appointments, etc.  Non-urgent messages can be sent to your provider as well.   To learn more about what you can do with MyChart, go to [email or web address].    Your next appointment:   12 month(s)  The format for your next appointment:   In Person  Provider:   Randall An, PA-C   Other Instructions None

## 2021-01-06 ENCOUNTER — Telehealth (INDEPENDENT_AMBULATORY_CARE_PROVIDER_SITE_OTHER): Payer: Medicare HMO | Admitting: Psychiatry

## 2021-01-06 ENCOUNTER — Other Ambulatory Visit: Payer: Self-pay

## 2021-01-06 ENCOUNTER — Encounter (HOSPITAL_COMMUNITY): Payer: Self-pay | Admitting: Psychiatry

## 2021-01-06 DIAGNOSIS — F3130 Bipolar disorder, current episode depressed, mild or moderate severity, unspecified: Secondary | ICD-10-CM | POA: Diagnosis not present

## 2021-01-06 MED ORDER — RISPERIDONE 2 MG PO TABS
2.0000 mg | ORAL_TABLET | Freq: Two times a day (BID) | ORAL | 2 refills | Status: DC
Start: 2021-01-06 — End: 2021-03-11

## 2021-01-06 MED ORDER — TRAZODONE HCL 100 MG PO TABS: 200.0000 mg | ORAL_TABLET | Freq: Every day | ORAL | 2 refills | Status: DC

## 2021-01-06 MED ORDER — BUSPIRONE HCL 30 MG PO TABS
30.0000 mg | ORAL_TABLET | Freq: Three times a day (TID) | ORAL | 2 refills | Status: DC
Start: 2021-01-06 — End: 2021-03-11

## 2021-01-06 MED ORDER — DULOXETINE HCL 60 MG PO CPEP: 60.0000 mg | ORAL_CAPSULE | Freq: Two times a day (BID) | ORAL | 3 refills | Status: DC

## 2021-01-06 MED ORDER — HYDROXYZINE PAMOATE 25 MG PO CAPS: 25.0000 mg | ORAL_CAPSULE | Freq: Three times a day (TID) | ORAL | 2 refills | Status: DC | PRN

## 2021-01-06 NOTE — Progress Notes (Signed)
Virtual Visit via Telephone Note  I connected with [name] on 01/06/21 at  9:20 AM EDT by telephone and verified that I am speaking with the correct person using two identifiers.  Location: Patient: home Provider: home   I discussed the limitations, risks, security and privacy concerns of performing an evaluation and management service by telephone and the availability of in person appointments. I also discussed with the patient that there may be a patient responsible charge related to this service. The patient expressed understanding and agreed to proceed.    I discussed the assessment and treatment plan with the patient. The patient was provided an opportunity to ask questions and all were answered. The patient agreed with the plan and demonstrated an understanding of the instructions.   The patient was advised to call back or seek an in-person evaluation if the symptoms worsen or if the condition fails to improve as anticipated.  I provided 15 minutes of non-face-to-face time during this encounter.   [name], MD  [facility] MD/PA/NP OP Progress Note  01/06/2021 9:33 AM [name]  MRN:  [id number]  Chief Complaint:  Chief Complaint    Depression; Anxiety; Follow-up     HPI: This patient is a 60 year oldwidowed white female who lives alonein [place]. She has 2 grown daughters and one 1 year old grandson. She is on disability for both back pain and bipolar disorder.  The patient was referred by Dr. Adele Schilder from our [facility] clinic. The patient would like to start coming here as it is too far for her to drive to the other clinic.  The patient states that she has had problems with mood since her mid 32s. She began losing her temper having severe outburst mood swings and lashing out at people. 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 She began being paranoid and accusing people of doing things behind her  back or talking about her. She was hospitalized 4 times the last time being at the behavioral [facility] in 2011. At that time she was very depressed hallucinating and hearing command hallucinations to cut herself or kill herself. She was Stabilized on a combination of lithium Wellbutrin and Risperdal.She had seen Dr. Adele Schilder for a while and had been transferred to me after it became too far to drive to the [facility] clinic  The patient returns for follow-up after 4 weeks.  Last time she was feeling more depressed and worried.  She did not think the Lexapro is working anymore and thinks she did better on the Cymbalta.  She is back to the Cymbalta 60 mg twice daily and she is feeling better.  Her mood has improved her energy is better as well as her outlook.  She denies any thoughts of suicidal ideation.  She is still having trouble with chronic pain from her arthritis that she is getting out doing some things.  She is sleeping better.  Her anxiety is under better control.  She had an exterminator come to her house and now feels more secure that there will be no further snakes or rats. Visit Diagnosis:    ICD-10-CM   1. Bipolar I disorder, most recent episode depressed ([place])  F31.30     Past Psychiatric History: 4 previous hospitalizations for bipolar disorder, long-term outpatient treatment  Past Medical History:  Past Medical History:  Diagnosis Date  . Asthma   . Back pain   . CAD (coronary artery disease)  a. 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   . Chest pain   . Diabetes mellitus type II   . Headache   . High cholesterol   . Hypertension   . Obesity, morbid ([place])   . Other and unspecified bipolar disorders   . 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 Thyroid disease     Past Surgical History:  Procedure Laterality Date  . TUBAL LIGATION      Bilateral    Family Psychiatric History: see below  Family History:  Family History  Problem Relation Age of Onset  . Heart attack Maternal Uncle   . Depression Maternal Uncle   . Depression Mother   . Alcohol abuse Maternal Grandfather   . Alcohol abuse Maternal Uncle   . Depression Maternal Grandmother     Social History:  Social History   Socioeconomic History  . Marital status: Widowed    Spouse name: Not on file  . Number of children: Not on file  . Years of education: Not on file  . Highest education level: Not on file  Occupational History  . Occupation: Disabled    Employer: UNEMPLOYED  Tobacco Use  . Smoking status: Never Smoker  . Smokeless tobacco: Never Used  Vaping Use  . Vaping Use: Never used  Substance and Sexual Activity  . Alcohol use: No    Alcohol/week: 0.0 standard drinks  . Drug use: No    Comment: 05/13/2016 per pt no  . Sexual activity: Yes    Birth control/protection: Surgical  Other Topics Concern  . Not on file  Social History Narrative   Married   No regular exercise   Social Determinants of Health   Financial Resource Strain: Not on file  Food Insecurity: Not on file  Transportation Needs: Not on file  Physical Activity: Not on file  Stress: Not on file  Social Connections: Not on file    Allergies:  Allergies  Allergen Reactions  . Amoxicillin Swelling and Other (See Comments)    Oral swelling   . Doxycycline Nausea And Vomiting  . Levofloxacin Other (See Comments)    headache  . Propoxyphene N-Acetaminophen Nausea And Vomiting    Metabolic Disorder Labs: Lab Results  Component Value Date   HGBA1C 9.1 (A) 11/06/2020   MPG 229 05/21/2019   MPG 232 01/30/2019   No results found for: PROLACTIN Lab Results  Component Value Date   CHOL 350 (H) 11/06/2020   TRIG 297 (H) 11/06/2020   HDL 44 11/06/2020   CHOLHDL 8.0 (H) 11/06/2020   VLDL UNABLE TO CALCULATE IF TRIGLYCERIDE OVER 400 mg/dL 06/28/2009   LDLCALC 243  (H) 11/06/2020   LDLCALC 163 (H) 01/30/2019   Lab Results  Component Value Date   TSH 1.750 11/06/2020   TSH 2.77 02/18/2020    Therapeutic Level Labs: Lab Results  Component Value Date   LITHIUM 0.6 (L) 07/07/2016   LITHIUM 0.6 (L) 03/10/2016   No results found for: VALPROATE No components found for:  CBMZ  Current Medications: Current Outpatient Medications  Medication Sig Dispense Refill  . ACCU-CHEK GUIDE test strip USE TO TEST BLOOD SUGAR 4 TIMES DAILY. 100 strip 0  . albuterol (PROVENTIL HFA;VENTOLIN HFA) 108 (90 Base) MCG/ACT inhaler Inhale 2 puffs into the lungs every 6 (six) hours as needed for wheezing or shortness of breath. 1 Inhaler 0  . amLODipine (NORVASC)  10 MG tablet Take 1 tablet (10 mg total) by mouth daily. For high blood pressure 30 tablet 0  . aspirin EC 81 MG tablet Take 1 tablet (81 mg total) by mouth daily. Swallow whole. 30 tablet 11  . BD PEN NEEDLE NANO U/F 32G X 4 MM MISC USE AS DIRECTED TWICE DAILY 100 each 0  . blood glucose meter kit and supplies KIT 1 each by Does not apply route 4 (four) times daily. Dispense based on patient and insurance preference. Use up to four times daily as directed. (FOR ICD-9 250.00, 250.01). 1 each 0  . busPIRone (BUSPAR) 30 MG tablet Take 1 tablet (30 mg total) by mouth 3 (three) times daily. 270 tablet 2  . Cholecalciferol (VITAMIN D3) 125 MCG (5000 UT) CAPS Take 1 capsule (5,000 Units total) by mouth daily. 90 capsule 0  . DULoxetine (CYMBALTA) 60 MG capsule Take 1 capsule (60 mg total) by mouth 2 (two) times daily. 60 capsule 3  . furosemide (LASIX) 20 MG tablet TAKE 1 TABLET (20 MG TOTAL) BY MOUTH DAILY. 90 tablet 3  . glipiZIDE (GLUCOTROL) 10 MG tablet Take 1 tablet (10 mg total) by mouth 2 (two) times daily before a meal. 60 tablet 0  . hydrOXYzine (VISTARIL) 25 MG capsule Take 1 capsule (25 mg total) by mouth 3 (three) times daily as needed. 90 capsule 2  . insulin isophane & regular human (NOVOLIN 70/30 FLEXPEN)  (70-30) 100 UNIT/ML KwikPen Inject 80 Units into the skin 2 (two) times daily with a meal. 18 each 6  . levothyroxine (SYNTHROID, LEVOTHROID) 50 MCG tablet Take 1 tablet (50 mcg total) by mouth daily before breakfast. For hypothyroidism 30 tablet 0  . metoprolol succinate (TOPROL-XL) 25 MG 24 hr tablet TAKE 1 TABLET EVERY DAY (NEED MD APPOINTMENT FOR REFILLS) 15 tablet 0  . nitroGLYCERIN (NITROSTAT) 0.4 MG SL tablet Place 1 tablet (0.4 mg total) under the tongue every 5 (five) minutes as needed for chest pain. 25 tablet 3  . Potassium Chloride ER 20 MEQ TBCR Take 1 tablet by mouth daily. 60 tablet 0  . risperiDONE (RISPERDAL) 2 MG tablet Take 1 tablet (2 mg total) by mouth 2 (two) times daily. 180 tablet 2  . rosuvastatin (CRESTOR) 40 MG tablet Take 1 tablet (40 mg total) by mouth daily. 90 tablet 3  . traZODone (DESYREL) 100 MG tablet Take 2 tablets (200 mg total) by mouth at bedtime. 180 tablet 2  . Vitamin D, Ergocalciferol, (DRISDOL) 1.25 MG (50000 UNIT) CAPS capsule Take 1 capsule (50,000 Units total) by mouth every 7 (seven) days. 12 capsule 0   No current facility-administered medications for this visit.     Musculoskeletal: Strength & Muscle Tone: within normal limits Gait & Station: normal Patient leans: N/A  Psychiatric Specialty Exam: Review of Systems  Musculoskeletal: Positive for arthralgias, back pain and myalgias.  All other systems reviewed and are negative.   There were no vitals taken for this visit.There is no height or weight on file to calculate BMI.  General Appearance: NA  Eye Contact:  NA  Speech:  Clear and Coherent  Volume:  Normal  Mood:  Euthymic  Affect:  NA  Thought Process:  Goal Directed  Orientation:  Full (Time, Place, and Person)  Thought Content: WDL   Suicidal Thoughts:  No  Homicidal Thoughts:  No  Memory:  Immediate;   Good Recent;   Good Remote;   Fair  Judgement:  Good  Insight:  Fair  Psychomotor Activity:  Decreased  Concentration:   Concentration: Good and Attention Span: Good  Recall:  Good  Fund of Knowledge: Good  Language: Good  Akathisia:  No  Handed:  Right  AIMS (if indicated): not done  Assets:  Communication Skills Desire for Improvement Resilience Social Support  ADL's:  Intact  Cognition: WNL  Sleep:  Good   Screenings: AIMS   Flowsheet Row Admission (Discharged) from 07/04/2018 in [facility]  AIMS Total Score 0    AUDIT   Flowsheet Row Admission (Discharged) from 07/04/2018 in [facility]  Alcohol Use Disorder Identification Test Final Score (AUDIT) 0    MDI   Flowsheet Row Office Visit from 04/13/2016 in [facility]  Total Score (max 50) 25    PHQ2-9   Flowsheet Row Video Visit from 01/06/2021 in [facility] Video Visit from 12/12/2020 in [facility] [facility] Nutrition from 02/06/2019 [facility] Counselor from 06/21/2016 in [facility] [place] [facility]  PHQ-2 Total Score 1 5 0 4  PHQ-9 Total Score 4 17 -- 15    Flowsheet Row Video Visit from 01/06/2021 in [place] Video Visit from 12/12/2020 in [facility]-Admission (Discharged) from 07/24/2018 in [place] No Risk No Risk No Risk       Assessment and Plan: This patient is a 9-year-old female with a history of heart disease type 2 diabetes and bipolar disorder.  She is doing better with medication changes.  She will continue Cymbalta 60 mg twice daily for depression, BuSpar 30 mg 3 times daily for anxiety as well as hydroxyzine 25 mg 3 times daily for anxiety as needed, Risperdal 2 mg twice daily for mood stabilization and trazodone 200 mg at bedtime for  sleep.  She will return to see me in 3 months   [name], MD 01/06/2021, 9:33 AM

## 2021-01-08 ENCOUNTER — Other Ambulatory Visit: Payer: Self-pay | Admitting: Nurse Practitioner

## 2021-02-17 ENCOUNTER — Ambulatory Visit: Payer: Medicare HMO | Admitting: Nurse Practitioner

## 2021-03-03 ENCOUNTER — Other Ambulatory Visit (HOSPITAL_COMMUNITY): Payer: Self-pay

## 2021-03-10 ENCOUNTER — Telehealth (HOSPITAL_COMMUNITY): Payer: Self-pay | Admitting: *Deleted

## 2021-03-10 NOTE — Telephone Encounter (Signed)
Patient is needing refills for all of her medications provider prescribes for her. Patient appt was cancelled due to provider being on medical leave. Message was left to call office to resch appt.  

## 2021-03-11 ENCOUNTER — Other Ambulatory Visit (HOSPITAL_COMMUNITY): Payer: Self-pay | Admitting: Psychiatry

## 2021-03-11 MED ORDER — BUSPIRONE HCL 30 MG PO TABS: 30.0000 mg | ORAL_TABLET | Freq: Three times a day (TID) | ORAL | 2 refills | Status: DC

## 2021-03-11 MED ORDER — RISPERIDONE 2 MG PO TABS: 2.0000 mg | ORAL_TABLET | Freq: Two times a day (BID) | ORAL | 2 refills | Status: DC

## 2021-03-11 MED ORDER — DULOXETINE HCL 60 MG PO CPEP
60.0000 mg | ORAL_CAPSULE | Freq: Two times a day (BID) | ORAL | 3 refills | Status: AC
Start: 2021-03-11 — End: ?

## 2021-03-11 MED ORDER — TRAZODONE HCL 100 MG PO TABS: 200.0000 mg | ORAL_TABLET | Freq: Every day | ORAL | 2 refills | Status: DC

## 2021-03-11 MED ORDER — HYDROXYZINE PAMOATE 25 MG PO CAPS: 25.0000 mg | ORAL_CAPSULE | Freq: Three times a day (TID) | ORAL | 2 refills | Status: DC | PRN

## 2021-03-11 NOTE — Telephone Encounter (Signed)
sent 

## 2021-04-03 ENCOUNTER — Ambulatory Visit (INDEPENDENT_AMBULATORY_CARE_PROVIDER_SITE_OTHER): Payer: Medicare HMO | Admitting: Nurse Practitioner

## 2021-04-03 ENCOUNTER — Encounter: Payer: Self-pay | Admitting: Nurse Practitioner

## 2021-04-03 VITALS — BP 138/79 | HR 66 | Ht 65.0 in | Wt 267.0 lb

## 2021-04-03 DIAGNOSIS — E782 Mixed hyperlipidemia: Secondary | ICD-10-CM

## 2021-04-03 DIAGNOSIS — I10 Essential (primary) hypertension: Secondary | ICD-10-CM

## 2021-04-03 DIAGNOSIS — E039 Hypothyroidism, unspecified: Secondary | ICD-10-CM | POA: Diagnosis not present

## 2021-04-03 DIAGNOSIS — E559 Vitamin D deficiency, unspecified: Secondary | ICD-10-CM

## 2021-04-03 DIAGNOSIS — E1159 Type 2 diabetes mellitus with other circulatory complications: Secondary | ICD-10-CM

## 2021-04-03 LAB — POCT GLYCOSYLATED HEMOGLOBIN (HGB A1C): Hemoglobin A1C: 10.6 % — AB (ref 4.0–5.6)

## 2021-04-03 MED ORDER — NOVOLIN 70/30 FLEXPEN (70-30) 100 UNIT/ML ~~LOC~~ SUPN: 90.0000 [IU] | PEN_INJECTOR | Freq: Two times a day (BID) | SUBCUTANEOUS | 3 refills | Status: DC

## 2021-04-03 NOTE — Progress Notes (Signed)
04/03/2021   Endocrinology follow-up note   Subjective:    Patient ID: [name], female    DOB: July 24, 1961.  [name] is being seen in follow-up  for management of currently uncontrolled symptomatic diabetes requested by  Christain Sacramento, MD.   Past Medical History:  Diagnosis Date   Asthma    Back pain    CAD (coronary artery disease)    a. 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   Thyroid disease     Past Surgical History:  Procedure Laterality Date   TUBAL LIGATION     Bilateral    Social History   Socioeconomic History   Marital status: Widowed    Spouse name: Not on file   Number of children: Not on file   Years of education: Not on file   Highest education level: Not on file  Occupational History   Occupation: Disabled    Employer: UNEMPLOYED  Tobacco Use   Smoking status: Never   Smokeless tobacco: Never  Vaping Use   Vaping Use: Never used  Substance and Sexual Activity   Alcohol use: No    Alcohol/week: 0.0 standard drinks   Drug use: No    Comment: 05/13/2016 per pt no   Sexual activity: Yes    Birth control/protection: Surgical  Other Topics Concern   Not on file  Social History Narrative   Married   No regular exercise   Social Determinants of Health   Financial Resource Strain: Not on file  Food Insecurity: Not on file  Transportation Needs: Not on file  Physical Activity: Not on file  Stress: Not on file  Social Connections: Not on file    Family History  Problem Relation Age of Onset   Heart attack Maternal Uncle    Depression Maternal Uncle    Depression Mother     Alcohol abuse Maternal Grandfather    Alcohol abuse Maternal Uncle    Depression Maternal Grandmother     Outpatient Encounter Medications as of 04/03/2021  Medication Sig   ACCU-CHEK GUIDE test strip USE TO TEST BLOOD SUGAR 4 TIMES DAILY.   albuterol (PROVENTIL HFA;VENTOLIN HFA) 108 (90 Base) MCG/ACT inhaler Inhale 2 puffs into the lungs every 6 (six) hours as needed for wheezing or shortness of breath.   amLODipine (NORVASC) 10 MG tablet Take 1 tablet (10 mg total) by mouth daily. For high blood pressure   aspirin EC 81 MG tablet Take 1 tablet (81 mg total) by mouth daily. Swallow whole.   BD PEN NEEDLE NANO U/F 32G X 4 MM MISC USE AS DIRECTED TWICE DAILY   blood glucose meter kit and supplies KIT 1 each by Does not apply route 4 (four) times daily. Dispense based on patient and insurance preference. Use up to four times daily as directed. (FOR ICD-9 250.00, 250.01).   busPIRone (BUSPAR) 30 MG tablet Take 1 tablet (30 mg total) by mouth 3 (three) times daily.   Cholecalciferol (VITAMIN D3) 125 MCG (5000 UT) CAPS Take 1 capsule (5,000 Units total) by mouth daily.   DULoxetine (CYMBALTA) 60 MG capsule Take  1 capsule (60 mg total) by mouth 2 (two) times daily.   furosemide (LASIX) 20 MG tablet TAKE 1 TABLET (20 MG TOTAL) BY MOUTH DAILY.   glipiZIDE (GLUCOTROL) 10 MG tablet Take 1 tablet (10 mg total) by mouth 2 (two) times daily before a meal.   hydrOXYzine (VISTARIL) 25 MG capsule Take 1 capsule (25 mg total) by mouth 3 (three) times daily as needed.   insulin isophane & regular human (NOVOLIN 70/30 FLEXPEN) (70-30) 100 UNIT/ML KwikPen Inject 90 Units into the skin 2 (two) times daily before lunch and supper.   levothyroxine (SYNTHROID, LEVOTHROID) 50 MCG tablet Take 1 tablet (50 mcg total) by mouth daily before breakfast. For hypothyroidism   metoprolol succinate (TOPROL-XL) 25 MG 24 hr tablet TAKE 1 TABLET EVERY DAY (NEED MD APPOINTMENT FOR REFILLS)   nitroGLYCERIN (NITROSTAT) 0.4 MG SL tablet  Place 1 tablet (0.4 mg total) under the tongue every 5 (five) minutes as needed for chest pain.   Potassium Chloride ER 20 MEQ TBCR Take 1 tablet by mouth daily.   risperiDONE (RISPERDAL) 2 MG tablet Take 1 tablet (2 mg total) by mouth 2 (two) times daily.   rosuvastatin (CRESTOR) 40 MG tablet Take 1 tablet (40 mg total) by mouth daily.   traZODone (DESYREL) 100 MG tablet Take 2 tablets (200 mg total) by mouth at bedtime.   Vitamin D, Ergocalciferol, (DRISDOL) 1.25 MG (50000 UNIT) CAPS capsule Take 1 capsule (50,000 Units total) by mouth every 7 (seven) days.   [DISCONTINUED] NOVOLIN 70/30 FLEXPEN (70-30) 100 UNIT/ML KwikPen INJECT 80 UNITS INTO THE SKIN AT BREAKFAST AND 80 UNITS AT DINNER IN THE EVENING.   No facility-administered encounter medications on file as of 04/03/2021.    ALLERGIES: Allergies  Allergen Reactions   Amoxicillin Swelling and Other (See Comments)    Oral swelling    Doxycycline Nausea And Vomiting   Levofloxacin Other (See Comments)    headache   Propoxyphene N-Acetaminophen Nausea And Vomiting    VACCINATION STATUS: Immunization History  Administered Date(s) Administered   Influenza,inj,Quad PF,6+ Mos 07/18/2018   Pneumococcal Polysaccharide-23 07/18/2018    Diabetes She presents for her follow-up diabetic visit. She has type 2 diabetes mellitus. Onset time: She was diagnosed at approximate age of 21 years. Her disease course has been worsening. There are no hypoglycemic associated symptoms. Pertinent negatives for hypoglycemia include no confusion, headaches, pallor, seizures or tremors. Associated symptoms include blurred vision, fatigue, polydipsia, polyuria and weight loss. Pertinent negatives for diabetes include no chest pain and no polyphagia. There are no hypoglycemic complications. Symptoms are stable. Diabetic complications include heart disease. (Status post triple coronary bypass in November 2019.) Risk factors for coronary artery disease include  diabetes mellitus, dyslipidemia, family history, hypertension, obesity, sedentary lifestyle and tobacco exposure. Current diabetic treatment includes insulin injections and oral agent (monotherapy). She is compliant with treatment some of the time. Her weight is decreasing steadily. She is following a generally unhealthy diet. When asked about meal planning, she reported none. She has not had a previous visit with a dietitian. She rarely participates in exercise. Her home blood glucose trend is fluctuating minimally. (She presents today with her logs, no meter, showing slightly above target fasting and at goal postprandial glycemic profile.  According to her logs, she has been checking her glucose 7 times per day although she was only instructed to check them 3 times daily.  Her POCT A1c today is 10.6%, increasing from last visit, making me question the authenticity of her  glucose logs as those numbers dont match her A1c today.  She denies any hypoglycemia.) An ACE inhibitor/angiotensin II receptor blocker is being taken. She does not see a podiatrist.Eye exam is current.  Hypertension This is a chronic problem. The current episode started more than 1 year ago. The problem has been waxing and waning since onset. The problem is controlled. Associated symptoms include blurred vision. Pertinent negatives include no chest pain, headaches, palpitations or shortness of breath. Agents associated with hypertension include thyroid hormones. Risk factors for coronary artery disease include dyslipidemia, diabetes mellitus, obesity, sedentary lifestyle, smoking/tobacco exposure and family history. Past treatments include calcium channel blockers, diuretics and beta blockers. The current treatment provides moderate improvement. Compliance problems include diet and exercise.  Hypertensive end-organ damage includes CAD/MI. Identifiable causes of hypertension include chronic renal disease.  Hyperlipidemia This is a chronic  problem. The current episode started more than 1 year ago. Exacerbating diseases include chronic renal disease, diabetes, hypothyroidism and obesity. Factors aggravating her hyperlipidemia include fatty foods. Pertinent negatives include no chest pain, myalgias or shortness of breath. Current antihyperlipidemic treatment includes statins. The current treatment provides moderate improvement of lipids. Compliance problems include adherence to diet and adherence to exercise.  Risk factors for coronary artery disease include diabetes mellitus, dyslipidemia, family history, obesity, hypertension, a sedentary lifestyle and post-menopausal.   Review of systems  Constitutional: + steadily decreasing body weight,  current Body mass index is 44.43 kg/m. , + fatigue, no subjective hyperthermia, no subjective hypothermia Eyes: + blurry vision, no xerophthalmia ENT: no sore throat, no nodules palpated in throat, no dysphagia/odynophagia, no hoarseness Cardiovascular: no chest pain, no shortness of breath, no palpitations, no leg swelling Respiratory: no cough, no shortness of breath Gastrointestinal: no nausea/vomiting/diarrhea Genitourinary: + polyuria Musculoskeletal: no muscle/joint aches Skin: no rashes, no hyperemia Neurological: no tremors, no numbness, no tingling, no dizziness Psychiatric: no depression, no anxiety   Objective:    BP 138/79   Pulse 66   Ht 5' 5"  (1.651 m)   Wt 267 lb (121.1 kg)   BMI 44.43 kg/m   Wt Readings from Last 3 Encounters:  04/03/21 267 lb (121.1 kg)  12/25/20 277 lb (125.6 kg)  11/20/20 284 lb (128.8 kg)    BP Readings from Last 3 Encounters:  04/03/21 138/79  12/25/20 132/74  11/20/20 (!) 148/91     Physical Exam- Limited  Constitutional:  Body mass index is 44.43 kg/m. , not in acute distress, normal state of mind Eyes:  EOMI, no exophthalmos Neck: Supple Cardiovascular: RRR, no murmurs, rubs, or gallops, no edema Respiratory: Adequate breathing  efforts, no crackles, rales, rhonchi, or wheezing Musculoskeletal: no gross deformities, strength intact in all four extremities, no gross restriction of joint movements Skin:  no rashes, no hyperemia Neurological: no tremor with outstretched hands    POCT ABI Results 04/03/21   Right ABI:  1.11      Left ABI:  1.24  Right leg systolic / diastolic: 017/49 mmHg Left leg systolic / diastolic: 449/67 mmHg  Arm systolic / diastolic: 591/63 mmHG  Detailed report will be scanned into patient chart.     CMP ( most recent) CMP     Component Value Date/Time   NA 137 11/06/2020 1524   K 4.4 11/06/2020 1524   CL 92 (L) 11/06/2020 1524   CO2 27 11/06/2020 1524   GLUCOSE 339 (H) 11/06/2020 1524   GLUCOSE 396 (H) 02/18/2020 1146   BUN 23 11/06/2020 1524   CREATININE 1.12 (H)  11/06/2020 1524   CREATININE 1.12 (H) 02/18/2020 1146   CALCIUM 9.7 11/06/2020 1524   PROT 6.9 11/06/2020 1524   ALBUMIN 4.1 11/06/2020 1524   AST 14 11/06/2020 1524   ALT 13 11/06/2020 1524   ALKPHOS 91 11/06/2020 1524   BILITOT 0.4 11/06/2020 1524   GFRNONAA 54 (L) 11/06/2020 1524   GFRNONAA 54 (L) 02/18/2020 1146   GFRAA 62 11/06/2020 1524   GFRAA 62 02/18/2020 1146     Diabetic Labs (most recent): Lab Results  Component Value Date   HGBA1C 10.6 (A) 04/03/2021   HGBA1C 9.1 (A) 11/06/2020   HGBA1C 10.5 (A) 02/18/2020     Lipid Panel ( most recent) Lipid Panel     Component Value Date/Time   CHOL 350 (H) 11/06/2020 1524   TRIG 297 (H) 11/06/2020 1524   HDL 44 11/06/2020 1524   CHOLHDL 8.0 (H) 11/06/2020 1524   CHOLHDL 5.3 (H) 01/30/2019 0934   VLDL UNABLE TO CALCULATE IF TRIGLYCERIDE OVER 400 mg/dL 06/28/2009 0611   LDLCALC 243 (H) 11/06/2020 1524   LDLCALC 163 (H) 01/30/2019 0934     Assessment & Plan:   1) DM type 2 causing vascular disease ([place])  - [name] has currently uncontrolled symptomatic type 2 DM since 60 years of age.  She presents today with her logs, no  meter, showing slightly above target fasting and at goal postprandial glycemic profile.  According to her logs, she has been checking her glucose 7 times per day although she was only instructed to check them 3 times daily.  Her POCT A1c today is 10.6%, increasing from last visit, making me question the authenticity of her glucose logs as those numbers dont match her A1c today.  She denies any hypoglycemia.    -her diabetes is complicated by coronary artery disease status post triple bypass in November 2019, morbid obesity, history of smoking, sedentary life and she remains at a high risk for more acute and chronic complications which include CAD, CVA, CKD, retinopathy, and neuropathy. These are all discussed in detail with her.  - Nutritional counseling repeated at each appointment due to patients tendency to fall back in to old habits.  - The patient admits there is a room for improvement in their diet and drink choices. -  Suggestion is made for the patient to avoid simple carbohydrates from their diet including Cakes, Sweet Desserts / Pastries, Ice Cream, Soda (diet and regular), Sweet Tea, Candies, Chips, Cookies, Sweet Pastries, Store Bought Juices, Alcohol in Excess of 1-2 drinks a day, Artificial Sweeteners, Coffee Creamer, and "Sugar-free" Products. This will help patient to have stable blood glucose profile and potentially avoid unintended weight gain.   - I encouraged the patient to switch to unprocessed or minimally processed complex starch and increased protein intake (animal or plant source), fruits, and vegetables.   - Patient is advised to stick to a routine mealtimes to eat 3 meals a day and avoid unnecessary snacks (to snack only to correct hypoglycemia).  - I have approached her with the following individualized plan to manage diabetes and patient agrees:   -Based on her current glycemic burden as well as her comorbidities, she would need intensive treatment with basal/bolus insulin  in order for her to achieve control of diabetes to target.  -She will tolerate slight adjustment in her medication to 70/30 90 units BID with meals if glucose is above 90 and she is eating.  She can continue Glipizide 10 mg po twice daily  with meals.   -She is encouraged to continue monitoring blood glucose 3-4 times daily, before meals and before bed, and to call the clinic if she has readings less than 70 or greater than 300 for 3 tests in a row.  -She could greatly benefit from CGM device, however it was prescribed for her once before and denied by her insurance.  May attempt to try again if she shows proper engagement in monitoring her glucose.  -She could not afford the copay for Trulicity.  - Patient specific target  A1c;  LDL, HDL, Triglycerides, and  Waist Circumference were discussed in detail.  2) Blood Pressure /Hypertension:  -Her blood pressure is controlled to target.  She is advised to continue Norvasc 10 mg po daily, Lasix 20 mg po daily, and Metoprolol 25 mg po daily.  3) Lipids/Hyperlipidemia:   Her most recent lipid panel from 11/06/20 shows uncontrolled LDL of 243 (worsening) and elevated triglycerides of 297 (worsening).  Will change her from Lipitor to Crestor 40 mg po daily at bedtime.  Side effects and precautions discussed with her.  4)  Weight/Diet:   Her Body mass index is 44.43 kg/m. -   clearly complicating her diabetes care.  She is a candidate for modest weight loss.  I discussed with her the fact that loss of 5 - 10% of her  current body weight will have the most impact on her diabetes management.  CDE Consult will be initiated . Exercise, and detailed carbohydrates information provided  -  detailed on discharge instructions.  5) Hypothyroidism: She does not have recent TFTs to review.   She is advised to continue Levothyroxine 50 mcg po daily before breakfast. Will recheck TFTs prior to next visit.    Her recent thyroid function tests were consistent with  appropriate replacement.  - We discussed about the correct intake of her thyroid hormone, on empty stomach at fasting, with water, separated by at least 30 minutes from breakfast and other medications,  and separated by more than 4 hours from calcium, iron, multivitamins, acid reflux medications (PPIs). -Patient is made aware of the fact that thyroid hormone replacement is needed for life, dose to be adjusted by periodic monitoring of thyroid function tests.  6) Vitamin D  Deficiency -Her most recent vitamin D level on 11/06/20 was 14.8.  She is not currently taking any supplementation.  She is currently on Ergocalciferol 50000 units PO weekly x 12 weeks.   7) Chronic Care/Health Maintenance: -she is on Statin medications and  is encouraged to initiate and continue to follow up with Ophthalmology, Dentist,  Podiatrist at least yearly or according to recommendations, and advised to stay away from smoking. I have recommended yearly flu vaccine and pneumonia vaccine at least every 5 years; moderate intensity exercise for up to 150 minutes weekly; and  sleep for at least 7 hours a day.  - she is advised to maintain close follow up with Christain Sacramento, MD for primary care needs, as well as her other providers for optimal and coordinated care.    I spent 44 minutes in the care of the patient today including review of labs from [facility], Lipids, Thyroid Function, Hematology (current and previous including abstractions from other facilities); face-to-face time discussing  her blood glucose readings/logs, discussing hypoglycemia and hyperglycemia episodes and symptoms, medications doses, her options of short and long term treatment based on the latest standards of care / guidelines;  discussion about incorporating lifestyle medicine;  and documenting the  encounter.    Please refer to Patient Instructions for Blood Glucose Monitoring and Insulin/Medications Dosing Guide"  in media tab for additional information.  Please  also refer to " Patient Self Inventory" in the Media  tab for reviewed elements of pertinent patient history.  [name] participated in the discussions, expressed understanding, and voiced agreement with the above plans.  All questions were answered to her satisfaction. she is encouraged to contact clinic should she have any questions or concerns prior to her return visit.    Follow up plan: - Return in about 1 month (around 05/04/2021) for Diabetes F/U, Bring meter and logs.  Rayetta Pigg, [facility] [address] [place], [place] [address] Phone: [phone] Fax: [phone]  04/03/2021, 11:44 AM

## 2021-04-03 NOTE — Patient Instructions (Signed)

## 2021-04-07 ENCOUNTER — Telehealth (HOSPITAL_COMMUNITY): Payer: Medicare HMO | Admitting: Psychiatry

## 2021-05-06 ENCOUNTER — Ambulatory Visit: Payer: Medicare HMO | Admitting: Nurse Practitioner

## 2021-07-13 ENCOUNTER — Other Ambulatory Visit: Payer: Self-pay | Admitting: "Endocrinology

## 2021-07-26 ENCOUNTER — Other Ambulatory Visit: Payer: Self-pay | Admitting: Student

## 2021-09-15 ENCOUNTER — Emergency Department (HOSPITAL_BASED_OUTPATIENT_CLINIC_OR_DEPARTMENT_OTHER): Payer: Medicare Other

## 2021-09-15 ENCOUNTER — Encounter (HOSPITAL_BASED_OUTPATIENT_CLINIC_OR_DEPARTMENT_OTHER): Payer: Self-pay | Admitting: *Deleted

## 2021-09-15 ENCOUNTER — Other Ambulatory Visit: Payer: Self-pay

## 2021-09-15 ENCOUNTER — Inpatient Hospital Stay (HOSPITAL_BASED_OUTPATIENT_CLINIC_OR_DEPARTMENT_OTHER)
Admission: EM | Admit: 2021-09-15 | Discharge: 2021-09-23 | DRG: 065 | Disposition: A | Discharge status: Home Health | Payer: Medicare Other | Attending: Internal Medicine | Admitting: Internal Medicine

## 2021-09-15 DIAGNOSIS — Z880 Allergy status to penicillin: Secondary | ICD-10-CM

## 2021-09-15 DIAGNOSIS — E1122 Type 2 diabetes mellitus with diabetic chronic kidney disease: Secondary | ICD-10-CM | POA: Diagnosis not present

## 2021-09-15 DIAGNOSIS — F319 Bipolar disorder, unspecified: Secondary | ICD-10-CM | POA: Diagnosis present

## 2021-09-15 DIAGNOSIS — E11649 Type 2 diabetes mellitus with hypoglycemia without coma: Secondary | ICD-10-CM | POA: Diagnosis not present

## 2021-09-15 DIAGNOSIS — E7800 Pure hypercholesterolemia, unspecified: Secondary | ICD-10-CM | POA: Diagnosis not present

## 2021-09-15 DIAGNOSIS — Z7982 Long term (current) use of aspirin: Secondary | ICD-10-CM

## 2021-09-15 DIAGNOSIS — I2510 Atherosclerotic heart disease of native coronary artery without angina pectoris: Secondary | ICD-10-CM | POA: Diagnosis not present

## 2021-09-15 DIAGNOSIS — Z881 Allergy status to other antibiotic agents status: Secondary | ICD-10-CM

## 2021-09-15 DIAGNOSIS — Z818 Family history of other mental and behavioral disorders: Secondary | ICD-10-CM | POA: Diagnosis not present

## 2021-09-15 DIAGNOSIS — R29701 NIHSS score 1: Secondary | ICD-10-CM | POA: Diagnosis not present

## 2021-09-15 DIAGNOSIS — E1169 Type 2 diabetes mellitus with other specified complication: Secondary | ICD-10-CM | POA: Diagnosis present

## 2021-09-15 DIAGNOSIS — Z8249 Family history of ischemic heart disease and other diseases of the circulatory system: Secondary | ICD-10-CM | POA: Diagnosis not present

## 2021-09-15 DIAGNOSIS — N1831 Chronic kidney disease, stage 3a: Secondary | ICD-10-CM | POA: Diagnosis present

## 2021-09-15 DIAGNOSIS — E039 Hypothyroidism, unspecified: Secondary | ICD-10-CM | POA: Diagnosis not present

## 2021-09-15 DIAGNOSIS — Z951 Presence of aortocoronary bypass graft: Secondary | ICD-10-CM | POA: Diagnosis not present

## 2021-09-15 DIAGNOSIS — Z79899 Other long term (current) drug therapy: Secondary | ICD-10-CM

## 2021-09-15 DIAGNOSIS — Z888 Allergy status to other drugs, medicaments and biological substances status: Secondary | ICD-10-CM | POA: Diagnosis not present

## 2021-09-15 DIAGNOSIS — I5032 Chronic diastolic (congestive) heart failure: Secondary | ICD-10-CM | POA: Diagnosis present

## 2021-09-15 DIAGNOSIS — I6381 Other cerebral infarction due to occlusion or stenosis of small artery: Secondary | ICD-10-CM | POA: Diagnosis not present

## 2021-09-15 DIAGNOSIS — Z6841 Body Mass Index (BMI) 40.0 and over, adult: Secondary | ICD-10-CM

## 2021-09-15 DIAGNOSIS — Z823 Family history of stroke: Secondary | ICD-10-CM

## 2021-09-15 DIAGNOSIS — R4701 Aphasia: Secondary | ICD-10-CM | POA: Diagnosis present

## 2021-09-15 DIAGNOSIS — K5900 Constipation, unspecified: Secondary | ICD-10-CM

## 2021-09-15 DIAGNOSIS — Z20822 Contact with and (suspected) exposure to covid-19: Secondary | ICD-10-CM | POA: Diagnosis present

## 2021-09-15 DIAGNOSIS — I10 Essential (primary) hypertension: Secondary | ICD-10-CM

## 2021-09-15 DIAGNOSIS — Z79890 Hormone replacement therapy (postmenopausal): Secondary | ICD-10-CM

## 2021-09-15 DIAGNOSIS — I130 Hypertensive heart and chronic kidney disease with heart failure and stage 1 through stage 4 chronic kidney disease, or unspecified chronic kidney disease: Secondary | ICD-10-CM | POA: Diagnosis present

## 2021-09-15 DIAGNOSIS — R4182 Altered mental status, unspecified: Secondary | ICD-10-CM

## 2021-09-15 DIAGNOSIS — E782 Mixed hyperlipidemia: Secondary | ICD-10-CM | POA: Diagnosis present

## 2021-09-15 DIAGNOSIS — Z794 Long term (current) use of insulin: Secondary | ICD-10-CM

## 2021-09-15 DIAGNOSIS — M069 Rheumatoid arthritis, unspecified: Secondary | ICD-10-CM | POA: Diagnosis not present

## 2021-09-15 DIAGNOSIS — J45909 Unspecified asthma, uncomplicated: Secondary | ICD-10-CM | POA: Diagnosis present

## 2021-09-15 DIAGNOSIS — E1165 Type 2 diabetes mellitus with hyperglycemia: Secondary | ICD-10-CM

## 2021-09-15 DIAGNOSIS — I25810 Atherosclerosis of coronary artery bypass graft(s) without angina pectoris: Secondary | ICD-10-CM | POA: Diagnosis present

## 2021-09-15 DIAGNOSIS — E875 Hyperkalemia: Secondary | ICD-10-CM | POA: Diagnosis not present

## 2021-09-15 DIAGNOSIS — Z7984 Long term (current) use of oral hypoglycemic drugs: Secondary | ICD-10-CM

## 2021-09-15 DIAGNOSIS — R4587 Impulsiveness: Secondary | ICD-10-CM | POA: Diagnosis present

## 2021-09-15 LAB — CBC
HCT: 43 % (ref 36.0–46.0)
Hemoglobin: 14.8 g/dL (ref 12.0–15.0)
MCH: 28.3 pg (ref 26.0–34.0)
MCHC: 34.4 g/dL (ref 30.0–36.0)
MCV: 82.2 fL (ref 80.0–100.0)
Platelets: 265 10*3/uL (ref 150–400)
RBC: 5.23 MIL/uL — ABNORMAL HIGH (ref 3.87–5.11)
RDW: 12.5 % (ref 11.5–15.5)
WBC: 6.8 10*3/uL (ref 4.0–10.5)
nRBC: 0 % (ref 0.0–0.2)

## 2021-09-15 LAB — I-STAT VENOUS BLOOD GAS, ED
Acid-Base Excess: 6 mmol/L — ABNORMAL HIGH (ref 0.0–2.0)
Acid-Base Excess: 8 mmol/L — ABNORMAL HIGH (ref 0.0–2.0)
Bicarbonate: 32 mmol/L — ABNORMAL HIGH (ref 20.0–28.0)
Bicarbonate: 33.7 mmol/L — ABNORMAL HIGH (ref 20.0–28.0)
Calcium, Ion: 1.09 mmol/L — ABNORMAL LOW (ref 1.15–1.40)
Calcium, Ion: 1.13 mmol/L — ABNORMAL LOW (ref 1.15–1.40)
HCT: 39 % (ref 36.0–46.0)
HCT: 42 % (ref 36.0–46.0)
Hemoglobin: 13.3 g/dL (ref 12.0–15.0)
Hemoglobin: 14.3 g/dL (ref 12.0–15.0)
O2 Saturation: 100 %
O2 Saturation: 84 %
Patient temperature: 98.2
Potassium: 5.1 mmol/L (ref 3.5–5.1)
Potassium: 7.3 mmol/L (ref 3.5–5.1)
Sodium: 133 mmol/L — ABNORMAL LOW (ref 135–145)
Sodium: 136 mmol/L (ref 135–145)
TCO2: 34 mmol/L — ABNORMAL HIGH (ref 22–32)
TCO2: 35 mmol/L — ABNORMAL HIGH (ref 22–32)
pCO2, Ven: 49.2 mmHg (ref 44.0–60.0)
pCO2, Ven: 50.3 mmHg (ref 44.0–60.0)
pH, Ven: 7.42 (ref 7.250–7.430)
pH, Ven: 7.434 — ABNORMAL HIGH (ref 7.250–7.430)
pO2, Ven: 185 mmHg — ABNORMAL HIGH (ref 32.0–45.0)
pO2, Ven: 48 mmHg — ABNORMAL HIGH (ref 32.0–45.0)

## 2021-09-15 LAB — I-STAT CHEM 8, ED
BUN: 36 mg/dL — ABNORMAL HIGH (ref 8–23)
Calcium, Ion: 1.13 mmol/L — ABNORMAL LOW (ref 1.15–1.40)
Chloride: 99 mmol/L (ref 98–111)
Creatinine, Ser: 1.1 mg/dL — ABNORMAL HIGH (ref 0.44–1.00)
Glucose, Bld: 232 mg/dL — ABNORMAL HIGH (ref 70–99)
HCT: 40 % (ref 36.0–46.0)
Hemoglobin: 13.6 g/dL (ref 12.0–15.0)
Potassium: 5.1 mmol/L (ref 3.5–5.1)
Sodium: 137 mmol/L (ref 135–145)
TCO2: 33 mmol/L — ABNORMAL HIGH (ref 22–32)

## 2021-09-15 LAB — COMPREHENSIVE METABOLIC PANEL
ALT: 18 U/L (ref 0–44)
AST: 16 U/L (ref 15–41)
Albumin: 3.9 g/dL (ref 3.5–5.0)
Alkaline Phosphatase: 82 U/L (ref 38–126)
Anion gap: 9 (ref 5–15)
BUN: 28 mg/dL — ABNORMAL HIGH (ref 8–23)
CO2: 28 mmol/L (ref 22–32)
Calcium: 9.2 mg/dL (ref 8.9–10.3)
Chloride: 98 mmol/L (ref 98–111)
Creatinine, Ser: 1.21 mg/dL — ABNORMAL HIGH (ref 0.44–1.00)
GFR, Estimated: 51 mL/min — ABNORMAL LOW (ref >60)
Glucose, Bld: 314 mg/dL — ABNORMAL HIGH (ref 70–99)
Potassium: 4.7 mmol/L (ref 3.5–5.1)
Sodium: 135 mmol/L (ref 135–145)
Total Bilirubin: 0.5 mg/dL (ref 0.3–1.2)
Total Protein: 7.6 g/dL (ref 6.5–8.1)

## 2021-09-15 LAB — OSMOLALITY: Osmolality: 308 mOsm/kg — ABNORMAL HIGH (ref 275–295)

## 2021-09-15 LAB — RESP PANEL BY RT-PCR (FLU A&B, COVID) ARPGX2
Influenza A by PCR: NEGATIVE
Influenza B by PCR: NEGATIVE
SARS Coronavirus 2 by RT PCR: NEGATIVE

## 2021-09-15 LAB — LIPASE, BLOOD: Lipase: 40 U/L (ref 11–51)

## 2021-09-15 LAB — T4, FREE: Free T4: 0.97 ng/dL (ref 0.61–1.12)

## 2021-09-15 LAB — TSH: TSH: 1.089 u[IU]/mL (ref 0.350–4.500)

## 2021-09-15 LAB — TROPONIN I (HIGH SENSITIVITY)
Troponin I (High Sensitivity): 6 ng/L (ref <18)
Troponin I (High Sensitivity): 7 ng/L (ref <18)

## 2021-09-15 LAB — SALICYLATE LEVEL: Salicylate Lvl: <7 mg/dL — ABNORMAL LOW (ref 7.0–30.0)

## 2021-09-15 LAB — PROTIME-INR
INR: 0.9 (ref 0.8–1.2)
Prothrombin Time: 12.6 seconds (ref 11.4–15.2)

## 2021-09-15 LAB — AMMONIA: Ammonia: 36 umol/L — ABNORMAL HIGH (ref 9–35)

## 2021-09-15 LAB — BRAIN NATRIURETIC PEPTIDE: B Natriuretic Peptide: 82.9 pg/mL (ref 0.0–100.0)

## 2021-09-15 LAB — ACETAMINOPHEN LEVEL: Acetaminophen (Tylenol), Serum: <10 ug/mL — ABNORMAL LOW (ref 10–30)

## 2021-09-15 MED ORDER — LORAZEPAM 2 MG/ML IJ SOLN
1.0000 mg | Freq: Once | INTRAMUSCULAR | Status: AC | PRN
  Administered 2021-09-16: 03:00:00 1 mg via INTRAVENOUS
  Filled 2021-09-15: qty 1

## 2021-09-15 MED ORDER — INSULIN ASPART 100 UNIT/ML IJ SOLN
5.0000 [IU] | Freq: Once | INTRAMUSCULAR | Status: AC
  Administered 2021-09-15: 23:00:00 5 [IU] via SUBCUTANEOUS

## 2021-09-15 MED ORDER — SODIUM CHLORIDE 0.9 % IV BOLUS
1000.0000 mL | Freq: Once | INTRAVENOUS | Status: AC
  Administered 2021-09-15: 23:00:00 1000 mL via INTRAVENOUS

## 2021-09-15 NOTE — ED Provider Notes (Addendum)
[facility] EMERGENCY DEPARTMENT Provider Note   CSN: [id number] Arrival date & time: 09/15/21  1618     History Chief Complaint  Patient presents with   Altered Mental Status    [name] is a 60 y.o. female.  This is a 60 y.o. female  with significant medical history as below, including HTN, CAD, DM2 who presents to the ED with complaint of confusion.  Patient lives alone.  Per family last known normal was around 4 PM yesterday afternoon.  Daughter received a phone call from the patient around noon today and she seemed confused, difficulty speaking from her baseline.  They went to primary care doctor's office who requested to come to the ER for evaluation.   Patient with no acute complaints this time however the daughter reports that she is having difficulty forming her words and sentences.  No numbness, tingling, falls, difficulty with ambulation.  No Chest pain, dyspnea, fevers or chills.  No history of prior CVA.  No recent medication changes, recent travel, sick contacts. Has not experienced these symptoms in the past. Taking home medications as prescribed.   Level 5 caveat confusion   The history is provided by the patient and a relative. No language interpreter was used.  Altered Mental Status Associated symptoms: no abdominal pain, no fever, no headaches, no nausea, no palpitations and no rash       Past Medical History:  Diagnosis Date   Asthma    Back pain    CAD (coronary artery disease)    a. 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   Thyroid disease     Patient Active Problem List   Diagnosis Date Noted   Vitamin D  deficiency 02/14/2019   Mixed hyperlipidemia 01/30/2019   NSTEMI (non-ST elevated myocardial infarction) ([place]) 07/24/2018   DM type 2 causing vascular disease ([place]) 07/18/2018   Hypothyroidism 07/18/2018   Chest pain 07/17/2018   Bipolar I disorder, current or most recent episode manic, with psychotic features with mixed features ([place]) 07/04/2018   Bipolar disorder, unspecified ([place]) 02/28/2012   Bipolar 1 disorder, depressed ([place]) 12/27/2011   OBESITY, MORBID 07/07/2009   OTHER AND UNSPECIFIED BIPOLAR DISORDERS 07/07/2009   Essential hypertension, benign 07/07/2009   ASTHMA 07/07/2009   CHEST PAIN 07/07/2009    Past Surgical History:  Procedure Laterality Date   TUBAL LIGATION     Bilateral     OB History     Gravida  2   Para  2   Term  2   Preterm      AB      Living  2      SAB      IAB      Ectopic      Multiple      Live Births              Family History  Problem Relation Age of Onset   Heart attack Maternal Uncle    Depression Maternal Uncle    Depression Mother    Alcohol abuse Maternal Grandfather    Alcohol abuse Maternal Uncle  Depression Maternal Grandmother     Social History   Tobacco Use   Smoking status: Never   Smokeless tobacco: Never  Vaping Use   Vaping Use: Never used  Substance Use Topics   Alcohol use: No    Alcohol/week: 0.0 standard drinks   Drug use: No    Comment: 05/13/2016 per pt no    Home Medications Prior to Admission medications   Medication Sig Start Date End Date Taking? Authorizing Provider  ACCU-CHEK GUIDE test strip USE TO TEST BLOOD SUGAR 4 TIMES DAILY. 11/25/20   Brita Romp, NP  albuterol (PROVENTIL HFA;VENTOLIN HFA) 108 (90 Base) MCG/ACT inhaler Inhale 2 puffs into the lungs every 6 (six) hours as needed for wheezing or shortness of breath. 07/07/18   Money, Lowry Ram, FNP  amLODipine (NORVASC) 10 MG tablet Take 1 tablet (10 mg total) by mouth daily. For high blood pressure 07/07/18    Money, Lowry Ram, FNP  aspirin EC 81 MG tablet Take 1 tablet (81 mg total) by mouth daily. Swallow whole. 12/25/20   Strader, Fransisco Hertz, PA-C  BD PEN NEEDLE NANO U/F 32G X 4 MM MISC USE AS DIRECTED TWICE DAILY 07/14/21   Nida, Marella Chimes, MD  blood glucose meter kit and supplies KIT 1 each by Does not apply route 4 (four) times daily. Dispense based on patient and insurance preference. Use up to four times daily as directed. (FOR ICD-9 250.00, 250.01). 03/06/20   Cassandria Anger, MD  busPIRone (BUSPAR) 30 MG tablet Take 1 tablet (30 mg total) by mouth 3 (three) times daily. 03/11/21   Cloria Spring, MD  Cholecalciferol (VITAMIN D3) 125 MCG (5000 UT) CAPS Take 1 capsule (5,000 Units total) by mouth daily. 02/14/19   Cassandria Anger, MD  DULoxetine (CYMBALTA) 60 MG capsule Take 1 capsule (60 mg total) by mouth 2 (two) times daily. 03/11/21   Cloria Spring, MD  furosemide (LASIX) 20 MG tablet TAKE 1 TABLET (20 MG TOTAL) BY MOUTH DAILY. 10/03/19 10/02/20  Herminio Commons, MD  glipiZIDE (GLUCOTROL) 10 MG tablet Take 1 tablet (10 mg total) by mouth 2 (two) times daily before a meal. 07/07/18   Money, Lowry Ram, FNP  hydrOXYzine (VISTARIL) 25 MG capsule Take 1 capsule (25 mg total) by mouth 3 (three) times daily as needed. 03/11/21   Cloria Spring, MD  insulin isophane & regular human (NOVOLIN 70/30 FLEXPEN) (70-30) 100 UNIT/ML KwikPen Inject 90 Units into the skin 2 (two) times daily before lunch and supper. 04/03/21   Brita Romp, NP  levothyroxine (SYNTHROID, LEVOTHROID) 50 MCG tablet Take 1 tablet (50 mcg total) by mouth daily before breakfast. For hypothyroidism 07/07/18   Money, Lowry Ram, FNP  metoprolol succinate (TOPROL-XL) 25 MG 24 hr tablet TAKE 1 TABLET EVERY DAY (NEED CARDIOLOGY APPOINTMENT FOR REFILLS) 07/27/21   Ahmed Prima, Fransisco Hertz, PA-C  nitroGLYCERIN (NITROSTAT) 0.4 MG SL tablet Place 1 tablet (0.4 mg total) under the tongue every 5 (five) minutes as needed for chest pain.  12/25/20   Strader, Fransisco Hertz, PA-C  Potassium Chloride ER 20 MEQ TBCR TAKE 1 TABLET EVERY DAY 07/27/21   Strader, [place] M, PA-C  risperiDONE (RISPERDAL) 2 MG tablet Take 1 tablet (2 mg total) by mouth 2 (two) times daily. 03/11/21 03/11/22  Cloria Spring, MD  rosuvastatin (CRESTOR) 40 MG tablet Take 1 tablet (40 mg total) by mouth daily. 11/20/20   Brita Romp, NP  traZODone (DESYREL) 100 MG tablet Take  2 tablets (200 mg total) by mouth at bedtime. 03/11/21   Cloria Spring, MD  Vitamin D, Ergocalciferol, (DRISDOL) 1.25 MG (50000 UNIT) CAPS capsule Take 1 capsule (50,000 Units total) by mouth every 7 (seven) days. 11/20/20   Brita Romp, NP    Allergies    Amoxicillin, Doxycycline, Levofloxacin, and Propoxyphene n-acetaminophen  Review of Systems   Review of Systems  Constitutional:  Positive for fatigue. Negative for activity change and fever.  HENT:  Negative for facial swelling and trouble swallowing.   Eyes:  Negative for discharge and redness.  Respiratory:  Negative for cough and shortness of breath.   Cardiovascular:  Negative for chest pain and palpitations.  Gastrointestinal:  Negative for abdominal pain and nausea.  Genitourinary:  Negative for dysuria and flank pain.  Musculoskeletal:  Negative for back pain and gait problem.  Skin:  Negative for pallor and rash.  Neurological:  Positive for speech difficulty. Negative for syncope and headaches.       Confusion   Physical Exam Updated Vital Signs BP 131/79    Pulse (!) 59    Temp 98.2 F (36.8 C) (Oral)    Resp 12    Ht 5' 4"  (1.626 m)    Wt 127 kg    SpO2 97%    BMI 48.06 kg/m   Physical Exam Vitals and nursing note reviewed.  Constitutional:      General: She is not in acute distress.    Appearance: Normal appearance. She is obese. She is not ill-appearing.  HENT:     Head: Normocephalic and atraumatic. No raccoon eyes, Battle's sign, right periorbital erythema or left periorbital erythema.     Right  Ear: External ear normal.     Left Ear: External ear normal.     Nose: Nose normal.     Mouth/Throat:     Mouth: Mucous membranes are moist.  Eyes:     General: No scleral icterus.       Right eye: No discharge.        Left eye: No discharge.     Extraocular Movements: Extraocular movements intact.     Pupils: Pupils are equal, round, and reactive to light.  Cardiovascular:     Rate and Rhythm: Normal rate and regular rhythm.     Pulses: Normal pulses.     Heart sounds: Normal heart sounds.  Pulmonary:     Effort: Pulmonary effort is normal. No respiratory distress.     Breath sounds: Normal breath sounds.  Abdominal:     General: Abdomen is flat.     Tenderness: There is no abdominal tenderness.  Musculoskeletal:        General: Normal range of motion.     Cervical back: Normal range of motion.     Right lower leg: No edema.     Left lower leg: No edema.  Skin:    General: Skin is warm and dry.     Capillary Refill: Capillary refill takes less than 2 seconds.  Neurological:     Mental Status: She is alert and oriented to person, place, and time. She is confused.     GCS: GCS eye subscore is 4. GCS verbal subscore is 5. GCS motor subscore is 6.     Cranial Nerves: No dysarthria or facial asymmetry.     Sensory: Sensation is intact.     Motor: Motor function is intact. No tremor.     Coordination: Coordination is intact. Finger-Nose-Finger Test normal.  Gait: Gait is intact. Gait normal.     Comments: Mild expressive aphasia, daughter feels pt is confused, patient having trouble remember events over the past 12-24 hours.  Psychiatric:        Mood and Affect: Mood normal.        Behavior: Behavior normal.    ED Results / Procedures / Treatments   Labs (all labs ordered are listed, but only abnormal results are displayed) Labs Reviewed  COMPREHENSIVE METABOLIC PANEL - Abnormal; Notable for the following components:      Result Value   Glucose, Bld 314 (*)    BUN 28  (*)    Creatinine, Ser 1.21 (*)    GFR, Estimated 51 (*)    All other components within normal limits  CBC - Abnormal; Notable for the following components:   RBC 5.23 (*)    All other components within normal limits  AMMONIA - Abnormal; Notable for the following components:   Ammonia 36 (*)    All other components within normal limits  SALICYLATE LEVEL - Abnormal; Notable for the following components:   Salicylate Lvl <7.2 (*)    All other components within normal limits  ACETAMINOPHEN LEVEL - Abnormal; Notable for the following components:   Acetaminophen (Tylenol), Serum <10 (*)    All other components within normal limits  OSMOLALITY - Abnormal; Notable for the following components:   Osmolality 308 (*)    All other components within normal limits  I-STAT VENOUS BLOOD GAS, ED - Abnormal; Notable for the following components:   pO2, Ven 48.0 (*)    Bicarbonate 32.0 (*)    TCO2 34 (*)    Acid-Base Excess 6.0 (*)    Sodium 133 (*)    Potassium 7.3 (*)    Calcium, Ion 1.09 (*)    All other components within normal limits  I-STAT CHEM 8, ED - Abnormal; Notable for the following components:   BUN 36 (*)    Creatinine, Ser 1.10 (*)    Glucose, Bld 232 (*)    Calcium, Ion 1.13 (*)    TCO2 33 (*)    All other components within normal limits  I-STAT VENOUS BLOOD GAS, ED - Abnormal; Notable for the following components:   pH, Ven 7.434 (*)    pO2, Ven 185.0 (*)    Bicarbonate 33.7 (*)    TCO2 35 (*)    Acid-Base Excess 8.0 (*)    Calcium, Ion 1.13 (*)    All other components within normal limits  RESP PANEL BY RT-PCR (FLU A&B, COVID) ARPGX2  LIPASE, BLOOD  BRAIN NATRIURETIC PEPTIDE  TSH  T4, FREE  PROTIME-INR  BLOOD GAS, VENOUS  TROPONIN I (HIGH SENSITIVITY)  TROPONIN I (HIGH SENSITIVITY)    EKG EKG Interpretation  Date/Time:  Tuesday September 15 2021 16:39:57 EST Ventricular Rate:  63 PR Interval:  189 QRS Duration: 93 QT Interval:  405 QTC Calculation: 415 R  Axis:   79 Text Interpretation: Sinus rhythm Borderline repolarization abnormality Confirmed by Wynona Dove ([id number]) on 09/15/2021 7:09:08 PM  Radiology CT Head Wo Contrast  Result Date: 09/15/2021 CLINICAL DATA:  Confusion, last seen normal last night, abnormal mental status EXAM: CT HEAD WITHOUT CONTRAST TECHNIQUE: Contiguous axial images were obtained from the base of the skull through the vertex without intravenous contrast. COMPARISON:  07/13/2017 FINDINGS: Brain: Normal ventricular morphology. No midline shift or mass effect. Age-indeterminate LEFT thalamic infarct new since 2018, favor at least subacute. No intracranial hemorrhage, mass lesion, or  fluid collections. Additional infarction. No extra-axial Vascular: No hyperdense vessels. Atherosclerotic calcifications of internal carotid arteries at skull base Skull: Intact Sinuses/Orbits: Clear Other: N/A IMPRESSION: Age-indeterminate LEFT thalamic infarct new since 2018, favor at least subacute in age. No other intracranial abnormalities. Electronically Signed   By: Lavonia Dana M.D.   On: 09/15/2021 17:27   DG Chest Portable 1 View  Result Date: 09/15/2021 CLINICAL DATA:  Altered mental status, confusion EXAM: PORTABLE CHEST 1 VIEW COMPARISON:  Portable exam 1713 hours compared to 07/24/2018 FINDINGS: Borderline enlargement of cardiac silhouette post median sternotomy. Mediastinal contours and pulmonary vascularity normal. Lungs clear. No infiltrate, pleural effusion or pneumothorax. IMPRESSION: No acute abnormalities. Electronically Signed   By: Lavonia Dana M.D.   On: 09/15/2021 17:28    Procedures .Critical Care Performed by: Jeanell Sparrow, DO Authorized by: Jeanell Sparrow, DO   Critical care provider statement:    Critical care time (minutes):  30   Critical care time was exclusive of:  Separately billable procedures and treating other patients   Critical care was necessary to treat or prevent imminent or life-threatening deterioration  of the following conditions:  CNS failure or compromise   Critical care was time spent personally by me on the following activities:  Development of treatment plan with patient or surrogate, discussions with consultants, evaluation of patient's response to treatment, examination of patient, ordering and review of laboratory studies, ordering and review of radiographic studies, ordering and performing treatments and interventions, pulse oximetry, re-evaluation of patient's condition and review of old charts   Care discussed with: admitting provider   Comments:     cva   Medications Ordered in ED Medications  sodium chloride 0.9 % bolus 1,000 mL (1,000 mLs Intravenous New Bag/Given 09/15/21 2304)  insulin aspart (novoLOG) injection 5 Units (5 Units Subcutaneous Given 09/15/21 2311)    ED Course  I have reviewed the triage vital signs and the nursing notes.  Pertinent labs & imaging results that were available during my care of the patient were reviewed by me and considered in my medical decision making (see chart for details).    MDM Rules/Calculators/A&P                          CC: ams  This patient complains of above; this involves an extensive number of treatment options and is a complaint that carries with it a high risk of complications and morbidity. Vital signs were reviewed. Serious etiologies considered.  Patient presenting greater than 24 hours from symptom onset.  She is not thrombolytics candidate.  Not candidate for our intervention of possible LVO.  Patient does have some confusion, mild expressive aphasia.  Will obtain CT head.  Record review:  Previous records obtained and reviewed   Additional history obtained from daughter  Work up as above, notable for:  Labs & imaging results that were available during my care of the patient were reviewed by me and considered in my medical decision making.  ISTAT with hyperkalemia, her metabolic panel with normal potassium; favor  likely lab error on ISTAT  I ordered imaging studies which included CTH and I independently visualized and interpreted imaging which showed age-indeterminate left thalamic infarct  Pt does have aphasia, word finding difficulty, some ongoing confusion. D/w neuro on call Dr Lorrin Goodell, recommends transfer to [facility] for MRI. Requests call once MRI is resulted for eval. Unclear timing of the stroke at this time and if this shown  to be a a chronic stroke on MRI would likely be able to be discharged and o/p f/u with neuro.  D/w Dr Gilford Raid at [facility] regarding transfer ED to ED for MRI who accepts the patient.   Patient will be sent via EMS to [facility] ED, she is stable and agreeable to transfer.        This chart was dictated using voice recognition software.  Despite best efforts to proofread,  errors can occur which can change the documentation meaning.    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

## 2021-09-15 NOTE — ED Triage Notes (Signed)
Pt transferred via [facility] from [facility] for Left thalamic stroke. LKW 1600 12/19. Confusion noted yesterday, brought into PCP office. PCP suspected UTI, but UA clear. No physical deficits at this time, but shows confusion.   VSS with [facility].

## 2021-09-15 NOTE — Discharge Instructions (Addendum)
Note from Pharmacy:  Plavix and aspirin therapy are to continue together through October 06, 2021.   Starting on October 07, 2021 take Plavix ONLY per neurologist  Thank you

## 2021-09-15 NOTE — ED Provider Notes (Signed)
Patient was initially seen and evaluated by Dr. Wallace Cullens at [facility] for complaints of confusion and difficulty speaking from a baseline.  Last known normal was 4 PM yesterday.  Patient had a head CT scan that shows concerns for a subacute in our left thalamic infarct.  She is sent here for MRI of the brain for further assessment.    Patient also has a history of diabetes and did not take her insulin today, CBG is 314, will give IV fluid and 5 units of insulin.  4:20 AM Brain MRI demonstrated a subacute 14 mm left thalamic lacunar infarct with petechial hemorrhage, but no malignant hemorrhagic transformation or significant mass-effect.  4:26 AM Appreciate consultation from on call neurology Dr. Patrecia Pace who will see pt and recommend admission for stroke work up.    I have called and update pt's daughter [name] [phone]) , who wish to be kept in the loop with her care.    4:55 AM Appreciate consultation from [facility] Dr. Leafy Half who agrees to see and will admit pt for further care.   .Critical Care Performed by: Fayrene Helper, PA-C Authorized by: Fayrene Helper, PA-C   Critical care provider statement:    Critical care time (minutes):  30   Critical care was time spent personally by me on the following activities:  Development of treatment plan with patient or surrogate, discussions with consultants, evaluation of patient's response to treatment, examination of patient, ordering and review of laboratory studies, ordering and review of radiographic studies, ordering and performing treatments and interventions, pulse oximetry, re-evaluation of patient's condition and review of old charts   BP (!) 142/79    Pulse 66    Temp 98.2 F (36.8 C) (Oral)    Resp 17    Ht 5\' 4"  (1.626 m)    Wt 127 kg    SpO2 98%    BMI 48.06 kg/m   Results for orders placed or performed during the hospital encounter of 09/15/21  Resp Panel by RT-PCR (Flu A&B, Covid) Nasopharyngeal Swab   Specimen:  Nasopharyngeal Swab; Nasopharyngeal(NP) swabs in vial transport medium  Result Value Ref Range   SARS Coronavirus 2 by RT PCR NEGATIVE NEGATIVE   Influenza A by PCR NEGATIVE NEGATIVE   Influenza B by PCR NEGATIVE NEGATIVE  Comprehensive metabolic panel  Result Value Ref Range   Sodium 135 135 - 145 mmol/L   Potassium 4.7 3.5 - 5.1 mmol/L   Chloride 98 98 - 111 mmol/L   CO2 28 22 - 32 mmol/L   Glucose, Bld 314 (H) 70 - 99 mg/dL   BUN 28 (H) 8 - 23 mg/dL   Creatinine, Ser 09/17/21 (H) 0.44 - 1.00 mg/dL   Calcium 9.2 8.9 - 0.93 mg/dL   Total Protein 7.6 6.5 - 8.1 g/dL   Albumin 3.9 3.5 - 5.0 g/dL   AST 16 15 - 41 U/L   ALT 18 0 - 44 U/L   Alkaline Phosphatase 82 38 - 126 U/L   Total Bilirubin 0.5 0.3 - 1.2 mg/dL   GFR, Estimated 51 (L) >60 mL/min   Anion gap 9 5 - 15  CBC  Result Value Ref Range   WBC 6.8 4.0 - 10.5 K/uL   RBC 5.23 (H) 3.87 - 5.11 MIL/uL   Hemoglobin 14.8 12.0 - 15.0 g/dL   HCT 81.8 29.9 - 37.1 %   MCV 82.2 80.0 - 100.0 fL   MCH 28.3 26.0 - 34.0 pg   MCHC 34.4 30.0 -  36.0 g/dL   RDW 75.6 43.3 - 29.5 %   Platelets 265 150 - 400 K/uL   nRBC 0.0 0.0 - 0.2 %  Lipase, blood  Result Value Ref Range   Lipase 40 11 - 51 U/L  Ammonia  Result Value Ref Range   Ammonia 36 (H) 9 - 35 umol/L  Salicylate level  Result Value Ref Range   Salicylate Lvl <7.0 (L) 7.0 - 30.0 mg/dL  Acetaminophen level  Result Value Ref Range   Acetaminophen (Tylenol), Serum <10 (L) 10 - 30 ug/mL  Brain natriuretic peptide  Result Value Ref Range   B Natriuretic Peptide 82.9 0.0 - 100.0 pg/mL  TSH  Result Value Ref Range   TSH 1.089 0.350 - 4.500 uIU/mL  T4, free  Result Value Ref Range   Free T4 0.97 0.61 - 1.12 ng/dL  Protime-INR  Result Value Ref Range   Prothrombin Time 12.6 11.4 - 15.2 seconds   INR 0.9 0.8 - 1.2  Osmolality  Result Value Ref Range   Osmolality 308 (H) 275 - 295 mOsm/kg  I-Stat venous blood gas, ED  Result Value Ref Range   pH, Ven 7.420 7.250 - 7.430    pCO2, Ven 49.2 44.0 - 60.0 mmHg   pO2, Ven 48.0 (H) 32.0 - 45.0 mmHg   Bicarbonate 32.0 (H) 20.0 - 28.0 mmol/L   TCO2 34 (H) 22 - 32 mmol/L   O2 Saturation 84.0 %   Acid-Base Excess 6.0 (H) 0.0 - 2.0 mmol/L   Sodium 133 (L) 135 - 145 mmol/L   Potassium 7.3 (HH) 3.5 - 5.1 mmol/L   Calcium, Ion 1.09 (L) 1.15 - 1.40 mmol/L   HCT 42.0 36.0 - 46.0 %   Hemoglobin 14.3 12.0 - 15.0 g/dL   Patient temperature 18.8 F    Collection site IV start    Drawn by Nurse    Sample type VENOUS    Comment NOTIFIED PHYSICIAN   I-stat chem 8, ED (not at [facility] or [facility])  Result Value Ref Range   Sodium 137 135 - 145 mmol/L   Potassium 5.1 3.5 - 5.1 mmol/L   Chloride 99 98 - 111 mmol/L   BUN 36 (H) 8 - 23 mg/dL   Creatinine, Ser 4.16 (H) 0.44 - 1.00 mg/dL   Glucose, Bld 606 (H) 70 - 99 mg/dL   Calcium, Ion 3.01 (L) 1.15 - 1.40 mmol/L   TCO2 33 (H) 22 - 32 mmol/L   Hemoglobin 13.6 12.0 - 15.0 g/dL   HCT 60.1 09.3 - 23.5 %  I-Stat venous blood gas, ED  Result Value Ref Range   pH, Ven 7.434 (H) 7.250 - 7.430   pCO2, Ven 50.3 44.0 - 60.0 mmHg   pO2, Ven 185.0 (H) 32.0 - 45.0 mmHg   Bicarbonate 33.7 (H) 20.0 - 28.0 mmol/L   TCO2 35 (H) 22 - 32 mmol/L   O2 Saturation 100.0 %   Acid-Base Excess 8.0 (H) 0.0 - 2.0 mmol/L   Sodium 136 135 - 145 mmol/L   Potassium 5.1 3.5 - 5.1 mmol/L   Calcium, Ion 1.13 (L) 1.15 - 1.40 mmol/L   HCT 39.0 36.0 - 46.0 %   Hemoglobin 13.3 12.0 - 15.0 g/dL   Sample type VENOUS   CBG monitoring, ED  Result Value Ref Range   Glucose-Capillary 183 (H) 70 - 99 mg/dL  Troponin I (High Sensitivity)  Result Value Ref Range   Troponin I (High Sensitivity) 6 <18 ng/L  Troponin I (High Sensitivity)  Result Value Ref Range   Troponin I (High Sensitivity) 7 <18 ng/L   CT Head Wo Contrast  Result Date: 09/15/2021 CLINICAL DATA:  Confusion, last seen normal last night, abnormal mental status EXAM: CT HEAD WITHOUT CONTRAST TECHNIQUE: Contiguous axial images were obtained from the  base of the skull through the vertex without intravenous contrast. COMPARISON:  07/13/2017 FINDINGS: Brain: Normal ventricular morphology. No midline shift or mass effect. Age-indeterminate LEFT thalamic infarct new since 2018, favor at least subacute. No intracranial hemorrhage, mass lesion, or fluid collections. Additional infarction. No extra-axial Vascular: No hyperdense vessels. Atherosclerotic calcifications of internal carotid arteries at skull base Skull: Intact Sinuses/Orbits: Clear Other: N/A IMPRESSION: Age-indeterminate LEFT thalamic infarct new since 2018, favor at least subacute in age. No other intracranial abnormalities. Electronically Signed   By: Ulyses Southward M.D.   On: 09/15/2021 17:27   MR BRAIN WO CONTRAST  Result Date: 09/16/2021 CLINICAL DATA:  60 year old female with confusion. Abnormal mental status. Age indeterminate left thalamic infarct on head CT yesterday. EXAM: MRI HEAD WITHOUT CONTRAST TECHNIQUE: Multiplanar, multiecho pulse sequences of the brain and surrounding structures were obtained without intravenous contrast. COMPARISON:  Head CT 09/15/2021, 07/13/2017. FINDINGS: Brain: Oval 14 mm area of intense abnormal trace diffusion signal in the left thalamus corresponding to the CT finding. This appears heterogeneously restricted on ADC. Confluent associated T2 and FLAIR hyperintensity with decreased T1 signal. SWI indicates associated microhemorrhage, but there is no malignant hemorrhagic transformation or significant mass effect. No other restricted diffusion. No midline shift, mass effect, evidence of mass lesion, ventriculomegaly, extra-axial collection or acute intracranial hemorrhage. Cervicomedullary junction and pituitary are within normal limits. Outside of the left thalamus Wallace Cullens and white matter signal is within normal limits for age throughout the brain. No cortical encephalomalacia. No other cerebral blood products identified on SWI. Basal ganglia, brainstem and  cerebellum are within normal limits. Vascular: Major intracranial vascular flow voids are preserved. Skull and upper cervical spine: Negative visible cervical spine. Visualized bone marrow signal is within normal limits. Sinuses/Orbits: Negative orbits. Mild bilateral paranasal sinus mucosal thickening. Other: Mastoids are well aerated. Grossly normal visible internal auditory structures. Negative visible scalp and face. IMPRESSION: 1. Subacute 14 mm left thalamic lacunar infarct with petechial hemorrhage, but no malignant hemorrhagic transformation or significant mass effect. 2. No other acute intracranial abnormality, elsewhere normal for age noncontrast MRI appearance of the brain. Electronically Signed   By: Odessa Fleming M.D.   On: 09/16/2021 04:13   DG Chest Portable 1 View  Result Date: 09/15/2021 CLINICAL DATA:  Altered mental status, confusion EXAM: PORTABLE CHEST 1 VIEW COMPARISON:  Portable exam 1713 hours compared to 07/24/2018 FINDINGS: Borderline enlargement of cardiac silhouette post median sternotomy. Mediastinal contours and pulmonary vascularity normal. Lungs clear. No infiltrate, pleural effusion or pneumothorax. IMPRESSION: No acute abnormalities. Electronically Signed   By: Ulyses Southward M.D.   On: 09/15/2021 17:28      Fayrene Helper, PA-C 09/16/21 0458    Melene Plan, DO 09/16/21 [phone]

## 2021-09-15 NOTE — ED Triage Notes (Signed)
Sent here from PMD office for r/o CVA , daughter reports spoke with mother @ 12pm and she was confused , took her to PMD office and was sent here, last seen normal was last pm

## 2021-09-16 ENCOUNTER — Inpatient Hospital Stay (HOSPITAL_COMMUNITY): Payer: Medicare Other

## 2021-09-16 ENCOUNTER — Emergency Department (HOSPITAL_COMMUNITY): Payer: Medicare Other

## 2021-09-16 DIAGNOSIS — E039 Hypothyroidism, unspecified: Secondary | ICD-10-CM | POA: Diagnosis not present

## 2021-09-16 DIAGNOSIS — R4701 Aphasia: Secondary | ICD-10-CM | POA: Diagnosis present

## 2021-09-16 DIAGNOSIS — E782 Mixed hyperlipidemia: Secondary | ICD-10-CM

## 2021-09-16 DIAGNOSIS — F319 Bipolar disorder, unspecified: Secondary | ICD-10-CM | POA: Diagnosis not present

## 2021-09-16 DIAGNOSIS — E1165 Type 2 diabetes mellitus with hyperglycemia: Secondary | ICD-10-CM

## 2021-09-16 DIAGNOSIS — R29701 NIHSS score 1: Secondary | ICD-10-CM | POA: Diagnosis not present

## 2021-09-16 DIAGNOSIS — N1831 Chronic kidney disease, stage 3a: Secondary | ICD-10-CM | POA: Diagnosis not present

## 2021-09-16 DIAGNOSIS — I2510 Atherosclerotic heart disease of native coronary artery without angina pectoris: Secondary | ICD-10-CM

## 2021-09-16 DIAGNOSIS — E1169 Type 2 diabetes mellitus with other specified complication: Secondary | ICD-10-CM | POA: Diagnosis not present

## 2021-09-16 DIAGNOSIS — E11649 Type 2 diabetes mellitus with hypoglycemia without coma: Secondary | ICD-10-CM | POA: Diagnosis not present

## 2021-09-16 DIAGNOSIS — R4182 Altered mental status, unspecified: Secondary | ICD-10-CM | POA: Diagnosis not present

## 2021-09-16 DIAGNOSIS — I5032 Chronic diastolic (congestive) heart failure: Secondary | ICD-10-CM | POA: Diagnosis not present

## 2021-09-16 DIAGNOSIS — Z8249 Family history of ischemic heart disease and other diseases of the circulatory system: Secondary | ICD-10-CM | POA: Diagnosis not present

## 2021-09-16 DIAGNOSIS — Z20822 Contact with and (suspected) exposure to covid-19: Secondary | ICD-10-CM | POA: Diagnosis not present

## 2021-09-16 DIAGNOSIS — I25810 Atherosclerosis of coronary artery bypass graft(s) without angina pectoris: Secondary | ICD-10-CM | POA: Diagnosis present

## 2021-09-16 DIAGNOSIS — Z888 Allergy status to other drugs, medicaments and biological substances status: Secondary | ICD-10-CM | POA: Diagnosis not present

## 2021-09-16 DIAGNOSIS — Z881 Allergy status to other antibiotic agents status: Secondary | ICD-10-CM | POA: Diagnosis not present

## 2021-09-16 DIAGNOSIS — I10 Essential (primary) hypertension: Secondary | ICD-10-CM

## 2021-09-16 DIAGNOSIS — Z880 Allergy status to penicillin: Secondary | ICD-10-CM | POA: Diagnosis not present

## 2021-09-16 DIAGNOSIS — I6381 Other cerebral infarction due to occlusion or stenosis of small artery: Secondary | ICD-10-CM | POA: Diagnosis not present

## 2021-09-16 DIAGNOSIS — Z818 Family history of other mental and behavioral disorders: Secondary | ICD-10-CM | POA: Diagnosis not present

## 2021-09-16 DIAGNOSIS — E1122 Type 2 diabetes mellitus with diabetic chronic kidney disease: Secondary | ICD-10-CM | POA: Diagnosis not present

## 2021-09-16 DIAGNOSIS — I130 Hypertensive heart and chronic kidney disease with heart failure and stage 1 through stage 4 chronic kidney disease, or unspecified chronic kidney disease: Secondary | ICD-10-CM | POA: Diagnosis not present

## 2021-09-16 DIAGNOSIS — E7800 Pure hypercholesterolemia, unspecified: Secondary | ICD-10-CM | POA: Diagnosis not present

## 2021-09-16 DIAGNOSIS — Z6841 Body Mass Index (BMI) 40.0 and over, adult: Secondary | ICD-10-CM | POA: Diagnosis not present

## 2021-09-16 DIAGNOSIS — Z951 Presence of aortocoronary bypass graft: Secondary | ICD-10-CM | POA: Diagnosis not present

## 2021-09-16 DIAGNOSIS — Z794 Long term (current) use of insulin: Secondary | ICD-10-CM

## 2021-09-16 DIAGNOSIS — M069 Rheumatoid arthritis, unspecified: Secondary | ICD-10-CM | POA: Diagnosis not present

## 2021-09-16 DIAGNOSIS — J45909 Unspecified asthma, uncomplicated: Secondary | ICD-10-CM | POA: Diagnosis not present

## 2021-09-16 LAB — HIV ANTIBODY (ROUTINE TESTING W REFLEX): HIV Screen 4th Generation wRfx: NONREACTIVE

## 2021-09-16 LAB — CBC WITH DIFFERENTIAL/PLATELET
Abs Immature Granulocytes: 0.02 10*3/uL (ref 0.00–0.07)
Basophils Absolute: 0 10*3/uL (ref 0.0–0.1)
Basophils Relative: 1 %
Eosinophils Absolute: 0.2 10*3/uL (ref 0.0–0.5)
Eosinophils Relative: 3 %
HCT: 39.5 % (ref 36.0–46.0)
Hemoglobin: 13.3 g/dL (ref 12.0–15.0)
Immature Granulocytes: 0 %
Lymphocytes Relative: 29 %
Lymphs Abs: 1.6 10*3/uL (ref 0.7–4.0)
MCH: 28.4 pg (ref 26.0–34.0)
MCHC: 33.7 g/dL (ref 30.0–36.0)
MCV: 84.2 fL (ref 80.0–100.0)
Monocytes Absolute: 0.4 10*3/uL (ref 0.1–1.0)
Monocytes Relative: 7 %
Neutro Abs: 3.3 10*3/uL (ref 1.7–7.7)
Neutrophils Relative %: 60 %
Platelets: 236 10*3/uL (ref 150–400)
RBC: 4.69 MIL/uL (ref 3.87–5.11)
RDW: 12.6 % (ref 11.5–15.5)
WBC: 5.5 10*3/uL (ref 4.0–10.5)
nRBC: 0 % (ref 0.0–0.2)

## 2021-09-16 LAB — GLUCOSE, CAPILLARY: Glucose-Capillary: 83 mg/dL (ref 70–99)

## 2021-09-16 LAB — COMPREHENSIVE METABOLIC PANEL
ALT: 15 U/L (ref 0–44)
AST: 16 U/L (ref 15–41)
Albumin: 3.2 g/dL — ABNORMAL LOW (ref 3.5–5.0)
Alkaline Phosphatase: 69 U/L (ref 38–126)
Anion gap: 9 (ref 5–15)
BUN: 23 mg/dL (ref 8–23)
CO2: 24 mmol/L (ref 22–32)
Calcium: 8.7 mg/dL — ABNORMAL LOW (ref 8.9–10.3)
Chloride: 103 mmol/L (ref 98–111)
Creatinine, Ser: 1.22 mg/dL — ABNORMAL HIGH (ref 0.44–1.00)
GFR, Estimated: 50 mL/min — ABNORMAL LOW (ref >60)
Glucose, Bld: 216 mg/dL — ABNORMAL HIGH (ref 70–99)
Potassium: 4.1 mmol/L (ref 3.5–5.1)
Sodium: 136 mmol/L (ref 135–145)
Total Bilirubin: 1 mg/dL (ref 0.3–1.2)
Total Protein: 6 g/dL — ABNORMAL LOW (ref 6.5–8.1)

## 2021-09-16 LAB — ECHOCARDIOGRAM COMPLETE BUBBLE STUDY
Area-P 1/2: 3.48 cm2
S' Lateral: 3.1 cm

## 2021-09-16 LAB — HEMOGLOBIN A1C
Hgb A1c MFr Bld: 9 % — ABNORMAL HIGH (ref 4.8–5.6)
Hgb A1c MFr Bld: 8.9 % — ABNORMAL HIGH (ref 4.8–5.6)
Mean Plasma Glucose: 211.6 mg/dL
Mean Plasma Glucose: 208.73 mg/dL

## 2021-09-16 LAB — CBG MONITORING, ED
Glucose-Capillary: 183 mg/dL — ABNORMAL HIGH (ref 70–99)
Glucose-Capillary: 218 mg/dL — ABNORMAL HIGH (ref 70–99)
Glucose-Capillary: 165 mg/dL — ABNORMAL HIGH (ref 70–99)
Glucose-Capillary: 114 mg/dL — ABNORMAL HIGH (ref 70–99)

## 2021-09-16 LAB — LIPID PANEL
Cholesterol: 299 mg/dL — ABNORMAL HIGH (ref 0–200)
HDL: 36 mg/dL — ABNORMAL LOW (ref >40)
LDL Cholesterol: 226 mg/dL — ABNORMAL HIGH (ref 0–99)
Total CHOL/HDL Ratio: 8.3 RATIO
Triglycerides: 183 mg/dL — ABNORMAL HIGH (ref <150)
VLDL: 37 mg/dL (ref 0–40)

## 2021-09-16 LAB — MAGNESIUM: Magnesium: 1.7 mg/dL (ref 1.7–2.4)

## 2021-09-16 MED ORDER — ATORVASTATIN CALCIUM 40 MG PO TABS
80.0000 mg | ORAL_TABLET | Freq: Every day | ORAL | Status: DC
  Administered 2021-09-16: 09:00:00 80 mg via ORAL
  Filled 2021-09-16: qty 2

## 2021-09-16 MED ORDER — ROSUVASTATIN CALCIUM 20 MG PO TABS
40.0000 mg | ORAL_TABLET | Freq: Every day | ORAL | Status: DC
  Administered 2021-09-16 – 2021-09-23 (×8): 40 mg via ORAL
  Filled 2021-09-16 (×8): qty 2

## 2021-09-16 MED ORDER — NITROGLYCERIN 0.4 MG SL SUBL: 0.4000 mg | SUBLINGUAL_TABLET | SUBLINGUAL | Status: DC | PRN

## 2021-09-16 MED ORDER — LEVOTHYROXINE SODIUM 50 MCG PO TABS
50.0000 ug | ORAL_TABLET | Freq: Every day | ORAL | Status: DC
  Administered 2021-09-16 – 2021-09-23 (×8): 50 ug via ORAL
  Filled 2021-09-16 (×4): qty 1
  Filled 2021-09-16: qty 2
  Filled 2021-09-16 (×3): qty 1

## 2021-09-16 MED ORDER — IPRATROPIUM-ALBUTEROL 0.5-2.5 (3) MG/3ML IN SOLN: 3.0000 mL | RESPIRATORY_TRACT | Status: DC | PRN

## 2021-09-16 MED ORDER — METOPROLOL TARTRATE 5 MG/5ML IV SOLN: 5.0000 mg | INTRAVENOUS | Status: DC | PRN

## 2021-09-16 MED ORDER — EZETIMIBE 10 MG PO TABS
10.0000 mg | ORAL_TABLET | Freq: Every day | ORAL | Status: DC
  Administered 2021-09-16 – 2021-09-23 (×8): 10 mg via ORAL
  Filled 2021-09-16 (×8): qty 1

## 2021-09-16 MED ORDER — POLYETHYLENE GLYCOL 3350 17 G PO PACK
17.0000 g | PACK | Freq: Every day | ORAL | Status: DC | PRN
  Administered 2021-09-20 – 2021-09-22 (×3): 17 g via ORAL
  Filled 2021-09-16 (×4): qty 1

## 2021-09-16 MED ORDER — ASPIRIN EC 325 MG PO TBEC: 325.0000 mg | DELAYED_RELEASE_TABLET | Freq: Every day | ORAL | Status: DC

## 2021-09-16 MED ORDER — CLOPIDOGREL BISULFATE 75 MG PO TABS
75.0000 mg | ORAL_TABLET | Freq: Every day | ORAL | Status: DC
  Administered 2021-09-16 – 2021-09-23 (×8): 75 mg via ORAL
  Filled 2021-09-16 (×8): qty 1

## 2021-09-16 MED ORDER — DULOXETINE HCL 60 MG PO CPEP
60.0000 mg | ORAL_CAPSULE | Freq: Two times a day (BID) | ORAL | Status: DC
  Administered 2021-09-16 – 2021-09-23 (×15): 60 mg via ORAL
  Filled 2021-09-16 (×17): qty 1

## 2021-09-16 MED ORDER — BUSPIRONE HCL 10 MG PO TABS
30.0000 mg | ORAL_TABLET | Freq: Three times a day (TID) | ORAL | Status: DC
  Administered 2021-09-16 – 2021-09-23 (×22): 30 mg via ORAL
  Filled 2021-09-16 (×22): qty 3

## 2021-09-16 MED ORDER — SENNOSIDES-DOCUSATE SODIUM 8.6-50 MG PO TABS
1.0000 | ORAL_TABLET | Freq: Every evening | ORAL | Status: DC | PRN
  Administered 2021-09-20 – 2021-09-21 (×2): 1 via ORAL
  Filled 2021-09-16 (×2): qty 1

## 2021-09-16 MED ORDER — ALBUTEROL SULFATE (2.5 MG/3ML) 0.083% IN NEBU: 3.0000 mL | INHALATION_SOLUTION | Freq: Four times a day (QID) | RESPIRATORY_TRACT | Status: DC | PRN

## 2021-09-16 MED ORDER — INSULIN ASPART 100 UNIT/ML IJ SOLN
0.0000 [IU] | Freq: Three times a day (TID) | INTRAMUSCULAR | Status: DC
Start: 2021-09-16 — End: 2021-09-23
  Administered 2021-09-16: 12:00:00 3 [IU] via SUBCUTANEOUS
  Administered 2021-09-16 – 2021-09-17 (×2): 5 [IU] via SUBCUTANEOUS
  Administered 2021-09-17: 12:00:00 2 [IU] via SUBCUTANEOUS
  Administered 2021-09-18: 13:00:00 3 [IU] via SUBCUTANEOUS
  Administered 2021-09-18: 18:00:00 5 [IU] via SUBCUTANEOUS
  Administered 2021-09-18: 22:00:00 3 [IU] via SUBCUTANEOUS
  Administered 2021-09-19: 13:00:00 5 [IU] via SUBCUTANEOUS
  Administered 2021-09-19: 22:00:00 3 [IU] via SUBCUTANEOUS
  Administered 2021-09-19: 17:00:00 5 [IU] via SUBCUTANEOUS
  Administered 2021-09-20: 14:00:00 3 [IU] via SUBCUTANEOUS
  Administered 2021-09-20 – 2021-09-21 (×4): 5 [IU] via SUBCUTANEOUS
  Administered 2021-09-21: 22:00:00 2 [IU] via SUBCUTANEOUS
  Administered 2021-09-21 – 2021-09-22 (×2): 5 [IU] via SUBCUTANEOUS
  Administered 2021-09-22: 18:00:00 8 [IU] via SUBCUTANEOUS
  Administered 2021-09-22: 13:00:00 5 [IU] via SUBCUTANEOUS
  Administered 2021-09-22: 07:00:00 2 [IU] via SUBCUTANEOUS
  Administered 2021-09-23: 07:00:00 5 [IU] via SUBCUTANEOUS
  Administered 2021-09-23: 12:00:00 3 [IU] via SUBCUTANEOUS

## 2021-09-16 MED ORDER — HYDROXYZINE HCL 25 MG PO TABS
25.0000 mg | ORAL_TABLET | Freq: Three times a day (TID) | ORAL | Status: DC | PRN
  Administered 2021-09-16: 08:00:00 25 mg via ORAL
  Filled 2021-09-16: qty 1

## 2021-09-16 MED ORDER — ROSUVASTATIN CALCIUM 20 MG PO TABS
40.0000 mg | ORAL_TABLET | Freq: Every day | ORAL | Status: DC
  Administered 2021-09-16: 08:00:00 40 mg via ORAL
  Filled 2021-09-16: qty 2

## 2021-09-16 MED ORDER — INSULIN ASPART PROT & ASPART (70-30 MIX) 100 UNIT/ML ~~LOC~~ SUSP
90.0000 [IU] | Freq: Two times a day (BID) | SUBCUTANEOUS | Status: DC
  Administered 2021-09-16 – 2021-09-17 (×4): 90 [IU] via SUBCUTANEOUS
  Filled 2021-09-16: qty 10

## 2021-09-16 MED ORDER — IOHEXOL 350 MG/ML SOLN
50.0000 mL | Freq: Once | INTRAVENOUS | Status: AC | PRN
  Administered 2021-09-16: 07:00:00 50 mL via INTRAVENOUS

## 2021-09-16 MED ORDER — STROKE: EARLY STAGES OF RECOVERY BOOK
Freq: Once | Status: AC
  Filled 2021-09-16: qty 1

## 2021-09-16 MED ORDER — HYDRALAZINE HCL 20 MG/ML IJ SOLN: 10.0000 mg | Freq: Four times a day (QID) | INTRAMUSCULAR | Status: DC | PRN

## 2021-09-16 MED ORDER — HEPARIN SODIUM (PORCINE) 5000 UNIT/ML IJ SOLN
5000.0000 [IU] | Freq: Three times a day (TID) | INTRAMUSCULAR | Status: DC
  Administered 2021-09-16 – 2021-09-23 (×22): 5000 [IU] via SUBCUTANEOUS
  Filled 2021-09-16 (×22): qty 1

## 2021-09-16 MED ORDER — RISPERIDONE 0.5 MG PO TABS
2.0000 mg | ORAL_TABLET | Freq: Two times a day (BID) | ORAL | Status: DC
  Administered 2021-09-16 – 2021-09-23 (×15): 2 mg via ORAL
  Filled 2021-09-16 (×9): qty 4
  Filled 2021-09-16 (×2): qty 1
  Filled 2021-09-16 (×5): qty 4

## 2021-09-16 MED ORDER — ONDANSETRON HCL 4 MG PO TABS: 4.0000 mg | ORAL_TABLET | Freq: Four times a day (QID) | ORAL | Status: DC | PRN

## 2021-09-16 MED ORDER — ASPIRIN EC 81 MG PO TBEC
81.0000 mg | DELAYED_RELEASE_TABLET | Freq: Every day | ORAL | Status: DC
  Administered 2021-09-16 – 2021-09-23 (×8): 81 mg via ORAL
  Filled 2021-09-16 (×8): qty 1

## 2021-09-16 MED ORDER — ACETAMINOPHEN 650 MG RE SUPP: 650.0000 mg | Freq: Four times a day (QID) | RECTAL | Status: DC | PRN

## 2021-09-16 MED ORDER — TRAZODONE HCL 100 MG PO TABS
200.0000 mg | ORAL_TABLET | Freq: Every day | ORAL | Status: DC
  Administered 2021-09-16 – 2021-09-22 (×7): 200 mg via ORAL
  Filled 2021-09-16 (×7): qty 2

## 2021-09-16 MED ORDER — ACETAMINOPHEN 325 MG PO TABS
650.0000 mg | ORAL_TABLET | Freq: Four times a day (QID) | ORAL | Status: DC | PRN
  Administered 2021-09-16 – 2021-09-18 (×2): 650 mg via ORAL
  Filled 2021-09-16 (×2): qty 2

## 2021-09-16 MED ORDER — ONDANSETRON HCL 4 MG/2ML IJ SOLN
4.0000 mg | Freq: Four times a day (QID) | INTRAMUSCULAR | Status: DC | PRN
  Administered 2021-09-19: 22:00:00 4 mg via INTRAVENOUS
  Filled 2021-09-16: qty 2

## 2021-09-16 NOTE — H&P (Signed)
History and Physical    [name] BCW:[id number] DOB: 25-Oct-1960 DOA: 09/15/2021  PCP: Christain Sacramento, MD  Patient coming from: Home via [facility] ED   Chief Complaint:  Chief Complaint  Patient presents with   Altered Mental Status     HPI:    60 year old female with past medical history of coronary artery disease  (S/P CABG 07/2018 at [facility]), hyperlipidemia, hypertension, insulin-dependent diabetes mellitus type 2, hypothyroidism, asthma, rheumatoid arthritis, diastolic congestive heart failure (Echo 06/2018 EF 65-70% with G2DD), bipolar disorder, obesity who presents to [place] emergency department with family due to concerns over new onset confusion.  Patient is a poor historian due to waxing and waning confusion and therefore the majority of the history is been obtained from discussions with both emergency department staff, neurology as well as family.  Daughter explains that when she spoke to her mother at approximately 4 PM on Monday she was at her baseline.  Shortly after noon on Tuesday daughter noticed that the patient was beginning to exhibit confusion.  Furthermore, patient was noted to have difficulty articulating her words.  Patient did not exhibit any focal weakness or visual changes and did not experience any headache.  Patient was initially brought into [place] emergency department for evaluation.  Upon evaluation at [facility] noncontrast CT imaging of the brain revealed an age-indeterminate left thalamic infarct.  ER provider then discussed case with Dr. Lorrin Goodell with neurology who recommended transfer to [facility] ED for continued work-up including MRI.  At the [facility] emergency department noncontrast MRI of the brain was performed revealing subacute 14 mm left thalamic lacunar infarct with petechial hemorrhage without hemorrhagic transformation or significant mass-effect.  Based on these findings it was recommended that the  patient be admitted to the hospital service.   Review of Systems:   Review of Systems  Unable to perform ROS: Mental status change   Past Medical History:  Diagnosis Date   Asthma    Back pain    CAD (coronary artery disease)    a. 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 She has never used smokeless tobacco. She reports that she does not drink alcohol and does not use drugs.  Allergies  Allergen Reactions   Amoxicillin Swelling and Other (See Comments)    Oral swelling    Doxycycline Nausea And Vomiting   Levofloxacin Other (See Comments)    headache   Propoxyphene N-Acetaminophen Nausea And Vomiting    Family History  Problem Relation Age of Onset   Heart attack Maternal Uncle    Depression Maternal Uncle    Depression Mother    Alcohol abuse Maternal Grandfather    Alcohol abuse Maternal Uncle    Depression Maternal Grandmother      Prior to Admission medications   Medication Sig Start Date End Date Taking? Authorizing Provider  ACCU-CHEK GUIDE test strip  USE TO TEST BLOOD SUGAR 4 TIMES DAILY. 11/25/20   Brita Romp, NP  albuterol (PROVENTIL HFA;VENTOLIN HFA) 108 (90 Base) MCG/ACT inhaler Inhale 2 puffs into the lungs every 6 (six) hours as needed for wheezing or shortness of breath. 07/07/18   Money, Lowry Ram, FNP  amLODipine (NORVASC) 10 MG tablet Take 1 tablet (10 mg total) by mouth daily. For high blood pressure 07/07/18   Money, Lowry Ram, FNP  aspirin EC 81 MG tablet Take 1 tablet (81  mg total) by mouth daily. Swallow whole. 12/25/20   Strader, Fransisco Hertz, PA-C  BD PEN NEEDLE NANO U/F 32G X 4 MM MISC USE AS DIRECTED TWICE DAILY 07/14/21   Nida, Marella Chimes, MD  blood glucose meter kit and supplies KIT 1 each by Does not apply route 4 (four) times daily. Dispense based on patient and insurance preference. Use up to four times daily as directed. (FOR ICD-9 250.00, 250.01). 03/06/20   Cassandria Anger, MD  busPIRone (BUSPAR) 30 MG tablet Take 1 tablet (30 mg total) by mouth 3 (three) times daily. 03/11/21   Cloria Spring, MD  Cholecalciferol (VITAMIN D3) 125 MCG (5000 UT) CAPS Take 1 capsule (5,000 Units total) by mouth daily. 02/14/19   Cassandria Anger, MD  DULoxetine (CYMBALTA) 60 MG capsule Take 1 capsule (60 mg total) by mouth 2 (two) times daily. 03/11/21   Cloria Spring, MD  furosemide (LASIX) 20 MG tablet TAKE 1 TABLET (20 MG TOTAL) BY MOUTH DAILY. 10/03/19 10/02/20  Herminio Commons, MD  glipiZIDE (GLUCOTROL) 10 MG tablet Take 1 tablet (10 mg total) by mouth 2 (two) times daily before a meal. 07/07/18   Money, Lowry Ram, FNP  hydrOXYzine (VISTARIL) 25 MG capsule Take 1 capsule (25 mg total) by mouth 3 (three) times daily as needed. 03/11/21   Cloria Spring, MD  insulin isophane & regular human (NOVOLIN 70/30 FLEXPEN) (70-30) 100 UNIT/ML KwikPen Inject 90 Units into the skin 2 (two) times daily before lunch and supper. 04/03/21   Brita Romp, NP  levothyroxine (SYNTHROID, LEVOTHROID) 50 MCG tablet Take 1 tablet (50 mcg total) by mouth daily before breakfast. For hypothyroidism 07/07/18   Money, Lowry Ram, FNP  metoprolol succinate (TOPROL-XL) 25 MG 24 hr tablet TAKE 1 TABLET EVERY DAY (NEED CARDIOLOGY APPOINTMENT FOR REFILLS) 07/27/21   Ahmed Prima, Fransisco Hertz, PA-C  nitroGLYCERIN (NITROSTAT) 0.4 MG SL tablet Place 1 tablet (0.4 mg total) under the tongue every 5 (five) minutes as needed for chest pain. 12/25/20   Strader, Fransisco Hertz, PA-C  Potassium Chloride ER 20  MEQ TBCR TAKE 1 TABLET EVERY DAY 07/27/21   Strader, [place] M, PA-C  risperiDONE (RISPERDAL) 2 MG tablet Take 1 tablet (2 mg total) by mouth 2 (two) times daily. 03/11/21 03/11/22  Cloria Spring, MD  rosuvastatin (CRESTOR) 40 MG tablet Take 1 tablet (40 mg total) by mouth daily. 11/20/20   Brita Romp, NP  traZODone (DESYREL) 100 MG tablet Take 2 tablets (200 mg total) by mouth at bedtime. 03/11/21   Cloria Spring, MD  Vitamin D, Ergocalciferol, (DRISDOL) 1.25 MG (50000 UNIT) CAPS capsule Take 1 capsule (50,000 Units total) by mouth every 7 (seven) days. 11/20/20   Brita Romp, NP    Physical Exam: Vitals:   09/16/21 0200 09/16/21 0245 09/16/21 0411 09/16/21 0620  BP: 136/75 126/73 (!) 142/79 (!) 104/54  Pulse: (!) 59 (!) 58 66 64  Resp: 14 15 17 19   Temp:  TempSrc:      SpO2: 98% 97% 98% 93%  Weight:      Height:        Constitutional: Awake alert and oriented x2, no associated distress.   Skin: no rashes, no lesions, good skin turgor noted. Eyes: Pupils are equally reactive to light.  No evidence of scleral icterus or conjunctival pallor.  ENMT: Moist mucous membranes noted.  Posterior pharynx clear of any exudate or lesions.   Neck: normal, supple, no masses, no thyromegaly.  No evidence of jugular venous distension.   Respiratory: clear to auscultation bilaterally, no wheezing, no crackles. Normal respiratory effort. No accessory muscle use.  Cardiovascular: Regular rate and rhythm, no murmurs / rubs / gallops. No extremity edema. 2+ pedal pulses. No carotid bruits.  Chest:   Nontender without crepitus or deformity.   Back:   Nontender without crepitus or deformity. Abdomen: Abdomen is soft and nontender.  No evidence of intra-abdominal masses.  Positive bowel sounds noted in all quadrants.   Musculoskeletal: No joint deformity upper and lower extremities. Good ROM, no contractures. Normal muscle tone.  Neurologic: Waxing and waning confusion and disorientation  throughout the interview.  CN 2-12 grossly intact. Sensation intact.  Patient moving all 4 extremities spontaneously.  Patient is following all commands.  Patient is responsive to verbal stimuli.   Psychiatric: Unable to assess due to waxing and waning confusion throughout the interview.  Patient does not seem to possess insight as to her current situation   Labs on Admission: I have personally reviewed following labs and imaging studies -   CBC: Recent Labs  Lab 09/15/21 1651 09/15/21 1739 09/15/21 2335  WBC 6.8  --   --   HGB 14.8 14.3 13.3   13.6  HCT 43.0 42.0 39.0   40.0  MCV 82.2  --   --   PLT 265  --   --    Basic Metabolic Panel: Recent Labs  Lab 09/15/21 1651 09/15/21 1739 09/15/21 2335  NA 135 133* 136   137  K 4.7 7.3* 5.1   5.1  CL 98  --  99  CO2 28  --   --   GLUCOSE 314*  --  232*  BUN 28*  --  36*  CREATININE 1.21*  --  1.10*  CALCIUM 9.2  --   --    GFR: Estimated Creatinine Clearance: 70.9 mL/min (A) (by C-G formula based on SCr of 1.1 mg/dL (H)). Liver Function Tests: Recent Labs  Lab 09/15/21 1651  AST 16  ALT 18  ALKPHOS 82  BILITOT 0.5  PROT 7.6  ALBUMIN 3.9   Recent Labs  Lab 09/15/21 1723  LIPASE 40   Recent Labs  Lab 09/15/21 1723  AMMONIA 36*   Coagulation Profile: Recent Labs  Lab 09/15/21 1723  INR 0.9   Cardiac Enzymes: No results for input(s): CKTOTAL, CKMB, CKMBINDEX, TROPONINI in the last 168 hours. BNP (last 3 results) No results for input(s): PROBNP in the last 8760 hours. HbA1C: Recent Labs    09/16/21 0624  HGBA1C 9.0*   CBG: Recent Labs  Lab 09/16/21 0135  GLUCAP 183*   Lipid Profile: Recent Labs    09/16/21 0624  CHOL 299*  HDL 36*  LDLCALC 226*  TRIG 183*  CHOLHDL 8.3   Thyroid Function Tests: Recent Labs    09/15/21 1723  TSH 1.089  FREET4 0.97   Anemia Panel: No results for input(s): VITAMINB12, FOLATE, FERRITIN, TIBC, IRON, RETICCTPCT in the last 72  hours. Urine analysis:     Component Value Date/Time   COLORURINE YELLOW 07/03/2018 1435   APPEARANCEUR HAZY (A) 07/03/2018 1435   LABSPEC 1.027 07/03/2018 1435   PHURINE 5.0 07/03/2018 1435   GLUCOSEU >=500 (A) 07/03/2018 1435   HGBUR NEGATIVE 07/03/2018 1435   BILIRUBINUR NEGATIVE 07/03/2018 1435   KETONESUR NEGATIVE 07/03/2018 1435   PROTEINUR NEGATIVE 07/03/2018 1435   UROBILINOGEN 0.2 09/09/2010 0130   NITRITE NEGATIVE 07/03/2018 1435   LEUKOCYTESUR NEGATIVE 07/03/2018 1435    Radiological Exams on Admission - Personally Reviewed: CT Head Wo Contrast  Result Date: 09/15/2021 CLINICAL DATA:  Confusion, last seen normal last night, abnormal mental status EXAM: CT HEAD WITHOUT CONTRAST TECHNIQUE: Contiguous axial images were obtained from the base of the skull through the vertex without intravenous contrast. COMPARISON:  07/13/2017 FINDINGS: Brain: Normal ventricular morphology. No midline shift or mass effect. Age-indeterminate LEFT thalamic infarct new since 2018, favor at least subacute. No intracranial hemorrhage, mass lesion, or fluid collections. Additional infarction. No extra-axial Vascular: No hyperdense vessels. Atherosclerotic calcifications of internal carotid arteries at skull base Skull: Intact Sinuses/Orbits: Clear Other: N/A IMPRESSION: Age-indeterminate LEFT thalamic infarct new since 2018, favor at least subacute in age. No other intracranial abnormalities. Electronically Signed   By: Lavonia Dana M.D.   On: 09/15/2021 17:27   MR BRAIN WO CONTRAST  Result Date: 09/16/2021 CLINICAL DATA:  60 year old female with confusion. Abnormal mental status. Age indeterminate left thalamic infarct on head CT yesterday. EXAM: MRI HEAD WITHOUT CONTRAST TECHNIQUE: Multiplanar, multiecho pulse sequences of the brain and surrounding structures were obtained without intravenous contrast. COMPARISON:  Head CT 09/15/2021, 07/13/2017. FINDINGS: Brain: Oval 14 mm area of intense abnormal trace diffusion signal in the  left thalamus corresponding to the CT finding. This appears heterogeneously restricted on ADC. Confluent associated T2 and FLAIR hyperintensity with decreased T1 signal. SWI indicates associated microhemorrhage, but there is no malignant hemorrhagic transformation or significant mass effect. No other restricted diffusion. No midline shift, mass effect, evidence of mass lesion, ventriculomegaly, extra-axial collection or acute intracranial hemorrhage. Cervicomedullary junction and pituitary are within normal limits. Outside of the left thalamus [place] Cables and white matter signal is within normal limits for age throughout the brain. No cortical encephalomalacia. No other cerebral blood products identified on SWI. Basal ganglia, brainstem and cerebellum are within normal limits. Vascular: Major intracranial vascular flow voids are preserved. Skull and upper cervical spine: Negative visible cervical spine. Visualized bone marrow signal is within normal limits. Sinuses/Orbits: Negative orbits. Mild bilateral paranasal sinus mucosal thickening. Other: Mastoids are well aerated. Grossly normal visible internal auditory structures. Negative visible scalp and face. IMPRESSION: 1. Subacute 14 mm left thalamic lacunar infarct with petechial hemorrhage, but no malignant hemorrhagic transformation or significant mass effect. 2. No other acute intracranial abnormality, elsewhere normal for age noncontrast MRI appearance of the brain. Electronically Signed   By: Genevie Ann M.D.   On: 09/16/2021 04:13   DG Chest Portable 1 View  Result Date: 09/15/2021 CLINICAL DATA:  Altered mental status, confusion EXAM: PORTABLE CHEST 1 VIEW COMPARISON:  Portable exam 1713 hours compared to 07/24/2018 FINDINGS: Borderline enlargement of cardiac silhouette post median sternotomy. Mediastinal contours and pulmonary vascularity normal. Lungs clear. No infiltrate, pleural effusion or pneumothorax. IMPRESSION: No acute abnormalities. Electronically  Signed   By: Lavonia Dana M.D.   On: 09/15/2021 17:28    EKG: Personally reviewed.  Rhythm is normal sinus rhythm with heart rate of 63 bpm.  No dynamic ST segment changes  appreciated.  Assessment/Plan  * Thalamic stroke [facility]) Patient presenting with waxing and waning confusion of sudden onset No other focal neurologic deficit noted on exam Discussed case with Dr. Lorrin Goodell who recommends hospitalization and full stroke work-up Performing serial neurologic checks Monitoring patient on telemetry Aspirin monotherapy 81 mg daily per neurology recommendation Daily statin therapy Further imaging to include: CT angiogram of the head neck Obtaining hemoglobin A1c and lipid panel  Echocardiogram PT, OT, SLP evaluation Permissive hypertension with as needed antihypertensives only to be given if blood pressure greater than 220/115 Neurology following in consultation.   Coronary artery disease involving native coronary artery of native heart without angina pectoris Patient is currently chest pain free Monitoring patient on telemetry Continue home regimen of antiplatelet therapy, lipid lowering therapy    Essential hypertension Permissive hypertension As needed intravenous antihypertensives for markedly elevated blood pressures of above 220/115    Type 2 diabetes mellitus with hyperglycemia, with long-term current use of insulin ([place]) Patient been placed on Accu-Cheks before every meal and nightly with sliding scale insulin Continuing home regimen of twice daily high-dose basal insulin therapy Hemoglobin A1C ordered Diabetic Diet   Chronic diastolic CHF (congestive heart failure) (HCC) No clinical evidence of cardiogenic volume overload   Hypothyroidism Resume home regimen of Synthroid    Mixed diabetic hyperlipidemia associated with type 2 diabetes mellitus ([place]) Continuing home regimen of lipid lowering therapy with Crestor Obtaining lipid panel this morning         Code Status:  Full code  code status decision has been confirmed with: daugther Family Communication: Daughter has been updated on plan of care via phone conversation  Status is: Inpatient  Remains inpatient appropriate because: Acute thalamic stroke requiring serial neurologic checks, expert consultation, extensive work-up including neurovascular imaging and multidisciplinary evaluation.        Vernelle Emerald MD [facility] Pager [phone]  If 7PM-7AM, please contact night-coverage [email or web address] Use universal [place] password for that web site. If you do not have the password, please call the hospital operator.  09/16/2021, 7:22 AM

## 2021-09-16 NOTE — Progress Notes (Signed)
Inpatient Diabetes Program Recommendations  AACE/ADA: New Consensus Statement on Inpatient Glycemic Control (2015)  Target Ranges:  Prepandial:   less than 140 mg/dL      Peak postprandial:   less than 180 mg/dL (1-2 hours)      Critically ill patients:  140 - 180 mg/dL   Lab Results  Component Value Date   GLUCAP 218 (H) 09/16/2021   HGBA1C 9.0 (H) 09/16/2021    Review of Glycemic Control  Latest Reference Range & Units 09/16/21 01:35 09/16/21 07:57  Glucose-Capillary 70 - 99 mg/dL 786 (H) 754 (H)   Diabetes history: DM 2 Outpatient Diabetes medications:  Glucotrol 10 mg bid Novolin 70/30 90 units bid Current orders for Inpatient glycemic control:  Novolog 0-15 units tid with meals and HS Novolog 70/30 mix 90 units bid Inpatient Diabetes Program Recommendations:    Note home dose of 70/30 started.  Will follow.  May need to reduce if patient is not eating as much as she was at home.   Thanks,  Beryl Meager, RN, BC-ADM Inpatient Diabetes Coordinator Pager [phone]  (8a-5p)

## 2021-09-16 NOTE — Assessment & Plan Note (Signed)
.   Resume home regimen of Synthroid 

## 2021-09-16 NOTE — Assessment & Plan Note (Signed)
·   Patient presenting with waxing and waning confusion of sudden onset  No other focal neurologic deficit noted on exam  Discussed case with Dr. Derry Lory who recommends hospitalization and full stroke work-up Performing serial neurologic checks Monitoring patient on telemetry Aspirin monotherapy 81 mg daily per neurology recommendation Daily statin therapy Further imaging to include: CT angiogram of the head neck Obtaining hemoglobin A1c and lipid panel  Echocardiogram PT, OT, SLP evaluation Permissive hypertension with as needed antihypertensives only to be given if blood pressure greater than 220/115 Neurology following in consultation.

## 2021-09-16 NOTE — Progress Notes (Signed)
°  Echocardiogram 2D Echocardiogram has been performed.  Delcie Roch 09/16/2021, 9:07 AM

## 2021-09-16 NOTE — ED Notes (Signed)
Pt has some hallucinations at this time. Talks about her keys and some other topics at this time. Able to state her name, state she lives and knows she is in the hospital. Easy to redirect at this time. Continues to be confused. Will continue to monitor.

## 2021-09-16 NOTE — Progress Notes (Signed)
60 year old with history of CAD status post CABG, HLD, HTN, insulin-dependent DM2, hypothyroidism, asthma, arthritis, diastolic CHF EF 65%, grade 2 DD, bipolar disorder, obesity transferred from [facility] due to intermittent confusion.  CT head was negative, MRI brain showed thalamic stroke with petechial hemorrhage.  Neurology recommended routine stroke work-up and was following.  Seen and examined at bedside, no complaints feeling okay.  Vital signs are stable Clear to auscultation bilaterally, not in acute distress.  Regular rate and rhythm.  Abdomen is nontender nondistended  14 mm left thalamic lacunar CVA with petechial hemorrhage - Neurology team following.  Routine stroke work-up.  Already on aspirin.  Echo has been done, results pending.  A1c 9.0, LDL 226  History of CAD status post CABG Chronic diastolic CHF with preserved EF, grade 2 DD - Currently chest pain-free.  CHF appears to be stable without any evidence of volume overload  Essential hypertension - Permissive hypertension  Diabetes mellitus type 2, insulin-dependent - Sliding scale and Accu-Chek.  A1c - 9.0. Consult diabetic coordinatory  Hyperlipidemia - Increase to high intensity statin.  LDL 226.   Hypothyroidism - Synthroid  Stephania Fragmin MD [facility]

## 2021-09-16 NOTE — Evaluation (Signed)
Speech Language Pathology Evaluation Patient Details Name: [name] MRN: [id number] DOB: 12-03-1960 Today's Date: 09/16/2021 Time: 7829-5621 SLP Time Calculation (min) (ACUTE ONLY): 25 min  Problem List:  Patient Active Problem List   Diagnosis Date Noted   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HCC) 09/16/2021   Mixed diabetic hyperlipidemia associated with type 2 diabetes mellitus (HCC) 09/16/2021   Chronic diastolic CHF (congestive heart failure) (HCC) 09/16/2021   Coronary artery disease involving native coronary artery of native heart without angina pectoris 09/16/2021   Vitamin D deficiency 02/14/2019   Mixed hyperlipidemia 01/30/2019   NSTEMI (non-ST elevated myocardial infarction) (HCC) 07/24/2018   Type 2 diabetes mellitus with hyperglycemia, with long-term current use of insulin (HCC) 07/18/2018   Hypothyroidism 07/18/2018   Chest pain 07/17/2018   Bipolar I disorder, current or most recent episode manic, with psychotic features with mixed features (HCC) 07/04/2018   Bipolar disorder, unspecified (HCC) 02/28/2012   Bipolar 1 disorder, depressed (HCC) 12/27/2011   OBESITY, MORBID 07/07/2009   OTHER AND UNSPECIFIED BIPOLAR DISORDERS 07/07/2009   Essential hypertension 07/07/2009   ASTHMA 07/07/2009   CHEST PAIN 07/07/2009   Past Medical History:  Past Medical History:  Diagnosis Date   Asthma    Back pain    CAD (coronary artery disease)    a. 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   Thyroid disease    Past Surgical History:  Past Surgical History:  Procedure Laterality Date   TUBAL LIGATION     Bilateral   HPI:  60 year old female with past  medical history of coronary artery disease  (S/P CABG 07/2018 at [facility]), hyperlipidemia, hypertension, insulin-dependent diabetes mellitus type 2, hypothyroidism, asthma, rheumatoid arthritis, diastolic congestive heart failure (Echo 06/2018 EF 65-70% with G2DD), bipolar disorder, obesity who presented to med [facility] emergency department with family due to concerns over new onset confusion.   09/16/21 MRI head indicated Subacute 14 mm left thalamic lacunar infarct with petechial  hemorrhage, but no malignant hemorrhagic transformation or  significant mass effect; SLE generated   Assessment / Plan / Recommendation Clinical Impression  Pt assessed via SLUMS ([employer] Mental Status Examination) with the following score elicited: 20/30 with deficits noted within the areas of attention, memory and orientation/language.  Pt with neologisms, nonsensical language and aphasic like responses, but bipolar disorder and components of this interfere with differentiating language/memory deficits vs baseline cognitive deficits as no family available to report previous level of function. Pt often off topic during conversation and/or interjections or answers to questions not relevant to conversation.  Perseveration noted throughout with memory tasks given and decreased attention with simple calculation tasks and repeating digits backwards.  Speech intelligibility judged to be good and 100% accurate within simple conversation.  OME normal.  Pt oriented to self only and not time, situation and/or date.  ST will f/u while in acute setting for cognitive-linguistic deficits and to determine baseline level of functioning prior to recent CVA.  Thank you for this consult.     SLP Assessment  SLP Recommendation/Assessment: Patient needs continued Speech Language Pathology Services SLP Visit Diagnosis: Cognitive communication deficit (R41.841)  Recommendations for follow up therapy are one component of a  multi-disciplinary discharge planning process, led by the attending physician.  Recommendations may be updated based on patient status, additional functional criteria and insurance authorization.    Follow Up Recommendations  Follow physician's recommendations for discharge plan and follow up therapies    Assistance Recommended at Discharge  Intermittent Supervision/Assistance  Functional Status Assessment Patient has had a recent decline in their functional status and demonstrates the ability to make significant improvements in function in a reasonable and predictable amount of time.  Frequency and Duration min 1 x/week  1 week      SLP Evaluation Cognition  Overall Cognitive Status: Difficult to assess (d/t mental illness at baseline; no family present) Arousal/Alertness: Awake/alert Orientation Level: Oriented to person;Disoriented to place;Disoriented to time;Disoriented to situation Year: Other (Comment) Month: June Day of Week: Incorrect Attention: Sustained Sustained Attention: Impaired Sustained Attention Impairment: Verbal basic;Functional basic Memory: Impaired Memory Impairment: Retrieval deficit;Decreased recall of new information;Decreased short term memory Decreased Short Term Memory: Verbal basic;Functional basic Immediate Memory Recall: Sock;Blue;Bed Memory Recall Sock: With Cue Memory Recall Blue: With Cue Memory Recall Bed: Not able to recall Awareness: Impaired Behaviors: Perseveration;Impulsive       Comprehension  Auditory Comprehension Overall Auditory Comprehension: Appears within functional limits for tasks assessed Yes/No Questions: Within Functional Limits Commands: Not tested Conversation: Simple Interfering Components: Working [employer] fast streamer;Attention;Other (comment) (bipolar disorder) EffectiveTechniques: Extra processing time;Repetition;Pausing;Stressing words Visual Recognition/Discrimination Discrimination: Not tested Reading Comprehension Reading  Status: Not tested    Expression Expression Primary Mode of Expression: Verbal Verbal Expression Overall Verbal Expression: Appears within functional limits for tasks assessed Level of Generative/Spontaneous Verbalization: Conversation Repetition: No impairment Naming: Impairment Responsive: 76-100% accurate Confrontation: Within functional limits Convergent: 50-74% accurate Divergent: 50-74% accurate Verbal Errors: Perseveration;Confabulation;Neologisms Pragmatics: Unable to assess Interfering Components: Premorbid deficit Non-Verbal Means of Communication: Not applicable Written Expression Dominant Hand: Left Written Expression: Not tested   Oral / Motor  Oral Motor/Sensory Function Overall Oral Motor/Sensory Function: Within functional limits Motor Speech Overall Motor Speech: Appears within functional limits for tasks assessed Respiration: Within functional limits Phonation: Normal Resonance: Within functional limits Articulation: Within functional limitis Intelligibility: Intelligible Motor Planning: Witnin functional limits Interfering Components: Premorbid status            Tressie Stalker, M.S., CCC-SLP 09/16/2021, 2:39 PM

## 2021-09-16 NOTE — ED Notes (Signed)
This RN spoke with pt's daughter on the phone for an update on pt. RN answered all of daughter's questions.

## 2021-09-16 NOTE — ED Notes (Signed)
Pt is eating breakfast at this time. Will continue to monitor.

## 2021-09-16 NOTE — ED Notes (Signed)
Patient transported to MRI 

## 2021-09-16 NOTE — Progress Notes (Signed)
STROKE TEAM PROGRESS NOTE   INTERVAL HISTORY No family at the bedside. Patient resting comfortably in her bed. She is able to communicate affectively in simple conversation, but her memory is affected and she does have some confusion.  According the nursing staff she has been impulsive and getting out of bed without assistance despite being reminded to call for help. MRI scan of the brain shows a left medial thalamic infarct CT angio shows 50 to 70% left ICA and 50% right ICA stenosis.  LDL cholesterol is elevated to 26 mg percent and hemoglobin A1c at 9. Vitals:   09/16/21 0801 09/16/21 0845 09/16/21 0930 09/16/21 1015  BP:  122/62 130/70 112/75  Pulse:  63 67 73  Resp:  18 13 18   Temp: 97.8 F (36.6 C)     TempSrc: Oral     SpO2:  94% 93% 94%  Weight:      Height:       CBC:  Recent Labs  Lab 09/15/21 1651 09/15/21 1739 09/15/21 2335 09/16/21 0755  WBC 6.8  --   --  5.5  NEUTROABS  --   --   --  3.3  HGB 14.8   < > 13.3   13.6 13.3  HCT 43.0   < > 39.0   40.0 39.5  MCV 82.2  --   --  84.2  PLT 265  --   --  236   < > = values in this interval not displayed.   Basic Metabolic Panel:  Recent Labs  Lab 09/15/21 1651 09/15/21 1739 09/15/21 2335 09/16/21 0755  NA 135   < > 136   137 136  K 4.7   < > 5.1   5.1 4.1  CL 98  --  99 103  CO2 28  --   --  24  GLUCOSE 314*  --  232* 216*  BUN 28*  --  36* 23  CREATININE 1.21*  --  1.10* 1.22*  CALCIUM 9.2  --   --  8.7*  MG  --   --   --  1.7   < > = values in this interval not displayed.   Lipid Panel:  Recent Labs  Lab 09/16/21 0624  CHOL 299*  TRIG 183*  HDL 36*  CHOLHDL 8.3  VLDL 37  LDLCALC 226*   HgbA1c:  Recent Labs  Lab 09/16/21 0755  HGBA1C 8.9*   Urine Drug Screen: No results for input(s): LABOPIA, COCAINSCRNUR, LABBENZ, AMPHETMU, THCU, LABBARB in the last 168 hours.  Alcohol Level No results for input(s): ETH in the last 168 hours.  IMAGING past 24 hours CT ANGIO HEAD NECK W WO CM  Result  Date: 09/16/2021 CLINICAL DATA:  There is a 6 mm acute, stroke suspected. Assess intracranial arteries, altered mental status. EXAM: CT ANGIOGRAPHY HEAD AND NECK TECHNIQUE: Multidetector CT imaging of the head and neck was performed using the standard protocol during bolus administration of intravenous contrast. Multiplanar CT image reconstructions and MIPs were obtained to evaluate the vascular anatomy. Carotid stenosis measurements (when applicable) are obtained utilizing NASCET criteria, using the distal internal carotid diameter as the denominator. CONTRAST:  2mL OMNIPAQUE IOHEXOL 350 MG/ML SOLN COMPARISON:  Brain MRI 09/26/2021. Head CT 09/15/2021. FINDINGS: CT HEAD FINDINGS Brain: Cerebral volume appears normal for age. Unchanged size of a 14 mm acute infarct within the ventral left thalamus. Petechial hemorrhage at this site was better appreciated on the brain MRI performed earlier today. No extra-axial fluid collection. No  evidence of an intracranial mass. No midline shift. Vascular: No hyperdense vessel.  Atherosclerotic calcifications. Skull: Normal. Negative for fracture or focal lesion. Orbits: No acute or significant orbital finding. Sinuses: Mild mucosal thickening within the bilateral maxillary sinuses. Review of the MIP images confirms the above findings CTA NECK FINDINGS Aortic arch: Common origin of the innominate and left common carotid arteries. Minimal atherosclerotic plaque within the visualized aortic arch and proximal major branch vessels of the neck. No hemodynamically significant innominate or proximal subclavian artery stenosis. Right carotid system: CCA and ICA patent within the neck. Some calcified atherosclerotic plaque about the carotid bifurcation and within the proximal ICA. Stenosis of the proximal ICA estimated at 50%. Medial course of the CCA. Tortuosity of the cervical ICA. Left carotid system: CCA and ICA patent within the neck. Soft and calcified plaque about the carotid  bifurcation and within the proximal ICA. Stenosis of the proximal ICA estimated at 60-70%. Medial course of the CCA. Tortuosity of the cervical ICA Vertebral arteries: Vertebral arteries codominant and patent within the neck without stenosis. Nonstenotic calcified plaque at the origin of the right vertebral artery. Skeleton: Reversal of the expected cervical lordosis. Cervical spondylosis. No acute bony abnormality or aggressive osseous lesion Other neck: No neck mass or cervical lymphadenopathy. Thyroid unremarkable. Upper chest: No consolidation within the imaged lung apices. Prior median sternotomy. Review of the MIP images confirms the above findings CTA HEAD FINDINGS Anterior circulation: The intracranial internal carotid arteries are patent. Calcified plaque within both vessels with no more than mild stenosis. The M1 middle cerebral arteries are patent. No M2 proximal branch occlusion is identified. Atherosclerotic irregularity of the M2 and more distal middle cerebral artery vessels, bilaterally. Most notably, there are sites of moderate/severe stenosis within mid M2 MCA vessels, bilaterally. The anterior cerebral arteries are patent. No intracranial aneurysm is identified. Posterior circulation: The intracranial vertebral arteries are patent. Mild atherosclerotic narrowing of the V4 left vertebral artery. The basilar artery is patent. The posterior cerebral arteries are patent. Atherosclerotic irregularity of both vessels without high-grade proximal stenosis. Posterior communicating arteries are diminutive or absent bilaterally. Venous sinuses: Within the limitations of contrast timing, no convincing thrombus. Anatomic variants: As described. Review of the MIP images confirms the above findings IMPRESSION: CT head: Redemonstrated 14 mm acute infarct in the left thalamus. Petechial hemorrhage at this site was better appreciated on the brain MRI performed earlier today. CTA neck: 1. The common carotid and  internal carotid arteries are patent within the neck. Estimated atherosclerotic stenosis of the proximal right ICA of 50%. Estimated atherosclerotic stenosis of the proximal left ICA of 60-70%. Consider a carotid artery duplex for further quantification of the stenosis of the proximal left ICA. 2. Vertebral arteries patent within the neck without stenosis. 3.  Aortic Atherosclerosis (ICD10-I70.0). CTA head: 1. No intracranial large vessel occlusion. 2. Intracranial atherosclerotic disease, as outlined. Most notably, there are sites of moderate/severe stenosis within mid M2 MCA vessels, bilaterally. Electronically Signed   By: Jackey Loge D.O.   On: 09/16/2021 07:51   CT Head Wo Contrast  Result Date: 09/15/2021 CLINICAL DATA:  Confusion, last seen normal last night, abnormal mental status EXAM: CT HEAD WITHOUT CONTRAST TECHNIQUE: Contiguous axial images were obtained from the base of the skull through the vertex without intravenous contrast. COMPARISON:  07/13/2017 FINDINGS: Brain: Normal ventricular morphology. No midline shift or mass effect. Age-indeterminate LEFT thalamic infarct new since 2018, favor at least subacute. No intracranial hemorrhage, mass lesion, or fluid collections. Additional  infarction. No extra-axial Vascular: No hyperdense vessels. Atherosclerotic calcifications of internal carotid arteries at skull base Skull: Intact Sinuses/Orbits: Clear Other: N/A IMPRESSION: Age-indeterminate LEFT thalamic infarct new since 2018, favor at least subacute in age. No other intracranial abnormalities. Electronically Signed   By: Lavonia Dana M.D.   On: 09/15/2021 17:27   MR BRAIN WO CONTRAST  Result Date: 09/16/2021 CLINICAL DATA:  60 year old female with confusion. Abnormal mental status. Age indeterminate left thalamic infarct on head CT yesterday. EXAM: MRI HEAD WITHOUT CONTRAST TECHNIQUE: Multiplanar, multiecho pulse sequences of the brain and surrounding structures were obtained without  intravenous contrast. COMPARISON:  Head CT 09/15/2021, 07/13/2017. FINDINGS: Brain: Oval 14 mm area of intense abnormal trace diffusion signal in the left thalamus corresponding to the CT finding. This appears heterogeneously restricted on ADC. Confluent associated T2 and FLAIR hyperintensity with decreased T1 signal. SWI indicates associated microhemorrhage, but there is no malignant hemorrhagic transformation or significant mass effect. No other restricted diffusion. No midline shift, mass effect, evidence of mass lesion, ventriculomegaly, extra-axial collection or acute intracranial hemorrhage. Cervicomedullary junction and pituitary are within normal limits. Outside of the left thalamus Pearline Cables and white matter signal is within normal limits for age throughout the brain. No cortical encephalomalacia. No other cerebral blood products identified on SWI. Basal ganglia, brainstem and cerebellum are within normal limits. Vascular: Major intracranial vascular flow voids are preserved. Skull and upper cervical spine: Negative visible cervical spine. Visualized bone marrow signal is within normal limits. Sinuses/Orbits: Negative orbits. Mild bilateral paranasal sinus mucosal thickening. Other: Mastoids are well aerated. Grossly normal visible internal auditory structures. Negative visible scalp and face. IMPRESSION: 1. Subacute 14 mm left thalamic lacunar infarct with petechial hemorrhage, but no malignant hemorrhagic transformation or significant mass effect. 2. No other acute intracranial abnormality, elsewhere normal for age noncontrast MRI appearance of the brain. Electronically Signed   By: Genevie Ann M.D.   On: 09/16/2021 04:13   DG Chest Portable 1 View  Result Date: 09/15/2021 CLINICAL DATA:  Altered mental status, confusion EXAM: PORTABLE CHEST 1 VIEW COMPARISON:  Portable exam 1713 hours compared to 07/24/2018 FINDINGS: Borderline enlargement of cardiac silhouette post median sternotomy. Mediastinal contours  and pulmonary vascularity normal. Lungs clear. No infiltrate, pleural effusion or pneumothorax. IMPRESSION: No acute abnormalities. Electronically Signed   By: Lavonia Dana M.D.   On: 09/15/2021 17:28    PHYSICAL EXAM Obese middle-aged Caucasian lady not in distress. . Afebrile. Head is nontraumatic. Neck is supple without bruit.    Cardiac exam no murmur or gallop. Lungs are clear to auscultation. Distal pulses are well felt.  Neurological Exam ;  Awake  Alert oriented x 3.  Diminished recall 0/3.  Able to name only 6 animals which can walk on 4 legs.  Normal speech and language.eye movements full without nystagmus.fundi were not visualized. Vision acuity and fields appear normal. Hearing is normal. Palatal movements are normal. Face symmetric. Tongue midline. Normal strength, tone, reflexes and coordination. Normal sensation. Gait deferred.  ASSESSMENT/PLAN Ms. OLENE DUKART is a 60 y.o. female with history of DM2, HTN, HLD, hypothyroidism, Rheumatoid arthritis, diastolic congestive heart failure (Echo 06/2018 EF 65-70% with G2DD), bipolar disorder, obesity  presenting with confusion and difficulty speaking.  Last known well was 4 PM 09/14/2021.  She did not receive TNK, not a candidate for interventional radiology given low concern for LVO.  Stroke: Left thalamic lacunar infarct with petechial hemorrhage likely secondary due to occlusion or stenosis source Code Stroke Notable for age-indeterminate  LEFT thalamic infarct new since 2018, favor at least subacute in age MRI  Subacute 14 mm left thalamic lacunar infarct with petechial hemorrhage, but no malignant hemorrhagic transformation or significant mass effect CTA Head-no intracranial LVO, intracranial atherosclerotic disease, moderate to severe stenosis within the mid M2 MCA vessels bilaterally CTA Neck-left ICA stenosis 60 to 70% 2D Echo EF 60 to 65% normal left ventricular function, no shunt LDL 226 HgbA1c 8.9 VTE prophylaxis -  SCDs aspirin 81 mg daily prior to admission, now on aspirin 81 mg daily and clopidogrel 75 mg daily.  For 3 weeks and then Plavix alone Therapy recommendations:  pending Disposition:  pending  Hypertension Home meds:  metoprolol succinate 25mg  Stable Permissive hypertension (OK if < 220/120) but gradually normalize in 5-7 days Long-term BP goal normotensive  Hyperlipidemia Home meds:  crestor 40mg , resumed in hospital LDL 226, goal < 70 Add zetia 10mg   High intensity statin  Continue statin at discharge  Diabetes type II Uncontrolled Home meds:  glipizide 10mg , insulin 70/30 Carb controlled diet HgbA1c 8.9, goal < 7.0 CBGs SSI 0-15 u novalog 90u 70/30 BID  Other Stroke Risk Factors Morbid Obesity BMI: 48.06 Counseled on importance of physical exercise and weight loss Coronary artery disease No chest pain reported at this time  Other Active Problems Hypothyroidism Synthroid resumed by primary team Chronic diastolic HF Monitor for volume overload  Hospital day # 0  Patient seen and examined by NP/APP with MD. MD to update note as needed.   Janine Ores, DNP, FNP-BC [facility] Pager: [phone]  STROKE MD NOTE : I have personally obtained history,examined this patient, reviewed notes, independently viewed imaging studies, participated in medical decision making and plan of care.ROS completed by me personally and pertinent positives fully documented  I have made any additions or clarifications directly to the above note. Agree with note above.  Patient presented with confusion and difficulty speaking secondary to medial thalamic lacunar infarct likely from small vessel disease.  Recommend aspirin Plavix for 3 weeks followed by Plavix alone and aggressive risk factor modification.  Check echocardiogram.  Greater than 50% time during this 35-minute visit was spent in counseling and coordination of care about her stroke and discussing plan of care prevention.   Discussed with Dr. Reesa Chew.  Stroke team will sign off.  Kindly call for questions.  Antony Contras, MD Medical Director [facility] Pager: [phone] 09/16/2021 9:29 PM   To contact Stroke Continuity provider, please refer to [email or web address] After hours, contact General Neurology

## 2021-09-16 NOTE — Assessment & Plan Note (Signed)
•   Permissive hypertension  As needed intravenous antihypertensives for markedly elevated blood pressures of above 220/115

## 2021-09-16 NOTE — Assessment & Plan Note (Signed)
•   No clinical evidence of cardiogenic volume overload ° °

## 2021-09-16 NOTE — Assessment & Plan Note (Signed)
?   Patient is currently chest pain free ?? Monitoring patient on telemetry ?? Continue home regimen of antiplatelet therapy, lipid lowering therapy  ? ?

## 2021-09-16 NOTE — Assessment & Plan Note (Signed)
•   Continuing home regimen of lipid lowering therapy with Crestor  Obtaining lipid panel this morning

## 2021-09-16 NOTE — ED Notes (Signed)
Found patient standing up out of the bed with brief, purewick, and monitor items ripped off. Put patient back in bed and put on brief, purewick, and hooked patient up to the monitor. RN notified

## 2021-09-16 NOTE — Consult Note (Signed)
NEUROLOGY CONSULTATION NOTE   Date of service: September 16, 2021 Patient Name: [name] MRN:  [id number] DOB:  08-07-1961 Reason for consult: "Thalamic stroke" Requesting Provider: Marinda Elk, MD _ _ _   _ __   _ __ _ _  __ __   _ __   __ _  History of Present Illness  [name] is a 60 y.o. female with PMH significant for DM2, HTN, HLD, hypothyroidism, Rheumatoid arthritis who presented with confusion.  Daughter spoke to her on Monday 1600 when she was fine. On Tuesday around 1215, was noted to be confused, losing her train of thought. Seemed more confused when daughter spoke to her later.  She seems to be saying things that were difficult to understand.  She does not some, no alcohol use, has DM2, HTN, HLD.  Family history of stroke in mother.  mRS: 0 tNKAse: outside window Thrombectomy: not offered due to low concern for LVO. NIHSS components Score: Comment  1a Level of Conscious 0[x]  1[]  2[]  3[]      1b LOC Questions 0[x]  1[]  2[]       1c LOC Commands 0[x]  1[]  2[]       2 Best Gaze 0[x]  1[]  2[]       3 Visual 0[x]  1[]  2[]  3[]      4 Facial Palsy 0[x]  1[]  2[]  3[]      5a Motor Arm - left 0[x]  1[]  2[]  3[]  4[]  UN[]    5b Motor Arm - Right 0[x]  1[]  2[]  3[]  4[]  UN[]    6a Motor Leg - Left 0[x]  1[]  2[]  3[]  4[]  UN[]    6b Motor Leg - Right 0[x]  1[]  2[]  3[]  4[]  UN[]    7 Limb Ataxia 0[x]  1[]  2[]  3[]  UN[]     8 Sensory 0[x]  1[]  2[]  UN[]      9 Best Language 0[x]  1[]  2[]  3[]      10 Dysarthria 0[x]  1[]  2[]  UN[]      11 Extinct. and Inattention 0[x]  1[]  2[]       TOTAL: 0      ROS   Constitutional Denies weight loss, fever and chills.   HEENT Denies changes in vision and hearing.   Respiratory Denies SOB and cough.   CV Denies palpitations and CP   GI Denies abdominal pain, nausea, vomiting and diarrhea.   GU Denies dysuria and urinary frequency.   MSK Denies myalgia and joint pain.   Skin Denies rash and pruritus.   Neurological Denies headache and syncope.    Psychiatric Denies recent changes in mood. Denies anxiety and depression.    Past History   Past Medical History:  Diagnosis Date   Asthma    Back pain    CAD (coronary artery disease)    a. 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   Thyroid disease    Past Surgical History:  Procedure Laterality Date   TUBAL LIGATION     Bilateral   Family History  Problem Relation Age of Onset   Heart attack Maternal Uncle    Depression Maternal Uncle    Depression Mother    Alcohol abuse Maternal Grandfather  Alcohol abuse Maternal Uncle    Depression Maternal Grandmother    Social History   Socioeconomic History   Marital status: Widowed    Spouse name: Not on file   Number of children: Not on file   Years of education: Not on file   Highest education level: Not on file  Occupational History   Occupation: Disabled    Employer: UNEMPLOYED  Tobacco Use   Smoking status: Never   Smokeless tobacco: Never  Vaping Use   Vaping Use: Never used  Substance and Sexual Activity   Alcohol use: No    Alcohol/week: 0.0 standard drinks   Drug use: No    Comment: 05/13/2016 per pt no   Sexual activity: Yes    Birth control/protection: Surgical  Other Topics Concern   Not on file  Social History Narrative   Married   No regular exercise   Social Determinants of Health   Financial Resource Strain: Not on file  Food Insecurity: Not on file  Transportation Needs: Not on file  Physical Activity: Not on file  Stress: Not on file  Social Connections: Not on file   Allergies  Allergen Reactions   Amoxicillin Swelling and Other  (See Comments)    Oral swelling    Doxycycline Nausea And Vomiting   Levofloxacin Other (See Comments)    headache   Propoxyphene N-Acetaminophen Nausea And Vomiting    Medications  (Not in a hospital admission)    Vitals   Vitals:   09/16/21 0115 09/16/21 0200 09/16/21 0245 09/16/21 0411  BP: 117/65 136/75 126/73 (!) 142/79  Pulse: (!) 58 (!) 59 (!) 58 66  Resp: 17 14 15 17   Temp:      TempSrc:      SpO2: 96% 98% 97% 98%  Weight:      Height:         Body mass index is 48.06 kg/m.  Physical Exam   General: Laying comfortably in bed; in no acute distress.  HENT: Normal oropharynx and mucosa. Normal external appearance of ears and nose.  Neck: Supple, no pain or tenderness  CV: No JVD. No peripheral edema.  Pulmonary: Symmetric Chest rise. Normal respiratory effort.  Abdomen: Soft to touch, non-tender.  Ext: No cyanosis, edema, or deformity  Skin: No rash. Normal palpation of skin.   Musculoskeletal: Normal digits and nails by inspection. No clubbing.   Neurologic Examination  Mental status/Cognition: Alert, oriented to self, place, month and year, good attention.  Speech/language: Fluent, comprehension intact, object naming intact, repetition intact.  Cranial nerves:   CN II Pupils equal and reactive to light, no VF deficits    CN III,IV,VI EOM intact, no gaze preference or deviation, no nystagmus    CN V normal sensation in V1, V2, and V3 segments bilaterally    CN VII no asymmetry, no nasolabial fold flattening    CN VIII normal hearing to speech    CN IX & X normal palatal elevation, no uvular deviation    CN XI 5/5 head turn and 5/5 shoulder shrug bilaterally    CN XII midline tongue protrusion    Motor:  Muscle bulk: normal, tone normal, pronator drift none tremor none Mvmt Root Nerve  Muscle Right Left Comments  SA C5/6 Ax Deltoid 5 5   EF C5/6 Mc Biceps 5 5   EE C6/7/8 Rad Triceps 5 5   WF C6/7 Med FCR     WE C7/8 PIN ECU     F Ab  C8/T1 U  ADM/FDI 5 5   HF L1/2/3 Fem Illopsoas 5 5   KE L2/3/4 Fem Quad 5 5   DF L4/5 D Peron Tib Ant 5 5   PF S1/2 Tibial Grc/Sol 5 5    Reflexes:  Right Left Comments  Pectoralis      Biceps (C5/6) 1 1   Brachioradialis (C5/6) 1 1    Triceps (C6/7) 1 1    Patellar (L3/4) 1 1    Achilles (S1)      Hoffman      Plantar     Jaw jerk    Sensation:  Light touch Intact throughout   Pin prick    Temperature    Vibration   Proprioception    Coordination/Complex Motor:  - Finger to Nose intact BL - Heel to shin intact BL - Rapid alternating movement are normal - Gait: deferred due to patient safety.  Labs   CBC:  Recent Labs  Lab 09/15/21 1651 09/15/21 1739 09/15/21 2335  WBC 6.8  --   --   HGB 14.8 14.3 13.3   13.6  HCT 43.0 42.0 39.0   40.0  MCV 82.2  --   --   PLT 265  --   --     Basic Metabolic Panel:  Lab Results  Component Value Date   NA 137 09/15/2021   NA 136 09/15/2021   K 5.1 09/15/2021   K 5.1 09/15/2021   CO2 28 09/15/2021   GLUCOSE 232 (H) 09/15/2021   BUN 36 (H) 09/15/2021   CREATININE 1.10 (H) 09/15/2021   CALCIUM 9.2 09/15/2021   GFRNONAA 51 (L) 09/15/2021   GFRAA 62 11/06/2020   Lipid Panel:  Lab Results  Component Value Date   LDLCALC 243 (H) 11/06/2020   HgbA1c:  Lab Results  Component Value Date   HGBA1C 10.6 (A) 04/03/2021   Urine Drug Screen:     Component Value Date/Time   LABOPIA NONE DETECTED 07/03/2018 1435   COCAINSCRNUR NONE DETECTED 07/03/2018 1435   LABBENZ NONE DETECTED 07/03/2018 1435   AMPHETMU NONE DETECTED 07/03/2018 1435   THCU NONE DETECTED 07/03/2018 1435   LABBARB NONE DETECTED 07/03/2018 1435    Alcohol Level     Component Value Date/Time   ETH <10 07/03/2018 1719    CT Head without contrast(Personally reviewed): Notable for age-indeterminate LEFT thalamic infarct new since 2018, favor at least subacute in age.  MR Angio head without contrast and Carotid Duplex BL(Personally reviewed): pending  MRI  Brain(Personally reviewed): 1. Subacute 14 mm left thalamic lacunar infarct with petechial hemorrhage, but no malignant hemorrhagic transformation or significant mass effect.  2. No other acute intracranial abnormality, elsewhere normal for age noncontrast MRI appearance of the brain.  Impression   EIBHLEANN RAUBER is a 60 y.o. female with PMH significant for DM2, HTN, HLD, hypothyroidism, Rheumatoid arthritis who presented with confusion. 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 Her neurologic examination is notable for poor attention and losing her train of thoughts. Probably could be explained by the involvement of the projections from the reticular formation.  Primary Diagnosis:  Other cerebral infarction due to occlusion of stenosis of small artery.  Secondary Diagnosis: Essential (primary) hypertension, Type 2 diabetes mellitus with hyperglycemia , and Morbid Obesity(BMI > 40)  Recommendations  Plan:  - Frequent Neuro checks per stroke unit protocol - Recommend Vascular imaging with MRA Angio Head without contrast and US Carotid doppler - Recommend obtaining TTE - Recommend obtaining  Lipid panel with LDL - Please start statin if LDL > 70 - Recommend HbA1c - Antithrombotic - Aspirin 81mg  daily. - Recommend DVT ppx - SBP goal - permissive hypertension first 24 h < 220/110. Held home meds.  - Recommend Telemetry monitoring for arrythmia - Recommend bedside swallow screen prior to PO intake. - Stroke education booklet - Recommend PT/OT/SLP consult   ______________________________________________________________________   Thank you for the opportunity to take part in the care of this patient. If you have any further questions, please contact the neurology consultation attending.  Signed,  [place] Pancho Pager Number [phone] _ _ _   _ __   _ __ _ _  __ __   _ __   __ _

## 2021-09-16 NOTE — Assessment & Plan Note (Signed)
•   Patient been placed on Accu-Cheks before every meal and nightly with sliding scale insulin  Continuing home regimen of twice daily high-dose basal insulin therapy  Hemoglobin A1C ordered  Diabetic Diet

## 2021-09-17 DIAGNOSIS — R4701 Aphasia: Secondary | ICD-10-CM | POA: Diagnosis not present

## 2021-09-17 DIAGNOSIS — I5032 Chronic diastolic (congestive) heart failure: Secondary | ICD-10-CM | POA: Diagnosis not present

## 2021-09-17 DIAGNOSIS — I6381 Other cerebral infarction due to occlusion or stenosis of small artery: Secondary | ICD-10-CM | POA: Diagnosis not present

## 2021-09-17 LAB — GLUCOSE, CAPILLARY
Glucose-Capillary: 88 mg/dL (ref 70–99)
Glucose-Capillary: 142 mg/dL — ABNORMAL HIGH (ref 70–99)
Glucose-Capillary: 226 mg/dL — ABNORMAL HIGH (ref 70–99)
Glucose-Capillary: 42 mg/dL — CL (ref 70–99)
Glucose-Capillary: 44 mg/dL — CL (ref 70–99)
Glucose-Capillary: 100 mg/dL — ABNORMAL HIGH (ref 70–99)
Glucose-Capillary: 53 mg/dL — ABNORMAL LOW (ref 70–99)
Glucose-Capillary: 55 mg/dL — ABNORMAL LOW (ref 70–99)

## 2021-09-17 LAB — COMPREHENSIVE METABOLIC PANEL
ALT: 14 U/L (ref 0–44)
AST: 13 U/L — ABNORMAL LOW (ref 15–41)
Albumin: 3.1 g/dL — ABNORMAL LOW (ref 3.5–5.0)
Alkaline Phosphatase: 69 U/L (ref 38–126)
Anion gap: 8 (ref 5–15)
BUN: 32 mg/dL — ABNORMAL HIGH (ref 8–23)
CO2: 28 mmol/L (ref 22–32)
Calcium: 9.1 mg/dL (ref 8.9–10.3)
Chloride: 101 mmol/L (ref 98–111)
Creatinine, Ser: 1.45 mg/dL — ABNORMAL HIGH (ref 0.44–1.00)
GFR, Estimated: 41 mL/min — ABNORMAL LOW (ref >60)
Glucose, Bld: 84 mg/dL (ref 70–99)
Potassium: 3.6 mmol/L (ref 3.5–5.1)
Sodium: 137 mmol/L (ref 135–145)
Total Bilirubin: 0.7 mg/dL (ref 0.3–1.2)
Total Protein: 6.1 g/dL — ABNORMAL LOW (ref 6.5–8.1)

## 2021-09-17 LAB — CBC
HCT: 37.9 % (ref 36.0–46.0)
Hemoglobin: 12.5 g/dL (ref 12.0–15.0)
MCH: 27.5 pg (ref 26.0–34.0)
MCHC: 33 g/dL (ref 30.0–36.0)
MCV: 83.5 fL (ref 80.0–100.0)
Platelets: 268 10*3/uL (ref 150–400)
RBC: 4.54 MIL/uL (ref 3.87–5.11)
RDW: 12.7 % (ref 11.5–15.5)
WBC: 6.3 10*3/uL (ref 4.0–10.5)
nRBC: 0 % (ref 0.0–0.2)

## 2021-09-17 LAB — MAGNESIUM: Magnesium: 1.9 mg/dL (ref 1.7–2.4)

## 2021-09-17 MED ORDER — INSULIN ASPART PROT & ASPART (70-30 MIX) 100 UNIT/ML ~~LOC~~ SUSP
70.0000 [IU] | Freq: Two times a day (BID) | SUBCUTANEOUS | Status: DC
  Filled 2021-09-17: qty 10

## 2021-09-17 NOTE — Progress Notes (Signed)
PROGRESS NOTE    DUA DISHON  [id number] DOB: August 05, 1961 DOA: 09/15/2021 PCP: Christain Sacramento, MD   Brief Narrative:  60 year old with history of CAD status post CABG, HLD, HTN, insulin-dependent DM2, hypothyroidism, asthma, arthritis, diastolic CHF EF [id number], grade 2 DD, bipolar disorder, obesity transferred from [facility] due to intermittent confusion.  CT head was negative, MRI brain showed thalamic stroke with petechial hemorrhage.  Neurology recommended routine stroke work-up and was following. Echo 60% g2DD, A1C 9.0, LDL 226. ASA + Plavix x 3 weeks followed by Plavix alone.   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  A1c 9.0, LDL 226. Echo 60% g2DD. Neuro rec- ASA + plavix for 3 weeks followed by Plavix.    History of CAD status post CABG Chronic diastolic CHF with preserved EF, grade 2 DD - Currently chest pain-free.  CHF appears to be stable without any evidence of volume overload   Essential hypertension - Permissive hypertension   Diabetes mellitus type 2, insulin-dependent - Sliding scale and Accu-Chek.  A1c - 9.0. Consult diabetic coordinatory   Hyperlipidemia - Increase to high intensity statin.  LDL 226. Zetia added.    Hypothyroidism - Synthroid   DVT prophylaxis: SQ Heparin.  Code Status: Full Code Family Communication:  Carmen updated.   Status is: Inpatient  Remains inpatient appropriate because: Pending PT OT thereafter arrange for safe Dispo.     Subjective: Resting, no complaints. Doesn't remember me from yesterday. Very forgetful.   Review of Systems Otherwise negative except as per HPI, including: General: Denies fever, chills, night sweats or unintended weight loss. Resp: Denies cough, wheezing, shortness of breath. Cardiac: Denies chest pain, palpitations, orthopnea, paroxysmal nocturnal dyspnea. GI: Denies abdominal pain, nausea, vomiting, diarrhea or constipation GU: Denies dysuria, frequency, hesitancy or incontinence MS: Denies muscle aches, joint pain or swelling Neuro: Denies headache, neurologic deficits (focal weakness, numbness, tingling), abnormal gait Psych: Denies anxiety, depression, SI/HI/AVH Skin: Denies new rashes or lesions ID: Denies sick contacts, exotic exposures, travel  Examination:  Constitutional: Not in acute distress Respiratory: Clear to auscultation bilaterally Cardiovascular: Normal sinus rhythm, no rubs Abdomen: Nontender nondistended good bowel sounds Musculoskeletal: No edema noted Skin: No rashes seen Neurologic: Non focal but forgetful  Psychiatric: Poor judgement and insight.      Objective: Vitals:   09/16/21 2023 09/16/21 2338 09/17/21 0334 09/17/21 0746  BP: (!) 155/77 (!) 143/68 (!) 162/82 (!) 152/87  Pulse: 80 78 74 71  Resp: 18 19 15 16   Temp: 97.8 F (36.6 C) (!) 97.4 F (36.3 C) 97.8 F (36.6 C) 97.9 F (36.6 C)  TempSrc: Oral Oral Oral Oral  SpO2: 98% 94% 96% 95%  Weight:      Height:        Intake/Output Summary (Last 24 hours) at 09/17/2021 1123 Last data filed at 09/17/2021 0747 Gross per 24 hour  Intake --  Output 1000 ml  Net -1000 ml   Filed Weights   09/15/21 1632  Weight: 127 kg     Data Reviewed:  CBC: Recent Labs  Lab 09/15/21 1651 09/15/21 1739 09/15/21 2335 09/16/21 0755 09/17/21 0145  WBC 6.8  --   --  5.5 6.3  NEUTROABS  --   --   --  3.3  --   HGB 14.8 14.3 13.3   13.6 13.3 12.5  HCT 43.0 42.0 39.0   40.0 39.5 37.9   MCV 82.2  --   --  84.2 83.5  PLT 265  --   --  236 268   Basic Metabolic Panel: Recent Labs  Lab 09/15/21 1651 09/15/21 1739 09/15/21 2335 09/16/21 0755 09/17/21 0145  NA 135 133* 136   137 136 137  K 4.7 7.3* 5.1   5.1 4.1 3.6  CL 98  --  99 103 101  CO2 28  --   --  24 28  GLUCOSE 314*  --  232* 216* 84  BUN 28*  --  36* 23 32*  CREATININE 1.21*  --  1.10* 1.22* 1.45*  CALCIUM 9.2  --   --  8.7* 9.1  MG  --   --   --  1.7 1.9   GFR: Estimated Creatinine Clearance: 53.8 mL/min (A) (by C-G formula based on SCr of 1.45 mg/dL (H)). Liver Function Tests: Recent Labs  Lab 09/15/21 1651 09/16/21 0755 09/17/21 0145  AST 16 16 13*  ALT 18 15 14   ALKPHOS 82 69 69  BILITOT 0.5 1.0 0.7  PROT 7.6 6.0* 6.1*  ALBUMIN 3.9 3.2* 3.1*   Recent Labs  Lab 09/15/21 1723  LIPASE 40   Recent Labs  Lab 09/15/21 1723  AMMONIA 36*   Coagulation Profile: Recent Labs  Lab 09/15/21 1723  INR 0.9   Cardiac Enzymes: No results for input(s): CKTOTAL, CKMB, CKMBINDEX, TROPONINI in the last 168 hours. BNP (last 3 results) No results for input(s): PROBNP in the last 8760 hours. HbA1C: Recent Labs    09/16/21 0624 09/16/21 0755  HGBA1C 9.0* 8.9*   CBG: Recent Labs  Lab 09/16/21 0757 09/16/21 1123 09/16/21 1631 09/16/21 2236 09/17/21 0939  GLUCAP 218* 165* 114* 83 88   Lipid Profile: Recent Labs    09/16/21 0624  CHOL 299*  HDL 36*  LDLCALC 226*  TRIG 183*  CHOLHDL 8.3   Thyroid Function Tests: Recent Labs    09/15/21 1723  TSH 1.089  FREET4 0.97   Anemia Panel: No results for input(s): VITAMINB12, FOLATE, FERRITIN, TIBC, IRON, RETICCTPCT in the last 72 hours. Sepsis Labs: No results for input(s): PROCALCITON, LATICACIDVEN in the last 168 hours.  Recent Results (from the past 240 hour(s))  Resp Panel by RT-PCR (Flu A&B, Covid) Nasopharyngeal Swab     Status: None   Collection Time: 09/15/21  5:23 PM   Specimen: Nasopharyngeal Swab; Nasopharyngeal(NP)  swabs in vial transport medium  Result Value Ref Range Status   SARS Coronavirus 2 by RT PCR NEGATIVE NEGATIVE Final    Comment: (NOTE) SARS-CoV-2 target nucleic acids are NOT DETECTED.  The SARS-CoV-2 RNA is generally detectable in upper respiratory specimens during the acute phase of infection. The lowest concentration of SARS-CoV-2 viral copies this assay can detect is 138 copies/mL. A negative result does not preclude SARS-Cov-2 infection and should not be used as the sole basis for treatment or other patient management decisions. A negative result may occur with  improper specimen collection/handling, submission of specimen other than nasopharyngeal swab, presence of viral mutation(s) within the areas targeted by this assay, and inadequate number of viral copies(<138 copies/mL). A  negative result must be combined with clinical observations, patient history, and epidemiological information. The expected result is Negative.  Fact Sheet for Patients:  EntrepreneurPulse.com.au  Fact Sheet for Healthcare Providers:  IncredibleEmployment.be  This test is no t yet approved or cleared by the [place] FDA and  has been authorized for detection and/or diagnosis of SARS-CoV-2 by FDA under an Emergency Use Authorization (EUA). This EUA will remain  in effect (meaning this test can be used) for the duration of the COVID-19 declaration under Section 564(b)(1) of the Act, 21 U.S.C.section 360bbb-3(b)(1), unless the authorization is terminated  or revoked sooner.       Influenza A by PCR NEGATIVE NEGATIVE Final   Influenza B by PCR NEGATIVE NEGATIVE Final    Comment: (NOTE) The Xpert Xpress SARS-CoV-2/FLU/RSV plus assay is intended as an aid in the diagnosis of influenza from Nasopharyngeal swab specimens and should not be used as a sole basis for treatment. Nasal washings and aspirates are unacceptable for Xpert Xpress  SARS-CoV-2/FLU/RSV testing.  Fact Sheet for Patients: EntrepreneurPulse.com.au  Fact Sheet for Healthcare Providers: IncredibleEmployment.be  This test is not yet approved or cleared by the [place] FDA and has been authorized for detection and/or diagnosis of SARS-CoV-2 by FDA under an Emergency Use Authorization (EUA). This EUA will remain in effect (meaning this test can be used) for the duration of the COVID-19 declaration under Section 564(b)(1) of the Act, 21 U.S.C. section 360bbb-3(b)(1), unless the authorization is terminated or revoked.  Performed at [facility], [address], [place], [place] [address]          Radiology Studies: CT ANGIO HEAD NECK W WO CM  Result Date: 09/16/2021 CLINICAL DATA:  There is a 6 mm acute, stroke suspected. Assess intracranial arteries, altered mental status. EXAM: CT ANGIOGRAPHY HEAD AND NECK TECHNIQUE: Multidetector CT imaging of the head and neck was performed using the standard protocol during bolus administration of intravenous contrast. Multiplanar CT image reconstructions and MIPs were obtained to evaluate the vascular anatomy. Carotid stenosis measurements (when applicable) are obtained utilizing NASCET criteria, using the distal internal carotid diameter as the denominator. CONTRAST:  8mL OMNIPAQUE IOHEXOL 350 MG/ML SOLN COMPARISON:  Brain MRI 09/26/2021. Head CT 09/15/2021. FINDINGS: CT HEAD FINDINGS Brain: Cerebral volume appears normal for age. Unchanged size of a 14 mm acute infarct within the ventral left thalamus. Petechial hemorrhage at this site was better appreciated on the brain MRI performed earlier today. No extra-axial fluid collection. No evidence of an intracranial mass. No midline shift. Vascular: No hyperdense vessel.  Atherosclerotic calcifications. Skull: Normal. Negative for fracture or focal lesion. Orbits: No acute or significant orbital finding. Sinuses: Mild  mucosal thickening within the bilateral maxillary sinuses. Review of the MIP images confirms the above findings CTA NECK FINDINGS Aortic arch: Common origin of the innominate and left common carotid arteries. Minimal atherosclerotic plaque within the visualized aortic arch and proximal major branch vessels of the neck. No hemodynamically significant innominate or proximal subclavian artery stenosis. Right carotid system: CCA and ICA patent within the neck. Some calcified atherosclerotic plaque about the carotid bifurcation and within the proximal ICA. Stenosis of the proximal ICA estimated at 50%. Medial course of the CCA. Tortuosity of the cervical ICA. Left carotid system: CCA and ICA patent within the neck. Soft and calcified plaque about the carotid bifurcation and within the proximal ICA. Stenosis of the proximal ICA estimated at 60-70%. Medial course of the CCA. Tortuosity of the cervical ICA  Vertebral arteries: Vertebral arteries codominant and patent within the neck without stenosis. Nonstenotic calcified plaque at the origin of the right vertebral artery. Skeleton: Reversal of the expected cervical lordosis. Cervical spondylosis. No acute bony abnormality or aggressive osseous lesion Other neck: No neck mass or cervical lymphadenopathy. Thyroid unremarkable. Upper chest: No consolidation within the imaged lung apices. Prior median sternotomy. Review of the MIP images confirms the above findings CTA HEAD FINDINGS Anterior circulation: The intracranial internal carotid arteries are patent. Calcified plaque within both vessels with no more than mild stenosis. The M1 middle cerebral arteries are patent. No M2 proximal branch occlusion is identified. Atherosclerotic irregularity of the M2 and more distal middle cerebral artery vessels, bilaterally. Most notably, there are sites of moderate/severe stenosis within mid M2 MCA vessels, bilaterally. The anterior cerebral arteries are patent. No intracranial aneurysm  is identified. Posterior circulation: The intracranial vertebral arteries are patent. Mild atherosclerotic narrowing of the V4 left vertebral artery. The basilar artery is patent. The posterior cerebral arteries are patent. Atherosclerotic irregularity of both vessels without high-grade proximal stenosis. Posterior communicating arteries are diminutive or absent bilaterally. Venous sinuses: Within the limitations of contrast timing, no convincing thrombus. Anatomic variants: As described. Review of the MIP images confirms the above findings IMPRESSION: CT head: Redemonstrated 14 mm acute infarct in the left thalamus. Petechial hemorrhage at this site was better appreciated on the brain MRI performed earlier today. CTA neck: 1. The common carotid and internal carotid arteries are patent within the neck. Estimated atherosclerotic stenosis of the proximal right ICA of 50%. Estimated atherosclerotic stenosis of the proximal left ICA of 60-70%. Consider a carotid artery duplex for further quantification of the stenosis of the proximal left ICA. 2. Vertebral arteries patent within the neck without stenosis. 3.  Aortic Atherosclerosis (ICD10-I70.0). CTA head: 1. No intracranial large vessel occlusion. 2. Intracranial atherosclerotic disease, as outlined. Most notably, there are sites of moderate/severe stenosis within mid M2 MCA vessels, bilaterally. Electronically Signed   By: Kellie Simmering D.O.   On: 09/16/2021 07:51   CT Head Wo Contrast  Result Date: 09/15/2021 CLINICAL DATA:  Confusion, last seen normal last night, abnormal mental status EXAM: CT HEAD WITHOUT CONTRAST TECHNIQUE: Contiguous axial images were obtained from the base of the skull through the vertex without intravenous contrast. COMPARISON:  07/13/2017 FINDINGS: Brain: Normal ventricular morphology. No midline shift or mass effect. Age-indeterminate LEFT thalamic infarct new since 2018, favor at least subacute. No intracranial hemorrhage, mass lesion,  or fluid collections. Additional infarction. No extra-axial Vascular: No hyperdense vessels. Atherosclerotic calcifications of internal carotid arteries at skull base Skull: Intact Sinuses/Orbits: Clear Other: N/A IMPRESSION: Age-indeterminate LEFT thalamic infarct new since 2018, favor at least subacute in age. No other intracranial abnormalities. Electronically Signed   By: Lavonia Dana M.D.   On: 09/15/2021 17:27   MR BRAIN WO CONTRAST  Result Date: 09/16/2021 CLINICAL DATA:  60 year old female with confusion. Abnormal mental status. Age indeterminate left thalamic infarct on head CT yesterday. EXAM: MRI HEAD WITHOUT CONTRAST TECHNIQUE: Multiplanar, multiecho pulse sequences of the brain and surrounding structures were obtained without intravenous contrast. COMPARISON:  Head CT 09/15/2021, 07/13/2017. FINDINGS: Brain: Oval 14 mm area of intense abnormal trace diffusion signal in the left thalamus corresponding to the CT finding. This appears heterogeneously restricted on ADC. Confluent associated T2 and FLAIR hyperintensity with decreased T1 signal. SWI indicates associated microhemorrhage, but there is no malignant hemorrhagic transformation or significant mass effect. No other restricted diffusion. No midline shift, mass  effect, evidence of mass lesion, ventriculomegaly, extra-axial collection or acute intracranial hemorrhage. Cervicomedullary junction and pituitary are within normal limits. Outside of the left thalamus Pearline Cables and white matter signal is within normal limits for age throughout the brain. No cortical encephalomalacia. No other cerebral blood products identified on SWI. Basal ganglia, brainstem and cerebellum are within normal limits. Vascular: Major intracranial vascular flow voids are preserved. Skull and upper cervical spine: Negative visible cervical spine. Visualized bone marrow signal is within normal limits. Sinuses/Orbits: Negative orbits. Mild bilateral paranasal sinus mucosal  thickening. Other: Mastoids are well aerated. Grossly normal visible internal auditory structures. Negative visible scalp and face. IMPRESSION: 1. Subacute 14 mm left thalamic lacunar infarct with petechial hemorrhage, but no malignant hemorrhagic transformation or significant mass effect. 2. No other acute intracranial abnormality, elsewhere normal for age noncontrast MRI appearance of the brain. Electronically Signed   By: Genevie Ann M.D.   On: 09/16/2021 04:13   DG Chest Portable 1 View  Result Date: 09/15/2021 CLINICAL DATA:  Altered mental status, confusion EXAM: PORTABLE CHEST 1 VIEW COMPARISON:  Portable exam 1713 hours compared to 07/24/2018 FINDINGS: Borderline enlargement of cardiac silhouette post median sternotomy. Mediastinal contours and pulmonary vascularity normal. Lungs clear. No infiltrate, pleural effusion or pneumothorax. IMPRESSION: No acute abnormalities. Electronically Signed   By: Lavonia Dana M.D.   On: 09/15/2021 17:28   ECHOCARDIOGRAM COMPLETE BUBBLE STUDY  Result Date: 09/16/2021    ECHOCARDIOGRAM REPORT   Patient Name:   HARLY BANDSTRA Date of Exam: 09/16/2021 Medical Rec #:  [id number]         Height:       64.0 in Accession #:    [id number]        Weight:       280.0 lb Date of Birth:  17-Jul-1961         BSA:          2.257 m Patient Age:    37 years          BP:           104/74 mmHg Patient Gender: F                 HR:           63 bpm. Exam Location:  Inpatient Procedure: 2D Echo and Saline Contrast Bubble Study Indications:    stroke  History:        Patient has prior history of Echocardiogram examinations, most                 recent 07/17/2018. CHF, CAD, Prior CABG; Risk                 Factors:Hypertension, Dyslipidemia and Diabetes.  Sonographer:    Johny Chess RDCS Referring Phys: [id number] [place]  1. Left ventricular ejection fraction, by estimation, is 60 to 65%. The left ventricle has normal function. The left ventricle has no regional  wall motion abnormalities. Left ventricular diastolic parameters are consistent with Grade II diastolic dysfunction (pseudonormalization). Elevated left atrial pressure.  2. Right ventricular systolic function is normal. The right ventricular size is normal. Tricuspid regurgitation signal is inadequate for assessing PA pressure.  3. Left atrial size was mildly dilated.  4. The mitral valve is normal in structure. No evidence of mitral valve regurgitation.  5. The aortic valve is tricuspid. There is mild calcification of the aortic valve. There is mild thickening of the aortic valve. Aortic valve regurgitation  is not visualized. Aortic valve sclerosis is present, with no evidence of aortic valve stenosis.  6. Agitated saline contrast bubble study was negative, with no evidence of any interatrial shunt. FINDINGS  Left Ventricle: Left ventricular ejection fraction, by estimation, is 60 to 65%. The left ventricle has normal function. The left ventricle has no regional wall motion abnormalities. The left ventricular internal cavity size was normal in size. There is  borderline concentric left ventricular hypertrophy. Left ventricular diastolic parameters are consistent with Grade II diastolic dysfunction (pseudonormalization). Elevated left atrial pressure. Right Ventricle: The right ventricular size is normal. No increase in right ventricular wall thickness. Right ventricular systolic function is normal. Tricuspid regurgitation signal is inadequate for assessing PA pressure. Left Atrium: Left atrial size was mildly dilated. Right Atrium: Right atrial size was normal in size. Pericardium: There is no evidence of pericardial effusion. Mitral Valve: The mitral valve is normal in structure. Mild mitral annular calcification. No evidence of mitral valve regurgitation. Tricuspid Valve: The tricuspid valve is normal in structure. Tricuspid valve regurgitation is not demonstrated. Aortic Valve: The aortic valve is tricuspid.  There is mild calcification of the aortic valve. There is mild thickening of the aortic valve. Aortic valve regurgitation is not visualized. Aortic valve sclerosis is present, with no evidence of aortic valve stenosis. Pulmonic Valve: The pulmonic valve was not well visualized. Pulmonic valve regurgitation is not visualized. Aorta: The aortic root and ascending aorta are structurally normal, with no evidence of dilitation. IAS/Shunts: No atrial level shunt detected by color flow Doppler. Agitated saline contrast was given intravenously to evaluate for intracardiac shunting. Agitated saline contrast bubble study was negative, with no evidence of any interatrial shunt.  LEFT VENTRICLE PLAX 2D LVIDd:         4.70 cm   Diastology LVIDs:         3.10 cm   LV e' medial:    5.00 cm/s LV PW:         1.10 cm   LV E/e' medial:  18.0 LV IVS:        1.20 cm   LV e' lateral:   5.50 cm/s LVOT diam:     1.80 cm   LV E/e' lateral: 16.4 LV SV:         57 LV SV Index:   25 LVOT Area:     2.54 cm  RIGHT VENTRICLE             IVC RV S prime:     10.00 cm/s  IVC diam: 1.50 cm TAPSE (M-mode): 2.1 cm LEFT ATRIUM             Index        RIGHT ATRIUM           Index LA diam:        4.20 cm 1.86 cm/m   RA Area:     16.90 cm LA Vol (A2C):   51.6 ml 22.86 ml/m  RA Volume:   46.00 ml  20.38 ml/m LA Vol (A4C):   59.9 ml 26.54 ml/m LA Biplane Vol: 56.6 ml 25.08 ml/m  AORTIC VALVE LVOT Vmax:   95.60 cm/s LVOT Vmean:  66.600 cm/s LVOT VTI:    0.224 m  AORTA Ao Root diam: 2.60 cm Ao Asc diam:  3.30 cm MITRAL VALVE MV Area (PHT): 3.48 cm    SHUNTS MV Decel Time: 218 msec    Systemic VTI:  0.22 m MV E velocity: 90.00 cm/s  Systemic  Diam: 1.80 cm MV A velocity: 72.40 cm/s MV E/A ratio:  1.24 Mihai Croitoru MD Electronically signed by Sanda Klein MD Signature Date/Time: 09/16/2021/1:08:26 PM    Final         Scheduled Meds:  aspirin EC  81 mg Oral Daily   busPIRone  30 mg Oral TID   clopidogrel  75 mg Oral Daily   DULoxetine  60 mg  Oral BID   ezetimibe  10 mg Oral Daily   heparin  5,000 Units Subcutaneous Q8H   insulin aspart  0-15 Units Subcutaneous TID AC & HS   insulin aspart protamine- aspart  90 Units Subcutaneous BID WC   levothyroxine  50 mcg Oral QAC breakfast   risperiDONE  2 mg Oral BID   rosuvastatin  40 mg Oral Daily   traZODone  200 mg Oral QHS   Continuous Infusions:   LOS: 1 day   Time spent= 35 mins    Blossom Crume Arsenio Loader, MD [facility]  If 7PM-7AM, please contact night-coverage  09/17/2021, 11:23 AM

## 2021-09-17 NOTE — TOC Initial Note (Signed)
Transition of Care [facility]) - Initial/Assessment Note    Patient Details  Name: [name] MRN: [id number] Date of Birth: [birth year]  Transition of Care [facility]) CM/SW Contact:    Pollie Friar, RN Phone Number: 09/17/2021, 2:29 PM  Clinical Narrative:                 CM met with the patient and she if very sleepy. She did state CM could contact her daughter. CM called and spoke to [place] over the phone. [name] states that she and her sister are not able to provide the supervision and support the patient currently needs as they both work.  [name] is interested in SNF rehab for her mother. She asked that she be faxed out in the [place] areas.  PASAR currently pending and CM will send in the needed information. TOC following and will provide bed offers to pts daughter in the am.   Expected Discharge Plan: Skilled Nursing Facility Barriers to Discharge: Continued Medical Work up   Patient Goals and CMS Choice   CMS Medicare.gov Compare Post Acute Care list provided to:: Patient Represenative (must comment) Choice offered to / list presented to : Adult Children  Expected Discharge Plan and Services Expected Discharge Plan: [place] In-house Referral: Clinical Social Work Discharge Planning Services: CM Consult Post Acute Care Choice: [place] arrangements for the past 2 months: [place]                                      Prior Living Arrangements/Services Living arrangements for the past 2 months: Single Family Home Lives with:: Self Patient language and need for interpreter reviewed:: Yes Do you feel safe going back to the place where you live?: Yes      Need for Family Participation in Patient Care: Yes (Comment) Care giver support system in place?: No (comment)   Criminal Activity/Legal Involvement Pertinent to Current Situation/Hospitalization: No - Comment as needed  Activities of Daily  Living      Permission Sought/Granted                  Emotional Assessment Appearance:: Appears stated age Attitude/Demeanor/Rapport:  (lethargic) Affect (typically observed):  (lethargic) Orientation: : Oriented to Self, Oriented to Place   Psych Involvement: No (comment)  Admission diagnosis:  Aphasia [R47.01] Stroke ([place]) [I63.9] Altered mental status, unspecified altered mental status type [R41.82] 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[place]) [I63.81] Patient Active Problem List   Diagnosis Date Noted   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   Mixed diabetic hyperlipidemia associated with type 2 diabetes mellitus ([place]) 09/16/2021   Chronic diastolic CHF (congestive heart failure) ([place]) 09/16/2021   Coronary artery disease involving native coronary artery of native heart without angina pectoris 09/16/2021   Vitamin D deficiency 02/14/2019   Mixed hyperlipidemia 01/30/2019   NSTEMI (non-ST elevated myocardial infarction) ([place]) 07/24/2018   Type 2 diabetes mellitus with hyperglycemia, with long-term current use of insulin ([place]) 07/18/2018   Hypothyroidism 07/18/2018   Chest pain 07/17/2018   Bipolar I disorder, current or most recent episode manic, with psychotic features with mixed features ([place]) 07/04/2018   Bipolar disorder, unspecified ([place]) 02/28/2012   Bipolar 1 disorder, depressed ([place]) 12/27/2011   OBESITY, MORBID 07/07/2009   OTHER AND UNSPECIFIED BIPOLAR DISORDERS 07/07/2009   Essential hypertension 07/07/2009   ASTHMA 07/07/2009   CHEST PAIN 07/07/2009   PCP:  Christain [place], MD Pharmacy:   [facility], [address] [address] Phone: [phone] Fax: [phone]  [facility] [place], [address] AT [place] [place] [address] Phone: [phone] Fax: [phone]  [facility] [phone] - [place], [place] AT [place] [address] [place] [address] Phone: [phone] Fax: [phone]  [place] [address] [place] [address] Phone: [phone] Fax: [phone]  [facility] [place], [address] [place] [address] Phone: [phone] Fax: [phone]     Social Determinants of Health (SDOH) Interventions    Readmission Risk Interventions No flowsheet data found.

## 2021-09-17 NOTE — Plan of Care (Signed)
?  Problem: Education: ?Goal: Knowledge of disease or condition will improve ?Outcome: Progressing ?Goal: Knowledge of secondary prevention will improve (SELECT ALL) ?Outcome: Progressing ?Goal: Knowledge of patient specific risk factors will improve (INDIVIDUALIZE FOR PATIENT) ?Outcome: Progressing ?  ?

## 2021-09-17 NOTE — Evaluation (Signed)
Physical Therapy Evaluation Patient Details Name: [name] MRN: [id number] DOB: [birth year] Today's Date: 09/17/2021  History of Present Illness  Pt is a 60 y.o. female who presented 09/15/21 with AMS and difficulty speaking. Outisde window for [employer]. Imaging revealed subacute 14 mm left thalamic lacunar infarct with petechial hemorrhage, but no malignant hemorrhagic transformation or significant mass effect. 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 PMH: DM2, HTN, HLD, hypothyroidism, Rheumatoid arthritis, diastolic congestive heart failure ([place] 06/2018 EF 65-70% with G2DD), bipolar disorder, obesity   Clinical Impression  Pt presents with condition above and deficits mentioned below, see PT Problem List. PTA, she was living alone in a 1-level house with 3-4 STE, performing all functional mobility independently without an AD. She does report a hx of falls. Currently, is functioning at a min guard assist level, with no significant difference or weakness noted in either lower extremity. Pt displays deficits in cognition, activity tolerance, and balance that place her at risk for falls and injuries. Pt displays deficits in STM, processing speed, and awareness and demonstrates a very difficulty time pathfinding her way back to her room, even though it was a short distance of < 50 ft and thus required max cues to complete. She would be unsafe to be home alone. If she can obtain 24/7 assistance at d/c, then recommend pt go home with HHPT follow-up. However, if she does not have the level of care necessary for safety purposes then she will likely need to go to a SNF for short-term rehab. Will continue to follow acutely.     Recommendations for follow up therapy are one component of a multi-disciplinary discharge planning process, led by the attending physician.  Recommendations may be updated based on patient status, additional functional criteria and insurance  authorization.  Follow Up Recommendations Skilled nursing-short term rehab (<3 hours/day) (HHPT if have 24/7 assistance available)    Assistance Recommended at Discharge Frequent or constant Supervision/Assistance  Functional Status Assessment Patient has had a recent decline in their functional status and demonstrates the ability to make significant improvements in function in a reasonable and predictable amount of time.  Equipment Recommendations  None recommended by PT    Recommendations for Other Services       Precautions / Restrictions Precautions Precautions: Fall Restrictions Weight Bearing Restrictions: No      Mobility  Bed Mobility Overal bed mobility: Needs Assistance Bed Mobility: Supine to Sit     Supine to sit: Min guard     General bed mobility comments: Min guard for safety, extra time    Transfers Overall transfer level: Needs assistance Equipment used: None;Rolling walker (2 wheels) Transfers: Sit to/from Stand Sit to Stand: Min guard           General transfer comment: Min G for safety coming to stand from EOB to RW    Ambulation/Gait Ambulation/Gait assistance: Min guard;+2 safety/equipment Gait Distance (Feet): 120 Feet Assistive device: Rolling walker (2 wheels);None Gait Pattern/deviations: Step-through pattern;Decreased stride length;Shuffle Gait velocity: reduced Gait velocity interpretation: <1.31 ft/sec, indicative of household ambulator   General Gait Details: Pt with very slow gait and mild lateral trunk sway, but no LOB. Pt with fialry symmetrical lower extremity step length bil. Min guard for safety.  Stairs            Wheelchair Mobility    Modified Rankin (Stroke Patients Only) Modified Rankin (Stroke Patients Only) Pre-Morbid Rankin Score: No symptoms Modified Rankin: Moderately severe disability  Balance Overall balance assessment: Mild deficits observed, not formally tested                                            Pertinent Vitals/Pain Pain Assessment: No/denies pain    Home Living Family/patient expects to be discharged to:: Private residence Living Arrangements: Alone Available Help at Discharge: Family;Available PRN/intermittently (daughter lives a mile away) Type of Home: House Home Access: Stairs to enter Entrance Stairs-Rails: Left (ascending) Entrance Stairs-Number of Steps: 3-4   Home Layout: One level Home Equipment: Agricultural consultant (2 wheels);Cane - quad;BSC/3in1      Prior Function Prior Level of Function : History of Falls (last six months);Independent/Modified Independent             Mobility Comments: Does not use AD ADLs Comments: Does not drive     Hand Dominance   Dominant Hand: Left    Extremity/Trunk Assessment   Upper Extremity Assessment Upper Extremity Assessment: Defer to OT evaluation RUE Deficits / Details: Unable to AROM past 80 degrees shoulder flexion or abduction, MMT in all areas4-/5. RUE Sensation: WNL RUE Coordination: decreased fine motor;decreased gross motor LUE Deficits / Details: Unable to AROM past 80 degrees shoulder flexion or abduction, MMT in all areas4-/5. LUE Sensation: WNL LUE Coordination: decreased fine motor;decreased gross motor    Lower Extremity Assessment Lower Extremity Assessment: RLE deficits/detail RLE Deficits / Details: Pt reporting increased weakness in R side compared to L but both had MMT scores of 4+ hip flexion, 5 knee extension, 4- knee flexion, 4+ ankle dorsiflexion with no significant asymmetry noted; pt reports hx of peripheral neuropathy bil, one moment stating she felt less on her R then noted feeling the same bil RLE Sensation: decreased light touch;history of peripheral neuropathy RLE Coordination: decreased gross motor    Cervical / Trunk Assessment Cervical / Trunk Assessment: Kyphotic  Communication   Communication: Expressive difficulties  Cognition Arousal/Alertness:  Awake/alert Behavior During Therapy: Flat affect Overall Cognitive Status: Impaired/Different from baseline Area of Impairment: Orientation;Memory;Following commands;Safety/judgement;Problem solving                 Orientation Level: Disoriented to;Time   Memory: Decreased short-term memory Following Commands: Follows one step commands with increased time;Follows multi-step commands inconsistently Safety/Judgement: Decreased awareness of safety;Decreased awareness of deficits   Problem Solving: Slow processing;Difficulty sequencing;Requires verbal cues;Requires tactile cues General Comments: Pt not aware of what today is, did not recall that it was Christmas this weekend, when asked to request items at nurses station, pt unable to recall items after 2 mins. requiring increased assist for sequencing, initiating, and safety. Unable to retrack route back to room, even though only went short distance. Needed max cues and reminders of pt's room number for pathfinding.        General Comments General comments (skin integrity, edema, etc.): educated pt on need for 24/7 care due to cognitive deficits    Exercises     Assessment/Plan    PT Assessment Patient needs continued PT services  PT Problem List Decreased activity tolerance;Decreased balance;Decreased mobility;Decreased cognition;Decreased safety awareness;Impaired sensation;Obesity;Decreased coordination       PT Treatment Interventions DME instruction;Gait training;Stair training;Functional mobility training;Therapeutic activities;Therapeutic exercise;Balance training;Cognitive remediation;Neuromuscular re-education;Patient/family education    PT Goals (Current goals can be found in the Care Plan section)  Acute Rehab PT Goals Patient Stated Goal: to get better PT Goal Formulation:  With patient Time For Goal Achievement: 10/01/21 Potential to Achieve Goals: Good    Frequency Min 4X/week   Barriers to discharge  Decreased caregiver support pt needs constant care at this time and reports only PRN available    Co-evaluation PT/OT/SLP Co-Evaluation/Treatment: Yes Reason for Co-Treatment: For patient/therapist safety;To address functional/ADL transfers PT goals addressed during session: Mobility/safety with mobility;Balance;Proper use of DME OT goals addressed during session: ADL's and self-care;Proper use of Adaptive equipment and DME       AM-PAC PT "6 Clicks" Mobility  Outcome Measure Help needed turning from your back to your side while in a flat bed without using bedrails?: A Little Help needed moving from lying on your back to sitting on the side of a flat bed without using bedrails?: A Little Help needed moving to and from a bed to a chair (including a wheelchair)?: A Little Help needed standing up from a chair using your arms (e.g., wheelchair or bedside chair)?: A Little Help needed to walk in hospital room?: A Little Help needed climbing 3-5 steps with a railing? : A Little 6 Click Score: 18    End of Session Equipment Utilized During Treatment: Gait belt Activity Tolerance: Patient tolerated treatment well Patient left: in chair;with call bell/phone within reach;with chair alarm set Nurse Communication: Mobility status PT Visit Diagnosis: Unsteadiness on feet (R26.81);Other abnormalities of gait and mobility (R26.89);History of falling (Z91.81);Difficulty in walking, not elsewhere classified (R26.2);Other symptoms and signs involving the nervous system (R29.898)    Time: 1027-2536 PT Time Calculation (min) (ACUTE ONLY): 27 min   Charges:   PT Evaluation $PT Eval Moderate Complexity: 1 Mod          Raymond Gurney, PT, DPT Acute Rehabilitation Services  Pager: [phone] Office: [phone]   Jewel Baize 09/17/2021, 2:48 PM

## 2021-09-17 NOTE — Progress Notes (Signed)
Re: [name] DOB: January 24, 1961 Date: 09/17/2021   To Whom It May Concern:  Please be advised that the above-named patient will require a short-term nursing home stay--anticipated 30 days or less for rehabilitation and strengthening. The plan is for home.

## 2021-09-17 NOTE — Significant Event (Signed)
BGL 42, RN following hypoglycemic protocol.  Will reduce 70-30 dosing from 90u BID to 70u BID with tomorrow AM dose.

## 2021-09-17 NOTE — Progress Notes (Signed)
Inpatient Diabetes Program Recommendations  AACE/ADA: New Consensus Statement on Inpatient Glycemic Control (2015)  Target Ranges:  Prepandial:   less than 140 mg/dL      Peak postprandial:   less than 180 mg/dL (1-2 hours)      Critically ill patients:  140 - 180 mg/dL   Lab Results  Component Value Date   GLUCAP 88 09/17/2021   HGBA1C 8.9 (H) 09/16/2021    Review of Glycemic Control  Diabetes history: DM 2 Outpatient Diabetes medications:  Glucotrol 10 mg bid Novolin 70/30 90 units bid Current orders for Inpatient glycemic control:  Novolog 0-15 units tid with meals and HS Novolog 70/30 mix 90 units bid  Inpatient Diabetes Program Recommendations:   Noted 70/30 insulin mix given and patient is currently NPO. Anticipate hypoglycemia. Recheck CBG in 1 hr. D/C 70/30 insulin while NPO and reduce dose when restarted.  Thank you, [name]. Abdurahman Rugg, RN, MSN, CDE  Diabetes Coordinator Inpatient Glycemic Control Team Team Pager [phone] (8am-5pm) 09/17/2021 10:43 AM

## 2021-09-17 NOTE — Evaluation (Signed)
Occupational Therapy Evaluation Patient Details Name: [name] MRN: [id number] DOB: 12-15-1960 Today's Date: 09/17/2021   History of Present Illness Pt is a 60 y.o. female who presented 09/15/21 with AMS and difficulty speaking. Outisde window for [employer]. Imaging revealed subacute 14 mm left thalamic lacunar infarct with petechial hemorrhage, but no malignant hemorrhagic transformation or significant mass effect. 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 PMH: DM2, HTN, HLD, hypothyroidism, Rheumatoid arthritis, diastolic congestive heart failure (Echo 06/2018 EF 65-70% with G2DD), bipolar disorder, obesity   Clinical Impression   Pt admitted for concerns listed above. PTA pt reported that she was independent with all basic ADL's and functional mobility, using no DME. She reports that her daughter assists with most IADL's, including medication management, cooking, and driving. At this time, pt presents with decreased cognition, weakness, balance concerns, and decreased activity tolerance. Physically, pt requiring min guard for functional mobility and transfers, however is requiring increased assist for ADL's due to cognition and difficulty following commands. Recommending SNF level therapies to maximize independence. Pt can return home if she has 24/7 supervision and assist with all IADL's and min guard assist+ for all ADL's for the next few weeks, along with max HH therapies. OT will follow acutely to address concerns listed below.      Recommendations for follow up therapy are one component of a multi-disciplinary discharge planning process, led by the attending physician.  Recommendations may be updated based on patient status, additional functional criteria and insurance authorization.   Follow Up Recommendations  Skilled nursing-short term rehab (<3 hours/day)    Assistance Recommended at Discharge Frequent or constant Supervision/Assistance  Functional Status  Assessment  Patient has had a recent decline in their functional status and demonstrates the ability to make significant improvements in function in a reasonable and predictable amount of time.  Equipment Recommendations  Other (comment) (TBD.)    Recommendations for Other Services       Precautions / Restrictions Precautions Precautions: Fall Restrictions Weight Bearing Restrictions: No      Mobility Bed Mobility Overal bed mobility: Needs Assistance Bed Mobility: Supine to Sit     Supine to sit: Min guard     General bed mobility comments: Min guard for safety    Transfers Overall transfer level: Needs assistance Equipment used: None;Rolling walker (2 wheels) Transfers: Sit to/from Stand Sit to Stand: Min guard           General transfer comment: Min G for safety      Balance Overall balance assessment: Mild deficits observed, not formally tested                                         ADL either performed or assessed with clinical judgement   ADL Overall ADL's : Needs assistance/impaired Eating/Feeding: Set up;Sitting   Grooming: Set up;Sitting   Upper Body Bathing: Minimal assistance;Cueing for sequencing;Sitting   Lower Body Bathing: Moderate assistance;Sitting/lateral leans;Sit to/from stand;Cueing for sequencing   Upper Body Dressing : Minimal assistance;Cueing for sequencing;Sitting   Lower Body Dressing: Moderate assistance;Cueing for sequencing;Sitting/lateral leans;Sit to/from stand   Toilet Transfer: Min guard;Ambulation   Toileting- Clothing Manipulation and Hygiene: Moderate assistance;Sitting/lateral lean;Sit to/from stand       Functional mobility during ADLs: Min guard General ADL Comments: Pt requiring increased assist due to cognition, requiring multimodal cues throughout session  Vision Baseline Vision/History: 1 Wears glasses Ability to See in Adequate Light: 1 Impaired Patient Visual Report: No change  from baseline Vision Assessment?: No apparent visual deficits Additional Comments: Pt reports blurred vision even with glasses, reports that is her normal. Able to read larger print.     Perception     Praxis      Pertinent Vitals/Pain Pain Assessment: No/denies pain     Hand Dominance Left   Extremity/Trunk Assessment Upper Extremity Assessment Upper Extremity Assessment: Generalized weakness;RUE deficits/detail;LUE deficits/detail RUE Deficits / Details: Unable to AROM past 80 degrees shoulder flexion or abduction, MMT in all areas4-/5. RUE Sensation: WNL RUE Coordination: decreased fine motor;decreased gross motor LUE Deficits / Details: Unable to AROM past 80 degrees shoulder flexion or abduction, MMT in all areas4-/5. LUE Sensation: WNL LUE Coordination: decreased fine motor;decreased gross motor   Lower Extremity Assessment Lower Extremity Assessment: Defer to PT evaluation   Cervical / Trunk Assessment Cervical / Trunk Assessment: Kyphotic   Communication Communication Communication: Expressive difficulties   Cognition Arousal/Alertness: Awake/alert Behavior During Therapy: Flat affect Overall Cognitive Status: Impaired/Different from baseline Area of Impairment: Orientation;Memory;Following commands;Safety/judgement;Problem solving                 Orientation Level: Disoriented to;Time   Memory: Decreased short-term memory Following Commands: Follows one step commands with increased time;Follows multi-step commands inconsistently Safety/Judgement: Decreased awareness of safety;Decreased awareness of deficits   Problem Solving: Slow processing;Difficulty sequencing;Requires verbal cues;Requires tactile cues General Comments: Pt not aware of what today is, did not recall that it was Christmas this weekend, when asked to request items at nurses station, pt unable to recall items after 2 mins. requiring increased assist for sequencing, initiating, and safety.      General Comments  VSS on RA    Exercises     Shoulder Instructions      Home Living Family/patient expects to be discharged to:: Private residence Living Arrangements: Alone Available Help at Discharge: Family;Available PRN/intermittently (daughter lives a mile away) Type of Home: House Home Access: Stairs to enter Secretary/administrator of Steps: 3-4 Entrance Stairs-Rails: Left (ascending) Home Layout: One level     Bathroom Shower/Tub: Chief Strategy Officer: Standard     Home Equipment: Agricultural consultant (2 wheels);Cane - quad;BSC/3in1          Prior Functioning/Environment Prior Level of Function : History of Falls (last six months);Independent/Modified Independent             Mobility Comments: Does not use AD ADLs Comments: Does not drive        OT Problem List: Decreased strength;Decreased activity tolerance;Impaired balance (sitting and/or standing);Decreased coordination;Decreased cognition;Decreased safety awareness;Decreased knowledge of use of DME or AE;Obesity;Impaired UE functional use      OT Treatment/Interventions: Self-care/ADL training;Therapeutic exercise;Energy conservation;DME and/or AE instruction;Therapeutic activities;Cognitive remediation/compensation;Patient/family education;Balance training    OT Goals(Current goals can be found in the care plan section) Acute Rehab OT Goals Patient Stated Goal: None stated OT Goal Formulation: With patient Time For Goal Achievement: 10/01/21 Potential to Achieve Goals: Fair ADL Goals Pt Will Perform Grooming: with modified independence;standing Pt Will Perform Lower Body Bathing: with supervision;sitting/lateral leans;sit to/from stand Pt Will Perform Lower Body Dressing: with supervision;sitting/lateral leans;sit to/from stand Pt Will Transfer to Toilet: with min guard assist;ambulating Pt Will Perform Toileting - Clothing Manipulation and hygiene: with min guard  assist;sitting/lateral leans;sit to/from stand Additional ADL Goal #1: Pt will follow 75% of simple commands with no cuing during each  session. Additional ADL Goal #2: Pt will complete 2-3 step higher cognitive level task.  OT Frequency: Min 2X/week   Barriers to D/C:            Co-evaluation PT/OT/SLP Co-Evaluation/Treatment: Yes Reason for Co-Treatment: For patient/therapist safety;To address functional/ADL transfers PT goals addressed during session: Mobility/safety with mobility;Balance;Proper use of DME OT goals addressed during session: ADL's and self-care;Proper use of Adaptive equipment and DME      AM-PAC OT "6 Clicks" Daily Activity     Outcome Measure Help from another person eating meals?: A Little Help from another person taking care of personal grooming?: A Little Help from another person toileting, which includes using toliet, bedpan, or urinal?: A Lot Help from another person bathing (including washing, rinsing, drying)?: A Lot Help from another person to put on and taking off regular upper body clothing?: A Little Help from another person to put on and taking off regular lower body clothing?: A Lot 6 Click Score: 15   End of Session Equipment Utilized During Treatment: Gait belt;Rolling walker (2 wheels) Nurse Communication: Mobility status  Activity Tolerance: Patient tolerated treatment well Patient left: in chair;with call bell/phone within reach;with chair alarm set  OT Visit Diagnosis: Unsteadiness on feet (R26.81);Other abnormalities of gait and mobility (R26.89);Muscle weakness (generalized) (M62.81);Other symptoms and signs involving cognitive function                Time: 1120-1146 OT Time Calculation (min): 26 min Charges:  OT General Charges $OT Visit: 1 Visit OT Evaluation $OT Eval Moderate Complexity: 1 Mod  Maahi Lannan H., OTR/L Acute Rehabilitation  Cinque Begley Elane Bing Plume 09/17/2021, 1:03 PM

## 2021-09-17 NOTE — Progress Notes (Signed)
Hypoglycemic Event  CBG: 42  Treatment: 8 oz juice/soda  Symptoms: Shaky  Follow-up CBG: Time: 2229 CBG Result:100  Possible Reasons for Event: 8 oz OJ and 8 oz soda  Comments/MD notified: Pt's CBG was retaken per protocol, but did not reach CBG 70/> until 2229. Dr. Linward Foster

## 2021-09-17 NOTE — NC FL2 (Signed)
[place] MEDICAID FL2 LEVEL OF CARE SCREENING TOOL     IDENTIFICATION  Patient Name: [name] Birthdate: 06/07/1961 Sex: female Admission Date (Current Location): 09/15/2021  [facility] and [place] Number:  [employer] and Address:  [facility] [place], [address] [place], [place] [address]      Provider Number: [id number]  Attending Physician Name and Address:  Dimple Nanas, MD  Relative Name and Phone Number:       Current Level of Care: Hospital Recommended Level of Care: Skilled Nursing Facility Prior Approval Number:    Date Approved/Denied:   PASRR Number: manual review  Discharge Plan: SNF    Current Diagnoses: Patient Active Problem List   Diagnosis Date Noted   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HCC) 09/16/2021   Mixed diabetic hyperlipidemia associated with type 2 diabetes mellitus (HCC) 09/16/2021   Chronic diastolic CHF (congestive heart failure) (HCC) 09/16/2021   Coronary artery disease involving native coronary artery of native heart without angina pectoris 09/16/2021   Vitamin D deficiency 02/14/2019   Mixed hyperlipidemia 01/30/2019   NSTEMI (non-ST elevated myocardial infarction) (HCC) 07/24/2018   Type 2 diabetes mellitus with hyperglycemia, with long-term current use of insulin (HCC) 07/18/2018   Hypothyroidism 07/18/2018   Chest pain 07/17/2018   Bipolar I disorder, current or most recent episode manic, with psychotic features with mixed features (HCC) 07/04/2018   Bipolar disorder, unspecified (HCC) 02/28/2012   Bipolar 1 disorder, depressed (HCC) 12/27/2011   OBESITY, MORBID 07/07/2009   OTHER AND UNSPECIFIED BIPOLAR DISORDERS 07/07/2009   Essential hypertension 07/07/2009   ASTHMA 07/07/2009   CHEST PAIN 07/07/2009    Orientation RESPIRATION BLADDER Height & Weight     Self, Place  Normal Incontinent Weight: 127 kg Height:  5\' 4"  (162.6 cm)  BEHAVIORAL SYMPTOMS/MOOD NEUROLOGICAL BOWEL NUTRITION STATUS       Continent Diet (heart healthy/ carb modified with thin liquids)  AMBULATORY STATUS COMMUNICATION OF NEEDS Skin   Limited Assist Verbally Normal                       Personal Care Assistance Level of Assistance  Bathing, Feeding, Dressing Bathing Assistance: Limited assistance Feeding assistance: Independent Dressing Assistance: Limited assistance     Functional Limitations Info  Sight, Hearing, Speech Sight Info: Adequate Hearing Info: Adequate Speech Info: Adequate    SPECIAL CARE FACTORS FREQUENCY  PT (By licensed PT), OT (By licensed OT), Speech therapy     PT Frequency: 5x/wk OT Frequency: 5x/wk     Speech Therapy Frequency: 5x/wk      Contractures Contractures Info: Not present    Additional Factors Info  Code Status, Allergies, Insulin Sliding Scale, Psychotropic Code Status Info: Full Allergies Info: Amoxicillin/ doxycycline/ levofloxacin/ propoxyphene and acetaminophen Psychotropic Info: Cymbalta DR 60 mg BID/ Buspar 30 mg TID/ Risperdal 2 mg BID/ Trazadone 200 mg at bedtime Insulin Sliding Scale Info: novolog 0-15 units SQ TID/ Novolin 70/30 90 units SQ BID       Current Medications (09/17/2021):  This is the current hospital active medication list Current Facility-Administered Medications  Medication Dose Route Frequency Provider Last Rate Last Admin   acetaminophen (TYLENOL) tablet 650 mg  650 mg Oral Q6H PRN 09/19/2021, MD   650 mg at 09/16/21 1131   Or   acetaminophen (TYLENOL) suppository 650 mg  650 mg Rectal Q6H PRN Shalhoub, 09/18/21, MD       aspirin EC tablet 81 mg  81 mg Oral Daily Marinda Elk, MD   81 mg at 09/17/21 0947   busPIRone (BUSPAR) tablet 30 mg  30 mg Oral TID Marinda Elk, MD   30 mg at 09/17/21 0947   clopidogrel (PLAVIX) tablet 75 mg  75 mg Oral Daily Amin, Ankit Chirag, MD   75 mg at 09/17/21 0947   DULoxetine (CYMBALTA) DR capsule 60 mg  60 mg Oral BID Marinda Elk, MD   60 mg at 09/17/21 0947    ezetimibe (ZETIA) tablet 10 mg  10 mg Oral Daily Amin, Ankit Chirag, MD   10 mg at 09/17/21 0947   heparin injection 5,000 Units  5,000 Units Subcutaneous Q8H Shalhoub, Deno Lunger, MD   5,000 Units at 09/17/21 1354   hydrALAZINE (APRESOLINE) injection 10 mg  10 mg Intravenous Q6H PRN Shalhoub, Deno Lunger, MD       hydrOXYzine (ATARAX) tablet 25 mg  25 mg Oral TID PRN Marinda Elk, MD   25 mg at 09/16/21 0807   insulin aspart (novoLOG) injection 0-15 Units  0-15 Units Subcutaneous TID AC & HS Shalhoub, Deno Lunger, MD   2 Units at 09/17/21 1227   insulin aspart protamine- aspart (NOVOLOG MIX 70/30) injection 90 Units  90 Units Subcutaneous BID WC Marinda Elk, MD   90 Units at 09/17/21 0946   ipratropium-albuterol (DUONEB) 0.5-2.5 (3) MG/3ML nebulizer solution 3 mL  3 mL Nebulization Q4H PRN Amin, Loura Halt, MD       levothyroxine (SYNTHROID) tablet 50 mcg  50 mcg Oral QAC breakfast Marinda Elk, MD   50 mcg at 09/17/21 0946   metoprolol tartrate (LOPRESSOR) injection 5 mg  5 mg Intravenous Q4H PRN Amin, Ankit Chirag, MD       nitroGLYCERIN (NITROSTAT) SL tablet 0.4 mg  0.4 mg Sublingual Q5 min PRN Shalhoub, Deno Lunger, MD       ondansetron [facility]) tablet 4 mg  4 mg Oral Q6H PRN Shalhoub, Deno Lunger, MD       Or   ondansetron (ZOFRAN) injection 4 mg  4 mg Intravenous Q6H PRN Shalhoub, Deno Lunger, MD       polyethylene glycol (MIRALAX / GLYCOLAX) packet 17 g  17 g Oral Daily PRN Shalhoub, Deno Lunger, MD       risperiDONE (RISPERDAL) tablet 2 mg  2 mg Oral BID Marinda Elk, MD   2 mg at 09/17/21 0947   rosuvastatin (CRESTOR) tablet 40 mg  40 mg Oral Daily Amin, Ankit Chirag, MD   40 mg at 09/17/21 0946   senna-docusate (Senokot-S) tablet 1 tablet  1 tablet Oral QHS PRN Dimple Nanas, MD       traZODone (DESYREL) tablet 200 mg  200 mg Oral QHS Shalhoub, Deno Lunger, MD   200 mg at 09/16/21 2228     Discharge Medications: Please see discharge summary for a list of discharge  medications.  Relevant Imaging Results:  Relevant Lab Results:   Additional Information SS#: [id number]  Kermit Balo, RN

## 2021-09-18 DIAGNOSIS — R4701 Aphasia: Secondary | ICD-10-CM | POA: Diagnosis not present

## 2021-09-18 DIAGNOSIS — R4182 Altered mental status, unspecified: Secondary | ICD-10-CM

## 2021-09-18 DIAGNOSIS — I6381 Other cerebral infarction due to occlusion or stenosis of small artery: Secondary | ICD-10-CM | POA: Diagnosis not present

## 2021-09-18 LAB — CBC
HCT: 36.8 % (ref 36.0–46.0)
Hemoglobin: 12.4 g/dL (ref 12.0–15.0)
MCH: 28.1 pg (ref 26.0–34.0)
MCHC: 33.7 g/dL (ref 30.0–36.0)
MCV: 83.3 fL (ref 80.0–100.0)
Platelets: 246 10*3/uL (ref 150–400)
RBC: 4.42 MIL/uL (ref 3.87–5.11)
RDW: 12.6 % (ref 11.5–15.5)
WBC: 6.7 10*3/uL (ref 4.0–10.5)
nRBC: 0 % (ref 0.0–0.2)

## 2021-09-18 LAB — BASIC METABOLIC PANEL
Anion gap: 7 (ref 5–15)
BUN: 30 mg/dL — ABNORMAL HIGH (ref 8–23)
CO2: 26 mmol/L (ref 22–32)
Calcium: 9.1 mg/dL (ref 8.9–10.3)
Chloride: 104 mmol/L (ref 98–111)
Creatinine, Ser: 1.47 mg/dL — ABNORMAL HIGH (ref 0.44–1.00)
GFR, Estimated: 40 mL/min — ABNORMAL LOW (ref >60)
Glucose, Bld: 95 mg/dL (ref 70–99)
Potassium: 4.2 mmol/L (ref 3.5–5.1)
Sodium: 137 mmol/L (ref 135–145)

## 2021-09-18 LAB — GLUCOSE, CAPILLARY
Glucose-Capillary: 114 mg/dL — ABNORMAL HIGH (ref 70–99)
Glucose-Capillary: 158 mg/dL — ABNORMAL HIGH (ref 70–99)
Glucose-Capillary: 179 mg/dL — ABNORMAL HIGH (ref 70–99)
Glucose-Capillary: 208 mg/dL — ABNORMAL HIGH (ref 70–99)
Glucose-Capillary: 56 mg/dL — ABNORMAL LOW (ref 70–99)
Glucose-Capillary: 58 mg/dL — ABNORMAL LOW (ref 70–99)
Glucose-Capillary: 58 mg/dL — ABNORMAL LOW (ref 70–99)
Glucose-Capillary: 77 mg/dL (ref 70–99)

## 2021-09-18 LAB — MAGNESIUM: Magnesium: 1.9 mg/dL (ref 1.7–2.4)

## 2021-09-18 MED ORDER — INSULIN ASPART PROT & ASPART (70-30 MIX) 100 UNIT/ML ~~LOC~~ SUSP
35.0000 [IU] | Freq: Two times a day (BID) | SUBCUTANEOUS | Status: DC
Start: 2021-09-18 — End: 2021-09-19
  Administered 2021-09-18: 18:00:00 35 [IU] via SUBCUTANEOUS

## 2021-09-18 MED ORDER — INSULIN ASPART PROT & ASPART (70-30 MIX) 100 UNIT/ML ~~LOC~~ SUSP
70.0000 [IU] | Freq: Two times a day (BID) | SUBCUTANEOUS | Status: DC
Start: 2021-09-18 — End: 2021-09-18
  Filled 2021-09-18: qty 10

## 2021-09-18 NOTE — Progress Notes (Signed)
Hypoglycemic Event  CBG: 58  Treatment: 8 oz juice/soda  Symptoms: Pale and Shaky  Follow-up CBG: Time: 0708 CBG Result:77  Possible Reasons for Event: Medication regimen:    Comments/MD notified:Dr. Linward Foster

## 2021-09-18 NOTE — TOC Progression Note (Addendum)
Transition of Care [facility]) - Progression Note    Patient Details  Name: STARLYN DROGE MRN: [id number] Date of Birth: [birth year]  [facility] CM/SW Contact  Kermit Balo, RN Phone Number: 09/18/2021, 2:04 PM  Clinical Narrative:    CM has provided the daughter with bed offers for SNF. She will contact CM when she has made a decision.  Pts PASAR has gone to manual review. CM submitted it yesterday at 2:36 pm but not sent to a reviewer until 7 pm. PASAR currently closed for the holiday.  Pt will also need insurance auth for admission to SNF. TOC following.  1700: Daughter updated that she prefers [facility] as her first choice and [address] as her second.   Expected Discharge Plan: Skilled Nursing Facility Barriers to Discharge: Continued Medical Work up  Expected Discharge Plan and Services Expected Discharge Plan: Skilled Nursing Facility In-house Referral: Clinical Social Work Discharge Planning Services: CM Consult Post Acute Care Choice: Skilled Nursing Facility Living arrangements for the past 2 months: Single Family Home                                       Social Determinants of Health (SDOH) Interventions    Readmission Risk Interventions No flowsheet data found.

## 2021-09-18 NOTE — Progress Notes (Signed)
Physical Therapy Treatment Patient Details Name: [name] MRN: [id number] DOB: [birth year] Today's Date: 09/18/2021   History of Present Illness Pt is a 60 y.o. female who presented 09/15/21 with AMS and difficulty speaking. Outisde window for [employer]. Imaging revealed subacute 14 mm left thalamic lacunar infarct with petechial hemorrhage, but no malignant hemorrhagic transformation or significant mass effect. 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 PMH: DM2, HTN, HLD, hypothyroidism, Rheumatoid arthritis, diastolic congestive heart failure (Echo 06/2018 EF 65-70% with G2DD), bipolar disorder, obesity    PT Comments    Pt making steady progress with functional mobility. She is now agreeable to SNF. Daughter was present and engaged throughout session. Pt would continue to benefit from skilled physical therapy services at this time while admitted and after d/c to address the below listed limitations in order to improve overall safety and independence with functional mobility.    Recommendations for follow up therapy are one component of a multi-disciplinary discharge planning process, led by the attending physician.  Recommendations may be updated based on patient status, additional functional criteria and insurance authorization.  Follow Up Recommendations  Skilled nursing-short term rehab (<3 hours/day)     Assistance Recommended at Discharge Frequent or constant Supervision/Assistance  Equipment Recommendations  None recommended by PT    Recommendations for Other Services       Precautions / Restrictions Precautions Precautions: Fall Restrictions Weight Bearing Restrictions: No     Mobility  Bed Mobility Overal bed mobility: Needs Assistance Bed Mobility: Supine to Sit     Supine to sit: Supervision     General bed mobility comments: supervision for safety, no physical assistance required    Transfers Overall transfer level: Needs  assistance Equipment used: Rolling walker (2 wheels) Transfers: Sit to/from Stand Sit to Stand: Min guard           General transfer comment: pt steady with transitional movement    Ambulation/Gait Ambulation/Gait assistance: Min guard Gait Distance (Feet): 150 Feet Assistive device: Rolling walker (2 wheels) Gait Pattern/deviations: Step-through pattern;Decreased stride length Gait velocity: reduced     General Gait Details: pt with slow, steady gait with use of RW; no overt LOB or need for physical assistance throughout   Stairs             Wheelchair Mobility    Modified Rankin (Stroke Patients Only) Modified Rankin (Stroke Patients Only) Pre-Morbid Rankin Score: No symptoms Modified Rankin: Moderately severe disability     Balance Overall balance assessment: Needs assistance Sitting-balance support: Feet supported Sitting balance-Leahy Scale: Good     Standing balance support: During functional activity;Single extremity supported;Bilateral upper extremity supported;No upper extremity supported Standing balance-Leahy Scale: Fair                              Cognition Arousal/Alertness: Awake/alert Behavior During Therapy: Flat affect Overall Cognitive Status: Impaired/Different from baseline Area of Impairment: Memory;Following commands;Safety/judgement;Awareness;Problem solving                     Memory: Decreased short-term memory Following Commands: Follows one step commands with increased time;Follows multi-step commands inconsistently Safety/Judgement: Decreased awareness of safety;Decreased awareness of deficits Awareness: Intellectual Problem Solving: Slow processing;Difficulty sequencing;Requires verbal cues;Requires tactile cues          Exercises      General Comments        Pertinent Vitals/Pain Pain Assessment: No/denies pain  Home Living                          Prior Function             PT Goals (current goals can now be found in the care plan section) Acute Rehab PT Goals PT Goal Formulation: With patient Time For Goal Achievement: 10/01/21 Potential to Achieve Goals: Good Progress towards PT goals: Progressing toward goals    Frequency    Min 3X/week      PT Plan Frequency needs to be updated    Co-evaluation              AM-PAC PT "6 Clicks" Mobility   Outcome Measure  Help needed turning from your back to your side while in a flat bed without using bedrails?: None Help needed moving from lying on your back to sitting on the side of a flat bed without using bedrails?: None Help needed moving to and from a bed to a chair (including a wheelchair)?: A Little Help needed standing up from a chair using your arms (e.g., wheelchair or bedside chair)?: A Little Help needed to walk in hospital room?: A Little Help needed climbing 3-5 steps with a railing? : A Little 6 Click Score: 20    End of Session Equipment Utilized During Treatment: Gait belt Activity Tolerance: Patient tolerated treatment well Patient left: in chair;with call bell/phone within reach;with chair alarm set;with family/visitor present Nurse Communication: Mobility status PT Visit Diagnosis: Other abnormalities of gait and mobility (R26.89)     Time: 2094-7096 PT Time Calculation (min) (ACUTE ONLY): 19 min  Charges:  $Gait Training: 8-22 mins                     Arletta Bale, DPT  Acute Rehabilitation Services Office [phone]    Alessandra Bevels Rakeya Glab 09/18/2021, 12:16 PM

## 2021-09-18 NOTE — Plan of Care (Signed)
°  Problem: Education: Goal: Knowledge of disease or condition will improve 09/18/2021 1222 by Melvenia Needles, RN Outcome: Progressing 09/18/2021 1222 by Melvenia Needles, RN Outcome: Progressing Goal: Knowledge of secondary prevention will improve (SELECT ALL) 09/18/2021 1222 by Melvenia Needles, RN Outcome: Progressing 09/18/2021 1222 by Melvenia Needles, RN Outcome: Progressing Goal: Knowledge of patient specific risk factors will improve (INDIVIDUALIZE FOR PATIENT) 09/18/2021 1222 by Melvenia Needles, RN Outcome: Progressing 09/18/2021 1222 by Melvenia Needles, RN Outcome: Progressing Goal: Individualized Educational Video(s) 09/18/2021 1222 by Melvenia Needles, RN Outcome: Progressing 09/18/2021 1222 by Melvenia Needles, RN Outcome: Progressing   Problem: Education: Goal: Knowledge of General Education information will improve Description: Including pain rating scale, medication(s)/side effects and non-pharmacologic comfort measures Outcome: Progressing   Problem: Clinical Measurements: Goal: Ability to maintain clinical measurements within normal limits will improve Outcome: Progressing Goal: Will remain free from infection Outcome: Progressing Goal: Diagnostic test results will improve Outcome: Progressing Goal: Respiratory complications will improve Outcome: Progressing Goal: Cardiovascular complication will be avoided Outcome: Progressing

## 2021-09-18 NOTE — Care Management Important Message (Signed)
Important Message  Patient Details  Name: RAMI WADDLE MRN: [id number] Date of Birth: October 21, 1960   Medicare Important Message Given:  Yes     Belma Dyches Stefan Church 09/18/2021, 3:16 PM

## 2021-09-18 NOTE — Progress Notes (Signed)
PROGRESS NOTE  [name] [id number] DOB: [birth year] DOA: 09/15/2021 PCP: Christain Sacramento, MD   LOS: 2 days   Brief Narrative / Interim history: 60 year old female with history of CAD/CABG, hypertension, hyperlipidemia, IDDM, hypothyroidism, chronic diastolic CHF, bipolar disorder who comes into the hospital with intermittent confusion.  An MRI of the brain showed thalamic strokes with petechial hemorrhage.  Neurology consulted, work-up completed and awaiting SNF bed  Subjective / 24h Interval events: She is doing well this morning, denies any chest pain, denies any shortness of breath.  No abdominal pain, no nausea or vomiting.  Assessment & Plan: Principal Problem Acute CVA-imaging showed 14 mm left thalamic lacunar CVA with petechial hemorrhage.  Neurology consulted and followed patient while hospitalized.  2D echo showed an EF of 60% and grade 2 diastolic dysfunction.  A1c was 9.0, LDL 226.  She is on a statin.  Neurology recommends dual antiplatelet therapy with aspirin and Plavix for 3 weeks, followed by Plavix alone.  Therapy recommends SNF, placement pending  Active Problems History of coronary artery disease-has a history of CABG.  Currently chest pain-free  Chronic diastolic CHF -appears euvolemic on exam.  Continue to monitor  Essential hypertension-allow permissive hypertension for 3 to 5 days, gradually resume home medications  Hyperlipidemia-continue statin  Obesity, class III-BMI 48, she would benefit from weight loss  Chronic kidney disease, stage IIIa-Baseline creatinine around 1.2, currently close to baseline around 1.4.  Continue to monitor.  Avoid nephrotoxins.  Insulin-dependent diabetes mellitus, poorly controlled with hyper and hypoglycemia.  Continue to further adjust her insulin, likely hypoglycemic here due to controlled p.o. intake  CBG (last 3)  Recent Labs    09/18/21 0640 09/18/21 0656 09/18/21 0708  GLUCAP 58* 56* 77    Scheduled  Meds:  aspirin EC  81 mg Oral Daily   busPIRone  30 mg Oral TID   clopidogrel  75 mg Oral Daily   DULoxetine  60 mg Oral BID   ezetimibe  10 mg Oral Daily   heparin  5,000 Units Subcutaneous Q8H   insulin aspart  0-15 Units Subcutaneous TID AC & HS   insulin aspart protamine- aspart  35 Units Subcutaneous BID WC   levothyroxine  50 mcg Oral QAC breakfast   risperiDONE  2 mg Oral BID   rosuvastatin  40 mg Oral Daily   traZODone  200 mg Oral QHS   Continuous Infusions: PRN Meds:.acetaminophen **OR** acetaminophen, hydrALAZINE, hydrOXYzine, ipratropium-albuterol, metoprolol tartrate, nitroGLYCERIN, ondansetron **OR** ondansetron (ZOFRAN) IV, polyethylene glycol, senna-docusate  Diet Orders (From admission, onward)     Start     Ordered   09/16/21 0630  Diet heart healthy/carb modified Room service appropriate? Yes; Fluid consistency: Thin  Diet effective now       Question Answer Comment  Diet-HS Snack? Nothing   Room service appropriate? Yes   Fluid consistency: Thin      09/16/21 0634            DVT prophylaxis: heparin injection 5,000 Units Start: 09/16/21 0645     Code Status: Full Code  Family Communication: no family at bedside   Status is: Inpatient  Remains inpatient appropriate because: Awaiting placement, PASSR number per SW  Level of care: Telemetry Medical  Consultants:  Neurology   Procedures:  2D echo  Microbiology  none  Antimicrobials: none    Objective: Vitals:   09/17/21 2025 09/18/21 0013 09/18/21 0400 09/18/21 0902  BP: (!) 149/70 (!) 149/79 (!) 177/83 (!) 147/74  Pulse: Marland Kitchen)  102 72 66 77  Resp: 18 20 18 16   Temp: 97.7 F (36.5 C) 97.7 F (36.5 C) 97.7 F (36.5 C) 97.6 F (36.4 C)  TempSrc: Oral Oral Oral Oral  SpO2: 93% 95% 97% 96%  Weight:      Height:        Intake/Output Summary (Last 24 hours) at 09/18/2021 0925 Last data filed at 09/18/2021 0549 Gross per 24 hour  Intake --  Output 700 ml  Net -700 ml   Filed  Weights   09/15/21 1632  Weight: 127 kg    Examination:  Constitutional: NAD Eyes: no scleral icterus ENMT: Mucous membranes are moist.  Neck: normal, supple Respiratory: clear to auscultation bilaterally, no wheezing, no crackles.  Cardiovascular: Regular rate and rhythm, no murmurs / rubs / gallops. No LE edema. Abdomen: non distended, no tenderness. Bowel sounds positive.  Musculoskeletal: no clubbing / cyanosis.  Skin: no rashes Neurologic: nonfocal    Data Reviewed: I have independently reviewed following labs and imaging studies   CBC: Recent Labs  Lab 09/15/21 1651 09/15/21 1739 09/15/21 2335 09/16/21 0755 09/17/21 0145 09/18/21 0156  WBC 6.8  --   --  5.5 6.3 6.7  NEUTROABS  --   --   --  3.3  --   --   HGB 14.8 14.3 13.3   13.6 13.3 12.5 12.4  HCT 43.0 42.0 39.0   40.0 39.5 37.9 36.8  MCV 82.2  --   --  84.2 83.5 83.3  PLT 265  --   --  236 268 246   Basic Metabolic Panel: Recent Labs  Lab 09/15/21 1651 09/15/21 1739 09/15/21 2335 09/16/21 0755 09/17/21 0145 09/18/21 0156  NA 135 133* 136   137 136 137 137  K 4.7 7.3* 5.1   5.1 4.1 3.6 4.2  CL 98  --  99 103 101 104  CO2 28  --   --  24 28 26   GLUCOSE 314*  --  232* 216* 84 95  BUN 28*  --  36* 23 32* 30*  CREATININE 1.21*  --  1.10* 1.22* 1.45* 1.47*  CALCIUM 9.2  --   --  8.7* 9.1 9.1  MG  --   --   --  1.7 1.9 1.9   Liver Function Tests: Recent Labs  Lab 09/15/21 1651 09/16/21 0755 09/17/21 0145  AST 16 16 13*  ALT 18 15 14   ALKPHOS 82 69 69  BILITOT 0.5 1.0 0.7  PROT 7.6 6.0* 6.1*  ALBUMIN 3.9 3.2* 3.1*   Coagulation Profile: Recent Labs  Lab 09/15/21 1723  INR 0.9   HbA1C: Recent Labs    09/16/21 0624 09/16/21 0755  HGBA1C 9.0* 8.9*   CBG: Recent Labs  Lab 09/18/21 0011 09/18/21 0616 09/18/21 0640 09/18/21 0656 09/18/21 0708  GLUCAP 114* 58* 58* 56* 77    Recent Results (from the past 240 hour(s))  Resp Panel by RT-PCR (Flu A&B, Covid) Nasopharyngeal Swab      Status: None   Collection Time: 09/15/21  5:23 PM   Specimen: Nasopharyngeal Swab; Nasopharyngeal(NP) swabs in vial transport medium  Result Value Ref Range Status   SARS Coronavirus 2 by RT PCR NEGATIVE NEGATIVE Final    Comment: (NOTE) SARS-CoV-2 target nucleic acids are NOT DETECTED.  The SARS-CoV-2 RNA is generally detectable in upper respiratory specimens during the acute phase of infection. The lowest concentration of SARS-CoV-2 viral copies this assay can detect is 138 copies/mL. A negative result does not  preclude SARS-Cov-2 infection and should not be used as the sole basis for treatment or other patient management decisions. A negative result may occur with  improper specimen collection/handling, submission of specimen other than nasopharyngeal swab, presence of viral mutation(s) within the areas targeted by this assay, and inadequate number of viral copies(<138 copies/mL). A negative result must be combined with clinical observations, patient history, and epidemiological information. The expected result is Negative.  Fact Sheet for Patients:  EntrepreneurPulse.com.au  Fact Sheet for Healthcare Providers:  IncredibleEmployment.be  This test is no t yet approved or cleared by the [place] FDA and  has been authorized for detection and/or diagnosis of SARS-CoV-2 by FDA under an Emergency Use Authorization (EUA). This EUA will remain  in effect (meaning this test can be used) for the duration of the COVID-19 declaration under Section 564(b)(1) of the Act, 21 U.S.C.section 360bbb-3(b)(1), unless the authorization is terminated  or revoked sooner.       Influenza A by PCR NEGATIVE NEGATIVE Final   Influenza B by PCR NEGATIVE NEGATIVE Final    Comment: (NOTE) The Xpert Xpress SARS-CoV-2/FLU/RSV plus assay is intended as an aid in the diagnosis of influenza from Nasopharyngeal swab specimens and should not be used as a sole basis  for treatment. Nasal washings and aspirates are unacceptable for Xpert Xpress SARS-CoV-2/FLU/RSV testing.  Fact Sheet for Patients: EntrepreneurPulse.com.au  Fact Sheet for Healthcare Providers: IncredibleEmployment.be  This test is not yet approved or cleared by the [place] FDA and has been authorized for detection and/or diagnosis of SARS-CoV-2 by FDA under an Emergency Use Authorization (EUA). This EUA will remain in effect (meaning this test can be used) for the duration of the COVID-19 declaration under Section 564(b)(1) of the Act, 21 U.S.C. section 360bbb-3(b)(1), unless the authorization is terminated or revoked.  Performed at [facility], [address] [place], [place] [address]      Radiology Studies: No results found.   Marzetta Board, MD, PhD [facility]  Between 7 am - 7 pm I am available, please contact me via Amion (for emergencies) or Securechat (non urgent messages)  Between 7 pm - 7 am I am not available, please contact night coverage MD/APP via Amion

## 2021-09-19 DIAGNOSIS — E1165 Type 2 diabetes mellitus with hyperglycemia: Secondary | ICD-10-CM | POA: Diagnosis not present

## 2021-09-19 DIAGNOSIS — I10 Essential (primary) hypertension: Secondary | ICD-10-CM | POA: Diagnosis not present

## 2021-09-19 DIAGNOSIS — I5032 Chronic diastolic (congestive) heart failure: Secondary | ICD-10-CM | POA: Diagnosis not present

## 2021-09-19 DIAGNOSIS — I6381 Other cerebral infarction due to occlusion or stenosis of small artery: Secondary | ICD-10-CM | POA: Diagnosis not present

## 2021-09-19 DIAGNOSIS — R4701 Aphasia: Secondary | ICD-10-CM | POA: Diagnosis not present

## 2021-09-19 LAB — BASIC METABOLIC PANEL
Anion gap: 7 (ref 5–15)
BUN: 23 mg/dL (ref 8–23)
CO2: 27 mmol/L (ref 22–32)
Calcium: 9.1 mg/dL (ref 8.9–10.3)
Chloride: 104 mmol/L (ref 98–111)
Creatinine, Ser: 1.38 mg/dL — ABNORMAL HIGH (ref 0.44–1.00)
GFR, Estimated: 44 mL/min — ABNORMAL LOW (ref >60)
Glucose, Bld: 59 mg/dL — ABNORMAL LOW (ref 70–99)
Potassium: 3.7 mmol/L (ref 3.5–5.1)
Sodium: 138 mmol/L (ref 135–145)

## 2021-09-19 LAB — MAGNESIUM: Magnesium: 1.9 mg/dL (ref 1.7–2.4)

## 2021-09-19 LAB — CBC
HCT: 37.9 % (ref 36.0–46.0)
Hemoglobin: 12.7 g/dL (ref 12.0–15.0)
MCH: 27.8 pg (ref 26.0–34.0)
MCHC: 33.5 g/dL (ref 30.0–36.0)
MCV: 82.9 fL (ref 80.0–100.0)
Platelets: 236 10*3/uL (ref 150–400)
RBC: 4.57 MIL/uL (ref 3.87–5.11)
RDW: 12.7 % (ref 11.5–15.5)
WBC: 5.3 10*3/uL (ref 4.0–10.5)
nRBC: 0 % (ref 0.0–0.2)

## 2021-09-19 LAB — GLUCOSE, CAPILLARY
Glucose-Capillary: 107 mg/dL — ABNORMAL HIGH (ref 70–99)
Glucose-Capillary: 228 mg/dL — ABNORMAL HIGH (ref 70–99)
Glucose-Capillary: 250 mg/dL — ABNORMAL HIGH (ref 70–99)
Glucose-Capillary: 187 mg/dL — ABNORMAL HIGH (ref 70–99)

## 2021-09-19 MED ORDER — INSULIN ASPART PROT & ASPART (70-30 MIX) 100 UNIT/ML ~~LOC~~ SUSP
25.0000 [IU] | Freq: Two times a day (BID) | SUBCUTANEOUS | Status: DC
Start: 2021-09-19 — End: 2021-09-23
  Administered 2021-09-19 – 2021-09-23 (×8): 25 [IU] via SUBCUTANEOUS

## 2021-09-19 NOTE — Progress Notes (Signed)
Patient ID: [name], female   DOB: [birth year], 60 y.o.   MRN: [id number]  PROGRESS NOTE    LATEISHA DEITZ  LXB:[id number] DOB: 08/09/1961 DOA: 09/15/2021 PCP: Barbie Banner, MD   Brief Narrative:  60 year old female with history of CAD/CABG, hypertension, hyperlipidemia, IDDM, hypothyroidism, chronic diastolic CHF, bipolar disorder presented with intermittent confusion.   An MRI of the brain showed thalamic strokes with petechial hemorrhage.  Neurology consulted, work-up completed and awaiting SNF bed.  Assessment & Plan:   Acute CVA -imaging showed 14 mm left thalamic lacunar CVA with petechial hemorrhage.   -Neurology has signed off: recommends dual antiplatelet therapy with aspirin and Plavix for 3 weeks, followed by Plavix alone.  Continue statin and ezetimibe.  Outpatient follow-up with neurology -2D echo showed an EF of 60% and grade 2 diastolic dysfunction.  A1c was 9.0, LDL 226.   -Therapy recommends SNF, placement pending   History of coronary artery disease-has a history of CABG.   -Currently chest pain-free   Chronic diastolic CHF  -appears euvolemic on exam.  Continue to monitor.  Strict input and output.  Daily weights.  Fluid restriction.   Essential hypertension -allow permissive hypertension for 3 to 5 days, gradually resume home medications.  Blood pressure intermittently elevated.  Hyperlipidemia-continue statin and ezetimibe    morbid obesity -Outpatient follow-up: She would benefit from weight loss  Chronic kidney disease, stage IIIa-Baseline creatinine around 1.2, creatinine 1.3 today.  Insulin-dependent diabetes mellitus, poorly controlled with hyper and hypoglycemia.   -Decrease NPH insulin to 25 units twice a day.  Continue CBGs with SSI.    DVT prophylaxis: Heparin subcutaneous Code Status: Full Family Communication: None at bedside Disposition Plan: Status is: Inpatient  Remains inpatient appropriate because: Of need for SNF  placement. Currently medically stable for discharge  Consultants: Neurology  Procedures: 2D echo  Antimicrobials: None   Subjective: Patient seen and examined at bedside.  Denies worsening chest pain, fever, nausea or vomiting.  Objective: Vitals:   09/18/21 2206 09/18/21 2345 09/19/21 0358 09/19/21 0743  BP: (!) 147/72 (!) 143/74 (!) 147/69 (!) 167/89  Pulse: 74 69 97 85  Resp: 18 18 16 14   Temp: 98.2 F (36.8 C) 98.1 F (36.7 C) 98.2 F (36.8 C) 97.8 F (36.6 C)  TempSrc: Oral Oral Oral Oral  SpO2: 98% 98%  97%  Weight:      Height:        Intake/Output Summary (Last 24 hours) at 09/19/2021 1035 Last data filed at 09/18/2021 1743 Gross per 24 hour  Intake 465 ml  Output 500 ml  Net -35 ml   Filed Weights   09/15/21 1632  Weight: 127 kg    Examination:  General exam: Appears calm and comfortable.  Currently on room air. Respiratory system: Bilateral decreased breath sounds at bases with some scattered crackles Cardiovascular system: S1 & S2 heard, Rate controlled Gastrointestinal system: Abdomen is morbidly obese, nondistended, soft and nontender. Normal bowel sounds heard. Extremities: No cyanosis, clubbing; trace lower extremity edema   Data Reviewed: I have personally reviewed following labs and imaging studies  CBC: Recent Labs  Lab 09/15/21 1651 09/15/21 1739 09/15/21 2335 09/16/21 0755 09/17/21 0145 09/18/21 0156 09/19/21 0059  WBC 6.8  --   --  5.5 6.3 6.7 5.3  NEUTROABS  --   --   --  3.3  --   --   --   HGB 14.8   < > 13.3   13.6 13.3 12.5 12.4  12.7  HCT 43.0   < > 39.0   40.0 39.5 37.9 36.8 37.9  MCV 82.2  --   --  84.2 83.5 83.3 82.9  PLT 265  --   --  236 268 246 236   < > = values in this interval not displayed.   Basic Metabolic Panel: Recent Labs  Lab 09/15/21 1651 09/15/21 1739 09/15/21 2335 09/16/21 0755 09/17/21 0145 09/18/21 0156 09/19/21 0059  NA 135   < > 136   137 136 137 137 138  K 4.7   < > 5.1   5.1 4.1 3.6 4.2  3.7  CL 98  --  99 103 101 104 104  CO2 28  --   --  24 28 26 27   GLUCOSE 314*  --  232* 216* 84 95 59*  BUN 28*  --  36* 23 32* 30* 23  CREATININE 1.21*  --  1.10* 1.22* 1.45* 1.47* 1.38*  CALCIUM 9.2  --   --  8.7* 9.1 9.1 9.1  MG  --   --   --  1.7 1.9 1.9 1.9   < > = values in this interval not displayed.   GFR: Estimated Creatinine Clearance: 56.5 mL/min (A) (by C-G formula based on SCr of 1.38 mg/dL (H)). Liver Function Tests: Recent Labs  Lab 09/15/21 1651 09/16/21 0755 09/17/21 0145  AST 16 16 13*  ALT 18 15 14   ALKPHOS 82 69 69  BILITOT 0.5 1.0 0.7  PROT 7.6 6.0* 6.1*  ALBUMIN 3.9 3.2* 3.1*   Recent Labs  Lab 09/15/21 1723  LIPASE 40   Recent Labs  Lab 09/15/21 1723  AMMONIA 36*   Coagulation Profile: Recent Labs  Lab 09/15/21 1723  INR 0.9   Cardiac Enzymes: No results for input(s): CKTOTAL, CKMB, CKMBINDEX, TROPONINI in the last 168 hours. BNP (last 3 results) No results for input(s): PROBNP in the last 8760 hours. HbA1C: No results for input(s): HGBA1C in the last 72 hours. CBG: Recent Labs  Lab 09/18/21 0708 09/18/21 1123 09/18/21 1623 09/18/21 2108 09/19/21 0644  GLUCAP 77 158* 208* 179* 107*   Lipid Profile: No results for input(s): CHOL, HDL, LDLCALC, TRIG, CHOLHDL, LDLDIRECT in the last 72 hours. Thyroid Function Tests: No results for input(s): TSH, T4TOTAL, FREET4, T3FREE, THYROIDAB in the last 72 hours. Anemia Panel: No results for input(s): VITAMINB12, FOLATE, FERRITIN, TIBC, IRON, RETICCTPCT in the last 72 hours. Sepsis Labs: No results for input(s): PROCALCITON, LATICACIDVEN in the last 168 hours.  Recent Results (from the past 240 hour(s))  Resp Panel by RT-PCR (Flu A&B, Covid) Nasopharyngeal Swab     Status: None   Collection Time: 09/15/21  5:23 PM   Specimen: Nasopharyngeal Swab; Nasopharyngeal(NP) swabs in vial transport medium  Result Value Ref Range Status   SARS Coronavirus 2 by RT PCR NEGATIVE NEGATIVE Final     Comment: (NOTE) SARS-CoV-2 target nucleic acids are NOT DETECTED.  The SARS-CoV-2 RNA is generally detectable in upper respiratory specimens during the acute phase of infection. The lowest concentration of SARS-CoV-2 viral copies this assay can detect is 138 copies/mL. A negative result does not preclude SARS-Cov-2 infection and should not be used as the sole basis for treatment or other patient management decisions. A negative result may occur with  improper specimen collection/handling, submission of specimen other than nasopharyngeal swab, presence of viral mutation(s) within the areas targeted by this assay, and inadequate number of viral copies(<138 copies/mL). A negative result must be combined with  clinical observations, patient history, and epidemiological information. The expected result is Negative.  Fact Sheet for Patients:  EntrepreneurPulse.com.au  Fact Sheet for Healthcare Providers:  IncredibleEmployment.be  This test is no t yet approved or cleared by the [place] FDA and  has been authorized for detection and/or diagnosis of SARS-CoV-2 by FDA under an Emergency Use Authorization (EUA). This EUA will remain  in effect (meaning this test can be used) for the duration of the COVID-19 declaration under Section 564(b)(1) of the Act, 21 U.S.C.section 360bbb-3(b)(1), unless the authorization is terminated  or revoked sooner.       Influenza A by PCR NEGATIVE NEGATIVE Final   Influenza B by PCR NEGATIVE NEGATIVE Final    Comment: (NOTE) The Xpert Xpress SARS-CoV-2/FLU/RSV plus assay is intended as an aid in the diagnosis of influenza from Nasopharyngeal swab specimens and should not be used as a sole basis for treatment. Nasal washings and aspirates are unacceptable for Xpert Xpress SARS-CoV-2/FLU/RSV testing.  Fact Sheet for Patients: EntrepreneurPulse.com.au  Fact Sheet for Healthcare  Providers: IncredibleEmployment.be  This test is not yet approved or cleared by the [place] FDA and has been authorized for detection and/or diagnosis of SARS-CoV-2 by FDA under an Emergency Use Authorization (EUA). This EUA will remain in effect (meaning this test can be used) for the duration of the COVID-19 declaration under Section 564(b)(1) of the Act, 21 U.S.C. section 360bbb-3(b)(1), unless the authorization is terminated or revoked.  Performed at [facility], [address], [place], [place] [address]          Radiology Studies: No results found.      Scheduled Meds:  aspirin EC  81 mg Oral Daily   busPIRone  30 mg Oral TID   clopidogrel  75 mg Oral Daily   DULoxetine  60 mg Oral BID   ezetimibe  10 mg Oral Daily   heparin  5,000 Units Subcutaneous Q8H   insulin aspart  0-15 Units Subcutaneous TID AC & HS   insulin aspart protamine- aspart  35 Units Subcutaneous BID WC   levothyroxine  50 mcg Oral QAC breakfast   risperiDONE  2 mg Oral BID   rosuvastatin  40 mg Oral Daily   traZODone  200 mg Oral QHS   Continuous Infusions:        Aline August, MD [facility] 09/19/2021, 10:35 AM

## 2021-09-19 NOTE — Plan of Care (Signed)
  Problem: Education: Goal: Knowledge of disease or condition will improve Outcome: Progressing Goal: Knowledge of secondary prevention will improve (SELECT ALL) Outcome: Progressing Goal: Knowledge of patient specific risk factors will improve (INDIVIDUALIZE FOR PATIENT) Outcome: Progressing Goal: Individualized Educational Video(s) Outcome: Progressing   Problem: Education: Goal: Knowledge of General Education information will improve Description: Including pain rating scale, medication(s)/side effects and non-pharmacologic comfort measures Outcome: Progressing   Problem: Health Behavior/Discharge Planning: Goal: Ability to manage health-related needs will improve Outcome: Progressing   Problem: Clinical Measurements: Goal: Ability to maintain clinical measurements within normal limits will improve Outcome: Progressing Goal: Will remain free from infection Outcome: Progressing Goal: Diagnostic test results will improve Outcome: Progressing Goal: Respiratory complications will improve Outcome: Progressing Goal: Cardiovascular complication will be avoided Outcome: Progressing   Problem: Activity: Goal: Risk for activity intolerance will decrease Outcome: Progressing   Problem: Nutrition: Goal: Adequate nutrition will be maintained Outcome: Progressing   Problem: Coping: Goal: Level of anxiety will decrease Outcome: Progressing   Problem: Elimination: Goal: Will not experience complications related to bowel motility Outcome: Progressing Goal: Will not experience complications related to urinary retention Outcome: Progressing   Problem: Pain Managment: Goal: General experience of comfort will improve Outcome: Progressing   Problem: Safety: Goal: Ability to remain free from injury will improve Outcome: Progressing   Problem: Skin Integrity: Goal: Risk for impaired skin integrity will decrease Outcome: Progressing   

## 2021-09-20 DIAGNOSIS — I10 Essential (primary) hypertension: Secondary | ICD-10-CM | POA: Diagnosis not present

## 2021-09-20 DIAGNOSIS — I6381 Other cerebral infarction due to occlusion or stenosis of small artery: Secondary | ICD-10-CM | POA: Diagnosis not present

## 2021-09-20 DIAGNOSIS — I5032 Chronic diastolic (congestive) heart failure: Secondary | ICD-10-CM | POA: Diagnosis not present

## 2021-09-20 DIAGNOSIS — R4701 Aphasia: Secondary | ICD-10-CM | POA: Diagnosis not present

## 2021-09-20 LAB — GLUCOSE, CAPILLARY
Glucose-Capillary: 119 mg/dL — ABNORMAL HIGH (ref 70–99)
Glucose-Capillary: 199 mg/dL — ABNORMAL HIGH (ref 70–99)
Glucose-Capillary: 227 mg/dL — ABNORMAL HIGH (ref 70–99)
Glucose-Capillary: 233 mg/dL — ABNORMAL HIGH (ref 70–99)

## 2021-09-20 LAB — CBC
HCT: 36.4 % (ref 36.0–46.0)
Hemoglobin: 12.4 g/dL (ref 12.0–15.0)
MCH: 28.5 pg (ref 26.0–34.0)
MCHC: 34.1 g/dL (ref 30.0–36.0)
MCV: 83.7 fL (ref 80.0–100.0)
Platelets: 247 10*3/uL (ref 150–400)
RBC: 4.35 MIL/uL (ref 3.87–5.11)
RDW: 12.5 % (ref 11.5–15.5)
WBC: 4.7 10*3/uL (ref 4.0–10.5)
nRBC: 0 % (ref 0.0–0.2)

## 2021-09-20 LAB — BASIC METABOLIC PANEL
Anion gap: 8 (ref 5–15)
BUN: 22 mg/dL (ref 8–23)
CO2: 30 mmol/L (ref 22–32)
Calcium: 9.1 mg/dL (ref 8.9–10.3)
Chloride: 103 mmol/L (ref 98–111)
Creatinine, Ser: 1.34 mg/dL — ABNORMAL HIGH (ref 0.44–1.00)
GFR, Estimated: 45 mL/min — ABNORMAL LOW (ref >60)
Glucose, Bld: 82 mg/dL (ref 70–99)
Potassium: 4 mmol/L (ref 3.5–5.1)
Sodium: 141 mmol/L (ref 135–145)

## 2021-09-20 LAB — MAGNESIUM: Magnesium: 2.1 mg/dL (ref 1.7–2.4)

## 2021-09-20 NOTE — Progress Notes (Signed)
Patient ID: [name], female   DOB: 18-May-1961, 60 y.o.   MRN: [id number]  PROGRESS NOTE    [name]  QIH:[id number] DOB: [birth year] DOA: 09/15/2021 PCP: Barbie Banner, MD   Brief Narrative:  60 year old female with history of CAD/CABG, hypertension, hyperlipidemia, IDDM, hypothyroidism, chronic diastolic CHF, bipolar disorder presented with intermittent confusion.   An MRI of the brain showed thalamic strokes with petechial hemorrhage.  Neurology consulted, work-up completed and awaiting SNF bed.  Assessment & Plan:   Acute CVA -imaging showed 14 mm left thalamic lacunar CVA with petechial hemorrhage.   -Neurology has signed off: recommends dual antiplatelet therapy with aspirin and Plavix for 3 weeks, followed by Plavix alone.  Continue statin and ezetimibe.  Outpatient follow-up with neurology -2D echo showed an EF of 60% and grade 2 diastolic dysfunction.  A1c was 9.0, LDL 226.   -Therapy recommends SNF, placement pending   History of coronary artery disease-has a history of CABG.   -Currently chest pain-free   Chronic diastolic CHF  -appears euvolemic on exam.  Continue to monitor.  Strict input and output.  Daily weights.  Fluid restriction.   Essential hypertension -allow permissive hypertension for 3 to 5 days, gradually resume home medications.  Blood pressure intermittently elevated.  Hyperlipidemia-continue statin and ezetimibe    morbid obesity -Outpatient follow-up: She would benefit from weight loss  Chronic kidney disease, stage IIIa-Baseline creatinine around 1.2, creatinine 1.34 today.  Insulin-dependent diabetes mellitus, poorly controlled with hyper and hypoglycemia.   -Continue decreased dose of insulin NPH.  Continue CBGs with SSI.    DVT prophylaxis: Heparin subcutaneous Code Status: Full Family Communication: None at bedside Disposition Plan: Status is: Inpatient  Remains inpatient appropriate because: Of need for SNF  placement. Currently medically stable for discharge  Consultants: Neurology  Procedures: 2D echo  Antimicrobials: None   Subjective: Patient seen and examined at bedside.  No fever, worsening shortness of breath, vomiting reported. Objective: Vitals:   09/19/21 1600 09/19/21 2142 09/19/21 2320 09/20/21 0357  BP: 140/66 (!) 146/78 140/67 (!) 163/89  Pulse: 67 72 79 88  Resp: 14 19 19 17   Temp: 98.6 F (37 C) 98.2 F (36.8 C) 98.3 F (36.8 C) 97.7 F (36.5 C)  TempSrc: Oral Oral Oral Oral  SpO2: 97% 98% 96% 97%  Weight:      Height:       No intake or output data in the 24 hours ending 09/20/21 0730  Filed Weights   09/15/21 1632  Weight: 127 kg    Examination:  General exam: On room air.  No distress.   Respiratory system: Decreased breath sounds at bases bilaterally cardiovascular system: Rate controlled; S1-S2 heard gastrointestinal system: Abdomen is morbidly obese, distended slightly; soft and nontender.  Bowel sounds are heard  extremities: Mild lower extremity edema present; no clubbing  Data Reviewed: I have personally reviewed following labs and imaging studies  CBC: Recent Labs  Lab 09/16/21 0755 09/17/21 0145 09/18/21 0156 09/19/21 0059 09/20/21 0243  WBC 5.5 6.3 6.7 5.3 4.7  NEUTROABS 3.3  --   --   --   --   HGB 13.3 12.5 12.4 12.7 12.4  HCT 39.5 37.9 36.8 37.9 36.4  MCV 84.2 83.5 83.3 82.9 83.7  PLT 236 268 246 236 247    Basic Metabolic Panel: Recent Labs  Lab 09/16/21 0755 09/17/21 0145 09/18/21 0156 09/19/21 0059 09/20/21 0243  NA 136 137 137 138 141  K 4.1 3.6 4.2 3.7  4.0  CL 103 101 104 104 103  CO2 24 28 26 27 30   GLUCOSE 216* 84 95 59* 82  BUN 23 32* 30* 23 22  CREATININE 1.22* 1.45* 1.47* 1.38* 1.34*  CALCIUM 8.7* 9.1 9.1 9.1 9.1  MG 1.7 1.9 1.9 1.9 2.1    GFR: Estimated Creatinine Clearance: 58.2 mL/min (A) (by C-G formula based on SCr of 1.34 mg/dL (H)). Liver Function Tests: Recent Labs  Lab 09/15/21 1651  09/16/21 0755 09/17/21 0145  AST 16 16 13*  ALT 18 15 14   ALKPHOS 82 69 69  BILITOT 0.5 1.0 0.7  PROT 7.6 6.0* 6.1*  ALBUMIN 3.9 3.2* 3.1*    Recent Labs  Lab 09/15/21 1723  LIPASE 40    Recent Labs  Lab 09/15/21 1723  AMMONIA 36*    Coagulation Profile: Recent Labs  Lab 09/15/21 1723  INR 0.9    Cardiac Enzymes: No results for input(s): CKTOTAL, CKMB, CKMBINDEX, TROPONINI in the last 168 hours. BNP (last 3 results) No results for input(s): PROBNP in the last 8760 hours. HbA1C: No results for input(s): HGBA1C in the last 72 hours. CBG: Recent Labs  Lab 09/19/21 0644 09/19/21 1148 09/19/21 1603 09/19/21 2142 09/20/21 0633  GLUCAP 107* 228* 250* 187* 119*    Lipid Profile: No results for input(s): CHOL, HDL, LDLCALC, TRIG, CHOLHDL, LDLDIRECT in the last 72 hours. Thyroid Function Tests: No results for input(s): TSH, T4TOTAL, FREET4, T3FREE, THYROIDAB in the last 72 hours. Anemia Panel: No results for input(s): VITAMINB12, FOLATE, FERRITIN, TIBC, IRON, RETICCTPCT in the last 72 hours. Sepsis Labs: No results for input(s): PROCALCITON, LATICACIDVEN in the last 168 hours.  Recent Results (from the past 240 hour(s))  Resp Panel by RT-PCR (Flu A&B, Covid) Nasopharyngeal Swab     Status: None   Collection Time: 09/15/21  5:23 PM   Specimen: Nasopharyngeal Swab; Nasopharyngeal(NP) swabs in vial transport medium  Result Value Ref Range Status   SARS Coronavirus 2 by RT PCR NEGATIVE NEGATIVE Final    Comment: (NOTE) SARS-CoV-2 target nucleic acids are NOT DETECTED.  The SARS-CoV-2 RNA is generally detectable in upper respiratory specimens during the acute phase of infection. The lowest concentration of SARS-CoV-2 viral copies this assay can detect is 138 copies/mL. A negative result does not preclude SARS-Cov-2 infection and should not be used as the sole basis for treatment or other patient management decisions. A negative result may occur with  improper  specimen collection/handling, submission of specimen other than nasopharyngeal swab, presence of viral mutation(s) within the areas targeted by this assay, and inadequate number of viral copies(<138 copies/mL). A negative result must be combined with clinical observations, patient history, and epidemiological information. The expected result is Negative.  Fact Sheet for Patients:  09/22/21  Fact Sheet for Healthcare Providers:  09/17/21  This test is no t yet approved or cleared by the [employer] FDA and  has been authorized for detection and/or diagnosis of SARS-CoV-2 by FDA under an Emergency Use Authorization (EUA). This EUA will remain  in effect (meaning this test can be used) for the duration of the COVID-19 declaration under Section 564(b)(1) of the Act, 21 U.S.C.section 360bbb-3(b)(1), unless the authorization is terminated  or revoked sooner.       Influenza A by PCR NEGATIVE NEGATIVE Final   Influenza B by PCR NEGATIVE NEGATIVE Final    Comment: (NOTE) The Xpert Xpress SARS-CoV-2/FLU/RSV plus assay is intended as an aid in the diagnosis of influenza from Nasopharyngeal swab specimens  and should not be used as a sole basis for treatment. Nasal washings and aspirates are unacceptable for Xpert Xpress SARS-CoV-2/FLU/RSV testing.  Fact Sheet for Patients: EntrepreneurPulse.com.au  Fact Sheet for Healthcare Providers: IncredibleEmployment.be  This test is not yet approved or cleared by the [place] FDA and has been authorized for detection and/or diagnosis of SARS-CoV-2 by FDA under an Emergency Use Authorization (EUA). This EUA will remain in effect (meaning this test can be used) for the duration of the COVID-19 declaration under Section 564(b)(1) of the Act, 21 U.S.C. section 360bbb-3(b)(1), unless the authorization is terminated or revoked.  Performed at  [facility], [address], [place], [place] [address]           Radiology Studies: No results found.      Scheduled Meds:  aspirin EC  81 mg Oral Daily   busPIRone  30 mg Oral TID   clopidogrel  75 mg Oral Daily   DULoxetine  60 mg Oral BID   ezetimibe  10 mg Oral Daily   heparin  5,000 Units Subcutaneous Q8H   insulin aspart  0-15 Units Subcutaneous TID AC & HS   insulin aspart protamine- aspart  25 Units Subcutaneous BID WC   levothyroxine  50 mcg Oral QAC breakfast   risperiDONE  2 mg Oral BID   rosuvastatin  40 mg Oral Daily   traZODone  200 mg Oral QHS   Continuous Infusions:        Aline August, MD [facility] 09/20/2021, 7:30 AM

## 2021-09-20 NOTE — Plan of Care (Signed)
  Problem: Education: Goal: Knowledge of disease or condition will improve Outcome: Progressing Goal: Knowledge of secondary prevention will improve (SELECT ALL) Outcome: Progressing Goal: Knowledge of patient specific risk factors will improve (INDIVIDUALIZE FOR PATIENT) Outcome: Progressing Goal: Individualized Educational Video(s) Outcome: Progressing   Problem: Education: Goal: Knowledge of General Education information will improve Description: Including pain rating scale, medication(s)/side effects and non-pharmacologic comfort measures Outcome: Progressing   Problem: Health Behavior/Discharge Planning: Goal: Ability to manage health-related needs will improve Outcome: Progressing   Problem: Clinical Measurements: Goal: Ability to maintain clinical measurements within normal limits will improve Outcome: Progressing Goal: Will remain free from infection Outcome: Progressing Goal: Diagnostic test results will improve Outcome: Progressing Goal: Respiratory complications will improve Outcome: Progressing Goal: Cardiovascular complication will be avoided Outcome: Progressing   Problem: Activity: Goal: Risk for activity intolerance will decrease Outcome: Progressing   Problem: Nutrition: Goal: Adequate nutrition will be maintained Outcome: Progressing   Problem: Coping: Goal: Level of anxiety will decrease Outcome: Progressing   Problem: Elimination: Goal: Will not experience complications related to bowel motility Outcome: Progressing Goal: Will not experience complications related to urinary retention Outcome: Progressing   Problem: Pain Managment: Goal: General experience of comfort will improve Outcome: Progressing   Problem: Safety: Goal: Ability to remain free from injury will improve Outcome: Progressing   Problem: Skin Integrity: Goal: Risk for impaired skin integrity will decrease Outcome: Progressing   

## 2021-09-21 DIAGNOSIS — R4701 Aphasia: Secondary | ICD-10-CM | POA: Diagnosis not present

## 2021-09-21 DIAGNOSIS — I10 Essential (primary) hypertension: Secondary | ICD-10-CM | POA: Diagnosis not present

## 2021-09-21 DIAGNOSIS — I6381 Other cerebral infarction due to occlusion or stenosis of small artery: Secondary | ICD-10-CM | POA: Diagnosis not present

## 2021-09-21 DIAGNOSIS — I5032 Chronic diastolic (congestive) heart failure: Secondary | ICD-10-CM | POA: Diagnosis not present

## 2021-09-21 DIAGNOSIS — E1165 Type 2 diabetes mellitus with hyperglycemia: Secondary | ICD-10-CM | POA: Diagnosis not present

## 2021-09-21 LAB — CBC
HCT: 36 % (ref 36.0–46.0)
Hemoglobin: 12.2 g/dL (ref 12.0–15.0)
MCH: 28.2 pg (ref 26.0–34.0)
MCHC: 33.9 g/dL (ref 30.0–36.0)
MCV: 83.3 fL (ref 80.0–100.0)
Platelets: 248 10*3/uL (ref 150–400)
RBC: 4.32 MIL/uL (ref 3.87–5.11)
RDW: 12.4 % (ref 11.5–15.5)
WBC: 5.8 10*3/uL (ref 4.0–10.5)
nRBC: 0 % (ref 0.0–0.2)

## 2021-09-21 LAB — BASIC METABOLIC PANEL
Anion gap: 8 (ref 5–15)
BUN: 24 mg/dL — ABNORMAL HIGH (ref 8–23)
CO2: 29 mmol/L (ref 22–32)
Calcium: 8.7 mg/dL — ABNORMAL LOW (ref 8.9–10.3)
Chloride: 101 mmol/L (ref 98–111)
Creatinine, Ser: 1.51 mg/dL — ABNORMAL HIGH (ref 0.44–1.00)
GFR, Estimated: 39 mL/min — ABNORMAL LOW (ref >60)
Glucose, Bld: 187 mg/dL — ABNORMAL HIGH (ref 70–99)
Potassium: 3.7 mmol/L (ref 3.5–5.1)
Sodium: 138 mmol/L (ref 135–145)

## 2021-09-21 LAB — GLUCOSE, CAPILLARY
Glucose-Capillary: 219 mg/dL — ABNORMAL HIGH (ref 70–99)
Glucose-Capillary: 205 mg/dL — ABNORMAL HIGH (ref 70–99)
Glucose-Capillary: 221 mg/dL — ABNORMAL HIGH (ref 70–99)
Glucose-Capillary: 146 mg/dL — ABNORMAL HIGH (ref 70–99)

## 2021-09-21 LAB — MAGNESIUM: Magnesium: 2 mg/dL (ref 1.7–2.4)

## 2021-09-21 MED ORDER — NOVOLIN 70/30 FLEXPEN (70-30) 100 UNIT/ML ~~LOC~~ SUPN: 25.0000 [IU] | PEN_INJECTOR | Freq: Two times a day (BID) | SUBCUTANEOUS | Status: AC

## 2021-09-21 MED ORDER — TRAZODONE HCL 100 MG PO TABS: 50.0000 mg | ORAL_TABLET | Freq: Every day | ORAL | Status: DC

## 2021-09-21 MED ORDER — EZETIMIBE 10 MG PO TABS: 10.0000 mg | ORAL_TABLET | Freq: Every day | ORAL | 0 refills | Status: DC

## 2021-09-21 MED ORDER — CLOPIDOGREL BISULFATE 75 MG PO TABS: 75.0000 mg | ORAL_TABLET | Freq: Every day | ORAL | 0 refills | Status: DC

## 2021-09-21 NOTE — Progress Notes (Signed)
Patient ID: [name], female   DOB: June 15, 1961, 60 y.o.   MRN: [id number]  PROGRESS NOTE    [name]  TDD:[id number] DOB: [birth year] DOA: 09/15/2021 PCP: Barbie Banner, MD   Brief Narrative:  60 year old female with history of CAD/CABG, hypertension, hyperlipidemia, IDDM, hypothyroidism, chronic diastolic CHF, bipolar disorder presented with intermittent confusion.   An MRI of the brain showed thalamic strokes with petechial hemorrhage.  Neurology consulted, work-up completed and awaiting SNF bed.  Assessment & Plan:   Acute CVA -imaging showed 14 mm left thalamic lacunar CVA with petechial hemorrhage.   -Neurology has signed off: recommends dual antiplatelet therapy with aspirin and Plavix for 3 weeks, followed by Plavix alone.  Continue statin and ezetimibe.  Outpatient follow-up with neurology -2D echo showed an EF of 60% and grade 2 diastolic dysfunction.  A1c was 9.0, LDL 226.   -Therapy recommends SNF, placement pending   History of coronary artery disease-has a history of CABG.   -Currently chest pain-free   Chronic diastolic CHF  -appears euvolemic on exam.  Continue to monitor.  Strict input and output.  Daily weights.  Fluid restriction.   Essential hypertension -Blood pressure intermittently elevated.  Will possibly resume some of her home meds if blood pressure remains elevated by tomorrow.  Hyperlipidemia-continue statin and ezetimibe    morbid obesity -Outpatient follow-up: She would benefit from weight loss  Chronic kidney disease, stage IIIa-Baseline creatinine around 1.2 to 1.3, creatinine 1.51 today.  Insulin-dependent diabetes mellitus, poorly controlled with hyper and hypoglycemia.   -Continue decreased dose of insulin NPH.  Continue CBGs with SSI.    DVT prophylaxis: Heparin subcutaneous Code Status: Full Family Communication: None at bedside Disposition Plan: Status is: Inpatient  Remains inpatient appropriate because: Of need for  SNF placement. Currently medically stable for discharge  Consultants: Neurology  Procedures: 2D echo  Antimicrobials: None   Subjective: Patient seen and examined at bedside.  Denies any worsening shortness of breath, nausea, vomiting or fever.   Objective: Vitals:   09/20/21 1611 09/20/21 2007 09/21/21 0010 09/21/21 0434  BP: (!) 142/89 (!) 147/72 (!) 146/74 (!) 157/71  Pulse: 83 73 73 84  Resp: 17 20 18 18   Temp: 98.5 F (36.9 C) 98.3 F (36.8 C) 97.9 F (36.6 C) 97.7 F (36.5 C)  TempSrc: Oral Oral Oral Oral  SpO2: 97% 98% 98% 97%  Weight:      Height:        Intake/Output Summary (Last 24 hours) at 09/21/2021 0738 Last data filed at 09/20/2021 2300 Gross per 24 hour  Intake 250 ml  Output --  Net 250 ml    Filed Weights   09/15/21 1632  Weight: 127 kg    Examination:  General exam: No acute distress.  Currently on room air. Respiratory system: Bilateral decreased breath sounds at bases cardiovascular system: S1-S2 heard; currently rate controlled gastrointestinal system: Abdomen is morbidly obese, mildly distended; soft and nontender.  Normal bowel sounds heard extremities: No cyanosis; trace lower extremity edema present  Data Reviewed: I have personally reviewed following labs and imaging studies  CBC: Recent Labs  Lab 09/16/21 0755 09/17/21 0145 09/18/21 0156 09/19/21 0059 09/20/21 0243 09/21/21 0132  WBC 5.5 6.3 6.7 5.3 4.7 5.8  NEUTROABS 3.3  --   --   --   --   --   HGB 13.3 12.5 12.4 12.7 12.4 12.2  HCT 39.5 37.9 36.8 37.9 36.4 36.0  MCV 84.2 83.5 83.3 82.9 83.7 83.3  PLT 236 268 246 236 247 [id number]    Basic Metabolic Panel: Recent Labs  Lab 09/17/21 0145 09/18/21 0156 09/19/21 0059 09/20/21 0243 09/21/21 0132  NA 137 137 138 141 138  K 3.6 4.2 3.7 4.0 3.7  CL 101 104 104 103 101  CO2 28 26 27 30 29   GLUCOSE 84 95 59* 82 187*  BUN 32* 30* 23 22 24*  CREATININE 1.45* 1.47* 1.38* 1.34* 1.51*  CALCIUM 9.1 9.1 9.1 9.1 8.7*  MG 1.9  1.9 1.9 2.1 2.0    GFR: Estimated Creatinine Clearance: 51.6 mL/min (A) (by C-G formula based on SCr of 1.51 mg/dL (H)). Liver Function Tests: Recent Labs  Lab 09/15/21 1651 09/16/21 0755 09/17/21 0145  AST 16 16 13*  ALT 18 15 14   ALKPHOS 82 69 69  BILITOT 0.5 1.0 0.7  PROT 7.6 6.0* 6.1*  ALBUMIN 3.9 3.2* 3.1*    Recent Labs  Lab 09/15/21 1723  LIPASE 40    Recent Labs  Lab 09/15/21 1723  AMMONIA 36*    Coagulation Profile: Recent Labs  Lab 09/15/21 1723  INR 0.9    Cardiac Enzymes: No results for input(s): CKTOTAL, CKMB, CKMBINDEX, TROPONINI in the last 168 hours. BNP (last 3 results) No results for input(s): PROBNP in the last 8760 hours. HbA1C: No results for input(s): HGBA1C in the last 72 hours. CBG: Recent Labs  Lab 09/20/21 0633 09/20/21 1301 09/20/21 1616 09/20/21 2156 09/21/21 0613  GLUCAP 119* 199* 227* 233* 219*    Lipid Profile: No results for input(s): CHOL, HDL, LDLCALC, TRIG, CHOLHDL, LDLDIRECT in the last 72 hours. Thyroid Function Tests: No results for input(s): TSH, T4TOTAL, FREET4, T3FREE, THYROIDAB in the last 72 hours. Anemia Panel: No results for input(s): VITAMINB12, FOLATE, FERRITIN, TIBC, IRON, RETICCTPCT in the last 72 hours. Sepsis Labs: No results for input(s): PROCALCITON, LATICACIDVEN in the last 168 hours.  Recent Results (from the past 240 hour(s))  Resp Panel by RT-PCR (Flu A&B, Covid) Nasopharyngeal Swab     Status: None   Collection Time: 09/15/21  5:23 PM   Specimen: Nasopharyngeal Swab; Nasopharyngeal(NP) swabs in vial transport medium  Result Value Ref Range Status   SARS Coronavirus 2 by RT PCR NEGATIVE NEGATIVE Final    Comment: (NOTE) SARS-CoV-2 target nucleic acids are NOT DETECTED.  The SARS-CoV-2 RNA is generally detectable in upper respiratory specimens during the acute phase of infection. The lowest concentration of SARS-CoV-2 viral copies this assay can detect is 138 copies/mL. A negative  result does not preclude SARS-Cov-2 infection and should not be used as the sole basis for treatment or other patient management decisions. A negative result may occur with  improper specimen collection/handling, submission of specimen other than nasopharyngeal swab, presence of viral mutation(s) within the areas targeted by this assay, and inadequate number of viral copies(<138 copies/mL). A negative result must be combined with clinical observations, patient history, and epidemiological information. The expected result is Negative.  Fact Sheet for Patients:  EntrepreneurPulse.com.au  Fact Sheet for Healthcare Providers:  IncredibleEmployment.be  This test is no t yet approved or cleared by the [place] FDA and  has been authorized for detection and/or diagnosis of SARS-CoV-2 by FDA under an Emergency Use Authorization (EUA). This EUA will remain  in effect (meaning this test can be used) for the duration of the COVID-19 declaration under Section 564(b)(1) of the Act, 21 U.S.C.section 360bbb-3(b)(1), unless the authorization is terminated  or revoked sooner.       Influenza  A by PCR NEGATIVE NEGATIVE Final   Influenza B by PCR NEGATIVE NEGATIVE Final    Comment: (NOTE) The Xpert Xpress SARS-CoV-2/FLU/RSV plus assay is intended as an aid in the diagnosis of influenza from Nasopharyngeal swab specimens and should not be used as a sole basis for treatment. Nasal washings and aspirates are unacceptable for Xpert Xpress SARS-CoV-2/FLU/RSV testing.  Fact Sheet for Patients: BloggerCourse.com  Fact Sheet for Healthcare Providers: SeriousBroker.it  This test is not yet approved or cleared by the [place] FDA and has been authorized for detection and/or diagnosis of SARS-CoV-2 by FDA under an Emergency Use Authorization (EUA). This EUA will remain in effect (meaning this test can be used)  for the duration of the COVID-19 declaration under Section 564(b)(1) of the Act, 21 U.S.C. section 360bbb-3(b)(1), unless the authorization is terminated or revoked.  Performed at [facility], [address] [place], [place] [address]           Radiology Studies: No results found.      Scheduled Meds:  aspirin EC  81 mg Oral Daily   busPIRone  30 mg Oral TID   clopidogrel  75 mg Oral Daily   DULoxetine  60 mg Oral BID   ezetimibe  10 mg Oral Daily   heparin  5,000 Units Subcutaneous Q8H   insulin aspart  0-15 Units Subcutaneous TID AC & HS   insulin aspart protamine- aspart  25 Units Subcutaneous BID WC   levothyroxine  50 mcg Oral QAC breakfast   risperiDONE  2 mg Oral BID   rosuvastatin  40 mg Oral Daily   traZODone  200 mg Oral QHS   Continuous Infusions:        Glade Lloyd, MD [facility] 09/21/2021, 7:38 AM

## 2021-09-21 NOTE — Progress Notes (Signed)
Physical Therapy Treatment Patient Details Name: [name] MRN: [id number] DOB: Feb 13, 1961 Today's Date: 09/21/2021   History of Present Illness Pt is a 60 y.o. female who presented 09/15/21 with AMS and difficulty speaking. Outisde window for [employer]. Imaging revealed subacute 14 mm left thalamic lacunar infarct with petechial hemorrhage, but no malignant hemorrhagic transformation or significant mass effect. 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 PMH: DM2, HTN, HLD, hypothyroidism, Rheumatoid arthritis, diastolic congestive heart failure (Echo 06/2018 EF 65-70% with G2DD), bipolar disorder, obesity    PT Comments    Pt reporting feeling well, states it is taking her longer to do things but overall feels close to baseline mobility. Pt ambulatory around the unit without AD and no physical assist. Pt is at increased risk of falls given slowed gait speed, but overall doing very well. PT feels if pt had intermittent supervision during the day, pt would be more than appropriate for home. CSM to call family and discuss options, pt mobilizing at a HHPT level.     Recommendations for follow up therapy are one component of a multi-disciplinary discharge planning process, led by the attending physician.  Recommendations may be updated based on patient status, additional functional criteria and insurance authorization.  Follow Up Recommendations  Home health PT     Assistance Recommended at Discharge Intermittent Supervision/Assistance  Equipment Recommendations  None recommended by PT    Recommendations for Other Services       Precautions / Restrictions Precautions Precautions: Fall Restrictions Weight Bearing Restrictions: No     Mobility  Bed Mobility Overal bed mobility: Needs Assistance                  Transfers Overall transfer level: Needs assistance Equipment used: None Transfers: Sit to/from Stand Sit to Stand: Supervision            General transfer comment: for safety only; STS x3 from EOB, toilet, and chair in room    Ambulation/Gait Ambulation/Gait assistance: Supervision Gait Distance (Feet): 300 Feet Assistive device: None Gait Pattern/deviations: Step-through pattern;Decreased stride length Gait velocity: decr     General Gait Details: cues for increasing gait speed if possible, no overt LOB   Stairs Stairs: Yes Stairs assistance: Supervision Stair Management: Two rails;Alternating pattern;Forwards Number of Stairs: 4 General stair comments: for safety, use of bilat handrails   Wheelchair Mobility    Modified Rankin (Stroke Patients Only) Modified Rankin (Stroke Patients Only) Pre-Morbid Rankin Score: No symptoms Modified Rankin: Moderate disability     Balance Overall balance assessment: Needs assistance Sitting-balance support: Feet supported Sitting balance-Leahy Scale: Good     Standing balance support: During functional activity;No upper extremity supported Standing balance-Leahy Scale: Fair                   Standardized Balance Assessment Standardized Balance Assessment : Dynamic Gait Index   Dynamic Gait Index Level Surface: Mild Impairment Change in Gait Speed: Mild Impairment Gait with Horizontal Head Turns: Mild Impairment Gait with Vertical Head Turns: Mild Impairment Gait and Pivot Turn: Mild Impairment Step Over Obstacle: Mild Impairment Step Around Obstacles: Mild Impairment Steps: Moderate Impairment Total Score: 15      Cognition Arousal/Alertness: Awake/alert Behavior During Therapy: Flat affect Overall Cognitive Status: Impaired/Different from baseline Area of Impairment: Following commands;Awareness;Problem solving                       Following Commands: Follows multi-step commands  with increased time   Awareness: Emergent Problem Solving: Slow processing General Comments: pt with slightly increased processing time and flat affect,  but follows commands consistently, is A&Ox4        Exercises      General Comments        Pertinent Vitals/Pain Pain Assessment: Faces Faces Pain Scale: Hurts a little bit Pain Location: back (chronic) Pain Descriptors / Indicators: Aching Pain Intervention(s): Limited activity within patient's tolerance;Monitored during session;Repositioned    Home Living                          Prior Function            PT Goals (current goals can now be found in the care plan section) Acute Rehab PT Goals PT Goal Formulation: With patient Time For Goal Achievement: 10/01/21 Potential to Achieve Goals: Good Progress towards PT goals: Progressing toward goals    Frequency    Min 3X/week      PT Plan Discharge plan needs to be updated    Co-evaluation              AM-PAC PT "6 Clicks" Mobility   Outcome Measure  Help needed turning from your back to your side while in a flat bed without using bedrails?: None Help needed moving from lying on your back to sitting on the side of a flat bed without using bedrails?: None Help needed moving to and from a bed to a chair (including a wheelchair)?: None Help needed standing up from a chair using your arms (e.g., wheelchair or bedside chair)?: None Help needed to walk in hospital room?: A Little Help needed climbing 3-5 steps with a railing? : A Little 6 Click Score: 22    End of Session Equipment Utilized During Treatment: Gait belt Activity Tolerance: Patient tolerated treatment well Patient left: in chair;with call bell/phone within reach;with chair alarm set Nurse Communication: Mobility status PT Visit Diagnosis: Other abnormalities of gait and mobility (R26.89)     Time: 7628-3151 PT Time Calculation (min) (ACUTE ONLY): 20 min  Charges:  $Gait Training: 8-22 mins                    Marye Round, PT DPT Acute Rehabilitation Services Pager [phone]  Office [phone]    Tyrone Apple E Christain [place] 09/21/2021, 12:58  PM

## 2021-09-21 NOTE — TOC Progression Note (Signed)
Transition of Care [facility]) - Progression Note    Patient Details  Name: [name] MRN: [id number] Date of Birth: 07-21-1961  [facility] of [facility]) CM/SW Contact  Huston Foley Jacklynn Ganong, RN Phone Number: 09/21/2021, 1:42 PM  Clinical Narrative:   Patient's daughter  [name] informed Case Manager that she is waiting to setup Home Health,she is calling [employer] to file appeal.     Expected Discharge Plan: Skilled Nursing Facility Barriers to Discharge: Continued Medical Work up  Expected Discharge Plan and Services Expected Discharge Plan: Skilled Nursing Facility In-house Referral: Clinical Social Work Discharge Planning Services: CM Consult Post Acute Care Choice: Skilled Nursing Facility Living arrangements for the past 2 months: Single Family Home Expected Discharge Date: 09/21/21                                     Social Determinants of Health (SDOH) Interventions    Readmission Risk Interventions No flowsheet data found.

## 2021-09-21 NOTE — Discharge Summary (Signed)
Physician Discharge Summary  [name] ZOX:[id number] DOB: 08-03-1961 DOA: 09/15/2021  PCP: Christain Sacramento, MD  Admit date: 09/15/2021 Discharge date: 09/21/2021  Admitted From: Home Disposition: Home  Recommendations for Outpatient Follow-up:  Follow up with PCP in 1 week with repeat CBC/BMP Outpatient follow-up with neurology Follow up in ED if symptoms worsen or new appear   Home Health: Home health PT Equipment/Devices: None  Discharge Condition: Stable CODE STATUS: Full Diet recommendation: Heart healthy/carb modified  Brief/Interim Summary: 60 year old female with history of CAD/CABG, hypertension, hyperlipidemia, IDDM, hypothyroidism, chronic diastolic CHF, bipolar disorder presented with intermittent confusion.   An MRI of the brain showed thalamic strokes with petechial hemorrhage.  Neurology consulted, work-up completed and awaiting SNF bed.  Subsequently, patient's condition is improved and PT is now recommending home health PT.  She will be discharged home today with home health PT with outpatient follow-up with neurology.  Discharge Diagnoses:   Acute CVA -imaging showed 14 mm left thalamic lacunar CVA with petechial hemorrhage.   -Neurology has signed off: recommends dual antiplatelet therapy with aspirin and Plavix for 3 weeks, followed by Plavix alone.  Continue statin and ezetimibe.  Outpatient follow-up with neurology -2D echo showed an EF of 60% and grade 2 diastolic dysfunction.  A1c was 9.0, LDL 226.   -Therapy initially recommended SNF - Subsequently, patient's condition is improved and PT is now recommending home health PT.  She will be discharged home today with home health PT with outpatient follow-up with neurology.   History of coronary artery disease-has a history of CABG.   -Currently chest pain-free.  Outpatient follow-up with cardiology.   Chronic diastolic CHF  -appears euvolemic on exam.  Continue fluid and salt  restriction. -Daughter/Carmen on phone stated that she is not aware of her mother's diagnosis of CHF.  Patient apparently is on metoprolol and Lasix at home.  I have advised Asencion Partridge to speak to PCP regarding clarification of this diagnosis.  Essential hypertension -Treated with permissive hypertension for 3 to 5 days.  Blood pressure intermittently elevated.  Resume home regimen except for Lasix upon discharge.  Outpatient follow-up with PCP.  Hyperlipidemia-continue statin and ezetimibe    morbid obesity -Outpatient follow-up: She would benefit from weight loss  Chronic kidney disease, stage IIIa-Baseline creatinine around 1.2 to 1.3, creatinine 1.51 today.  Outpatient follow-up.  Insulin-dependent diabetes mellitus, poorly controlled with hyper and hypoglycemia.   -Continue decreased dose of insulin NPH.  Carb modified diet.  Outpatient follow-up.  Discharge Instructions  Discharge Instructions     Ambulatory referral to Neurology   Complete by: As directed    An appointment is requested in approximately: 2 weeks   Diet - low sodium heart healthy   Complete by: As directed    Diet Carb Modified   Complete by: As directed    Increase activity slowly   Complete by: As directed       Allergies as of 09/21/2021       Reactions   Amoxicillin Swelling, Other (See Comments)   Oral swelling   Doxycycline Nausea And Vomiting   Levofloxacin Other (See Comments)   headache   Propoxyphene N-acetaminophen Nausea And Vomiting        Medication List     STOP taking these medications    furosemide 20 MG tablet Commonly known as: LASIX   Potassium Chloride ER 20 MEQ Tbcr       TAKE these medications    Accu-Chek Guide test strip Generic drug:  glucose blood USE TO TEST BLOOD SUGAR 4 TIMES DAILY.   albuterol 108 (90 Base) MCG/ACT inhaler Commonly known as: VENTOLIN HFA Inhale 2 puffs into the lungs every 6 (six) hours as needed for wheezing or shortness of breath.    amLODipine 10 MG tablet Commonly known as: NORVASC Take 1 tablet (10 mg total) by mouth daily. For high blood pressure   aspirin EC 81 MG tablet Take 1 tablet (81 mg total) by mouth daily. Swallow whole.   BD Pen Needle Nano U/F 32G X 4 MM Misc Generic drug: Insulin Pen Needle USE AS DIRECTED TWICE DAILY   blood glucose meter kit and supplies Kit 1 each by Does not apply route 4 (four) times daily. Dispense based on patient and insurance preference. Use up to four times daily as directed. (FOR ICD-9 250.00, 250.01).   busPIRone 30 MG tablet Commonly known as: BUSPAR Take 1 tablet (30 mg total) by mouth 3 (three) times daily.   clopidogrel 75 MG tablet Commonly known as: PLAVIX Take 1 tablet (75 mg total) by mouth daily. Start taking on: September 22, 2021   DULoxetine 60 MG capsule Commonly known as: Cymbalta Take 1 capsule (60 mg total) by mouth 2 (two) times daily.   ezetimibe 10 MG tablet Commonly known as: ZETIA Take 1 tablet (10 mg total) by mouth daily. Start taking on: September 22, 2021   glipiZIDE 10 MG tablet Commonly known as: GLUCOTROL Take 1 tablet (10 mg total) by mouth 2 (two) times daily before a meal.   hydrOXYzine 25 MG capsule Commonly known as: VISTARIL Take 1 capsule (25 mg total) by mouth 3 (three) times daily as needed.   levothyroxine 50 MCG tablet Commonly known as: SYNTHROID Take 1 tablet (50 mcg total) by mouth daily before breakfast. For hypothyroidism   metoprolol succinate 25 MG 24 hr tablet Commonly known as: TOPROL-XL TAKE 1 TABLET EVERY DAY (NEED CARDIOLOGY APPOINTMENT FOR REFILLS) What changed: See the new instructions.   nitroGLYCERIN 0.4 MG SL tablet Commonly known as: NITROSTAT Place 1 tablet (0.4 mg total) under the tongue every 5 (five) minutes as needed for chest pain.   NovoLIN 70/30 Kwikpen (70-30) 100 UNIT/ML KwikPen Generic drug: insulin isophane & regular human KwikPen Inject 25 Units into the skin 2 (two) times daily  before lunch and supper. What changed: how much to take   risperiDONE 2 MG tablet Commonly known as: RisperDAL Take 1 tablet (2 mg total) by mouth 2 (two) times daily.   rizatriptan 5 MG tablet Commonly known as: MAXALT Take 5 mg by mouth daily as needed for migraine.   rosuvastatin 40 MG tablet Commonly known as: Crestor Take 1 tablet (40 mg total) by mouth daily.   traZODone 100 MG tablet Commonly known as: DESYREL Take 0.5 tablets (50 mg total) by mouth at bedtime.   Vitamin D (Ergocalciferol) 1.25 MG (50000 UNIT) Caps capsule Commonly known as: DRISDOL Take 1 capsule (50,000 Units total) by mouth every 7 (seven) days.   Vitamin D3 125 MCG (5000 UT) Caps Take 1 capsule (5,000 Units total) by mouth daily.        Follow-up Information     Christain Sacramento, MD. Schedule an appointment as soon as possible for a visit in 1 week(s).   Specialty: Family Medicine Contact information: [address] [place] [address]         Herminio Commons, MD .   Specialty: Cardiology Contact information: [place]  [place] [address] [phone]                Allergies  Allergen Reactions   Amoxicillin Swelling and Other (See Comments)    Oral swelling    Doxycycline Nausea And Vomiting   Levofloxacin Other (See Comments)    headache   Propoxyphene N-Acetaminophen Nausea And Vomiting    Consultations: Neurology   Procedures/Studies: CT ANGIO HEAD NECK W WO CM  Result Date: 09/16/2021 CLINICAL DATA:  There is a 6 mm acute, stroke suspected. Assess intracranial arteries, altered mental status. EXAM: CT ANGIOGRAPHY HEAD AND NECK TECHNIQUE: Multidetector CT imaging of the head and neck was performed using the standard protocol during bolus administration of intravenous contrast. Multiplanar CT image reconstructions and MIPs were obtained to evaluate the vascular anatomy. Carotid stenosis measurements (when applicable) are obtained utilizing NASCET  criteria, using the distal internal carotid diameter as the denominator. CONTRAST:  72m OMNIPAQUE IOHEXOL 350 MG/ML SOLN COMPARISON:  Brain MRI 09/26/2021. Head CT 09/15/2021. FINDINGS: CT HEAD FINDINGS Brain: Cerebral volume appears normal for age. Unchanged size of a 14 mm acute infarct within the ventral left thalamus. Petechial hemorrhage at this site was better appreciated on the brain MRI performed earlier today. No extra-axial fluid collection. No evidence of an intracranial mass. No midline shift. Vascular: No hyperdense vessel.  Atherosclerotic calcifications. Skull: Normal. Negative for fracture or focal lesion. Orbits: No acute or significant orbital finding. Sinuses: Mild mucosal thickening within the bilateral maxillary sinuses. Review of the MIP images confirms the above findings CTA NECK FINDINGS Aortic arch: Common origin of the innominate and left common carotid arteries. Minimal atherosclerotic plaque within the visualized aortic arch and proximal major branch vessels of the neck. No hemodynamically significant innominate or proximal subclavian artery stenosis. Right carotid system: CCA and ICA patent within the neck. Some calcified atherosclerotic plaque about the carotid bifurcation and within the proximal ICA. Stenosis of the proximal ICA estimated at 50%. Medial course of the CCA. Tortuosity of the cervical ICA. Left carotid system: CCA and ICA patent within the neck. Soft and calcified plaque about the carotid bifurcation and within the proximal ICA. Stenosis of the proximal ICA estimated at 60-70%. Medial course of the CCA. Tortuosity of the cervical ICA Vertebral arteries: Vertebral arteries codominant and patent within the neck without stenosis. Nonstenotic calcified plaque at the origin of the right vertebral artery. Skeleton: Reversal of the expected cervical lordosis. Cervical spondylosis. No acute bony abnormality or aggressive osseous lesion Other neck: No neck mass or cervical  lymphadenopathy. Thyroid unremarkable. Upper chest: No consolidation within the imaged lung apices. Prior median sternotomy. Review of the MIP images confirms the above findings CTA HEAD FINDINGS Anterior circulation: The intracranial internal carotid arteries are patent. Calcified plaque within both vessels with no more than mild stenosis. The M1 middle cerebral arteries are patent. No M2 proximal branch occlusion is identified. Atherosclerotic irregularity of the M2 and more distal middle cerebral artery vessels, bilaterally. Most notably, there are sites of moderate/severe stenosis within mid M2 MCA vessels, bilaterally. The anterior cerebral arteries are patent. No intracranial aneurysm is identified. Posterior circulation: The intracranial vertebral arteries are patent. Mild atherosclerotic narrowing of the V4 left vertebral artery. The basilar artery is patent. The posterior cerebral arteries are patent. Atherosclerotic irregularity of both vessels without high-grade proximal stenosis. Posterior communicating arteries are diminutive or absent bilaterally. Venous sinuses: Within the limitations of contrast timing, no convincing thrombus. Anatomic variants: As described. Review of the MIP images confirms the  above findings IMPRESSION: CT head: Redemonstrated 14 mm acute infarct in the left thalamus. Petechial hemorrhage at this site was better appreciated on the brain MRI performed earlier today. CTA neck: 1. The common carotid and internal carotid arteries are patent within the neck. Estimated atherosclerotic stenosis of the proximal right ICA of 50%. Estimated atherosclerotic stenosis of the proximal left ICA of 60-70%. Consider a carotid artery duplex for further quantification of the stenosis of the proximal left ICA. 2. Vertebral arteries patent within the neck without stenosis. 3.  Aortic Atherosclerosis (ICD10-I70.0). CTA head: 1. No intracranial large vessel occlusion. 2. Intracranial atherosclerotic  disease, as outlined. Most notably, there are sites of moderate/severe stenosis within mid M2 MCA vessels, bilaterally. Electronically Signed   By: Kellie Simmering D.O.   On: 09/16/2021 07:51   CT Head Wo Contrast  Result Date: 09/15/2021 CLINICAL DATA:  Confusion, last seen normal last night, abnormal mental status EXAM: CT HEAD WITHOUT CONTRAST TECHNIQUE: Contiguous axial images were obtained from the base of the skull through the vertex without intravenous contrast. COMPARISON:  07/13/2017 FINDINGS: Brain: Normal ventricular morphology. No midline shift or mass effect. Age-indeterminate LEFT thalamic infarct new since 2018, favor at least subacute. No intracranial hemorrhage, mass lesion, or fluid collections. Additional infarction. No extra-axial Vascular: No hyperdense vessels. Atherosclerotic calcifications of internal carotid arteries at skull base Skull: Intact Sinuses/Orbits: Clear Other: N/A IMPRESSION: Age-indeterminate LEFT thalamic infarct new since 2018, favor at least subacute in age. No other intracranial abnormalities. Electronically Signed   By: Lavonia Dana M.D.   On: 09/15/2021 17:27   MR BRAIN WO CONTRAST  Result Date: 09/16/2021 CLINICAL DATA:  60 year old female with confusion. Abnormal mental status. Age indeterminate left thalamic infarct on head CT yesterday. EXAM: MRI HEAD WITHOUT CONTRAST TECHNIQUE: Multiplanar, multiecho pulse sequences of the brain and surrounding structures were obtained without intravenous contrast. COMPARISON:  Head CT 09/15/2021, 07/13/2017. FINDINGS: Brain: Oval 14 mm area of intense abnormal trace diffusion signal in the left thalamus corresponding to the CT finding. This appears heterogeneously restricted on ADC. Confluent associated T2 and FLAIR hyperintensity with decreased T1 signal. SWI indicates associated microhemorrhage, but there is no malignant hemorrhagic transformation or significant mass effect. No other restricted diffusion. No midline shift,  mass effect, evidence of mass lesion, ventriculomegaly, extra-axial collection or acute intracranial hemorrhage. Cervicomedullary junction and pituitary are within normal limits. Outside of the left thalamus Pearline Cables and white matter signal is within normal limits for age throughout the brain. No cortical encephalomalacia. No other cerebral blood products identified on SWI. Basal ganglia, brainstem and cerebellum are within normal limits. Vascular: Major intracranial vascular flow voids are preserved. Skull and upper cervical spine: Negative visible cervical spine. Visualized bone marrow signal is within normal limits. Sinuses/Orbits: Negative orbits. Mild bilateral paranasal sinus mucosal thickening. Other: Mastoids are well aerated. Grossly normal visible internal auditory structures. Negative visible scalp and face. IMPRESSION: 1. Subacute 14 mm left thalamic lacunar infarct with petechial hemorrhage, but no malignant hemorrhagic transformation or significant mass effect. 2. No other acute intracranial abnormality, elsewhere normal for age noncontrast MRI appearance of the brain. Electronically Signed   By: Genevie Ann M.D.   On: 09/16/2021 04:13   DG Chest Portable 1 View  Result Date: 09/15/2021 CLINICAL DATA:  Altered mental status, confusion EXAM: PORTABLE CHEST 1 VIEW COMPARISON:  Portable exam 1713 hours compared to 07/24/2018 FINDINGS: Borderline enlargement of cardiac silhouette post median sternotomy. Mediastinal contours and pulmonary vascularity normal. Lungs clear. No infiltrate, pleural effusion  or pneumothorax. IMPRESSION: No acute abnormalities. Electronically Signed   By: Lavonia Dana M.D.   On: 09/15/2021 17:28   ECHOCARDIOGRAM COMPLETE BUBBLE STUDY  Result Date: 09/16/2021    ECHOCARDIOGRAM REPORT   Patient Name:   TENSLEY WERY Date of Exam: 09/16/2021 Medical Rec #:  [id number]         Height:       64.0 in Accession #:    [id number]        Weight:       280.0 lb Date of Birth:  04-Sep-1961          BSA:          2.257 m Patient Age:    47 years          BP:           104/74 mmHg Patient Gender: F                 HR:           63 bpm. Exam Location:  Inpatient Procedure: 2D Echo and Saline Contrast Bubble Study Indications:    stroke  History:        Patient has prior history of Echocardiogram examinations, most                 recent 07/17/2018. CHF, CAD, Prior CABG; Risk                 Factors:Hypertension, Dyslipidemia and Diabetes.  Sonographer:    Johny Chess RDCS Referring Phys: [id number] [place]  1. Left ventricular ejection fraction, by estimation, is 60 to 65%. The left ventricle has normal function. The left ventricle has no regional wall motion abnormalities. Left ventricular diastolic parameters are consistent with Grade II diastolic dysfunction (pseudonormalization). Elevated left atrial pressure.  2. Right ventricular systolic function is normal. The right ventricular size is normal. Tricuspid regurgitation signal is inadequate for assessing PA pressure.  3. Left atrial size was mildly dilated.  4. The mitral valve is normal in structure. No evidence of mitral valve regurgitation.  5. The aortic valve is tricuspid. There is mild calcification of the aortic valve. There is mild thickening of the aortic valve. Aortic valve regurgitation is not visualized. Aortic valve sclerosis is present, with no evidence of aortic valve stenosis.  6. Agitated saline contrast bubble study was negative, with no evidence of any interatrial shunt. FINDINGS  Left Ventricle: Left ventricular ejection fraction, by estimation, is 60 to 65%. The left ventricle has normal function. The left ventricle has no regional wall motion abnormalities. The left ventricular internal cavity size was normal in size. There is  borderline concentric left ventricular hypertrophy. Left ventricular diastolic parameters are consistent with Grade II diastolic dysfunction (pseudonormalization). Elevated left  atrial pressure. Right Ventricle: The right ventricular size is normal. No increase in right ventricular wall thickness. Right ventricular systolic function is normal. Tricuspid regurgitation signal is inadequate for assessing PA pressure. Left Atrium: Left atrial size was mildly dilated. Right Atrium: Right atrial size was normal in size. Pericardium: There is no evidence of pericardial effusion. Mitral Valve: The mitral valve is normal in structure. Mild mitral annular calcification. No evidence of mitral valve regurgitation. Tricuspid Valve: The tricuspid valve is normal in structure. Tricuspid valve regurgitation is not demonstrated. Aortic Valve: The aortic valve is tricuspid. There is mild calcification of the aortic valve. There is mild thickening of the aortic valve. Aortic valve regurgitation is not visualized. Aortic  valve sclerosis is present, with no evidence of aortic valve stenosis. Pulmonic Valve: The pulmonic valve was not well visualized. Pulmonic valve regurgitation is not visualized. Aorta: The aortic root and ascending aorta are structurally normal, with no evidence of dilitation. IAS/Shunts: No atrial level shunt detected by color flow Doppler. Agitated saline contrast was given intravenously to evaluate for intracardiac shunting. Agitated saline contrast bubble study was negative, with no evidence of any interatrial shunt.  LEFT VENTRICLE PLAX 2D LVIDd:         4.70 cm   Diastology LVIDs:         3.10 cm   LV e' medial:    5.00 cm/s LV PW:         1.10 cm   LV E/e' medial:  18.0 LV IVS:        1.20 cm   LV e' lateral:   5.50 cm/s LVOT diam:     1.80 cm   LV E/e' lateral: 16.4 LV SV:         57 LV SV Index:   25 LVOT Area:     2.54 cm  RIGHT VENTRICLE             IVC RV S prime:     10.00 cm/s  IVC diam: 1.50 cm TAPSE (M-mode): 2.1 cm LEFT ATRIUM             Index        RIGHT ATRIUM           Index LA diam:        4.20 cm 1.86 cm/m   RA Area:     16.90 cm LA Vol (A2C):   51.6 ml 22.86 ml/m   RA Volume:   46.00 ml  20.38 ml/m LA Vol (A4C):   59.9 ml 26.54 ml/m LA Biplane Vol: 56.6 ml 25.08 ml/m  AORTIC VALVE LVOT Vmax:   95.60 cm/s LVOT Vmean:  66.600 cm/s LVOT VTI:    0.224 m  AORTA Ao Root diam: 2.60 cm Ao Asc diam:  3.30 cm MITRAL VALVE MV Area (PHT): 3.48 cm    SHUNTS MV Decel Time: 218 msec    Systemic VTI:  0.22 m MV E velocity: 90.00 cm/s  Systemic Diam: 1.80 cm MV A velocity: 72.40 cm/s MV E/A ratio:  1.24 Mihai Croitoru MD Electronically signed by Sanda Klein MD Signature Date/Time: 09/16/2021/1:08:26 PM    Final       Subjective: Patient seen and examined at bedside.  Denies any worsening shortness of breath, nausea, vomiting or fever.    Discharge Exam: Vitals:   09/21/21 0805 09/21/21 1113  BP: 134/74 130/82  Pulse: 82 76  Resp: 16 20  Temp: 97.8 F (36.6 C) 97.8 F (36.6 C)  SpO2: 95% 97%   General exam: No acute distress.  Currently on room air. Respiratory system: Bilateral decreased breath sounds at bases cardiovascular system: S1-S2 heard; currently rate controlled gastrointestinal system: Abdomen is morbidly obese, mildly distended; soft and nontender.  Normal bowel sounds heard extremities: No cyanosis; trace lower extremity edema present    The results of significant diagnostics from this hospitalization (including imaging, microbiology, ancillary and laboratory) are listed below for reference.     Microbiology: Recent Results (from the past 240 hour(s))  Resp Panel by RT-PCR (Flu A&B, Covid) Nasopharyngeal Swab     Status: None   Collection Time: 09/15/21  5:23 PM   Specimen: Nasopharyngeal Swab; Nasopharyngeal(NP) swabs in vial transport medium  Result Value Ref Range Status   SARS Coronavirus 2 by RT PCR NEGATIVE NEGATIVE Final    Comment: (NOTE) SARS-CoV-2 target nucleic acids are NOT DETECTED.  The SARS-CoV-2 RNA is generally detectable in upper respiratory specimens during the acute phase of infection. The lowest concentration of  SARS-CoV-2 viral copies this assay can detect is 138 copies/mL. A negative result does not preclude SARS-Cov-2 infection and should not be used as the sole basis for treatment or other patient management decisions. A negative result may occur with  improper specimen collection/handling, submission of specimen other than nasopharyngeal swab, presence of viral mutation(s) within the areas targeted by this assay, and inadequate number of viral copies(<138 copies/mL). A negative result must be combined with clinical observations, patient history, and epidemiological information. The expected result is Negative.  Fact Sheet for Patients:  EntrepreneurPulse.com.au  Fact Sheet for Healthcare Providers:  IncredibleEmployment.be  This test is no t yet approved or cleared by the [place] FDA and  has been authorized for detection and/or diagnosis of SARS-CoV-2 by FDA under an Emergency Use Authorization (EUA). This EUA will remain  in effect (meaning this test can be used) for the duration of the COVID-19 declaration under Section 564(b)(1) of the Act, 21 U.S.C.section 360bbb-3(b)(1), unless the authorization is terminated  or revoked sooner.       Influenza A by PCR NEGATIVE NEGATIVE Final   Influenza B by PCR NEGATIVE NEGATIVE Final    Comment: (NOTE) The Xpert Xpress SARS-CoV-2/FLU/RSV plus assay is intended as an aid in the diagnosis of influenza from Nasopharyngeal swab specimens and should not be used as a sole basis for treatment. Nasal washings and aspirates are unacceptable for Xpert Xpress SARS-CoV-2/FLU/RSV testing.  Fact Sheet for Patients: EntrepreneurPulse.com.au  Fact Sheet for Healthcare Providers: IncredibleEmployment.be  This test is not yet approved or cleared by the [place] FDA and has been authorized for detection and/or diagnosis of SARS-CoV-2 by FDA under an Emergency Use  Authorization (EUA). This EUA will remain in effect (meaning this test can be used) for the duration of the COVID-19 declaration under Section 564(b)(1) of the Act, 21 U.S.C. section 360bbb-3(b)(1), unless the authorization is terminated or revoked.  Performed at [facility], [address] [place], [place] [address]      Labs: BNP (last 3 results) Recent Labs    09/15/21 1723  BNP 28.1   Basic Metabolic Panel: Recent Labs  Lab 09/17/21 0145 09/18/21 0156 09/19/21 0059 09/20/21 0243 09/21/21 0132  NA 137 137 138 141 138  K 3.6 4.2 3.7 4.0 3.7  CL 101 104 104 103 101  CO2 28 26 27 30 29   GLUCOSE 84 95 59* 82 187*  BUN 32* 30* 23 22 24*  CREATININE 1.45* 1.47* 1.38* 1.34* 1.51*  CALCIUM 9.1 9.1 9.1 9.1 8.7*  MG 1.9 1.9 1.9 2.1 2.0   Liver Function Tests: Recent Labs  Lab 09/15/21 1651 09/16/21 0755 09/17/21 0145  AST 16 16 13*  ALT 18 15 14   ALKPHOS 82 69 69  BILITOT 0.5 1.0 0.7  PROT 7.6 6.0* 6.1*  ALBUMIN 3.9 3.2* 3.1*   Recent Labs  Lab 09/15/21 1723  LIPASE 40   Recent Labs  Lab 09/15/21 1723  AMMONIA 36*   CBC: Recent Labs  Lab 09/16/21 0755 09/17/21 0145 09/18/21 0156 09/19/21 0059 09/20/21 0243 09/21/21 0132  WBC 5.5 6.3 6.7 5.3 4.7 5.8  NEUTROABS 3.3  --   --   --   --   --  HGB 13.3 12.5 12.4 12.7 12.4 12.2  HCT 39.5 37.9 36.8 37.9 36.4 36.0  MCV 84.2 83.5 83.3 82.9 83.7 83.3  PLT 236 268 246 236 247 248   Cardiac Enzymes: No results for input(s): CKTOTAL, CKMB, CKMBINDEX, TROPONINI in the last 168 hours. BNP: Invalid input(s): POCBNP CBG: Recent Labs  Lab 09/20/21 1301 09/20/21 1616 09/20/21 2156 09/21/21 0613 09/21/21 1120  GLUCAP 199* 227* 233* 219* 205*   D-Dimer No results for input(s): DDIMER in the last 72 hours. Hgb A1c No results for input(s): HGBA1C in the last 72 hours. Lipid Profile No results for input(s): CHOL, HDL, LDLCALC, TRIG, CHOLHDL, LDLDIRECT in the last 72 hours. Thyroid function  studies No results for input(s): TSH, T4TOTAL, T3FREE, THYROIDAB in the last 72 hours.  Invalid input(s): FREET3 Anemia work up No results for input(s): VITAMINB12, FOLATE, FERRITIN, TIBC, IRON, RETICCTPCT in the last 72 hours. Urinalysis    Component Value Date/Time   COLORURINE YELLOW 07/03/2018 1435   APPEARANCEUR HAZY (A) 07/03/2018 1435   LABSPEC 1.027 07/03/2018 1435   PHURINE 5.0 07/03/2018 1435   GLUCOSEU >=500 (A) 07/03/2018 1435   HGBUR NEGATIVE 07/03/2018 1435   BILIRUBINUR NEGATIVE 07/03/2018 1435   KETONESUR NEGATIVE 07/03/2018 1435   PROTEINUR NEGATIVE 07/03/2018 1435   UROBILINOGEN 0.2 09/09/2010 0130   NITRITE NEGATIVE 07/03/2018 1435   LEUKOCYTESUR NEGATIVE 07/03/2018 1435   Sepsis Labs Invalid input(s): PROCALCITONIN,  WBC,  LACTICIDVEN Microbiology Recent Results (from the past 240 hour(s))  Resp Panel by RT-PCR (Flu A&B, Covid) Nasopharyngeal Swab     Status: None   Collection Time: 09/15/21  5:23 PM   Specimen: Nasopharyngeal Swab; Nasopharyngeal(NP) swabs in vial transport medium  Result Value Ref Range Status   SARS Coronavirus 2 by RT PCR NEGATIVE NEGATIVE Final    Comment: (NOTE) SARS-CoV-2 target nucleic acids are NOT DETECTED.  The SARS-CoV-2 RNA is generally detectable in upper respiratory specimens during the acute phase of infection. The lowest concentration of SARS-CoV-2 viral copies this assay can detect is 138 copies/mL. A negative result does not preclude SARS-Cov-2 infection and should not be used as the sole basis for treatment or other patient management decisions. A negative result may occur with  improper specimen collection/handling, submission of specimen other than nasopharyngeal swab, presence of viral mutation(s) within the areas targeted by this assay, and inadequate number of viral copies(<138 copies/mL). A negative result must be combined with clinical observations, patient history, and epidemiological information. The  expected result is Negative.  Fact Sheet for Patients:  EntrepreneurPulse.com.au  Fact Sheet for Healthcare Providers:  IncredibleEmployment.be  This test is no t yet approved or cleared by the [place] FDA and  has been authorized for detection and/or diagnosis of SARS-CoV-2 by FDA under an Emergency Use Authorization (EUA). This EUA will remain  in effect (meaning this test can be used) for the duration of the COVID-19 declaration under Section 564(b)(1) of the Act, 21 U.S.C.section 360bbb-3(b)(1), unless the authorization is terminated  or revoked sooner.       Influenza A by PCR NEGATIVE NEGATIVE Final   Influenza B by PCR NEGATIVE NEGATIVE Final    Comment: (NOTE) The Xpert Xpress SARS-CoV-2/FLU/RSV plus assay is intended as an aid in the diagnosis of influenza from Nasopharyngeal swab specimens and should not be used as a sole basis for treatment. Nasal washings and aspirates are unacceptable for Xpert Xpress SARS-CoV-2/FLU/RSV testing.  Fact Sheet for Patients: EntrepreneurPulse.com.au  Fact Sheet for Healthcare Providers: IncredibleEmployment.be  This  test is not yet approved or cleared by the [place] and has been authorized for detection and/or diagnosis of SARS-CoV-2 by FDA under an Emergency Use Authorization (EUA). This EUA will remain in effect (meaning this test can be used) for the duration of the COVID-19 declaration under Section 564(b)(1) of the Act, 21 U.S.C. section 360bbb-3(b)(1), unless the authorization is terminated or revoked.  Performed at [facility], [address], [place], [place] [address]      Time coordinating discharge: 35 minutes  SIGNED:   Aline August, MD  [facility] 09/21/2021, 1:14 PM

## 2021-09-22 DIAGNOSIS — R4701 Aphasia: Secondary | ICD-10-CM | POA: Diagnosis not present

## 2021-09-22 DIAGNOSIS — I6381 Other cerebral infarction due to occlusion or stenosis of small artery: Secondary | ICD-10-CM | POA: Diagnosis not present

## 2021-09-22 LAB — CBC
HCT: 34.9 % — ABNORMAL LOW (ref 36.0–46.0)
Hemoglobin: 12 g/dL (ref 12.0–15.0)
MCH: 28.4 pg (ref 26.0–34.0)
MCHC: 34.4 g/dL (ref 30.0–36.0)
MCV: 82.7 fL (ref 80.0–100.0)
Platelets: 228 10*3/uL (ref 150–400)
RBC: 4.22 MIL/uL (ref 3.87–5.11)
RDW: 12.5 % (ref 11.5–15.5)
WBC: 5.1 10*3/uL (ref 4.0–10.5)
nRBC: 0 % (ref 0.0–0.2)

## 2021-09-22 LAB — GLUCOSE, CAPILLARY
Glucose-Capillary: 136 mg/dL — ABNORMAL HIGH (ref 70–99)
Glucose-Capillary: 202 mg/dL — ABNORMAL HIGH (ref 70–99)
Glucose-Capillary: 254 mg/dL — ABNORMAL HIGH (ref 70–99)
Glucose-Capillary: 209 mg/dL — ABNORMAL HIGH (ref 70–99)

## 2021-09-22 LAB — BASIC METABOLIC PANEL
Anion gap: 6 (ref 5–15)
BUN: 24 mg/dL — ABNORMAL HIGH (ref 8–23)
CO2: 28 mmol/L (ref 22–32)
Calcium: 8.8 mg/dL — ABNORMAL LOW (ref 8.9–10.3)
Chloride: 103 mmol/L (ref 98–111)
Creatinine, Ser: 1.44 mg/dL — ABNORMAL HIGH (ref 0.44–1.00)
GFR, Estimated: 41 mL/min — ABNORMAL LOW (ref >60)
Glucose, Bld: 132 mg/dL — ABNORMAL HIGH (ref 70–99)
Potassium: 3.8 mmol/L (ref 3.5–5.1)
Sodium: 137 mmol/L (ref 135–145)

## 2021-09-22 LAB — MAGNESIUM: Magnesium: 2 mg/dL (ref 1.7–2.4)

## 2021-09-22 MED ORDER — SENNOSIDES-DOCUSATE SODIUM 8.6-50 MG PO TABS
1.0000 | ORAL_TABLET | Freq: Two times a day (BID) | ORAL | Status: DC
  Administered 2021-09-22 – 2021-09-23 (×3): 1 via ORAL
  Filled 2021-09-22 (×3): qty 1

## 2021-09-22 MED ORDER — BISACODYL 10 MG RE SUPP
10.0000 mg | Freq: Every day | RECTAL | Status: DC | PRN
  Administered 2021-09-22: 22:00:00 10 mg via RECTAL
  Filled 2021-09-22: qty 1

## 2021-09-22 NOTE — Progress Notes (Signed)
Occupational Therapy Treatment Patient Details Name: [name] MRN: [id number] DOB: 1961-09-21 Today's Date: 09/22/2021   History of present illness Pt is a 60 y.o. female who presented 09/15/21 with AMS and difficulty speaking. Outisde window for [employer]. Imaging revealed subacute 14 mm left thalamic lacunar infarct with petechial hemorrhage, but no malignant hemorrhagic transformation or significant mass effect. 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 PMH: DM2, HTN, HLD, hypothyroidism, Rheumatoid arthritis, diastolic congestive heart failure (Echo 06/2018 EF 65-70% with G2DD), bipolar disorder, obesity   OT comments  Gayatri is progressing well, she has met 5/6 of her acute care goals. Upon arrival pt was sitting EOB eating at an indep level. She was instructed to complete 3 grooming/ADL tasks at the sink in a specific order. Pt was able to sequence all tasks independently with only 1 verbal cue for compensatory technique for back pain management. Overall she completed oral hygiene, LB bathing and face washing with mod I for increased time. All functional ambulation within the room was also mod I without use of AD. Pt continues to have complaints of RUE "tingling," which is her main/only complaint. Pt shared that at baseline her family assist with medication management, finance management and meals/grocery shopping. D/c recommendation updated to home with [facility] and intermittent supervision from family.    Recommendations for follow up therapy are one component of a multi-disciplinary discharge planning process, led by the attending physician.  Recommendations may be updated based on patient status, additional functional criteria and insurance authorization.    Follow Up Recommendations  Home health OT    Assistance Recommended at Discharge Intermittent Supervision/Assistance  Equipment Recommendations  Tub/shower bench       Precautions / Restrictions  Precautions Precautions: Fall Restrictions Weight Bearing Restrictions: No       Mobility Bed Mobility Overal bed mobility: Needs Assistance             General bed mobility comments: sitting EOB upon arrival.    Transfers Overall transfer level: Needs assistance Equipment used: None Transfers: Sit to/from Stand Sit to Stand: Modified independent (Device/Increase time)                 Balance Overall balance assessment: Needs assistance Sitting-balance support: Feet supported Sitting balance-Leahy Scale: Good     Standing balance support: No upper extremity supported;During functional activity Standing balance-Leahy Scale: Fair                             ADL either performed or assessed with clinical judgement   ADL Overall ADL's : Needs assistance/impaired Eating/Feeding: Independent;Sitting Eating/Feeding Details (indicate cue type and reason): sitting EOB, eating upon arrival Grooming: Modified independent;Standing Grooming Details (indicate cue type and reason): standing and sitting at the sink (sitting due to chronic back pain)     Lower Body Bathing: Modified independent;Sit to/from stand Lower Body Bathing Details (indicate cue type and reason): mod I for sponge bathing LB at the sink                     Functional mobility during ADLs: Modified independent General ADL Comments: at beginning of session pt was given 3 tasks to complete at the sink: brush teeth, wash face, and wash LB. She was able to complete all at a mod I level with only 1 verbal cue to "sit if needed" due to chronic lower back pain. No AD.  pt able to multi-task with carrying conversation during task.    Extremity/Trunk Assessment Upper Extremity Assessment Upper Extremity Assessment: RUE deficits/detail RUE Deficits / Details: Unable to AROM past 80 degrees shoulder flexion or abduction. Overall [facility] for funcitonal ADLs. Pt reports "tingling" sensation in RUE  and RLE. LUE Deficits / Details: Unable to AROM past 80 degrees shoulder flexion or abduction, MMT in all areas4-/5. Overall [facility] for functional ADLs this session   Lower Extremity Assessment Lower Extremity Assessment: Defer to PT evaluation        Vision   Vision Assessment?: No apparent visual deficits   Perception Perception Perception: Not tested   Praxis Praxis Praxis: Not tested    Cognition Arousal/Alertness: Awake/alert Behavior During Therapy: Flat affect Overall Cognitive Status: Impaired/Different from baseline Area of Impairment: Awareness;Problem solving;Memory                     Memory: Decreased short-term memory Following Commands: Follows multi-step commands with increased time Safety/Judgement: Decreased awareness of safety;Decreased awareness of deficits Awareness: Emergent Problem Solving: Slow processing General Comments: Pt able to sequence ADL and grooming tasks at mod I level for incrased time. She demonstrated good safety awareness, but needed 1 cue for problem solving.                General Comments VSS on RA    Pertinent Vitals/ Pain       Pain Assessment: Faces Faces Pain Scale: Hurts a little bit Pain Location: back (chronic) Pain Descriptors / Indicators: Aching Pain Intervention(s): Monitored during session   Frequency  Min 2X/week        Progress Toward Goals  OT Goals(current goals can now be found in the care plan section)  Progress towards OT goals: Progressing toward goals  Acute Rehab OT Goals Patient Stated Goal: figure out dc OT Goal Formulation: With patient Time For Goal Achievement: 10/01/21 Potential to Achieve Goals: Fair ADL Goals Pt Will Perform Grooming: with modified independence;standing Pt Will Perform Lower Body Bathing: with supervision;sitting/lateral leans;sit to/from stand Pt Will Perform Lower Body Dressing: with supervision;sitting/lateral leans;sit to/from stand Pt Will Transfer to  Toilet: with min guard assist;ambulating Pt Will Perform Toileting - Clothing Manipulation and hygiene: with min guard assist;sitting/lateral leans;sit to/from stand Additional ADL Goal #1: Pt will follow 75% of simple commands with no cuing during each session. Additional ADL Goal #2: Pt will complete 2-3 step higher cognitive level task.  Plan Discharge plan needs to be updated       AM-PAC OT "6 Clicks" Daily Activity     Outcome Measure   Help from another person eating meals?: None Help from another person taking care of personal grooming?: None Help from another person toileting, which includes using toliet, bedpan, or urinal?: A Little Help from another person bathing (including washing, rinsing, drying)?: None Help from another person to put on and taking off regular upper body clothing?: None Help from another person to put on and taking off regular lower body clothing?: A Little 6 Click Score: 22    End of Session Equipment Utilized During Treatment: Gait belt  OT Visit Diagnosis: Unsteadiness on feet (R26.81);Other abnormalities of gait and mobility (R26.89);Muscle weakness (generalized) (M62.81);Other symptoms and signs involving cognitive function   Activity Tolerance Patient tolerated treatment well   Patient Left in chair;with call bell/phone within reach;with chair alarm set   Nurse Communication Mobility status        Time: 9381-8299 OT Time Calculation (min): 20  min  Charges: OT General Charges $OT Visit: 1 Visit OT Treatments $Self Care/Home Management : 8-22 mins  Krimson Massmann A Keisa Blow 09/22/2021, 10:44 AM

## 2021-09-22 NOTE — Progress Notes (Signed)
[employer] Appeal Detailed Notice of Discharge letter created and saved: Yes Detailed Notice of Discharge Document Given to Pateint: Yes     Case number: [id number]  CMA to send in required information to [facility]

## 2021-09-22 NOTE — Progress Notes (Signed)
Patient ID: [name], female   DOB: [birth year], 60 y.o.   MRN: [id number] Patient was supposed to be discharged home with home health PT on 09/21/2021 but family has decided to appeal the discharge.  Patient seen and examined at bedside.  She is currently medically stable for discharge.  Please refer to the full discharge summary done by me on 09/21/2021 for full details.

## 2021-09-22 NOTE — Progress Notes (Signed)
Physical Therapy Treatment Patient Details Name: [name] MRN: [id number] DOB: 1961/03/30 Today's Date: 09/22/2021   History of Present Illness Pt is a 60 y.o. female who presented 09/15/21 with AMS and difficulty speaking. Outisde window for [employer]. Imaging revealed subacute 14 mm left thalamic lacunar infarct with petechial hemorrhage, but no malignant hemorrhagic transformation or significant mass effect. 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 PMH: DM2, HTN, HLD, hypothyroidism, Rheumatoid arthritis, diastolic congestive heart failure (Echo 06/2018 EF 65-70% with G2DD), bipolar disorder, obesity    PT Comments    Pt mod I for all mobility today, including bed mobility with HOB flat, repeated transfers, and gait. PT instructed pt in rollator use for energy conservation and pt safety at home, especially if pt is having increased back pain. Pt expresses she is most nervous about being home by herself at night if something were to happen or if she needs to go to the restroom, PT recommended considering emergency response button as well as BSC next to bed to prevent falls going to/from bathroom at night. Pt expresses understanding.     Recommendations for follow up therapy are one component of a multi-disciplinary discharge planning process, led by the attending physician.  Recommendations may be updated based on patient status, additional functional criteria and insurance authorization.  Follow Up Recommendations  Home health PT     Assistance Recommended at Discharge Intermittent Supervision/Assistance  Equipment Recommendations  Rollator (4 wheels);BSC/3in1    Recommendations for Other Services       Precautions / Restrictions Precautions Precautions: Fall (moderate fall risk) Restrictions Weight Bearing Restrictions: No     Mobility  Bed Mobility Overal bed mobility: Modified Independent                  Transfers Overall transfer level:  Modified independent   Transfers: Sit to/from Stand Sit to Stand: Modified independent (Device/Increase time)           General transfer comment: for increased time, no physical assist    Ambulation/Gait Ambulation/Gait assistance: Modified independent (Device/Increase time) Gait Distance (Feet): 350 Feet Assistive device: None;Rollator (4 wheels) Gait Pattern/deviations: Step-through pattern;Decreased stride length Gait velocity: decr     General Gait Details: cues for increasing gait speed as tolerated, min cues for functionality of rollator   Stairs             Wheelchair Mobility    Modified Rankin (Stroke Patients Only) Modified Rankin (Stroke Patients Only) Pre-Morbid Rankin Score: No symptoms Modified Rankin: Moderate disability     Balance Overall balance assessment: Needs assistance Sitting-balance support: Feet supported Sitting balance-Leahy Scale: Good     Standing balance support: No upper extremity supported;During functional activity Standing balance-Leahy Scale: Fair                              Cognition Arousal/Alertness: Awake/alert Behavior During Therapy: Flat affect Overall Cognitive Status: Impaired/Different from baseline Area of Impairment: Awareness;Problem solving;Memory                     Memory: Decreased short-term memory Following Commands: Follows multi-step commands with increased time   Awareness: Emergent Problem Solving: Slow processing          Exercises      General Comments        Pertinent Vitals/Pain Pain Assessment: Faces Faces Pain Scale: Hurts a little bit Pain Location:  back (chronic) Pain Descriptors / Indicators: Aching Pain Intervention(s): Limited activity within patient's tolerance;Monitored during session;Repositioned    Home Living                          Prior Function            PT Goals (current goals can now be found in the care plan section)  Acute Rehab PT Goals PT Goal Formulation: With patient Time For Goal Achievement: 10/01/21 Potential to Achieve Goals: Good Progress towards PT goals: Progressing toward goals    Frequency    Min 4X/week      PT Plan Discharge plan needs to be updated    Co-evaluation              AM-PAC PT "6 Clicks" Mobility   Outcome Measure  Help needed turning from your back to your side while in a flat bed without using bedrails?: None Help needed moving from lying on your back to sitting on the side of a flat bed without using bedrails?: None Help needed moving to and from a bed to a chair (including a wheelchair)?: None Help needed standing up from a chair using your arms (e.g., wheelchair or bedside chair)?: None Help needed to walk in hospital room?: A Little Help needed climbing 3-5 steps with a railing? : A Little 6 Click Score: 22    End of Session Equipment Utilized During Treatment: Gait belt Activity Tolerance: Patient tolerated treatment well Patient left: in chair;with call bell/phone within reach;with chair alarm set Nurse Communication: Mobility status PT Visit Diagnosis: Other abnormalities of gait and mobility (R26.89)     Time: 1121-1140 PT Time Calculation (min) (ACUTE ONLY): 19 min  Charges:  $Gait Training: 8-22 mins                     Marye Round, PT DPT Acute Rehabilitation Services Pager [phone]  Office [phone]   Maragret Vanacker E Stroup 09/22/2021, 1:17 PM

## 2021-09-22 NOTE — Significant Event (Addendum)
Pt with ongoing symptoms of constipation, no BM in 3 days.  Now failing Miralax, senna-docusate, and dulcolax supp.  Will order KUB portable to further investigate  Update: KUB unremarkable.  Will order  fleet enema x1.

## 2021-09-22 NOTE — TOC Progression Note (Signed)
Transition of Care [facility]) - Progression Note    Patient Details  Name: [name] MRN: [id number] Date of Birth: 1961/06/11  [facility] CM/SW Contact  Dorena Bodo Phone Number: 09/22/2021, 11:50 AM    [employer] Appeal Detailed Notice of Discharge letter created and saved: Yes Detailed Notice of Discharge Document Given to Pateint: Yes [employer] ROI Document Created: Yes [employer] appeal documents uploaded to [employer] stite: Yes

## 2021-09-22 NOTE — Care Management Important Message (Signed)
Important Message  Patient Details  Name: [name] MRN: [id number] Date of Birth: 09-Dec-1960   Medicare Important Message Given:  Yes     Renie Ora 09/22/2021, 10:54 AM

## 2021-09-23 ENCOUNTER — Inpatient Hospital Stay (HOSPITAL_COMMUNITY): Payer: Medicare Other

## 2021-09-23 DIAGNOSIS — I6381 Other cerebral infarction due to occlusion or stenosis of small artery: Secondary | ICD-10-CM | POA: Diagnosis not present

## 2021-09-23 DIAGNOSIS — R4701 Aphasia: Secondary | ICD-10-CM | POA: Diagnosis not present

## 2021-09-23 LAB — GLUCOSE, CAPILLARY
Glucose-Capillary: 223 mg/dL — ABNORMAL HIGH (ref 70–99)
Glucose-Capillary: 175 mg/dL — ABNORMAL HIGH (ref 70–99)

## 2021-09-23 MED ORDER — FLEET ENEMA 7-19 GM/118ML RE ENEM
1.0000 | ENEMA | Freq: Once | RECTAL | Status: AC | PRN
  Administered 2021-09-23: 01:00:00 1 via RECTAL
  Filled 2021-09-23: qty 1

## 2021-09-23 NOTE — Progress Notes (Signed)
Speech Language Pathology Treatment: Cognitive-Linquistic  Patient Details Name: [name] MRN: [id number] DOB: 21-Nov-1960 Today's Date: 09/23/2021 Time: 5284-1324 SLP Time Calculation (min) (ACUTE ONLY): 24 min  Assessment / Plan / Recommendation Clinical Impression  Pt seen for a cognitive/linguistic tx with min verbal/visual cues provided for aphasic symptoms with pt stating "I want to get my words out and to not have to stop and think about it."  Divergent naming tasks with min semantic/phonemic cues and sentence completion provided with overall decreased processing of information and recall noted with an overall accuracy of 60% achieved.  Pt completed confrontational tasks with 90% accuracy for environmental items.  Convergent naming tasks with before mentioned cueing with 70% accuracy achieved overall.  Pt with decreased insight to deficits and continued attention/memory deficits, but improvement observed from initial evaluation. Pt Ox4 this session with decreased orientation noted during evaluation.  Educated pt re: compensatory strategies for naming including describing, using gestures/writing and/or circumlocution to recall specific words during functional tasks/conversational turns.  Pt in agreement.  Spoke with pt's daughter, [name] re: d/c planning as she was concerned about pt being at home by herself and prefers d/c to SNF if appeal is overturned being her first preference.  ST recommended at next venue of care with SNF being safest option if insurance issues can be resolved d/t safety concerns/decreased cognitive abilities taken into consideration.  Thank you for allowing [place] to care for this pt in this setting.  ST will s/o in acute setting at this time.   HPI HPI: 60 year old female with past medical history of coronary artery disease  (S/P CABG 07/2018 at [facility]), hyperlipidemia, hypertension, insulin-dependent diabetes mellitus type 2, hypothyroidism, asthma, rheumatoid arthritis,  diastolic congestive heart failure (Echo 06/2018 EF 65-70% with G2DD), bipolar disorder, obesity who presented to med [facility] emergency department with family due to concerns over new onset confusion.   09/16/21 MRI head indicated Subacute 14 mm left thalamic lacunar infarct with petechial  hemorrhage, but no malignant hemorrhagic transformation or  significant mass effect; SLE completed; tx continued for speech/language deficits.      SLP Plan  Discharge SLP treatment due to tx recommended for cognitive/linguistic impairment at next venue of care      Recommendations for follow up therapy are one component of a multi-disciplinary discharge planning process, led by the attending physician.  Recommendations may be updated based on patient status, additional functional criteria and insurance authorization.    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

## 2021-09-23 NOTE — Progress Notes (Signed)
Patient discharged home on 12/26 by Dr. Pauletta Browns, appealed discharge.  Seen and examined at bedside, remains medically stable.  Please refer to the full DC summary done 12/26  [name] M. [name], MD, PhD [facility]  Between 7 am - 7 pm you can contact me via Amion (for emergencies) or Securechat (non urgent matters).  I am not available 7 pm - 7 am, please contact night coverage MD/APP via Amion

## 2021-09-23 NOTE — TOC Transition Note (Signed)
Transition of Care [facility]) - CM/SW Discharge Note   Patient Details  Name: [name] MRN: [id number] Date of Birth: 1961/05/14  Transition of Care [facility]) CM/SW Contact:  Kermit Balo, RN Phone Number: 09/23/2021, 3:44 PM   Clinical Narrative:    CM received word from CM office and daughter that [employer] has upheld the discharge.  Daughter in agreement with taking the patient home. CM provided choice under medicare.gov for home health and [facility] selected. Information on the AVS. Rollator and 3in1 ordered through [employer] and will be delivered to the room.  Daughter states she has arrange some care services for home. She will provide transport home.    Final next level of care: Home w Home Health Services Barriers to Discharge: No Barriers Identified   Patient Goals and CMS Choice   CMS Medicare.gov Compare Post Acute Care list provided to:: Patient Represenative (must comment) Choice offered to / list presented to : Adult Children  Discharge Placement                       Discharge Plan and Services In-house Referral: Clinical Social Work Discharge Planning Services: CM Consult Post Acute Care Choice: Skilled Nursing Facility          DME Arranged: 3-N-1, Walker rolling with seat DME Agency: [employer] Date DME Agency Contacted: 09/23/21   Representative spoke with at DME Agency: Velna Hatchet HH Arranged: PT, OT [facility] Agency: [facility] Date [facility] Agency Contacted: 09/23/21   Representative spoke with at [facility] Agency: Kandee Keen  Social Determinants of Health (SDOH) Interventions     Readmission Risk Interventions No flowsheet data found.

## 2021-09-23 NOTE — Progress Notes (Signed)
Discharge instructions discussed with patient and daughter at bedside. All questions answered. Iv removed telemetry discontinued. Patient belongings and equipment sent with daughter. Pt wheeled off unit for transport home.   Melony Overly, RN

## 2021-09-23 NOTE — TOC Progression Note (Signed)
Transition of Care [facility]) - Progression Note    Patient Details  Name: [name] MRN: [id number] Date of Birth: Jan 13, 1961  Transition of Care [facility]) CM/SW Contact  Kermit Balo, RN Phone Number: 09/23/2021, 11:25 AM  Clinical Narrative:    CM spoke to daughter, [name] over the phone. She still wants to see what the outcome of [employer] appeal will provide. Cm has updated her that [employer] only determines if her mother is stable for discharge and agree with the MD. [employer] will not assist in getting her mother into a rehab.  CM sent info to [employer] yesterday for auth for SNF and pt has been denied. Daughter updated. She wants to continue the appeal.  TOC following.   Expected Discharge Plan: Skilled Nursing Facility Barriers to Discharge: Continued Medical Work up  Expected Discharge Plan and Services Expected Discharge Plan: Skilled Nursing Facility In-house Referral: Clinical Social Work Discharge Planning Services: CM Consult Post Acute Care Choice: Skilled Nursing Facility Living arrangements for the past 2 months: Single Family Home Expected Discharge Date: 09/21/21                                     Social Determinants of Health (SDOH) Interventions    Readmission Risk Interventions No flowsheet data found.

## 2021-09-23 NOTE — Progress Notes (Signed)
Pt c/o of constipation, no BM since 09/17/21, had Miralax and Senna, together with Doculax Suppository without effect, Dr Julian Reil notified, KUB ordered, will continue to monitor. [name], [name]

## 2021-09-23 NOTE — Progress Notes (Signed)
Physical Therapy Treatment Patient Details Name: [name] MRN: [id number] DOB: [birth year] Today's Date: 09/23/2021   History of Present Illness Pt is a 60 y.o. female who presented 09/15/21 with AMS and difficulty speaking. Outisde window for [employer]. Imaging revealed subacute 14 mm left thalamic lacunar infarct with petechial hemorrhage, but no malignant hemorrhagic transformation or significant mass effect. 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 PMH: DM2, HTN, HLD, hypothyroidism, Rheumatoid arthritis, diastolic congestive heart failure (Echo 06/2018 EF 65-70% with G2DD), bipolar disorder, obesity    PT Comments    Pt making good progress.  She is mobilizing at supervision to mod I level.  Did not require any assist for transfers and demonstrated good safety with rollator without cues.  Does reports L leg feels a little weaker and she moves slower and fatigues easier than baseline, but is improving.  Pt performing cognitive task with mobility.  Educated on home safety and agree with intermittent supervision. Continue to progress as able.     Recommendations for follow up therapy are one component of a multi-disciplinary discharge planning process, led by the attending physician.  Recommendations may be updated based on patient status, additional functional criteria and insurance authorization.  Follow Up Recommendations  Home health PT     Assistance Recommended at Discharge Intermittent Supervision/Assistance  Equipment Recommendations  Rollator (4 wheels);BSC/3in1    Recommendations for Other Services       Precautions / Restrictions Precautions Precautions: None     Mobility  Bed Mobility Overal bed mobility: Needs Assistance Bed Mobility: Supine to Sit     Supine to sit: Supervision     General bed mobility comments: Had supervision but performed with bed flat and without rails    Transfers Overall transfer level: Needs  assistance Equipment used: None Transfers: Sit to/from Stand Sit to Stand: Supervision           General transfer comment: Supervision during PT but demonstrated safely from bed and rollator; pt utilizing locks on rollator and parking on wall to sit    Ambulation/Gait Ambulation/Gait assistance: Supervision Gait Distance (Feet): 350 Feet Assistive device: Rollator (4 wheels) Gait Pattern/deviations: Step-through pattern Gait velocity: decr     General Gait Details: Pt with good foot clearance and steady gait with light use of rollator (more for energy conservation and rest breaks).  Pt has ambulated in room without AD.  Does have slow gait speed - encouraged to increase as able.   Stairs             Wheelchair Mobility    Modified Rankin (Stroke Patients Only) Modified Rankin (Stroke Patients Only) Pre-Morbid Rankin Score: No symptoms Modified Rankin: Moderate disability     Balance Overall balance assessment: Needs assistance Sitting-balance support: Feet supported Sitting balance-Leahy Scale: Good     Standing balance support: No upper extremity supported;During functional activity Standing balance-Leahy Scale: Good Standing balance comment: rollator to ambulate but could perform task in standing without AD                            Cognition Arousal/Alertness: Awake/alert Behavior During Therapy: Flat affect Overall Cognitive Status: Within Functional Limits for tasks assessed                                 General Comments: Pt alert and oriented x 4; able  to name 3 animals beginning with "C", months of years backward, and subtraction while walking; recalled how to "park" rollator for rest break safely without cues        Exercises      General Comments General comments (skin integrity, edema, etc.): VSS on RA.  Family had requested appeal b/c did not feel safe with pt at home alone - appeal has been denied per daughter.   Discussed that pt is safely performing all transfers without assist and recalled sequencing on locking rollator and "parking" without cues.  Encouraged pt that she is moving well, without assist, and completed cognitive task while doing so.  Did agree that having intermittent supervision for the first couple weeks is a good idea to assist with food, bathing, cleaning.  Educated on home safety including decluttering for rollator access, night lights, limiting throw rugs, using rollator for rest breaks and to carry items such as drinks/food, having cell phone or alarm button available, and BSC at bedside for nighttime.  Daughter reports pt has most of this or is getting it within the next couple days.      Pertinent Vitals/Pain Pain Assessment: No/denies pain    Home Living                          Prior Function            PT Goals (current goals can now be found in the care plan section) Progress towards PT goals: Progressing toward goals    Frequency    Min 4X/week      PT Plan Current plan remains appropriate    Co-evaluation              AM-PAC PT "6 Clicks" Mobility   Outcome Measure  Help needed turning from your back to your side while in a flat bed without using bedrails?: None Help needed moving from lying on your back to sitting on the side of a flat bed without using bedrails?: None Help needed moving to and from a bed to a chair (including a wheelchair)?: None Help needed standing up from a chair using your arms (e.g., wheelchair or bedside chair)?: None Help needed to walk in hospital room?: A Little Help needed climbing 3-5 steps with a railing? : A Little 6 Click Score: 22    End of Session Equipment Utilized During Treatment: Gait belt Activity Tolerance: Patient tolerated treatment well Patient left: with call bell/phone within reach;in bed;with family/visitor present Nurse Communication: Mobility status (notified unit secretary that pt's  daughter present and asking about gettting dressed for d/c) PT Visit Diagnosis: Other abnormalities of gait and mobility (R26.89)     Time: 7062-3762 PT Time Calculation (min) (ACUTE ONLY): 22 min  Charges:  $Gait Training: 8-22 mins                     Anise Salvo, PT Acute Rehab Services Pager [phone] [facility] [phone]    Rayetta Humphrey 09/23/2021, 3:36 PM

## 2021-09-25 ENCOUNTER — Telehealth: Payer: Self-pay

## 2021-09-25 NOTE — Telephone Encounter (Signed)
BS want someone to reach out and tell pt its not urgent and she can be seen in Feb  And explain this to her.        Heide Scales  Patient Appointment Schedule Request Pool 21 hours ago (6:54 PM)   Do you have anything sooner? She has a stroke last week and has been diagnosed with congestive heart failure, she needs a follow up within a one-two weeks if possible.    Thank you, [name]  (daughter)    Adalind, Weitz 23 hours ago (4:04 PM)   MH Hello, you're scheduled on 2/15 at 2pm first available     Heide Scales  Patient Appointment Schedule Request Pool Yesterday (2:14 PM)   Appointment Request From: Heide Scales   With Provider: Ellsworth Lennox, PA-C [facility] [place]]   Preferred Date Range: 10/01/2021 - 10/07/2021   Preferred Times: Any Time   Reason for visit: Office Visit   Comments: Follow up from recent hospitalization.

## 2021-09-25 NOTE — Telephone Encounter (Signed)
I spoke with daughter, [name]. I talked to her regarding her mothers echocardiogram done 09/16/21. Per B.Patrick Jupiter, Ms.Batterman has normal ejection fraction and does not have heart failure at this time. [place] felt the follow up for Ms.Forster scheduled for 11/11/21 is appropriate. Daughter feels comfortable with this.

## 2021-09-29 ENCOUNTER — Other Ambulatory Visit: Payer: Self-pay

## 2021-09-29 NOTE — Patient Outreach (Signed)
Received a red flag Emmi stroke notification for [name] a [employer] patient. I have assigned Antionette Fairy, RN call for follow up.

## 2021-09-29 NOTE — Patient Outreach (Signed)
[facility] Care Management  09/29/2021  [name] [birth year] [id number]    EMMI-STROKE-Non APL RED ON EMMI ALERT Day # 1 Date: 09/25/2021 Red Alert Reason: "Scheduled follow up appt? No Problems setting up rehab? Yes"   Outreach attempt # 1 to patient. Spoke briefly with patient who reports she is doing fairly well and denies any issues or concerns at present. Reviewed and addressed red alert. Patient reports daughter scheduled appt and she believes it is for 10/15/21. She confirms she has transportation to get to appts. Patient voices she has all her meds and no issues regarding them. Rehab was not ordered. Patient to get HHPT and she states that therapist coming today. Advised patient that they would continue to get automated EMMI-Stroke post discharge calls to assess how they are doing following recent hospitalization and will receive a call from a nurse if any of their responses were abnormal. Patient voiced understanding and was appreciative of f/u call.   Plan: RN CM will close referral at this time.   Enzo Montgomery, RN,BSN,CCM [facility] Telephonic Care Management Coordinator Direct Phone: [phone] Toll Free: [phone] Fax: [phone]

## 2021-10-13 ENCOUNTER — Telehealth: Payer: Self-pay | Admitting: Nurse Practitioner

## 2021-10-13 NOTE — Telephone Encounter (Signed)
Patient needs an appointment for refills. This was sent back to them on the refill request.

## 2021-10-13 NOTE — Telephone Encounter (Signed)
[name] with [facility] pharmacy about a rx request they sent on 10/05/20 about needing a refill for pen needles. States they have not heard back. Call back number is [phone]

## 2021-11-11 ENCOUNTER — Encounter: Payer: Self-pay | Admitting: Student

## 2021-11-11 ENCOUNTER — Other Ambulatory Visit: Payer: Self-pay

## 2021-11-11 ENCOUNTER — Ambulatory Visit: Payer: Medicare HMO | Admitting: Student

## 2021-11-11 VITALS — BP 106/68 | HR 58 | Ht 65.0 in | Wt 270.0 lb

## 2021-11-11 DIAGNOSIS — I2510 Atherosclerotic heart disease of native coronary artery without angina pectoris: Secondary | ICD-10-CM

## 2021-11-11 DIAGNOSIS — R600 Localized edema: Secondary | ICD-10-CM

## 2021-11-11 DIAGNOSIS — I10 Essential (primary) hypertension: Secondary | ICD-10-CM | POA: Diagnosis not present

## 2021-11-11 DIAGNOSIS — Z8673 Personal history of transient ischemic attack (TIA), and cerebral infarction without residual deficits: Secondary | ICD-10-CM

## 2021-11-11 DIAGNOSIS — E785 Hyperlipidemia, unspecified: Secondary | ICD-10-CM

## 2021-11-11 MED ORDER — FUROSEMIDE 20 MG PO TABS: 20.0000 mg | ORAL_TABLET | Freq: Every day | ORAL | Status: AC

## 2021-11-11 NOTE — Patient Instructions (Signed)
Medication Instructions:   Continue current medication regimen.    Lab Work:  If you PCP does not check labs next month, please contact our office so we can arrange for a Basic Metabolic Panel.    Follow-Up: At [facility], you and your health needs are our priority.  As part of our continuing mission to provide you with exceptional heart care, we have created designated Provider Care Teams.  These Care Teams include your primary Cardiologist (physician) and Advanced Practice Providers (APPs -  Physician Assistants and Nurse Practitioners) who all work together to provide you with the care you need, when you need it.  We recommend signing up for the patient portal called "MyChart".  Sign up information is provided on this After Visit Summary.  MyChart is used to connect with patients for Virtual Visits (Telemedicine).  Patients are able to view lab/test results, encounter notes, upcoming appointments, etc.  Non-urgent messages can be sent to your provider as well.   To learn more about what you can do with MyChart, go to [email or web address].    Your next appointment:   6 month(s)  The format for your next appointment:   In Person  Provider:   Randall An, PA-C or MD  If primary card or EP is not listed click here to update    :1}    Other Instructions  Report back if systolic reading (top blood pressure reading) is staying in the low-100's or 90's.

## 2021-11-11 NOTE — Progress Notes (Signed)
Cardiology Office Note    Date:  11/11/2021   ID:  KAO BERKHEIMER, DOB 1961-04-08, MRN [id number]  PCP:  Christain Sacramento, MD  Cardiologist: Previously Dr. Bronson Ing --> Needs to switch to new MD  Chief Complaint  Patient presents with   Hospitalization Follow-up    History of Present Illness:    LARIZA COTHRON is a 61 y.o. female  with past medical history of CAD (s/p CABG in 07/2018 with SVG-PDA, SVG-RI, seq LIMA-mid-LAD-distal LAD), HTN, HLD and IDDM who presents to the office today for hospital follow-up.  She was last examined by myself in 11/2020 and reported overall doing well at that time and denied any recent anginal symptoms. Plavix was discontinued given the timeframe since CABG and she was switched to ASA 81 mg daily along with being continued on Toprol-XL and Crestor.  In the interim, she was admitted to [facility] in 08/2021 for an acute CVA with imaging showing a 14 mm left thalamic lacunar infarct and petechial hemorrhage. Neurology did follow the patient during admission and recommended DAPT with ASA and Plavix for 3 weeks followed by Plavix alone. Echocardiogram during admission showed a preserved EF of 60 to 65% with no regional wall motion abnormalities. She did have grade 2 diastolic dysfunction and no significant valve abnormalities. Neurology did not recommend an event monitor by review of notes.  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 She has noticed improvement in her symptoms since hospital discharge and her cognitive impairment is starting to improve as well. They are exploring Assisted Living options in this area. She denies any recent chest pain or palpitations. Her breathing has been stable with no reported orthopnea or PND. She was experiencing lower extremity edema last month and was restarted on Lasix 20 mg daily by her  PCP and has remained on this.  Past Medical History:  Diagnosis Date   Asthma    Back pain    CAD (coronary artery disease)    a. 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 100 strip 0   albuterol (PROVENTIL HFA;VENTOLIN HFA) 108 (90 Base) MCG/ACT inhaler Inhale 2 puffs into the lungs every 6 (six) hours as needed for wheezing or shortness of breath. 1 Inhaler 0   amLODipine (NORVASC) 10 MG tablet Take 1 tablet (10 mg total) by mouth daily. For high blood pressure 30 tablet 0   BD PEN NEEDLE NANO U/F 32G X 4 MM MISC USE AS DIRECTED TWICE DAILY 100 each 0   blood glucose meter kit and supplies KIT 1 each by Does not apply route 4 (four) times daily. Dispense based on patient and insurance preference. Use up  to four times daily as directed. (FOR ICD-9 250.00, 250.01). 1 each 0   busPIRone (BUSPAR) 30 MG tablet Take 1 tablet (30 mg total) by mouth 3 (three) times daily. 270 tablet 2   Cholecalciferol (VITAMIN D3) 125 MCG (5000 UT) CAPS Take 1 capsule (5,000 Units total) by mouth daily. 90 capsule 0   clopidogrel (PLAVIX) 75 MG tablet Take 1 tablet (75 mg total) by mouth daily. 30 tablet 0   DULoxetine (CYMBALTA) 60 MG capsule Take 1 capsule (60 mg total) by mouth 2 (two) times daily. 60 capsule 3   ezetimibe (ZETIA) 10 MG tablet Take 1 tablet (10 mg total) by mouth daily. 30 tablet 0   glipiZIDE  (GLUCOTROL) 10 MG tablet Take 1 tablet (10 mg total) by mouth 2 (two) times daily before a meal. 60 tablet 0   hydrOXYzine (VISTARIL) 25 MG capsule Take 1 capsule (25 mg total) by mouth 3 (three) times daily as needed. 90 capsule 2   insulin isophane & regular human KwikPen (NOVOLIN 70/30 KWIKPEN) (70-30) 100 UNIT/ML KwikPen Inject 25 Units into the skin 2 (two) times daily before lunch and supper.     levothyroxine (SYNTHROID, LEVOTHROID) 50 MCG tablet Take 1 tablet (50 mcg total) by mouth daily before breakfast. For hypothyroidism 30 tablet 0   metoprolol succinate (TOPROL-XL) 25 MG 24 hr tablet TAKE 1 TABLET EVERY DAY (NEED CARDIOLOGY APPOINTMENT FOR REFILLS) (Patient taking differently: Take 25 mg by mouth daily.) 30 tablet 6   nitroGLYCERIN (NITROSTAT) 0.4 MG SL tablet Place 1 tablet (0.4 mg total) under the tongue every 5 (five) minutes as needed for chest pain. 25 tablet 3   risperiDONE (RISPERDAL) 2 MG tablet Take 1 tablet (2 mg total) by mouth 2 (two) times daily. 180 tablet 2   rizatriptan (MAXALT) 5 MG tablet Take 5 mg by mouth daily as needed for migraine.     rosuvastatin (CRESTOR) 40 MG tablet Take 1 tablet (40 mg total) by mouth daily. 90 tablet 3   traZODone (DESYREL) 100 MG tablet Take 0.5 tablets (50 mg total) by mouth at bedtime.     Vitamin D, Ergocalciferol, (DRISDOL) 1.25 MG (50000 UNIT) CAPS capsule Take 1 capsule (50,000 Units total) by mouth every 7 (seven) days. 12 capsule 0   aspirin EC 81 MG tablet Take 1 tablet (81 mg total) by mouth daily. Swallow whole. 30 tablet 11   No facility-administered medications prior to visit.     Allergies:   Amoxicillin, Doxycycline, Levofloxacin, and Propoxyphene n-acetaminophen   Social History   Socioeconomic History   Marital status: Widowed    Spouse name: Not on file   Number of children: Not on file   Years of education: Not on file   Highest education level: Not on file  Occupational History   Occupation: Disabled     Employer: UNEMPLOYED  Tobacco Use   Smoking status: Never   Smokeless tobacco: Never  Vaping Use   Vaping Use: Never used  Substance and Sexual Activity   Alcohol use: No    Alcohol/week: 0.0 standard drinks   Drug use: No    Comment: 05/13/2016 per pt no   Sexual activity: Yes    Birth control/protection: Surgical  Other Topics Concern   Not on file  Social History Narrative   Married   No regular exercise   Social Determinants of Health   Financial Resource Strain: Not on file  Food Insecurity: Not on file  Transportation Needs: Not on file  Physical Activity: Not on file  Stress: Not on file  Social Connections: Not on file     Family History:  The patient's family history includes Alcohol abuse in her maternal grandfather and maternal uncle; Depression in her maternal grandmother, maternal uncle, and mother; Heart attack in her maternal uncle.   Review of Systems:    Please see the history of present illness.     All other systems reviewed and are otherwise negative except as noted above.   Physical Exam:    VS:  BP 106/68    Pulse (!) 58    Ht 5' 5" (1.651 m)    Wt 270 lb (122.5 kg)    SpO2 96%    BMI 44.93 kg/m    General: Well developed, well nourished,female appearing in no acute distress. Head: Normocephalic, atraumatic. Neck: No carotid bruits. JVD not elevated.  Lungs: Respirations regular and unlabored, without wheezes or rales.  Heart: Regular rate and rhythm. No S3 or S4.  No murmur, no rubs, or gallops appreciated. Abdomen: Appears non-distended. No obvious abdominal masses. Msk:  Strength and tone appear normal for age. No obvious joint deformities or effusions. Extremities: No clubbing or cyanosis. No pitting edema.  Distal pedal pulses are 2+ bilaterally. Neuro: Alert and oriented X 3. Right-sided weakness.  Psych:  Responds to questions appropriately with a normal affect. Skin: No rashes or lesions noted  Wt Readings from Last 3 Encounters:   11/11/21 270 lb (122.5 kg)  09/15/21 280 lb (127 kg)  04/03/21 267 lb (121.1 kg)     Studies/Labs Reviewed:   EKG:  EKG is not ordered today.  Recent Labs: 09/15/2021: B Natriuretic Peptide 82.9; TSH 1.089 09/17/2021: ALT 14 09/22/2021: BUN 24; Creatinine, Ser 1.44; Hemoglobin 12.0; Magnesium 2.0; Platelets 228; Potassium 3.8; Sodium 137   Lipid Panel    Component Value Date/Time   CHOL 299 (H) 09/16/2021 0624   CHOL 350 (H) 11/06/2020 1524   TRIG 183 (H) 09/16/2021 0624   HDL 36 (L) 09/16/2021 0624   HDL 44 11/06/2020 1524   CHOLHDL 8.3 09/16/2021 0624   VLDL 37 09/16/2021 0624   LDLCALC 226 (H) 09/16/2021 0624   LDLCALC 243 (H) 11/06/2020 1524   LDLCALC 163 (H) 01/30/2019 0934    Additional studies/ records that were reviewed today include:   Echocardiogram: 08/2021 IMPRESSIONS     1. Left ventricular ejection fraction, by estimation, is 60 to 65%. The  left ventricle has normal function. The left ventricle has no regional  wall motion abnormalities. Left ventricular diastolic parameters are  consistent with Grade II diastolic  dysfunction (pseudonormalization). Elevated left atrial pressure.   2. Right ventricular systolic function is normal. The right ventricular  size is normal. Tricuspid regurgitation signal is inadequate for assessing  PA pressure.   3. Left atrial size was mildly dilated.   4. The mitral valve is normal in structure. No evidence of mitral valve  regurgitation.   5. The aortic valve is tricuspid. There is mild calcification of the  aortic valve. There is mild thickening of the aortic valve. Aortic valve  regurgitation is not visualized. Aortic valve sclerosis is present, with  no evidence of aortic valve stenosis.   6. Agitated saline contrast bubble study was negative, with no evidence  of any interatrial shunt.   Assessment:    1. Coronary artery disease involving native coronary artery of native heart without angina pectoris   2.  Essential hypertension   3. Hyperlipidemia LDL goal <  4. Bilateral lower extremity edema   °5. History of CVA (cerebrovascular accident)   ° ° ° °Plan:  ° °In order of problems listed above: ° °1. CAD °- She is s/p CABG in 07/2018 with SVG-PDA, SVG-RI, seq LIMA-mid-LAD-distal LAD. Her activity has been limited since her CVA but she denies any recent anginal symptoms when working with PT or OT. °- Continue current medication regimen with Plavix 75 mg daily (on this per Neurology), Toprol-XL 25 mg daily, Crestor 40 mg daily and Zetia 10 mg daily. ° °2. HTN °- She has lost approximately 15 pounds since her CVA given dietary changes and blood pressure has been softer when recorded at home. At 106/68 during today's visit. I encouraged them to report back if this remains low as Amlodipine may need to be reduced from 10 mg daily to 5 mg daily. Will continue at current dosing for now along with Toprol-XL 25 mg daily. ° °3. HLD °- LDL had been elevated to 226 during her admission and she had been without Crestor. Was restarted on Crestor 40 mg daily with Zetia 10 mg daily being added to her medication regimen. She is scheduled to see her PCP in 2 to 3 weeks and prefers to have repeat labs at that time. Would recommend at least rechecking an FLP within the next 2 to 3 months for reassessment with goal LDL less than 70. ° °4. Lower Extremity Edema °- Recent echocardiogram showed a preserved EF of 60 to 65% with grade 2 diastolic dysfunction. She is on Lasix 20 mg daily as this was recently restarted by his PCP's office. We did discuss obtaining a repeat BMET today and she prefers to have this at her PCP's office in 2-3 weeks.  ° °5. Recent CVA °- She does have scheduled follow-up with Neurology in 11/2021. Remains on Plavix, Zetia and Crestor. ° ° °Medication Adjustments/Labs and Tests Ordered: °Current medicines are reviewed at length with the patient today.  Concerns regarding medicines are outlined above.  Medication  changes, Labs and Tests ordered today are listed in the Patient Instructions below. °Patient Instructions  °Medication Instructions:  ° °Continue current medication regimen.  ° ° °Lab Work: ° °If you PCP does not check labs next month, please contact our office so we can arrange for a Basic Metabolic Panel.  ° ° °Follow-Up: °At [facility], you and your health needs are our priority.  As part of our continuing mission to provide you with exceptional heart care, we have created designated Provider Care Teams.  These Care Teams include your primary Cardiologist (physician) and Advanced Practice Providers (APPs -  Physician Assistants and Nurse Practitioners) who all work together to provide you with the care you need, when you need it. ° °We recommend signing up for the patient portal called "MyChart".  Sign up information is provided on this After Visit Summary.  MyChart is used to connect with patients for Virtual Visits (Telemedicine).  Patients are able to view lab/test results, encounter notes, upcoming appointments, etc.  Non-urgent messages can be sent to your provider as well.   °To learn more about what you can do with MyChart, go to [email or web address].   ° °Your next appointment:   °6 month(s) ° °The format for your next appointment:   °In Person ° °Provider:   ° , PA-C or MD ° °If primary card or EP is not listed click here to update    :1}  ° ° °Other Instructions ° °Report back if   systolic reading (top blood pressure reading) is staying in the low-100's or 90's.  °  ° °Signed, ° M , PA-C  °11/11/2021 4:55 PM    °[facility] °[address] , [place] [address] °Phone: [phone] °Fax: [phone] ° ° °

## 2021-11-17 ENCOUNTER — Other Ambulatory Visit (HOSPITAL_COMMUNITY): Payer: Self-pay | Admitting: Psychiatry

## 2021-11-17 NOTE — Telephone Encounter (Signed)
Call for appt

## 2021-12-03 ENCOUNTER — Ambulatory Visit: Payer: Medicare HMO | Admitting: Neurology

## 2021-12-03 ENCOUNTER — Encounter: Payer: Self-pay | Admitting: Neurology

## 2021-12-03 VITALS — BP 151/81 | HR 73 | Ht 65.0 in | Wt 270.0 lb

## 2021-12-03 DIAGNOSIS — I10 Essential (primary) hypertension: Secondary | ICD-10-CM

## 2021-12-03 DIAGNOSIS — E782 Mixed hyperlipidemia: Secondary | ICD-10-CM

## 2021-12-03 DIAGNOSIS — E1169 Type 2 diabetes mellitus with other specified complication: Secondary | ICD-10-CM | POA: Diagnosis not present

## 2021-12-03 DIAGNOSIS — I6381 Other cerebral infarction due to occlusion or stenosis of small artery: Secondary | ICD-10-CM | POA: Diagnosis not present

## 2021-12-03 NOTE — Progress Notes (Signed)
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 Left thalamic lacunar infarct with petechial hemorrhage likely secondary to occlusion or stenosis.  Here today with daughter, Asencion Partridge. Patient is living at home alone, but [place] moving [place] next week. Has aide coming 20 hours a week, needs helps with ADLs, medications since the stroke. Since stroke, memory loss (forget her meds/if she ate), word finding. At discharge, couldn't get coverage for SNF. Had [facility] come out, PT was great, not much given for ST. Would like more speech therapy. Has seen cardiology, has lost 15 lbs, her aide is managing her diet. Underlying mood disorder, bipolar disorder, doing okay on Risperdal. Getting around well independently.   -MRI showed subacute 14 mm left thalamic lacunar infarct with petechial hemorrhage -CTA head showed no intracranial LVO; intracranial atherosclerotic disease moderate to severe stenosis within the mid M2 MCA vessels bilaterally -CTA neck-left ICA stenosis 60 to 70% -2D echo EF 60 to 65%, normal left ventricular function, no shunt -LDL 226 -A1c 8.9 -On aspirin 81 mg prior to admission, 3 weeks DAPT, then Plavix alone  HISTORY  09/16/2021 Dr. Leonie Man: [name] is a 61 y.o. female with history of DM2, HTN, HLD, hypothyroidism, Rheumatoid arthritis, diastolic congestive heart failure (Echo 06/2018 EF 65-70% with G2DD), bipolar disorder, obesity  presenting with confusion and difficulty speaking.  Last known well was 4 PM 09/14/2021.  She did not receive TNK, not a candidate for interventional radiology given low concern for LVO.  REVIEW OF SYSTEMS: Out of a complete 14 system review of symptoms,  the patient complains only of the following symptoms, and all other reviewed systems are negative.  See HPI  ALLERGIES: Allergies  Allergen Reactions   Amoxicillin Swelling and Other (See Comments)    Oral swelling    Doxycycline Nausea And Vomiting   Levofloxacin Other (See Comments)    headache   Propoxyphene N-Acetaminophen Nausea And Vomiting    HOME MEDICATIONS: Outpatient Medications Prior to Visit  Medication Sig Dispense Refill   ACCU-CHEK GUIDE test strip USE TO TEST BLOOD SUGAR 4 TIMES DAILY. 100 strip 0   albuterol (PROVENTIL HFA;VENTOLIN HFA) 108 (90 Base) MCG/ACT inhaler Inhale 2 puffs into the lungs every 6 (six) hours as needed for wheezing or shortness of breath. 1 Inhaler 0   amLODipine (NORVASC) 10 MG tablet Take 1 tablet (10 mg total) by mouth daily. For high blood pressure 30 tablet 0   BD PEN NEEDLE NANO U/F 32G X 4 MM MISC USE AS DIRECTED TWICE DAILY 100 each 0   blood glucose meter kit and supplies KIT 1 each by Does not apply route 4 (four) times daily. Dispense based on patient and insurance preference. Use up to four times daily as directed. (FOR ICD-9 250.00, 250.01). 1 each 0   busPIRone (BUSPAR) 30 MG tablet Take 1 tablet (30 mg total) by mouth 3 (three) times daily. 270 tablet 2   Cholecalciferol (VITAMIN D3) 125 MCG (5000 UT) CAPS Take 1 capsule (5,000 Units total) by mouth daily. 90 capsule 0   clopidogrel (PLAVIX) 75 MG tablet Take 1 tablet (75 mg total) by mouth daily. 30 tablet 0   DULoxetine (CYMBALTA) 60 MG capsule Take 1 capsule (60 mg total) by  mouth 2 (two) times daily. 60 capsule 3   ezetimibe (ZETIA) 10 MG tablet Take 1 tablet (10 mg total) by mouth daily. 30 tablet 0   furosemide (LASIX) 20 MG tablet Take 1 tablet (20 mg total) by mouth daily. 30 tablet    glipiZIDE (GLUCOTROL) 10 MG tablet Take 1 tablet (10 mg total) by mouth 2 (two) times daily before a meal. 60 tablet 0   hydrOXYzine (VISTARIL) 25 MG capsule TAKE 1 CAPSULE THREE TIMES DAILY  AS NEEDED 90 capsule 2   insulin isophane & regular human KwikPen (NOVOLIN 70/30 KWIKPEN) (70-30) 100 UNIT/ML KwikPen Inject 25 Units into the skin 2 (two) times daily before lunch and supper.     levothyroxine (SYNTHROID, LEVOTHROID) 50 MCG tablet Take 1 tablet (50 mcg total) by mouth daily before breakfast. For hypothyroidism 30 tablet 0   metoprolol succinate (TOPROL-XL) 25 MG 24 hr tablet TAKE 1 TABLET EVERY DAY (NEED CARDIOLOGY APPOINTMENT FOR REFILLS) (Patient taking differently: Take 25 mg by mouth daily.) 30 tablet 6   nitroGLYCERIN (NITROSTAT) 0.4 MG SL tablet Place 1 tablet (0.4 mg total) under the tongue every 5 (five) minutes as needed for chest pain. 25 tablet 3   risperiDONE (RISPERDAL) 2 MG tablet Take 1 tablet (2 mg total) by mouth 2 (two) times daily. 180 tablet 2   rizatriptan (MAXALT) 5 MG tablet Take 5 mg by mouth daily as needed for migraine.     rosuvastatin (CRESTOR) 40 MG tablet Take 1 tablet (40 mg total) by mouth daily. 90 tablet 3   traZODone (DESYREL) 100 MG tablet Take 0.5 tablets (50 mg total) by mouth at bedtime.     Vitamin D, Ergocalciferol, (DRISDOL) 1.25 MG (50000 UNIT) CAPS capsule Take 1 capsule (50,000 Units total) by mouth every 7 (seven) days. 12 capsule 0   No facility-administered medications prior to visit.    PAST MEDICAL HISTORY: Past Medical History:  Diagnosis Date   Asthma    Back pain    CAD (coronary artery disease)    a. 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   Thyroid disease     PAST SURGICAL HISTORY: Past Surgical History:  Procedure Laterality Date   TUBAL LIGATION     Bilateral    FAMILY  HISTORY: Family History  Problem Relation Age of Onset   Heart attack Maternal Uncle    Depression Maternal Uncle    Depression Mother    Alcohol abuse Maternal Grandfather    Alcohol abuse Maternal Uncle    Depression Maternal Grandmother     SOCIAL HISTORY: Social History   Socioeconomic History   Marital status: Widowed    Spouse name: Not on file   Number of children: Not on file   Years of education: Not on file   Highest education level: Not on file  Occupational History   Occupation: Disabled    Employer: UNEMPLOYED  Tobacco Use   Smoking status: Never   Smokeless tobacco: Never  Vaping Use   Vaping Use: Never used  Substance and Sexual Activity   Alcohol use: No    Alcohol/week: 0.0 standard  drinks   Drug use: No    Comment: 05/13/2016 per pt no   Sexual activity: Yes    Birth control/protection: Surgical  Other Topics Concern   Not on file  Social History Narrative   Married   No regular exercise   Social Determinants of Health   Financial Resource Strain: Not on file  Food Insecurity: Not on file  Transportation Needs: Not on file  Physical Activity: Not on file  Stress: Not on file  Social Connections: Not on file  Intimate Partner Violence: Not on file   PHYSICAL EXAM  Vitals:   12/03/21 1003  BP: (!) 151/81  Pulse: 73  Weight: 270 lb (122.5 kg)  Height: _0  (1.651 m)   Body mass index is 44.93 kg/m.  Generalized: Well developed, in no acute distress, disheveled, flat affect MMSE - Mini Mental State Exam 12/03/2021  Orientation to time 5  Orientation to Place 5  Registration 3  Attention/ Calculation 5  Recall 1  Language- name 2 objects 2  Language- repeat 1  Language- follow 3 step command 3  Language- read & follow direction 1  Write a sentence 1  Copy design 1  Total score 28   Neurological examination  Mentation: Alert oriented to time, place, history taking. Follows all commands speech and language fluent, flat Cranial  nerve II-XII: Pupils were equal round reactive to light. Extraocular movements were full, visual field were full on confrontational test. Facial sensation and strength were normal. Head turning and shoulder shrug  were normal and symmetric. Motor: The motor testing reveals 5 over 5 strength of all 4 extremities. Good symmetric motor tone is noted throughout. Mild resting tremor left hand. Sensory: Sensory testing is intact to soft touch on all 4 extremities. No evidence of extinction is noted.  Coordination: Cerebellar testing reveals good finger-nose-finger and heel-to-shin bilaterally.  Gait and station: Gait is slightly wide-based, but steady and independent Reflexes: Deep tendon reflexes are symmetric but decreased throughout  DIAGNOSTIC DATA (LABS, IMAGING, TESTING) - I reviewed patient records, labs, notes, testing and imaging myself where available.  Lab Results  Component Value Date   WBC 5.1 09/22/2021   HGB 12.0 09/22/2021   HCT 34.9 (L) 09/22/2021   MCV 82.7 09/22/2021   PLT 228 09/22/2021      Component Value Date/Time   NA 137 09/22/2021 0102   NA 137 11/06/2020 1524   K 3.8 09/22/2021 0102   CL 103 09/22/2021 0102   CO2 28 09/22/2021 0102   GLUCOSE 132 (H) 09/22/2021 0102   BUN 24 (H) 09/22/2021 0102   BUN 23 11/06/2020 1524   CREATININE 1.44 (H) 09/22/2021 0102   CREATININE 1.12 (H) 02/18/2020 1146   CALCIUM 8.8 (L) 09/22/2021 0102   PROT 6.1 (L) 09/17/2021 0145   PROT 6.9 11/06/2020 1524   ALBUMIN 3.1 (L) 09/17/2021 0145   ALBUMIN 4.1 11/06/2020 1524   AST 13 (L) 09/17/2021 0145   ALT 14 09/17/2021 0145   ALKPHOS 69 09/17/2021 0145   BILITOT 0.7 09/17/2021 0145   BILITOT 0.4 11/06/2020 1524   GFRNONAA 41 (L) 09/22/2021 0102   GFRNONAA 54 (L) 02/18/2020 1146   GFRAA 62 11/06/2020 1524   GFRAA 62 02/18/2020 1146   Lab Results  Component Value Date   CHOL 299 (H) 09/16/2021   HDL 36 (L) 09/16/2021   LDLCALC 226 (H) 09/16/2021   TRIG 183 (H) 09/16/2021    CHOLHDL 8.3 09/16/2021   Lab Results  Component Value  Date   HGBA1C 8.9 (H) 09/16/2021   No results found for: FHSVEXOG00 Lab Results  Component Value Date   TSH 1.089 09/15/2021    ASSESSMENT AND PLAN 61 y.o. year old female   1.  Stroke, left thalamic lacunar infarct with petechial hemorrhage -Likely secondary to small vessel disease, recommend aggressive risk factor modification with PCP -Continue Plavix 75 mg daily -Moving to [place] next [place], recommend PT/OT/ST -Since stroke daughter reports memory trouble, word finding trouble, MMSE 28/30 today, speech is fluent today  2.  Hypertension -On metoprolol, amlodipine, goal < 130/80 -At goal at home, recommend continuing to monitor, has lost 15 lbs  3.  Hyperlipidemia -LDL 226, goal < 70 -On Crestor, Zetia  4.  Type 2 diabetes, uncontrolled -A1c 8.9, goal < 7.0 -On insulin, glipizide  5.  Morbid obesity -Discussed the importance of weight loss and exercise -Down 15 lbs, commended for this  Discussed any new neurological or strokelike symptoms to report to the ER immediately.  Will return to our office for new issues.  Butler Denmark, AGNP-C, DNP 12/03/2021, 10:35 AM [facility] [address], [place], [place] [address] [phone]

## 2021-12-03 NOTE — Patient Instructions (Signed)
Great to see you today  ?Continue Plavix daily ?Continue management of vascular risk factors ?Return back as needed  ?

## 2021-12-04 NOTE — Progress Notes (Signed)
I agree with the above plan 

## 2021-12-10 ENCOUNTER — Telehealth: Payer: Self-pay | Admitting: Neurology

## 2021-12-10 NOTE — Telephone Encounter (Signed)
Pt's daughter, [name] ([place]) asking if physician can write a order for my mother to get PT, OT, ST.  Had discussed at last office visit. Pt has been moved to a facility, [facility]. Can fax order to: [phone]. Would like a call from the  nurse. ?

## 2022-01-06 ENCOUNTER — Telehealth (INDEPENDENT_AMBULATORY_CARE_PROVIDER_SITE_OTHER): Payer: Medicare HMO | Admitting: Psychiatry

## 2022-01-06 ENCOUNTER — Encounter (HOSPITAL_COMMUNITY): Payer: Self-pay | Admitting: Psychiatry

## 2022-01-06 DIAGNOSIS — F312 Bipolar disorder, current episode manic severe with psychotic features: Secondary | ICD-10-CM | POA: Diagnosis not present

## 2022-01-06 NOTE — Progress Notes (Signed)
Virtual Visit via Video Note ? ?I connected with [name] on 01/06/22 at  4:20 PM EDT by a video enabled telemedicine application and verified that I am speaking with the correct person using two identifiers. ? ?Location: ?Patient: home ?Provider: office ?  ?I discussed the limitations of evaluation and management by telemedicine and the availability of in person appointments. The patient expressed understanding and agreed to proceed. ? ? ? ?  ?I discussed the assessment and treatment plan with the patient. The patient was provided an opportunity to ask questions and all were answered. The patient agreed with the plan and demonstrated an understanding of the instructions. ?  ?The patient was advised to call back or seek an in-person evaluation if the symptoms worsen or if the condition fails to improve as anticipated. ? ?I provided 25 minutes of non-face-to-face time during this encounter. ? ? ?Maria Spiller, MD ? ?BH MD/PA/NP OP Progress Note ? ?01/06/2022 4:31 PM ?[name]  ?MRN:  [id number] ? ?Chief Complaint:  ?Chief Complaint  ?Patient presents with  ? Depression  ? Anxiety  ? Follow-up  ? ?HPI: This patient is a 61 year old widowed white female who is now living in an assisted living facility.  She has 2 grown daughters.  She is on disability for back pain and bipolar disorder. ? ?The patient returns for follow-up after a long absence.  She was last seen about a year ago.  Since then she suffered a thalamic stroke in December.  She had lost some speech Beach word finding abilities and other cognitive abilities.  She went home with a home health aide but this was really not enough.  Over the last 3 weeks she has moved into the assisted living facility in [place].  She is here with her daughter today. ? ?In terms of mood the patient has stayed on the same medications.  She thinks she is fairly stable.  Her affect is somewhat blank which may be somewhat due to the stroke.  She is scheduled to  have occupational therapy start working with her.  She is still spending a lot of time in bed as she did at home.  She is eating healthier and has lost 15 pounds.  She states she sleeps fairly well.  Her mood is okay but not great.  She denies any thoughts of self-harm or suicide.  She denies auditory visual hallucinations or paranoia.  Her short-term memory and word finding have been somewhat impaired. ?Visit Diagnosis:  ?  ICD-10-CM   ?1. Bipolar I disorder, current or most recent episode manic, with psychotic features with mixed features ([place])  F31.2   ?  ? ? ?Past Psychiatric History: 4 previous hospitalizations for bipolar disorder, long-term outpatient treatment ? ?Past Medical History:  ?Past Medical History:  ?Diagnosis Date  ? Asthma   ? Back pain   ? CAD (coronary artery disease)   ? a. 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   ? Chest pain   ? Diabetes mellitus type II   ? Headache   ? High cholesterol   ? Hypertension   ? Obesity, morbid ([place])   ? Other and unspecified bipolar disorders   ? 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 Thyroid disease   ?  ?Past Surgical History:  ?Procedure Laterality Date  ? TUBAL LIGATION    ? Bilateral  ? ? ?  Family Psychiatric History: see below ? ?Family History:  ?Family History  ?Problem Relation Age of Onset  ? Heart attack Maternal Uncle   ? Depression Maternal Uncle   ? Depression Mother   ? Alcohol abuse Maternal Grandfather   ? Alcohol abuse Maternal Uncle   ? Depression Maternal Grandmother   ? ? ?Social History:  ?Social History  ? ?Socioeconomic History  ? Marital status: Widowed  ?  Spouse name: Not on file  ? Number of children: Not on file  ? Years of education: Not on file  ? Highest education level: Not on file  ?Occupational History  ? Occupation: Disabled  ?  Employer: UNEMPLOYED  ?Tobacco Use  ? Smoking status: Never  ? Smokeless  tobacco: Never  ?Vaping Use  ? Vaping Use: Never used  ?Substance and Sexual Activity  ? Alcohol use: No  ?  Alcohol/week: 0.0 standard drinks  ? Drug use: No  ?  Comment: 05/13/2016 per pt no  ? Sexual activity: Yes  ?  Birth control/protection: Surgical  ?Other Topics Concern  ? Not on file  ?Social History Narrative  ? Married  ? No regular exercise  ? ?Social Determinants of Health  ? ?Financial Resource Strain: Not on file  ?Food Insecurity: Not on file  ?Transportation Needs: Not on file  ?Physical Activity: Not on file  ?Stress: Not on file  ?Social Connections: Not on file  ? ? ?Allergies:  ?Allergies  ?Allergen Reactions  ? Amoxicillin Swelling and Other (See Comments)  ?  Oral swelling ?  ? Doxycycline Nausea And Vomiting  ? Levofloxacin Other (See Comments)  ?  headache  ? Propoxyphene N-Acetaminophen Nausea And Vomiting  ? ? ?Metabolic Disorder Labs: ?Lab Results  ?Component Value Date  ? HGBA1C 8.9 (H) 09/16/2021  ? MPG 208.73 09/16/2021  ? MPG 211.6 09/16/2021  ? ?No results found for: PROLACTIN ?Lab Results  ?Component Value Date  ? CHOL 299 (H) 09/16/2021  ? TRIG 183 (H) 09/16/2021  ? HDL 36 (L) 09/16/2021  ? CHOLHDL 8.3 09/16/2021  ? VLDL 37 09/16/2021  ? LDLCALC 226 (H) 09/16/2021  ? LDLCALC 243 (H) 11/06/2020  ? ?Lab Results  ?Component Value Date  ? TSH 1.089 09/15/2021  ? TSH 1.750 11/06/2020  ? ? ?Therapeutic Level Labs: ?Lab Results  ?Component Value Date  ? LITHIUM 0.6 (L) 07/07/2016  ? LITHIUM 0.6 (L) 03/10/2016  ? ?No results found for: VALPROATE ?No components found for:  CBMZ ? ?Current Medications: ?Current Outpatient Medications  ?Medication Sig Dispense Refill  ? ACCU-CHEK GUIDE test strip USE TO TEST BLOOD SUGAR 4 TIMES DAILY. 100 strip 0  ? albuterol (PROVENTIL HFA;VENTOLIN HFA) 108 (90 Base) MCG/ACT inhaler Inhale 2 puffs into the lungs every 6 (six) hours as needed for wheezing or shortness of breath. 1 Inhaler 0  ? amLODipine (NORVASC) 10 MG tablet Take 1 tablet (10 mg total) by  mouth daily. For high blood pressure 30 tablet 0  ? BD PEN NEEDLE NANO U/F 32G X 4 MM MISC USE AS DIRECTED TWICE DAILY 100 each 0  ? blood glucose meter kit and supplies KIT 1 each by Does not apply route 4 (four) times daily. Dispense based on patient and insurance preference. Use up to four times daily as directed. (FOR ICD-9 250.00, 250.01). 1 each 0  ? busPIRone (BUSPAR) 30 MG tablet Take 1 tablet (30 mg total) by mouth 3 (three) times daily. 270 tablet 2  ? Cholecalciferol (VITAMIN D3) 125  MCG (5000 UT) CAPS Take 1 capsule (5,000 Units total) by mouth daily. 90 capsule 0  ? clopidogrel (PLAVIX) 75 MG tablet Take 1 tablet (75 mg total) by mouth daily. 30 tablet 0  ? DULoxetine (CYMBALTA) 60 MG capsule Take 1 capsule (60 mg total) by mouth 2 (two) times daily. 60 capsule 3  ? ezetimibe (ZETIA) 10 MG tablet Take 1 tablet (10 mg total) by mouth daily. 30 tablet 0  ? furosemide (LASIX) 20 MG tablet Take 1 tablet (20 mg total) by mouth daily. 30 tablet   ? glipiZIDE (GLUCOTROL) 10 MG tablet Take 1 tablet (10 mg total) by mouth 2 (two) times daily before a meal. 60 tablet 0  ? hydrOXYzine (VISTARIL) 25 MG capsule TAKE 1 CAPSULE THREE TIMES DAILY AS NEEDED 90 capsule 2  ? insulin isophane & regular human KwikPen (NOVOLIN 70/30 KWIKPEN) (70-30) 100 UNIT/ML KwikPen Inject 25 Units into the skin 2 (two) times daily before lunch and supper.    ? levothyroxine (SYNTHROID, LEVOTHROID) 50 MCG tablet Take 1 tablet (50 mcg total) by mouth daily before breakfast. For hypothyroidism 30 tablet 0  ? metoprolol succinate (TOPROL-XL) 25 MG 24 hr tablet TAKE 1 TABLET EVERY DAY (NEED CARDIOLOGY APPOINTMENT FOR REFILLS) (Patient taking differently: Take 25 mg by mouth daily.) 30 tablet 6  ? nitroGLYCERIN (NITROSTAT) 0.4 MG SL tablet Place 1 tablet (0.4 mg total) under the tongue every 5 (five) minutes as needed for chest pain. 25 tablet 3  ? risperiDONE (RISPERDAL) 2 MG tablet Take 1 tablet (2 mg total) by mouth 2 (two) times daily. 180  tablet 2  ? rizatriptan (MAXALT) 5 MG tablet Take 5 mg by mouth daily as needed for migraine.    ? rosuvastatin (CRESTOR) 40 MG tablet Take 1 tablet (40 mg total) by mouth daily. 90 tablet 3  ? Vitamin

## 2022-04-29 ENCOUNTER — Ambulatory Visit: Payer: Medicare HMO | Admitting: Cardiology

## 2022-04-29 ENCOUNTER — Ambulatory Visit: Payer: Medicare HMO | Admitting: Student

## 2022-07-20 ENCOUNTER — Ambulatory Visit: Payer: Medicare HMO | Attending: Internal Medicine | Admitting: Internal Medicine

## 2022-07-20 ENCOUNTER — Encounter: Payer: Self-pay | Admitting: Internal Medicine

## 2022-07-20 VITALS — BP 126/74 | HR 63 | Ht 65.0 in | Wt 287.4 lb

## 2022-07-20 DIAGNOSIS — Z7902 Long term (current) use of antithrombotics/antiplatelets: Secondary | ICD-10-CM

## 2022-07-20 DIAGNOSIS — I10 Essential (primary) hypertension: Secondary | ICD-10-CM | POA: Diagnosis not present

## 2022-07-20 MED ORDER — EZETIMIBE 10 MG PO TABS
10.0000 mg | ORAL_TABLET | Freq: Every day | ORAL | 3 refills | Status: DC
Start: 2022-07-20 — End: 2023-06-20

## 2022-07-20 NOTE — Addendum Note (Signed)
Addended by: Levonne Hubert on: 07/20/2022 04:59 PM   Modules accepted: Orders

## 2022-07-20 NOTE — Patient Instructions (Signed)
Medication Instructions:   Zetia 10 mg Daily   *If you need a refill on your cardiac medications before your next appointment, please call your pharmacy*   Lab Work: NONE   If you have labs (blood work) drawn today and your tests are completely normal, you will receive your results only by: [facility] (if you have MyChart) OR A paper copy in the mail If you have any lab test that is abnormal or we need to change your treatment, we will call you to review the results.   Testing/Procedures: NONE    Follow-Up: At [facility], you and your health needs are our priority.  As part of our continuing mission to provide you with exceptional heart care, we have created designated Provider Care Teams.  These Care Teams include your primary Cardiologist (physician) and Advanced Practice Providers (APPs -  Physician Assistants and Nurse Practitioners) who all work together to provide you with the care you need, when you need it.  We recommend signing up for the patient portal called "MyChart".  Sign up information is provided on this After Visit Summary.  MyChart is used to connect with patients for Virtual Visits (Telemedicine).  Patients are able to view lab/test results, encounter notes, upcoming appointments, etc.  Non-urgent messages can be sent to your provider as well.   To learn more about what you can do with MyChart, go to [email or web address].    Your next appointment:   6 month(s)  The format for your next appointment:   In Person  Provider:   Dr. Dellia Cloud      Other Instructions Please call office regarding PSK9.   Thank you for choosing [facility]!    Important Information About Sugar

## 2022-07-20 NOTE — Progress Notes (Signed)
Cardiology Office Note  Date: 07/20/2022   ID: CYDNIE DEASON, DOB 04-13-1961, MRN [id number]  PCP:  Christain Sacramento, MD  Cardiologist:  Kate Sable, MD (Inactive) Electrophysiologist:  None   Reason for Office Visit: Follow-up of CAD status post CABG   History of Present Illness: [name] is a 61 y.o. female known to have CAD status post CABG in 2019 (SVG to PDA, SVG to RI, sequential LIMA to mid LAD to distal LAD), HTN, HLD, IDDM, CVA Hx in 2022 presented to the cardiology clinic for follow-up visit.  Accompanied by daughter. Patient denied any rest or exertional chest discomfort, tightness, heaviness or pressure, rest or exertional dyspnea, palpitations, light-headedness, syncope and LE swelling. Compliant with medications and no side-effects. No bleeding complications. Denied smoking cigarettes.  Currently on rosuvastatin 40 mg nightly. She was prescribed Zetia 10 mg once daily in December 2022 for only 30-day supply with no refills. No muscle cramps.  Past Medical History:  Diagnosis Date   Asthma    Back pain    CAD (coronary artery disease)    a. 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 1 Inhaler 0   amLODipine (NORVASC) 10 MG tablet Take 1 tablet (10 mg total) by mouth daily. For high blood  pressure 30 tablet 0   BD PEN NEEDLE NANO U/F 32G X 4 MM MISC USE AS DIRECTED TWICE DAILY 100 each 0   benzonatate (TESSALON) 100 MG capsule Take by mouth 2 (two) times daily as needed for cough.     blood glucose meter kit and supplies KIT 1 each by Does not apply route 4 (four) times daily. Dispense based on patient and insurance preference. Use up to four times daily as directed. (FOR ICD-9 250.00, 250.01). 1 each 0   busPIRone (BUSPAR) 5 MG tablet Take 5 mg by mouth 3 (three) times daily.     Cholecalciferol (VITAMIN D3) 125 MCG (5000 UT) CAPS Take 1 capsule (5,000 Units total) by mouth daily. 90 capsule 0   clopidogrel (PLAVIX) 75 MG tablet Take 1 tablet (75 mg total) by mouth daily. 30 tablet 0   Docusate Sodium (DSS) 100 MG CAPS Take one pill every 3 days     ezetimibe (ZETIA) 10 MG tablet Take 1 tablet (10 mg total) by mouth daily. 90 tablet 3   fluticasone (FLONASE) 50 MCG/ACT nasal spray Place into both nostrils.     furosemide (LASIX) 20 MG tablet Take 1 tablet (20 mg total) by mouth daily. 30 tablet    glipiZIDE (GLUCOTROL) 10 MG tablet Take 1 tablet (10 mg total) by mouth 2 (two) times daily before a meal. 60 tablet 0   hydrOXYzine (ATARAX) 50 MG tablet Take by mouth.  insulin isophane & regular human KwikPen (NOVOLIN 70/30 KWIKPEN) (70-30) 100 UNIT/ML KwikPen Inject 25 Units into the skin 2 (two) times daily before lunch and supper.     JANUVIA 25 MG tablet Take 25 mg by mouth daily.     levothyroxine (SYNTHROID, LEVOTHROID) 50 MCG tablet Take 1 tablet (50 mcg total) by mouth daily before breakfast. For hypothyroidism 30 tablet 0   metoprolol succinate (TOPROL-XL) 25 MG 24 hr tablet TAKE 1 TABLET EVERY DAY (NEED CARDIOLOGY APPOINTMENT FOR REFILLS) (Patient taking differently: Take 25 mg by mouth daily.) 30 tablet 6   montelukast (SINGULAIR) 10 MG tablet Take 10 mg by mouth daily.     nitroGLYCERIN (NITROSTAT) 0.4 MG SL tablet Place 1 tablet (0.4 mg total) under the tongue every 5  (five) minutes as needed for chest pain. 25 tablet 3   potassium chloride (KLOR-CON) 10 MEQ tablet Take 10 mEq by mouth daily.     risperiDONE (RISPERDAL) 1 MG tablet SMARTSIG:1 Tablet(s) By Mouth     rizatriptan (MAXALT) 5 MG tablet Take 5 mg by mouth daily as needed for migraine.     rosuvastatin (CRESTOR) 40 MG tablet Take 1 tablet (40 mg total) by mouth daily. 90 tablet 3   traZODone (DESYREL) 50 MG tablet Take by mouth.     Vitamin D, Ergocalciferol, (DRISDOL) 1.25 MG (50000 UNIT) CAPS capsule Take 1 capsule (50,000 Units total) by mouth every 7 (seven) days. 12 capsule 0   ACCU-CHEK GUIDE test strip USE TO TEST BLOOD SUGAR 4 TIMES DAILY. 100 strip 0   DULoxetine (CYMBALTA) 60 MG capsule Take 1 capsule (60 mg total) by mouth 2 (two) times daily. 60 capsule 3   No current facility-administered medications for this visit.   Allergies:  Amoxicillin, Doxycycline, Levofloxacin, and Propoxyphene n-acetaminophen   Social History: The patient  reports that she has never smoked. She has never used smokeless tobacco. She reports that she does not drink alcohol and does not use drugs.   Family History: The patient's family history includes Alcohol abuse in her maternal grandfather and maternal uncle; Depression in her maternal grandmother, maternal uncle, and mother; Heart attack in her maternal uncle.   ROS:  Please see the history of present illness. Otherwise, complete review of systems is positive for none.  All other systems are reviewed and negative.   Physical Exam: VS:  BP 126/74   Pulse 63   Ht 5' 5"  (1.651 m)   Wt 287 lb 6.4 oz (130.4 kg)   SpO2 98%   BMI 47.83 kg/m , BMI Body mass index is 47.83 kg/m.  Wt Readings from Last 3 Encounters:  07/20/22 287 lb 6.4 oz (130.4 kg)  12/03/21 270 lb (122.5 kg)  11/11/21 270 lb (122.5 kg)    General: Patient appears comfortable at rest. HEENT: Conjunctiva and lids normal, oropharynx clear with moist mucosa. Neck: Supple, no elevated JVP  or carotid bruits, no thyromegaly. Lungs: Clear to auscultation, nonlabored breathing at rest. Cardiac: Regular rate and rhythm, no S3 or significant systolic murmur, no pericardial rub. Abdomen: Soft, nontender, no hepatomegaly, bowel sounds present, no guarding or rebound. Extremities: No pitting edema, distal pulses 2+. Skin: Warm and dry. Musculoskeletal: No kyphosis. Neuropsychiatric: Alert and oriented x3, affect grossly appropriate.  ECG:  An ECG dated 07/20/2022 was personally reviewed today and demonstrated:  Normal sinus rhythm, Q waves in inferior leads  Recent Labwork: 09/15/2021: B Natriuretic Peptide 82.9; TSH 1.089 09/17/2021: ALT 14; AST 13 09/22/2021: BUN 24; Creatinine,  Ser 1.44; Hemoglobin 12.0; Magnesium 2.0; Platelets 228; Potassium 3.8; Sodium 137     Component Value Date/Time   CHOL 299 (H) 09/16/2021 0624   CHOL 350 (H) 11/06/2020 1524   TRIG 183 (H) 09/16/2021 0624   HDL 36 (L) 09/16/2021 0624   HDL 44 11/06/2020 1524   CHOLHDL 8.3 09/16/2021 0624   VLDL 37 09/16/2021 0624   LDLCALC 226 (H) 09/16/2021 0624   LDLCALC 243 (H) 11/06/2020 1524   LDLCALC 163 (H) 01/30/2019 0934    Other Studies Reviewed Today:   Assessment and Plan: Patient is a 61 year old F known to have CAD status post CABG in 2019 (SVG to PDA, SVG to RI, sequential LIMA to mid LAD to distal LAD), HTN, HLD, IDDM, CVA Hx in 2022 presented to the cardiology clinic for follow-up visit.  Accompanied by daughter.  #CAD status post CABG in 2019 (SVG to PDA, SVG to RI, sequential LIMA to mid LAD to distal LAD) with normal LVEF, currently angina free Plan -Continue Plavix 75 mg once daily -Continue atorvastatin 40 mg nightly and start Zetia 10 mg once daily.  Will benefit from PCSK9 inhibitors or Inclisiran. See below. -SL NTG 0.4 mg as needed  #Severe hypercholesterolemia (LDL 220s -->163), goal LDL less than 55 Plan -Continue rosuvastatin 40 mg nightly and start Zetia 10 mg once daily.  Patient will benefit from PCSK9 inhibitors or Inclisiran. Patient's daughter stated that she would like to talk to [place] about the co-pay for each of these injectables and get back to [place] for paper print prescriptions.  #CVA in 2022 Plan -Continue Plavix 75 mg once daily -Continue atorvastatin 40 mg nightly and start Zetia 10 mg once daily  #HTN, controlled Plan -Continue metoprolol succinate 25 mg once daily  I have spent a total of 35 minutes with patient reviewing chart, EKGs, labs and examining patient as well as establishing an assessment and plan that was discussed with the patient.  > 50% of time was spent in direct patient care.     Medication Adjustments/Labs and Tests Ordered: Current medicines are reviewed at length with the patient today.  Concerns regarding medicines are outlined above.   Tests Ordered: No orders of the defined types were placed in this encounter.   Medication Changes: Meds ordered this encounter  Medications   ezetimibe (ZETIA) 10 MG tablet    Sig: Take 1 tablet (10 mg total) by mouth daily.    Dispense:  90 tablet    Refill:  3    Disposition:  Follow up  6 months  Signed, Maria Taira Fidel Levy, MD, 07/20/2022 4:37 PM    [facility] at [facility] [address], [place], [place] [address]

## 2022-10-18 ENCOUNTER — Emergency Department (HOSPITAL_COMMUNITY)
Admission: EM | Admit: 2022-10-18 | Discharge: 2022-10-18 | Disposition: A | Discharge status: Skilled Nursing Facility | Payer: Medicare HMO | Attending: Emergency Medicine | Admitting: Emergency Medicine

## 2022-10-18 ENCOUNTER — Emergency Department (HOSPITAL_COMMUNITY): Payer: Medicare HMO

## 2022-10-18 ENCOUNTER — Encounter (HOSPITAL_COMMUNITY): Payer: Self-pay | Admitting: Emergency Medicine

## 2022-10-18 ENCOUNTER — Other Ambulatory Visit: Payer: Self-pay

## 2022-10-18 DIAGNOSIS — R42 Dizziness and giddiness: Secondary | ICD-10-CM | POA: Diagnosis not present

## 2022-10-18 DIAGNOSIS — E119 Type 2 diabetes mellitus without complications: Secondary | ICD-10-CM | POA: Diagnosis not present

## 2022-10-18 DIAGNOSIS — I10 Essential (primary) hypertension: Secondary | ICD-10-CM | POA: Insufficient documentation

## 2022-10-18 DIAGNOSIS — R109 Unspecified abdominal pain: Secondary | ICD-10-CM | POA: Diagnosis not present

## 2022-10-18 DIAGNOSIS — J45909 Unspecified asthma, uncomplicated: Secondary | ICD-10-CM | POA: Insufficient documentation

## 2022-10-18 DIAGNOSIS — Z7982 Long term (current) use of aspirin: Secondary | ICD-10-CM | POA: Diagnosis not present

## 2022-10-18 DIAGNOSIS — R079 Chest pain, unspecified: Secondary | ICD-10-CM

## 2022-10-18 DIAGNOSIS — M549 Dorsalgia, unspecified: Secondary | ICD-10-CM | POA: Insufficient documentation

## 2022-10-18 DIAGNOSIS — I2510 Atherosclerotic heart disease of native coronary artery without angina pectoris: Secondary | ICD-10-CM | POA: Diagnosis not present

## 2022-10-18 DIAGNOSIS — S39012A Strain of muscle, fascia and tendon of lower back, initial encounter: Secondary | ICD-10-CM

## 2022-10-18 LAB — TROPONIN I (HIGH SENSITIVITY)
Troponin I (High Sensitivity): 7 ng/L (ref <18)
Troponin I (High Sensitivity): 6 ng/L (ref <18)

## 2022-10-18 LAB — CBC
HCT: 33.9 % — ABNORMAL LOW (ref 36.0–46.0)
Hemoglobin: 11.3 g/dL — ABNORMAL LOW (ref 12.0–15.0)
MCH: 27.6 pg (ref 26.0–34.0)
MCHC: 33.3 g/dL (ref 30.0–36.0)
MCV: 82.9 fL (ref 80.0–100.0)
Platelets: 251 10*3/uL (ref 150–400)
RBC: 4.09 MIL/uL (ref 3.87–5.11)
RDW: 13.4 % (ref 11.5–15.5)
WBC: 6.2 10*3/uL (ref 4.0–10.5)
nRBC: 0 % (ref 0.0–0.2)

## 2022-10-18 LAB — URINALYSIS, ROUTINE W REFLEX MICROSCOPIC
Bacteria, UA: NONE SEEN
Bilirubin Urine: NEGATIVE
Glucose, UA: 50 mg/dL — AB
Hgb urine dipstick: NEGATIVE
Ketones, ur: NEGATIVE mg/dL
Nitrite: NEGATIVE
Protein, ur: 30 mg/dL — AB
Specific Gravity, Urine: 1.045 — ABNORMAL HIGH (ref 1.005–1.030)
pH: 5 (ref 5.0–8.0)

## 2022-10-18 LAB — BASIC METABOLIC PANEL
Anion gap: 8 (ref 5–15)
BUN: 23 mg/dL (ref 8–23)
CO2: 26 mmol/L (ref 22–32)
Calcium: 8.7 mg/dL — ABNORMAL LOW (ref 8.9–10.3)
Chloride: 100 mmol/L (ref 98–111)
Creatinine, Ser: 0.99 mg/dL (ref 0.44–1.00)
GFR, Estimated: >60 mL/min (ref >60)
Glucose, Bld: 243 mg/dL — ABNORMAL HIGH (ref 70–99)
Potassium: 3.9 mmol/L (ref 3.5–5.1)
Sodium: 134 mmol/L — ABNORMAL LOW (ref 135–145)

## 2022-10-18 MED ORDER — PREDNISONE 20 MG PO TABS: ORAL_TABLET | ORAL | 0 refills | Status: DC

## 2022-10-18 MED ORDER — IOHEXOL 350 MG/ML SOLN
100.0000 mL | Freq: Once | INTRAVENOUS | Status: AC | PRN
  Administered 2022-10-18: 100 mL via INTRAVENOUS

## 2022-10-18 MED ORDER — ONDANSETRON 4 MG PO TBDP: ORAL_TABLET | ORAL | 0 refills | Status: DC

## 2022-10-18 MED ORDER — HYDROMORPHONE HCL 1 MG/ML IJ SOLN
0.5000 mg | Freq: Once | INTRAMUSCULAR | Status: AC
  Administered 2022-10-18: 0.5 mg via INTRAVENOUS
  Filled 2022-10-18: qty 0.5

## 2022-10-18 MED ORDER — MORPHINE SULFATE (PF) 4 MG/ML IV SOLN
4.0000 mg | Freq: Once | INTRAVENOUS | Status: AC
  Administered 2022-10-18: 4 mg via INTRAVENOUS
  Filled 2022-10-18: qty 1

## 2022-10-18 MED ORDER — ONDANSETRON 4 MG PO TBDP: ORAL_TABLET | ORAL | 0 refills | Status: AC

## 2022-10-18 MED ORDER — PREDNISONE 20 MG PO TABS: ORAL_TABLET | ORAL | 0 refills | Status: AC

## 2022-10-18 MED ORDER — OXYCODONE-ACETAMINOPHEN 5-325 MG PO TABS: 1.0000 | ORAL_TABLET | Freq: Four times a day (QID) | ORAL | 0 refills | Status: DC | PRN

## 2022-10-18 MED ORDER — ASPIRIN 325 MG PO TABS
325.0000 mg | ORAL_TABLET | Freq: Once | ORAL | Status: AC
  Administered 2022-10-18: 325 mg via ORAL
  Filled 2022-10-18: qty 1

## 2022-10-18 NOTE — Discharge Instructions (Signed)
Rest at home for a few days for.  Follow-up with your family doctor either the end of this week or beginning of next week for recheck

## 2022-10-18 NOTE — ED Provider Notes (Signed)
[facility] Emergency Department Provider Note MRN:  [id number]  Arrival date & time: 10/18/22     Chief Complaint   Back Pain and Chest Pain   History of Present Illness   [name] is a 62 y.o. year-old female with a history of CAD, diabetes presenting to the ED with chief complaint of back pain and chest pain.  Right flank pain for the past week, has been severe and constant.  This evening with sharp central chest pain associated with lightheadedness.  Has had a heart attack with this feels different.  Denies shortness of breath.  Review of Systems  A thorough review of systems was obtained and all systems are negative except as noted in the HPI and PMH.   Patient's Health History    Past Medical History:  Diagnosis Date   Asthma    Back pain    CAD (coronary artery disease)    a. 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   Thyroid disease     Past Surgical History:  Procedure Laterality Date   TUBAL LIGATION     Bilateral    Family History  Problem Relation Age of Onset   Heart attack Maternal Uncle    Depression Maternal Uncle    Depression Mother    Alcohol abuse Maternal Grandfather    Alcohol abuse Maternal Uncle    Depression Maternal Grandmother     Social History   Socioeconomic History   Marital status: Widowed    Spouse name: Not on file   Number of children: Not on file   Years of education: Not on file   Highest education level: Not on file  Occupational History   Occupation: Disabled    Employer: UNEMPLOYED  Tobacco Use   Smoking status: Never   Smokeless tobacco: Never  Vaping Use    Vaping Use: Never used  Substance and Sexual Activity   Alcohol use: No    Alcohol/week: 0.0 standard drinks of alcohol   Drug use: No    Comment: 05/13/2016 per pt no   Sexual activity: Yes    Birth control/protection: Surgical  Other Topics Concern   Not on file  Social History Narrative   Married   No regular exercise   Social Determinants of Health   Financial Resource Strain: Not on file  Food Insecurity: Not on file  Transportation Needs: Not on file  Physical Activity: Not on file  Stress: Not on file  Social Connections: Not on file  Intimate Partner Violence: Not on file     Physical Exam   Vitals:   10/18/22 0600 10/18/22 0700  BP: (!) 144/65 133/66  Pulse: (!) 52 (!) 54  Resp: 15 13  Temp:    SpO2: 97% 100%    CONSTITUTIONAL: Well-appearing, NAD NEURO/PSYCH:  Alert and oriented x 3, no focal deficits EYES:  eyes equal and reactive ENT/NECK:  no LAD, no JVD CARDIO: Regular rate, well-perfused, normal S1 and S2 PULM:  CTAB no wheezing or rhonchi GI/GU:  non-distended, non-tender MSK/SPINE:  No gross deformities, no edema SKIN:  no rash, atraumatic   *  Additional and/or pertinent findings included in MDM below  Diagnostic and Interventional Summary    EKG Interpretation  Date/Time:  Monday October 18 2022 03:41:27 EST Ventricular Rate:  52 PR Interval:  204 QRS Duration: 106 QT Interval:  454 QTC Calculation: 423 R Axis:   53 Text Interpretation: Sinus rhythm Nonspecific T abnormalities, lateral leads Baseline wander in lead(s) V2 Confirmed by Gerlene Fee [phone]) on 10/18/2022 4:02:25 AM       Labs Reviewed  BASIC METABOLIC PANEL - Abnormal; Notable for the following components:      Result Value   Sodium 134 (*)    Glucose, Bld 243 (*)    Calcium 8.7 (*)    All other components within normal limits  CBC - Abnormal; Notable for the following components:   Hemoglobin 11.3 (*)    HCT 33.9 (*)    All other components within normal limits   URINALYSIS, ROUTINE W REFLEX MICROSCOPIC  TROPONIN I (HIGH SENSITIVITY)  TROPONIN I (HIGH SENSITIVITY)    CT Angio Chest Pulmonary Embolism (PE) W or WO Contrast  Final Result    CT ABDOMEN PELVIS W CONTRAST  Final Result    DG Chest Portable 1 View  Final Result      Medications  morphine (PF) 4 MG/ML injection 4 mg (4 mg Intravenous Given 10/18/22 0413)  aspirin tablet 325 mg (325 mg Oral Given 10/18/22 0412)  iohexol (OMNIPAQUE) 350 MG/ML injection 100 mL (100 mLs Intravenous Contrast Given 10/18/22 0533)     Procedures  /  Critical Care Procedures  ED Course and Medical Decision Making  Initial Impression and Ddx Differential diagnosis includes kidney stone, PE, ACS, GERD, less likely dissection.  Awaiting labs, CT imaging.  Past medical/surgical history that increases complexity of ED encounter: CAD  Interpretation of Diagnostics I personally reviewed the EKG and my interpretation is as follows: Sinus rhythm without ischemic changes from prior  Labs reassuring with no significant blood count or electrolyte disturbance, troponin negative x 2.  CT imaging is without acute process.  Patient Reassessment and Ultimate Disposition/Management     Patient is feeling better, suspect MSK back pain, suspect noncardiac chest pain.  If urinalysis and troponin on repeat are reassuring patient is appropriate for discharge.  She will follow-up with her cardiologist regarding her chest pain.  Signed out to oncoming provider at shift change.  Patient management required discussion with the following services or consulting groups:  None  Complexity of Problems Addressed Acute illness or injury that poses threat of life of bodily function  Additional Data Reviewed and Analyzed Further history obtained from: Further history from spouse/family member  Additional Factors Impacting ED Encounter Risk Use of parenteral controlled substances  Barth Kirks. [place] Small, [place] [email or web address]  Final Clinical Impressions(s) / ED Diagnoses     ICD-10-CM   1. Chest pain, unspecified type  R07.9     2. Flank pain  R10.9       ED Discharge Orders     None        Discharge Instructions Discussed with and Provided to Patient:   Discharge Instructions   None      Maudie Flakes, MD 10/18/22 [phone]

## 2022-10-18 NOTE — ED Triage Notes (Signed)
Pt with c/o lower back pain x 1 week and sudden onset of L sided chest pain tonight. Pt has been taking Tramadol for pain but states that it "isn't helping". Blood glucose per EMS was 277.

## 2022-10-18 NOTE — ED Notes (Signed)
Patient transported to CT 

## 2023-01-20 ENCOUNTER — Ambulatory Visit: Payer: Medicare HMO | Admitting: Student

## 2023-02-01 ENCOUNTER — Encounter: Payer: Self-pay | Admitting: Student

## 2023-02-01 ENCOUNTER — Ambulatory Visit: Payer: Medicare Other | Attending: Student | Admitting: Student

## 2023-02-01 VITALS — BP 122/70 | HR 50 | Ht 65.0 in | Wt 304.0 lb

## 2023-02-01 DIAGNOSIS — E785 Hyperlipidemia, unspecified: Secondary | ICD-10-CM | POA: Diagnosis not present

## 2023-02-01 DIAGNOSIS — I2510 Atherosclerotic heart disease of native coronary artery without angina pectoris: Secondary | ICD-10-CM

## 2023-02-01 DIAGNOSIS — Z8673 Personal history of transient ischemic attack (TIA), and cerebral infarction without residual deficits: Secondary | ICD-10-CM

## 2023-02-01 DIAGNOSIS — I10 Essential (primary) hypertension: Secondary | ICD-10-CM | POA: Diagnosis not present

## 2023-02-01 NOTE — Patient Instructions (Signed)
Medication Instructions:   Continue current cardiac medications.   *If you need a refill on your cardiac medications before your next appointment, please call your pharmacy*   Follow-Up: At [facility], you and your health needs are our priority.  As part of our continuing mission to provide you with exceptional heart care, we have created designated Provider Care Teams.  These Care Teams include your primary Cardiologist (physician) and Advanced Practice Providers (APPs -  Physician Assistants and Nurse Practitioners) who all work together to provide you with the care you need, when you need it.  We recommend signing up for the patient portal called "MyChart".  Sign up information is provided on this After Visit Summary.  MyChart is used to connect with patients for Virtual Visits (Telemedicine).  Patients are able to view lab/test results, encounter notes, upcoming appointments, etc.  Non-urgent messages can be sent to your provider as well.   To learn more about what you can do with MyChart, go to [email or web address].    Your next appointment:   6 month(s)  Provider:   You may see Vishnu P Mallipeddi, MD or one of the following Advanced Practice Providers on your designated Care Team:   [place], PA-C  Jacolyn Reedy, [place]

## 2023-02-01 NOTE — Progress Notes (Signed)
Cardiology Office Note    Date:  02/01/2023  ID:  [name], DOB 1961/07/28, MRN [id number] Cardiologist: Marjo Bicker, MD    History of Present Illness:    [name] is a 62 y.o. female with past medical history of CAD (s/p CABG in 07/2018 with SVG-PDA, SVG-RI, seq LIMA-mid-LAD-distal LAD), HTN, HLD, IDDM, hypothyroidism and prior CVA who presents to the office today for 77-month follow-up.  She was examined by Dr. Jenene Slicker in 06/2022 and denied any recent anginal symptoms at that time. Her LDL remained elevated, therefore she was continued on Crestor 40 mg daily and restarted on Zetia 10 mg daily with plans to consider a PCSK9 inhibitor or Inclisiran in the future.  In the interim, she was evaluated at [facility] ED in 09/2022 for chest pain. Troponin values were negative at that time and her pain was overall felt to be most consistent with musculoskeletal discomfort.  In talking with the patient and her daughter today, she reports things have overall been stable from a cardiac perspective since her last office visit. She does report occasional episodes of chest discomfort which have occurred since CABG but symptoms have not increased in frequency or severity and typically occur at rest. Previously worked with PT without issues. No specific orthopnea, PND or pitting edema. Her biggest issue at this time is back pain and she was recently diagnosed with a UTI and has been started on antibiotic therapy. 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 She does reside at [facility] here in [place].   Studies Reviewed:   EKG: EKG is not ordered today.   Echocardiogram: 08/2021 IMPRESSIONS     1. Left ventricular ejection fraction, by estimation, is 60 to 65%. The  left ventricle has normal function. The left ventricle has no regional  wall motion abnormalities. Left ventricular diastolic parameters are  consistent with Grade II diastolic  dysfunction  (pseudonormalization). Elevated left atrial pressure.   2. Right ventricular systolic function is normal. The right ventricular  size is normal. Tricuspid regurgitation signal is inadequate for assessing  PA pressure.   3. Left atrial size was mildly dilated.   4. The mitral valve is normal in structure. No evidence of mitral valve  regurgitation.   5. The aortic valve is tricuspid. There is mild calcification of the  aortic valve. There is mild thickening of the aortic valve. Aortic valve  regurgitation is not visualized. Aortic valve sclerosis is present, with  no evidence of aortic valve stenosis.   6. Agitated saline contrast bubble study was negative, with no evidence  of any interatrial shunt.     Physical Exam:   VS:  BP 122/70   Pulse (!) 50   Ht 5\' 5"  (1.651 m)   Wt (!) 304 lb (137.9 kg)   SpO2 96%   BMI 50.59 kg/m    Wt Readings from Last 3 Encounters:  02/01/23 (!) 304 lb (137.9 kg)  10/18/22 (!) 350 lb (158.8 kg)  07/20/22 287 lb 6.4 oz (130.4 kg)     GEN: Pleasant, obese female appearing in no acute distress NECK: No JVD; No carotid bruits CARDIAC: RRR, no murmurs, rubs, gallops RESPIRATORY:  Clear to auscultation without rales, wheezing or rhonchi  ABDOMEN: Appears non-distended. No obvious abdominal masses. EXTREMITIES: No clubbing or cyanosis. No pitting edema.  Distal pedal pulses are 2+ bilaterally.   Assessment and Plan:   1. CAD - She is s/p CABG in 07/2018 with SVG-PDA, SVG-RI, seq  LIMA-mid-LAD-distal LAD. She reports occasional episodes of chest pain which have occurred since CABG but no specific angina. We reviewed warning signs to monitor for and her daughter is going to make sure her SL NTG is in date. Continue Plavix 75mg  daily, Toprol-XL 50mg  daily, Zetia 10mg  daily and Crestor 40mg  daily.   2. HTN - Her BP is well-controlled at 122/70 during today's visit. Continue current medical therapy with Amlodipine 10mg  daily and Toprol-XL 50mg  daily.     3. HLD - Her LDL was significantly elevated at 187 when checked in 11/2021. She was started on Zetia at the time of her last office visit.  She does bring with her today a copy of recent labs from her PCP and cholesterol has significantly improved with total cholesterol at 119, triglycerides 110, HDL 46 and LDL 51. Continue current medical therapy with Zetia 10 mg daily and Crestor 40 mg daily.  4. History of CVA - She remains on Plavix 75mg  daily and statin therapy.    Signed, Ellsworth Lennox, PA-C

## 2023-02-05 ENCOUNTER — Emergency Department (HOSPITAL_COMMUNITY)
Admission: EM | Admit: 2023-02-05 | Discharge: 2023-02-05 | Disposition: A | Discharge status: Home / Self Care | Payer: Medicare HMO | Attending: Emergency Medicine | Admitting: Emergency Medicine

## 2023-02-05 ENCOUNTER — Encounter (HOSPITAL_COMMUNITY): Payer: Self-pay

## 2023-02-05 ENCOUNTER — Other Ambulatory Visit: Payer: Self-pay

## 2023-02-05 DIAGNOSIS — Z794 Long term (current) use of insulin: Secondary | ICD-10-CM | POA: Diagnosis not present

## 2023-02-05 DIAGNOSIS — M5432 Sciatica, left side: Secondary | ICD-10-CM | POA: Insufficient documentation

## 2023-02-05 DIAGNOSIS — Z7984 Long term (current) use of oral hypoglycemic drugs: Secondary | ICD-10-CM | POA: Diagnosis not present

## 2023-02-05 DIAGNOSIS — I2510 Atherosclerotic heart disease of native coronary artery without angina pectoris: Secondary | ICD-10-CM | POA: Diagnosis not present

## 2023-02-05 DIAGNOSIS — M545 Low back pain, unspecified: Secondary | ICD-10-CM | POA: Diagnosis present

## 2023-02-05 DIAGNOSIS — E119 Type 2 diabetes mellitus without complications: Secondary | ICD-10-CM | POA: Insufficient documentation

## 2023-02-05 LAB — CBC WITH DIFFERENTIAL/PLATELET
Abs Immature Granulocytes: 0.04 10*3/uL (ref 0.00–0.07)
Basophils Absolute: 0.1 10*3/uL (ref 0.0–0.1)
Basophils Relative: 1 %
Eosinophils Absolute: 0.3 10*3/uL (ref 0.0–0.5)
Eosinophils Relative: 4 %
HCT: 31.7 % — ABNORMAL LOW (ref 36.0–46.0)
Hemoglobin: 10.3 g/dL — ABNORMAL LOW (ref 12.0–15.0)
Immature Granulocytes: 1 %
Lymphocytes Relative: 21 %
Lymphs Abs: 1.4 10*3/uL (ref 0.7–4.0)
MCH: 26.6 pg (ref 26.0–34.0)
MCHC: 32.5 g/dL (ref 30.0–36.0)
MCV: 81.9 fL (ref 80.0–100.0)
Monocytes Absolute: 0.5 10*3/uL (ref 0.1–1.0)
Monocytes Relative: 7 %
Neutro Abs: 4.4 10*3/uL (ref 1.7–7.7)
Neutrophils Relative %: 66 %
Platelets: 271 10*3/uL (ref 150–400)
RBC: 3.87 MIL/uL (ref 3.87–5.11)
RDW: 14.4 % (ref 11.5–15.5)
WBC: 6.6 10*3/uL (ref 4.0–10.5)
nRBC: 0 % (ref 0.0–0.2)

## 2023-02-05 LAB — COMPREHENSIVE METABOLIC PANEL
ALT: 23 U/L (ref 0–44)
AST: 22 U/L (ref 15–41)
Albumin: 3.2 g/dL — ABNORMAL LOW (ref 3.5–5.0)
Alkaline Phosphatase: 67 U/L (ref 38–126)
Anion gap: 8 (ref 5–15)
BUN: 26 mg/dL — ABNORMAL HIGH (ref 8–23)
CO2: 25 mmol/L (ref 22–32)
Calcium: 8.5 mg/dL — ABNORMAL LOW (ref 8.9–10.3)
Chloride: 100 mmol/L (ref 98–111)
Creatinine, Ser: 1.18 mg/dL — ABNORMAL HIGH (ref 0.44–1.00)
GFR, Estimated: 52 mL/min — ABNORMAL LOW (ref >60)
Glucose, Bld: 347 mg/dL — ABNORMAL HIGH (ref 70–99)
Potassium: 4.2 mmol/L (ref 3.5–5.1)
Sodium: 133 mmol/L — ABNORMAL LOW (ref 135–145)
Total Bilirubin: 0.8 mg/dL (ref 0.3–1.2)
Total Protein: 6.5 g/dL (ref 6.5–8.1)

## 2023-02-05 LAB — CBG MONITORING, ED: Glucose-Capillary: 353 mg/dL — ABNORMAL HIGH (ref 70–99)

## 2023-02-05 LAB — LIPASE, BLOOD: Lipase: 41 U/L (ref 11–51)

## 2023-02-05 MED ORDER — OXYCODONE-ACETAMINOPHEN 5-325 MG PO TABS: 1.0000 | ORAL_TABLET | Freq: Four times a day (QID) | ORAL | 0 refills | Status: AC | PRN

## 2023-02-05 MED ORDER — HYDROMORPHONE HCL 1 MG/ML IJ SOLN
1.0000 mg | Freq: Once | INTRAMUSCULAR | Status: AC
  Administered 2023-02-05: 1 mg via INTRAMUSCULAR
  Filled 2023-02-05: qty 1

## 2023-02-05 MED ORDER — SODIUM CHLORIDE 0.9 % IV BOLUS
500.0000 mL | Freq: Once | INTRAVENOUS | Status: AC
  Administered 2023-02-05: 500 mL via INTRAVENOUS

## 2023-02-05 NOTE — ED Notes (Signed)
EMS here for pt

## 2023-02-05 NOTE — ED Triage Notes (Signed)
[facility] from [facility]. Abdominal pain x1 week. Hx of same since January. 401 sugar with ems. Per ems facility was supposed to  have an MRI done for her but they have not.  20l ac

## 2023-02-05 NOTE — ED Provider Notes (Signed)
[place] [facility] AT [facility] Provider Note   CSN: [id number] Arrival date & time: 02/05/23  2043     History  Chief Complaint  Patient presents with   Abdominal Pain    [name] is a 62 y.o. female.  Patient has history of diabetes and coronary disease.  She is complaining of back pain with pain radiating down her left leg she has had this before  The history is provided by the patient and medical records. No language interpreter was used.  Back Pain Location:  Lumbar spine Quality:  Aching Radiates to:  L posterior upper leg Pain severity:  Moderate Onset quality:  Sudden Timing:  Constant Progression:  Worsening Chronicity:  Chronic Associated symptoms: no abdominal pain, no chest pain and no headaches        Home Medications Prior to Admission medications   Medication Sig Start Date End Date Taking? Authorizing Provider  oxyCODONE-acetaminophen (PERCOCET/ROXICET) 5-325 MG tablet Take 1 tablet by mouth every 6 (six) hours as needed for severe pain. 02/05/23  Yes Bethann Berkshire, MD  ACCU-CHEK GUIDE test strip USE TO TEST BLOOD SUGAR 4 TIMES DAILY. 11/25/20   Dani Gobble, NP  albuterol (PROVENTIL HFA;VENTOLIN HFA) 108 (90 Base) MCG/ACT inhaler Inhale 2 puffs into the lungs every 6 (six) hours as needed for wheezing or shortness of breath. 07/07/18   Money, Gerlene Burdock, FNP  amLODipine (NORVASC) 10 MG tablet Take 1 tablet (10 mg total) by mouth daily. For high blood pressure 07/07/18   Money, Trufant B, FNP  BD PEN NEEDLE NANO U/F 32G X 4 MM MISC USE AS DIRECTED TWICE DAILY 07/14/21   Nida, Denman George, MD  benzonatate (TESSALON) 100 MG capsule Take by mouth 2 (two) times daily as needed for cough.    [provider]  blood glucose meter kit and supplies KIT 1 each by Does not apply route 4 (four) times daily. Dispense based on patient and insurance preference. Use up to four times daily as directed. (FOR ICD-9 250.00,  250.01). 03/06/20   Roma Kayser, MD  busPIRone (BUSPAR) 5 MG tablet Take 5 mg by mouth 3 (three) times daily. 05/31/22   [provider]  Cholecalciferol (VITAMIN D3) 125 MCG (5000 UT) CAPS Take 1 capsule (5,000 Units total) by mouth daily. 02/14/19   Roma Kayser, MD  clopidogrel (PLAVIX) 75 MG tablet Take 1 tablet (75 mg total) by mouth daily. 09/22/21   Glade Lloyd, MD  DULoxetine (CYMBALTA) 60 MG capsule Take 1 capsule (60 mg total) by mouth 2 (two) times daily. Patient not taking: Reported on 02/01/2023 03/11/21   Myrlene Broker, MD  ezetimibe (ZETIA) 10 MG tablet Take 1 tablet (10 mg total) by mouth daily. 07/20/22 07/15/23  Mallipeddi, Vishnu P, MD  fluticasone (FLONASE) 50 MCG/ACT nasal spray Place 1 spray into both nostrils daily. 02/11/22   [provider]  furosemide (LASIX) 20 MG tablet Take 1 tablet (20 mg total) by mouth daily. 11/11/21   Strader, Lennart Pall, PA-C  glipiZIDE (GLUCOTROL) 10 MG tablet Take 1 tablet (10 mg total) by mouth 2 (two) times daily before a meal. Patient not taking: Reported on 10/18/2022 07/07/18   Money, Gerlene Burdock, FNP  hydrOXYzine (ATARAX) 50 MG tablet Take 50 mg by mouth 2 (two) times daily.    [provider]  insulin isophane & regular human KwikPen (NOVOLIN 70/30 KWIKPEN) (70-30) 100 UNIT/ML KwikPen Inject 25 Units into the skin 2 (two) times  daily before lunch and supper. 09/21/21   Glade Lloyd, MD  JANUVIA 25 MG tablet Take 50 mg by mouth daily. 01/26/22   [provider]  levothyroxine (SYNTHROID, LEVOTHROID) 50 MCG tablet Take 1 tablet (50 mcg total) by mouth daily before breakfast. For hypothyroidism 07/07/18   Money, Gerlene Burdock, FNP  methocarbamol (ROBAXIN) 500 MG tablet Take 500 mg by mouth every 8 (eight) hours as needed for muscle spasms. 11/08/22   [provider]  metoprolol succinate (TOPROL-XL) 50 MG 24 hr tablet Take 50 mg by mouth daily. Take with or immediately following a meal.     [provider]  montelukast (SINGULAIR) 10 MG tablet Take 10 mg by mouth daily. 05/13/22   [provider]  nitroGLYCERIN (NITROSTAT) 0.4 MG SL tablet Place 1 tablet (0.4 mg total) under the tongue every 5 (five) minutes as needed for chest pain. 12/25/20   Strader, Lennart Pall, PA-C  NOVOLOG FLEXPEN 100 UNIT/ML FlexPen Inject 7 Units into the skin 3 (three) times daily with meals. 01/31/23   [provider]  ondansetron (ZOFRAN-ODT) 4 MG disintegrating tablet 4mg  ODT q4 hours prn nausea/vomit 10/18/22   Bethann Berkshire, MD  phenazopyridine (PYRIDIUM) 100 MG tablet Take 100 mg by mouth 3 (three) times daily as needed for pain.    [provider]  potassium chloride (KLOR-CON) 10 MEQ tablet Take 10 mEq by mouth daily. 05/13/22   [provider]  predniSONE (DELTASONE) 20 MG tablet 2 tabs po daily x 3 days Patient not taking: Reported on 02/01/2023 10/18/22   Bethann Berkshire, MD  risperiDONE (RISPERDAL) 1 MG tablet Take 0.5 mg by mouth at bedtime. 05/31/22   [provider]  rizatriptan (MAXALT) 5 MG tablet Take 5 mg by mouth daily as needed for migraine. 04/03/21   [provider]  rosuvastatin (CRESTOR) 40 MG tablet Take 1 tablet (40 mg total) by mouth daily. 11/20/20   Dani Gobble, NP  traMADol (ULTRAM) 50 MG tablet Take 25 mg by mouth 3 (three) times daily as needed.    [provider]  traZODone (DESYREL) 50 MG tablet Take 100 mg by mouth at bedtime.    [provider]  Vitamin D, Ergocalciferol, (DRISDOL) 1.25 MG (50000 UNIT) CAPS capsule Take 1 capsule (50,000 Units total) by mouth every 7 (seven) days. 11/20/20   Dani Gobble, NP      Allergies    Amoxicillin, Doxycycline, Levofloxacin, and Propoxyphene n-acetaminophen    Review of Systems   Review of Systems  Constitutional:  Negative for appetite change and fatigue.  HENT:  Negative for congestion, ear discharge and sinus pressure.   Eyes:  Negative for  discharge.  Respiratory:  Negative for cough.   Cardiovascular:  Negative for chest pain.  Gastrointestinal:  Negative for abdominal pain and diarrhea.  Genitourinary:  Negative for frequency and hematuria.  Musculoskeletal:  Positive for back pain.  Skin:  Negative for rash.  Neurological:  Negative for seizures and headaches.  Psychiatric/Behavioral:  Negative for hallucinations.     Physical Exam Updated Vital Signs BP (!) 151/70   Pulse (!) 59   Temp 98.3 F (36.8 C) (Oral)   Resp 13   Ht 5\' 5"  (1.651 m)   Wt (!) 137.9 kg   SpO2 96%   BMI 50.59 kg/m  Physical Exam Vitals and nursing note reviewed.  Constitutional:      Appearance: She is well-developed.  HENT:     Head: Normocephalic.  Nose: Nose normal.  Eyes:     General: No scleral icterus.    Conjunctiva/sclera: Conjunctivae normal.  Neck:     Thyroid: No thyromegaly.  Cardiovascular:     Rate and Rhythm: Normal rate and regular rhythm.     Heart sounds: No murmur heard.    No friction rub. No gallop.  Pulmonary:     Breath sounds: No stridor. No wheezing or rales.  Chest:     Chest wall: No tenderness.  Abdominal:     General: There is no distension.     Tenderness: There is no abdominal tenderness. There is no rebound.  Musculoskeletal:     Cervical back: Neck supple.     Comments: Mild tender to lumbar muscles with positive straight leg raise on the left.  Lymphadenopathy:     Cervical: No cervical adenopathy.  Skin:    Findings: No erythema or rash.  Neurological:     Mental Status: She is alert and oriented to person, place, and time.     Motor: No abnormal muscle tone.     Coordination: Coordination normal.  Psychiatric:        Behavior: Behavior normal.     ED Results / Procedures / Treatments   Labs (all labs ordered are listed, but only abnormal results are displayed) Labs Reviewed  CBC WITH DIFFERENTIAL/PLATELET - Abnormal; Notable for the following components:      Result Value    Hemoglobin 10.3 (*)    HCT 31.7 (*)    All other components within normal limits  COMPREHENSIVE METABOLIC PANEL - Abnormal; Notable for the following components:   Sodium 133 (*)    Glucose, Bld 347 (*)    BUN 26 (*)    Creatinine, Ser 1.18 (*)    Calcium 8.5 (*)    Albumin 3.2 (*)    GFR, Estimated 52 (*)    All other components within normal limits  CBG MONITORING, ED - Abnormal; Notable for the following components:   Glucose-Capillary 353 (*)    All other components within normal limits  LIPASE, BLOOD    EKG None  Radiology No results found.  Procedures Procedures    Medications Ordered in ED Medications  HYDROmorphone (DILAUDID) injection 1 mg (has no administration in time range)  sodium chloride 0.9 % bolus 500 mL (500 mLs Intravenous New Bag/Given 02/05/23 2059)    ED Course/ Medical Decision Making/ A&P                             Medical Decision Making Amount and/or Complexity of Data Reviewed Labs: ordered.  Risk Prescription drug management.  This patient presents to the ED for concern of neck pain, this involves an extensive number of treatment options, and is a complaint that carries with it a high risk of complications and morbidity.  The differential diagnosis includes sciatica from muscle pain.  Osteoarthritis   Co morbidities that complicate the patient evaluation  Diabetes coronary artery disease and back pain chronic   Additional history obtained:  Additional history obtained from patient External records from outside source obtained and reviewed including hospital records   Lab Tests:  I Ordered, and personally interpreted labs.  The pertinent results include: Glucose 347   Imaging Studies ordered: No imaging  Cardiac Monitoring: / EKG:  The patient was maintained on a cardiac monitor.  I personally viewed and interpreted the cardiac monitored which showed an underlying rhythm of:  Normal   Consultations Obtained:  No  consultant Problem List / ED Course / Critical interventions / Medication management  Diabetes and back pain with sciatica I ordered medication including Percocet for pain Reevaluation of the patient after these medicines showed that the patient improved I have reviewed the patients home medicines and have made adjustments as needed   Social Determinants of Health:  Lives in a nursing home   Test / Admission - Considered:  None  Patient with sciatica pain.  She is placed on Percocets and will follow-up with her family doctor and orthopedics        Final Clinical Impression(s) / ED Diagnoses Final diagnoses:  Sciatica of left side    Rx / DC Orders ED Discharge Orders          Ordered    oxyCODONE-acetaminophen (PERCOCET/ROXICET) 5-325 MG tablet  Every 6 hours PRN        02/05/23 2208              Bethann Berkshire, MD 02/06/23 1623

## 2023-02-05 NOTE — Discharge Instructions (Addendum)
Follow-up with your family doctor and you have also been referred to an orthopedic doctor for your sciatica

## 2023-02-05 NOTE — ED Notes (Signed)
ED Provider at bedside. 

## 2023-04-17 ENCOUNTER — Encounter (HOSPITAL_BASED_OUTPATIENT_CLINIC_OR_DEPARTMENT_OTHER): Payer: Self-pay | Admitting: Urology

## 2023-04-17 ENCOUNTER — Emergency Department (HOSPITAL_BASED_OUTPATIENT_CLINIC_OR_DEPARTMENT_OTHER)
Admission: EM | Admit: 2023-04-17 | Discharge: 2023-04-18 | Disposition: A | Discharge status: Home / Self Care | Payer: Medicare HMO | Attending: Emergency Medicine | Admitting: Emergency Medicine

## 2023-04-17 DIAGNOSIS — F319 Bipolar disorder, unspecified: Secondary | ICD-10-CM | POA: Diagnosis not present

## 2023-04-17 DIAGNOSIS — I2510 Atherosclerotic heart disease of native coronary artery without angina pectoris: Secondary | ICD-10-CM | POA: Diagnosis not present

## 2023-04-17 DIAGNOSIS — F341 Dysthymic disorder: Secondary | ICD-10-CM | POA: Insufficient documentation

## 2023-04-17 DIAGNOSIS — R443 Hallucinations, unspecified: Secondary | ICD-10-CM

## 2023-04-17 DIAGNOSIS — J45909 Unspecified asthma, uncomplicated: Secondary | ICD-10-CM | POA: Diagnosis not present

## 2023-04-17 DIAGNOSIS — R441 Visual hallucinations: Secondary | ICD-10-CM | POA: Insufficient documentation

## 2023-04-17 DIAGNOSIS — R440 Auditory hallucinations: Secondary | ICD-10-CM | POA: Diagnosis present

## 2023-04-17 DIAGNOSIS — E119 Type 2 diabetes mellitus without complications: Secondary | ICD-10-CM | POA: Insufficient documentation

## 2023-04-17 DIAGNOSIS — I10 Essential (primary) hypertension: Secondary | ICD-10-CM | POA: Diagnosis not present

## 2023-04-17 DIAGNOSIS — F419 Anxiety disorder, unspecified: Secondary | ICD-10-CM | POA: Diagnosis not present

## 2023-04-17 LAB — CBC WITH DIFFERENTIAL/PLATELET
Abs Immature Granulocytes: 0.03 10*3/uL (ref 0.00–0.07)
Basophils Absolute: 0.1 10*3/uL (ref 0.0–0.1)
Basophils Relative: 1 %
Eosinophils Absolute: 0.4 10*3/uL (ref 0.0–0.5)
Eosinophils Relative: 6 %
HCT: 34.9 % — ABNORMAL LOW (ref 36.0–46.0)
Hemoglobin: 11.4 g/dL — ABNORMAL LOW (ref 12.0–15.0)
Immature Granulocytes: 0 %
Lymphocytes Relative: 17 %
Lymphs Abs: 1.1 10*3/uL (ref 0.7–4.0)
MCH: 26.5 pg (ref 26.0–34.0)
MCHC: 32.7 g/dL (ref 30.0–36.0)
MCV: 81.2 fL (ref 80.0–100.0)
Monocytes Absolute: 0.4 10*3/uL (ref 0.1–1.0)
Monocytes Relative: 6 %
Neutro Abs: 4.9 10*3/uL (ref 1.7–7.7)
Neutrophils Relative %: 70 %
Platelets: 233 10*3/uL (ref 150–400)
RBC: 4.3 MIL/uL (ref 3.87–5.11)
RDW: 15.2 % (ref 11.5–15.5)
WBC: 6.9 10*3/uL (ref 4.0–10.5)
nRBC: 0 % (ref 0.0–0.2)

## 2023-04-17 LAB — COMPREHENSIVE METABOLIC PANEL
ALT: 40 U/L (ref 0–44)
AST: 41 U/L (ref 15–41)
Albumin: 3.6 g/dL (ref 3.5–5.0)
Alkaline Phosphatase: 73 U/L (ref 38–126)
Anion gap: 11 (ref 5–15)
BUN: 28 mg/dL — ABNORMAL HIGH (ref 8–23)
CO2: 23 mmol/L (ref 22–32)
Calcium: 8.7 mg/dL — ABNORMAL LOW (ref 8.9–10.3)
Chloride: 102 mmol/L (ref 98–111)
Creatinine, Ser: 1.53 mg/dL — ABNORMAL HIGH (ref 0.44–1.00)
GFR, Estimated: 38 mL/min — ABNORMAL LOW (ref >60)
Glucose, Bld: 303 mg/dL — ABNORMAL HIGH (ref 70–99)
Potassium: 4 mmol/L (ref 3.5–5.1)
Sodium: 136 mmol/L (ref 135–145)
Total Bilirubin: 0.7 mg/dL (ref 0.3–1.2)
Total Protein: 7.9 g/dL (ref 6.5–8.1)

## 2023-04-17 LAB — ETHANOL: Alcohol, Ethyl (B): <10 mg/dL (ref <10)

## 2023-04-17 LAB — RAPID URINE DRUG SCREEN, HOSP PERFORMED
Amphetamines: NOT DETECTED
Barbiturates: NOT DETECTED
Benzodiazepines: NOT DETECTED
Cocaine: NOT DETECTED
Opiates: POSITIVE — AB
Tetrahydrocannabinol: NOT DETECTED

## 2023-04-17 LAB — URINALYSIS, MICROSCOPIC (REFLEX)

## 2023-04-17 LAB — URINALYSIS, ROUTINE W REFLEX MICROSCOPIC
Bilirubin Urine: NEGATIVE
Glucose, UA: 100 mg/dL — AB
Ketones, ur: NEGATIVE mg/dL
Leukocytes,Ua: NEGATIVE
Nitrite: NEGATIVE
Protein, ur: 100 mg/dL — AB
Specific Gravity, Urine: >=1.03 (ref 1.005–1.030)
pH: 5 (ref 5.0–8.0)

## 2023-04-17 LAB — SALICYLATE LEVEL: Salicylate Lvl: <7 mg/dL — ABNORMAL LOW (ref 7.0–30.0)

## 2023-04-17 LAB — CBG MONITORING, ED: Glucose-Capillary: 197 mg/dL — ABNORMAL HIGH (ref 70–99)

## 2023-04-17 LAB — ACETAMINOPHEN LEVEL: Acetaminophen (Tylenol), Serum: 13 ug/mL (ref 10–30)

## 2023-04-17 MED ORDER — POTASSIUM CHLORIDE ER 10 MEQ PO TBCR: 10.0000 meq | EXTENDED_RELEASE_TABLET | Freq: Every day | ORAL | Status: DC

## 2023-04-17 MED ORDER — BASAGLAR KWIKPEN 100 UNIT/ML ~~LOC~~ SOPN: 30.0000 [IU] | PEN_INJECTOR | Freq: Two times a day (BID) | SUBCUTANEOUS | Status: DC

## 2023-04-17 MED ORDER — ALBUTEROL SULFATE HFA 108 (90 BASE) MCG/ACT IN AERS
1.0000 | INHALATION_SPRAY | RESPIRATORY_TRACT | Status: DC | PRN
  Administered 2023-04-18: 2 via RESPIRATORY_TRACT
  Filled 2023-04-17: qty 6.7

## 2023-04-17 MED ORDER — FLUTICASONE PROPIONATE 50 MCG/ACT NA SUSP
1.0000 | Freq: Every day | NASAL | Status: DC
  Administered 2023-04-17 – 2023-04-18 (×2): 1 via NASAL
  Filled 2023-04-17 (×2): qty 16

## 2023-04-17 MED ORDER — AMLODIPINE BESYLATE 5 MG PO TABS
5.0000 mg | ORAL_TABLET | Freq: Every day | ORAL | Status: DC
  Administered 2023-04-17 – 2023-04-18 (×2): 5 mg via ORAL
  Filled 2023-04-17 (×2): qty 1

## 2023-04-17 MED ORDER — VITAMIN D 25 MCG (1000 UNIT) PO TABS
5000.0000 [IU] | ORAL_TABLET | Freq: Every day | ORAL | Status: DC
  Administered 2023-04-18: 5000 [IU] via ORAL
  Filled 2023-04-17: qty 5

## 2023-04-17 MED ORDER — EZETIMIBE 10 MG PO TABS: 10.0000 mg | ORAL_TABLET | Freq: Every day | ORAL | Status: DC

## 2023-04-17 MED ORDER — INSULIN ISOPHANE & REGULAR (HUMAN 70-30)100 UNIT/ML KWIKPEN: 25.0000 [IU] | PEN_INJECTOR | Freq: Two times a day (BID) | SUBCUTANEOUS | Status: DC

## 2023-04-17 MED ORDER — HYDROXYZINE HCL 25 MG PO TABS
25.0000 mg | ORAL_TABLET | Freq: Two times a day (BID) | ORAL | Status: DC
  Administered 2023-04-17 – 2023-04-18 (×2): 25 mg via ORAL
  Filled 2023-04-17 (×2): qty 1

## 2023-04-17 MED ORDER — FUROSEMIDE 20 MG PO TABS
20.0000 mg | ORAL_TABLET | Freq: Every day | ORAL | Status: DC
  Administered 2023-04-17 – 2023-04-18 (×2): 20 mg via ORAL
  Filled 2023-04-17 (×2): qty 1

## 2023-04-17 MED ORDER — BUSPIRONE HCL 5 MG PO TABS: 5.0000 mg | ORAL_TABLET | Freq: Three times a day (TID) | ORAL | Status: DC

## 2023-04-17 MED ORDER — LINAGLIPTIN 5 MG PO TABS
5.0000 mg | ORAL_TABLET | Freq: Every day | ORAL | Status: DC
Start: 2023-04-17 — End: 2023-04-18

## 2023-04-17 MED ORDER — CLOPIDOGREL BISULFATE 75 MG PO TABS
75.0000 mg | ORAL_TABLET | Freq: Every day | ORAL | Status: DC
  Administered 2023-04-17 – 2023-04-18 (×2): 75 mg via ORAL
  Filled 2023-04-17 (×2): qty 1

## 2023-04-17 MED ORDER — METOPROLOL SUCCINATE ER 25 MG PO TB24
50.0000 mg | ORAL_TABLET | Freq: Every day | ORAL | Status: DC
  Administered 2023-04-18: 50 mg via ORAL
  Filled 2023-04-17: qty 2

## 2023-04-17 MED ORDER — INSULIN GLARGINE-YFGN 100 UNIT/ML ~~LOC~~ SOLN
30.0000 [IU] | Freq: Two times a day (BID) | SUBCUTANEOUS | Status: DC
  Administered 2023-04-17 – 2023-04-18 (×2): 30 [IU] via SUBCUTANEOUS
  Filled 2023-04-17 (×2): qty 300

## 2023-04-17 MED ORDER — ROSUVASTATIN CALCIUM 40 MG PO TABS: 40.0000 mg | ORAL_TABLET | Freq: Every day | ORAL | Status: DC

## 2023-04-17 MED ORDER — MONTELUKAST SODIUM 10 MG PO TABS: 10.0000 mg | ORAL_TABLET | Freq: Every day | ORAL | Status: DC

## 2023-04-17 MED ORDER — POTASSIUM CHLORIDE CRYS ER 20 MEQ PO TBCR
10.0000 meq | EXTENDED_RELEASE_TABLET | Freq: Every day | ORAL | Status: DC
  Administered 2023-04-18: 10 meq via ORAL

## 2023-04-17 MED ORDER — LEVOTHYROXINE SODIUM 25 MCG PO TABS: 50.0000 ug | ORAL_TABLET | Freq: Every day | ORAL | Status: DC

## 2023-04-17 MED ORDER — LEVOTHYROXINE SODIUM 25 MCG PO TABS
50.0000 ug | ORAL_TABLET | Freq: Every day | ORAL | Status: DC
  Administered 2023-04-18: 50 ug via ORAL
  Filled 2023-04-17: qty 2

## 2023-04-17 MED ORDER — METOPROLOL SUCCINATE ER 25 MG PO TB24: 50.0000 mg | ORAL_TABLET | Freq: Every day | ORAL | Status: DC

## 2023-04-17 MED ORDER — RISPERIDONE 0.5 MG PO TABS
0.5000 mg | ORAL_TABLET | Freq: Two times a day (BID) | ORAL | Status: DC
  Administered 2023-04-17 – 2023-04-18 (×2): 0.5 mg via ORAL
  Filled 2023-04-17 (×2): qty 1

## 2023-04-17 NOTE — ED Notes (Signed)
Put TTS monitor in room. TTS said they are ready to see pt.

## 2023-04-17 NOTE — ED Triage Notes (Signed)
Pt has h/o bipolar and psychosis States started having visual hallucinations 1 week ago  Stopped taking trazadone States now "seeing devil during day and night in apartment"  Also reports hearing noise like a rat prior to visual hallucination  Denies SI/HI
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
## 2023-04-17 NOTE — ED Provider Notes (Signed)
[facility] Provider Note   CSN: [id number] Arrival date & time: 04/17/23  1409     History  Chief Complaint  Patient presents with   Mental Health Problem   Hallucinations    [name] is a 62 y.o. female.  With a history of hypertension, asthma, diabetes, CAD, hypercholesterolemia, bipolar 1 disorder, anxiety, depression who presents to the ED for evaluation of hallucination.  She states she recently moved from [place] to [place]..  A week ago she developed some visual hallucinations.  She states she sees the devil in her apartment.  The devil is laughing at her.  She also reports auditory hallucinations prior to these visions stating she hears someone cutting paper in her ear.  She has a history of similar presentation due to significant depression approximately 10 years ago.  She denies feeling depressed at this time but states that seeing the devil makes her sad.  She denies suicidal or homicidal ideations.  She reports compliance with her medications.  Daughter thought that the patient's trazodone may be interacting with her hydrocodone so she stopped taking her trazodone 3 days ago.  Symptoms have progressively gotten worse.  She denies any physical complaints.  Specifically denies chest pain, shortness of breath, abdominal pain, fevers, chills, dysuria, frequency, urgency.  She has an appointment with her new psychiatrist on Thursday of this week.   Mental Health Problem Presenting symptoms: hallucinations        Home Medications Prior to Admission medications   Medication Sig Start Date End Date Taking? Authorizing Provider  Insulin Glargine (BASAGLAR KWIKPEN) 100 UNIT/ML 30 units twice per day 03/28/23  Yes [provider]  NOVOLIN R FLEXPEN RELION 100 UNIT/ML FlexPen Inject into the skin. 01/31/23  Yes [provider]  ACCU-CHEK GUIDE test strip USE TO TEST BLOOD SUGAR 4 TIMES DAILY. 11/25/20   Dani Gobble, NP  albuterol (PROVENTIL HFA;VENTOLIN HFA) 108 (90 Base) MCG/ACT inhaler Inhale 2 puffs into the lungs every 6 (six) hours as needed for wheezing or shortness of breath. 07/07/18   Money, Gerlene Burdock, FNP  amLODipine (NORVASC) 10 MG tablet Take 1 tablet (10 mg total) by mouth daily. For high blood pressure 07/07/18   Money, [place] B, FNP  BD PEN NEEDLE NANO U/F 32G X 4 MM MISC USE AS DIRECTED TWICE DAILY 07/14/21   Nida, Denman George, MD  benzonatate (TESSALON) 100 MG capsule Take by mouth 2 (two) times daily as needed for cough.    [provider]  blood glucose meter kit and supplies KIT 1 each by Does not apply route 4 (four) times daily. Dispense based on patient and insurance preference. Use up to four times daily as directed. (FOR ICD-9 250.00, 250.01). 03/06/20   Roma Kayser, MD  busPIRone (BUSPAR) 5 MG tablet Take 5 mg by mouth 3 (three) times daily. 05/31/22   [provider]  Cholecalciferol (VITAMIN D3) 125 MCG (5000 UT) CAPS Take 1 capsule (5,000 Units total) by mouth daily. 02/14/19   Roma Kayser, MD  clopidogrel (PLAVIX) 75 MG tablet Take 1 tablet (75 mg total) by mouth daily. 09/22/21   Glade Lloyd, MD  DULoxetine (CYMBALTA) 60 MG capsule Take 1 capsule (60 mg total) by mouth 2 (two) times daily. Patient not taking: Reported on 02/01/2023 03/11/21   Myrlene Broker, MD  ezetimibe (ZETIA) 10 MG tablet Take 1 tablet (10 mg total) by mouth daily. 07/20/22 07/15/23  Mallipeddi, Walt Disney  P, MD  fluticasone (FLONASE) 50 MCG/ACT nasal spray Place 1 spray into both nostrils daily. 02/11/22   [provider]  furosemide (LASIX) 20 MG tablet Take 1 tablet (20 mg total) by mouth daily. 11/11/21   Strader, Lennart Pall, PA-C  glipiZIDE (GLUCOTROL) 10 MG tablet Take 1 tablet (10 mg total) by mouth 2 (two) times daily before a meal. Patient not taking: Reported on 10/18/2022 07/07/18   Money, Gerlene Burdock, FNP  hydrOXYzine (ATARAX) 50 MG tablet Take 50 mg  by mouth 2 (two) times daily.    [provider]  insulin isophane & regular human KwikPen (NOVOLIN 70/30 KWIKPEN) (70-30) 100 UNIT/ML KwikPen Inject 25 Units into the skin 2 (two) times daily before lunch and supper. 09/21/21   Glade Lloyd, MD  JANUVIA 25 MG tablet Take 100 mg by mouth daily. 01/26/22   [provider]  levothyroxine (SYNTHROID, LEVOTHROID) 50 MCG tablet Take 1 tablet (50 mcg total) by mouth daily before breakfast. For hypothyroidism 07/07/18   Money, Gerlene Burdock, FNP  methocarbamol (ROBAXIN) 500 MG tablet Take 500 mg by mouth every 8 (eight) hours as needed for muscle spasms. 11/08/22   [provider]  metoprolol succinate (TOPROL-XL) 50 MG 24 hr tablet Take 50 mg by mouth daily. Take with or immediately following a meal.    [provider]  montelukast (SINGULAIR) 10 MG tablet Take 10 mg by mouth daily. 05/13/22   [provider]  nitroGLYCERIN (NITROSTAT) 0.4 MG SL tablet Place 1 tablet (0.4 mg total) under the tongue every 5 (five) minutes as needed for chest pain. 12/25/20   Strader, Lennart Pall, PA-C  NOVOLOG FLEXPEN 100 UNIT/ML FlexPen Inject 7 Units into the skin 3 (three) times daily with meals. 01/31/23   [provider]  ondansetron (ZOFRAN-ODT) 4 MG disintegrating tablet 4mg  ODT q4 hours prn nausea/vomit 10/18/22   Bethann Berkshire, MD  oxyCODONE-acetaminophen (PERCOCET/ROXICET) 5-325 MG tablet Take 1 tablet by mouth every 6 (six) hours as needed for severe pain. 02/05/23   Bethann Berkshire, MD  phenazopyridine (PYRIDIUM) 100 MG tablet Take 100 mg by mouth 3 (three) times daily as needed for pain.    [provider]  potassium chloride (KLOR-CON) 10 MEQ tablet Take 10 mEq by mouth daily. 05/13/22   [provider]  predniSONE (DELTASONE) 20 MG tablet 2 tabs po daily x 3 days Patient not taking: Reported on 02/01/2023 10/18/22   Bethann Berkshire, MD  risperiDONE (RISPERDAL) 1 MG tablet Take 0.5 mg by mouth 2 (two) times  daily. 05/31/22   [provider]  rizatriptan (MAXALT) 5 MG tablet Take 5 mg by mouth daily as needed for migraine. 04/03/21   [provider]  rosuvastatin (CRESTOR) 40 MG tablet Take 1 tablet (40 mg total) by mouth daily. 11/20/20   Dani Gobble, NP  traMADol (ULTRAM) 50 MG tablet Take 25 mg by mouth 3 (three) times daily as needed.    [provider]  traZODone (DESYREL) 50 MG tablet Take 100 mg by mouth at bedtime.    [provider]  Vitamin D, Ergocalciferol, (DRISDOL) 1.25 MG (50000 UNIT) CAPS capsule Take 1 capsule (50,000 Units total) by mouth every 7 (seven) days. 11/20/20   Dani Gobble, NP      Allergies    Amoxicillin, Doxycycline, Levofloxacin, and Propoxyphene n-acetaminophen    Review of Systems   Review of Systems  Psychiatric/Behavioral:  Positive for dysphoric mood and hallucinations.   All other systems reviewed and  are negative.   Physical Exam Updated Vital Signs BP (!) 156/73 (BP Location: Left Arm)   Pulse 69   Temp 97.6 F (36.4 C)   Resp 18   Ht 5\' 5"  (1.651 m)   Wt (!) 137.9 kg   SpO2 96%   BMI 50.59 kg/m  Physical Exam Vitals and nursing note reviewed.  Constitutional:      General: She is not in acute distress.    Appearance: She is well-developed. She is obese.     Comments: Resting comfortably in bed  HENT:     Head: Normocephalic and atraumatic.  Eyes:     Conjunctiva/sclera: Conjunctivae normal.  Cardiovascular:     Rate and Rhythm: Normal rate and regular rhythm.     Heart sounds: No murmur heard. Pulmonary:     Effort: Pulmonary effort is normal. No respiratory distress.     Breath sounds: Normal breath sounds. No wheezing, rhonchi or rales.  Abdominal:     Palpations: Abdomen is soft.     Tenderness: There is no abdominal tenderness.  Musculoskeletal:        General: No swelling.     Cervical back: Neck supple.  Skin:    General: Skin is warm and dry.     Capillary Refill: Capillary  refill takes less than 2 seconds.  Neurological:     General: No focal deficit present.     Mental Status: She is alert and oriented to person, place, and time.     Comments:   MENTAL STATUS: AAOx3   LANG/SPEECH: Fluent, intact naming, repetition & comprehension   CRANIAL NERVES:   II: Pupils equal and reactive   III, IV, VI: EOM intact, no gaze preference or deviation, no nystagmus   V: normal sensation of the face   VII: no facial asymmetry   VIII: normal hearing to speech   MOTOR: 5/5 in both upper and lower extremities   SENSORY: Normal to touch in all extremiteis   COORD: Normal finger to nose, heel to shin and shoulder shrug, no tremor, no dysmetria. No pronator drift   Psychiatric:        Mood and Affect: Mood normal.        Behavior: Behavior normal.     Comments: Alert, pleasant, not responding to internal stimuli     ED Results / Procedures / Treatments   Labs (all labs ordered are listed, but only abnormal results are displayed) Labs Reviewed  COMPREHENSIVE METABOLIC PANEL - Abnormal; Notable for the following components:      Result Value   Glucose, Bld 303 (*)    BUN 28 (*)    Creatinine, Ser 1.53 (*)    Calcium 8.7 (*)    GFR, Estimated 38 (*)    All other components within normal limits  RAPID URINE DRUG SCREEN, HOSP PERFORMED - Abnormal; Notable for the following components:   Opiates POSITIVE (*)    All other components within normal limits  CBC WITH DIFFERENTIAL/PLATELET - Abnormal; Notable for the following components:   Hemoglobin 11.4 (*)    HCT 34.9 (*)    All other components within normal limits  SALICYLATE LEVEL - Abnormal; Notable for the following components:   Salicylate Lvl <7.0 (*)    All other components within normal limits  URINALYSIS, ROUTINE W REFLEX MICROSCOPIC - Abnormal; Notable for the following components:   Glucose, UA 100 (*)    Hgb urine dipstick SMALL (*)    Protein, ur 100 (*)  All other components within normal limits   URINALYSIS, MICROSCOPIC (REFLEX) - Abnormal; Notable for the following components:   Bacteria, UA FEW (*)    All other components within normal limits  ETHANOL  ACETAMINOPHEN LEVEL    EKG None  Radiology No results found.  Procedures Procedures    Medications Ordered in ED Medications  amLODipine (NORVASC) tablet 5 mg (has no administration in time range)  busPIRone (BUSPAR) tablet 5 mg (has no administration in time range)  cholecalciferol (VITAMIN D3) 25 MCG (1000 UNIT) tablet 5,000 Units (has no administration in time range)  clopidogrel (PLAVIX) tablet 75 mg (has no administration in time range)  ezetimibe (ZETIA) tablet 10 mg (has no administration in time range)  fluticasone (FLONASE) 50 MCG/ACT nasal spray 1 spray (has no administration in time range)  furosemide (LASIX) tablet 20 mg (has no administration in time range)  hydrOXYzine (ATARAX) tablet 25 mg (has no administration in time range)  Basaglar KwikPen KwikPen 30 Units (has no administration in time range)  insulin isophane & regular human KwikPen (HUMULIN 70/30 MIX) (70-30) 100 UNIT/ML KwikPen 25 Units (has no administration in time range)  linagliptin (TRADJENTA) tablet 5 mg (has no administration in time range)  levothyroxine (SYNTHROID) tablet 50 mcg (has no administration in time range)  metoprolol succinate (TOPROL-XL) 24 hr tablet 50 mg (has no administration in time range)  montelukast (SINGULAIR) tablet 10 mg (has no administration in time range)  risperiDONE (RISPERDAL) tablet 0.5 mg (has no administration in time range)  rosuvastatin (CRESTOR) tablet 40 mg (has no administration in time range)  potassium chloride SA (KLOR-CON M) CR tablet 10 mEq (has no administration in time range)    ED Course/ Medical Decision Making/ A&P                             Medical Decision Making Amount and/or Complexity of Data Reviewed Labs: ordered.  This patient presents to the ED for concern of auditory and  visual hallucinations, this involves an extensive number of treatment options, and is a complaint that carries with it a high risk of complications and morbidity.  The differential diagnosis includes psychosis, new onset schizophrenia, decompensated bipolar disorder  Co morbidities that complicate the patient evaluation   hypertension, asthma, diabetes, CAD, hypercholesterolemia, bipolar 1 disorder, anxiety, depression  My initial workup includes medical clearance labs, EKG  Additional history obtained from: Nursing notes from this visit. Family daughter is at bedside and provides a portion of the history  I ordered, reviewed and interpreted labs which include: CMP, CBC, CMP, ethanol, salicylate, UDS, urinalysis.  UDS positive for opiates.  Patient takes Norco chronically.  CMP with a hyperglycemia of 303.  Her normal is between 305 100.  Creatinine 1.5.  BUN 28 which is near her baseline.  CBC without leukocytosis or significant anemia.  Urinalysis without evidence of infection.  Afebrile, hemodynamically stable.  62 year old female presenting to the ED for evaluation of auditory and visual hallucinations.  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  She reports at that time she was completely disoriented.  She does not believe she is having a stroke at this time. She has a benign, nonfocal examination.  She does not have any history of schizophrenia.  She tried discontinuing her trazodone 3 days ago for fear of medication reaction with her Norco.  Her hallucinations  have progressively gotten worse despite this.  Lab workup was reassuring today.  They were concerned for UTI, however urinalysis is without evidence of infection.  No leukocytosis or fever to suggest systemic infection as the cause of his symptoms.  This may be a reaction to moving to a new environment as she recently moved into a new  apartment.  She is not currently have a psychiatrist for scheduled for an appointment to establish care on Thursday of this week.  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  Her blood sugar typically runs between 200 and 500. Believe patient would benefit from TTS consult due to new onset persistent and worsening hallucinations. Patient is in agreement with this plan. She is stable at this time for TTS consult.  1830-TTS evaluated patient and recommends admission.  Patient initially did not want admission, however on reevaluation she states she is agreeable to this. Social work will determine placement.  Note: Portions of this report may have been transcribed using voice recognition software. Every effort was made to ensure accuracy; however, inadvertent computerized transcription errors may still be present.        Final Clinical Impression(s) / ED Diagnoses Final diagnoses:  Hallucinations    Rx / DC Orders ED Discharge Orders     None         Michelle Piper, Cordelia Poche 04/17/23 1911    Virgina Norfolk, DO 04/20/23 1941

## 2023-04-18 ENCOUNTER — Encounter (HOSPITAL_BASED_OUTPATIENT_CLINIC_OR_DEPARTMENT_OTHER): Payer: Self-pay | Admitting: Registered Nurse

## 2023-04-18 DIAGNOSIS — R443 Hallucinations, unspecified: Secondary | ICD-10-CM | POA: Diagnosis present

## 2023-04-18 LAB — CBG MONITORING, ED: Glucose-Capillary: 282 mg/dL — ABNORMAL HIGH (ref 70–99)

## 2023-04-18 MED ORDER — RISPERIDONE 0.5 MG PO TABS: ORAL_TABLET | ORAL | 0 refills | Status: AC

## 2023-04-18 NOTE — ED Provider Notes (Signed)
  Physical Exam  BP (!) 172/83 (BP Location: Right Arm)   Pulse 73   Temp 98.2 F (36.8 C) (Oral)   Resp 18   Ht 5\' 5"  (1.651 m)   Wt (!) 137.9 kg   SpO2 99%   BMI 50.59 kg/m   Physical Exam Vitals and nursing note reviewed.  Constitutional:      General: She is not in acute distress.    Appearance: She is well-developed.  HENT:     Head: Normocephalic and atraumatic.  Eyes:     Conjunctiva/sclera: Conjunctivae normal.  Cardiovascular:     Rate and Rhythm: Normal rate and regular rhythm.     Heart sounds: No murmur heard. Pulmonary:     Effort: Pulmonary effort is normal. No respiratory distress.     Breath sounds: Normal breath sounds.  Abdominal:     Palpations: Abdomen is soft.     Tenderness: There is no abdominal tenderness.  Musculoskeletal:        General: No swelling.     Cervical back: Neck supple.  Skin:    General: Skin is warm and dry.     Capillary Refill: Capillary refill takes less than 2 seconds.  Neurological:     General: No focal deficit present.     Mental Status: She is alert and oriented to person, place, and time. Mental status is at baseline.  Psychiatric:        Mood and Affect: Mood normal.        Behavior: Behavior normal.        Thought Content: Thought content normal.        Judgment: Judgment normal.     Procedures  Procedures  ED Course / MDM   Clinical Course as of 04/18/23 1116  Mon Apr 18, 2023  0651 At 24 hours can tx to [facility] [JD]    Clinical Course User Index [JD] Laurence Spates, MD   Medical Decision Making Risk Prescription drug management.   I was notified by [place], NP with psychiatry.  They feel patient cleared for discharge home.  They scheduled follow-up for this week.  They recommended prescribing Risperdal 0.5 mg in the morning and 1 mg in the evening.  Prescription was given.  Discussed this with the patient and the daughter.  She is awake, alert, no signs of hallucinations, SI, HI.  She is not responding  to internal stimuli.  Both her daughter comfortable plan for discharge home.  Discussed plan including follow-up, return precautions.       Laurence Spates, MD 04/18/23 [phone]

## 2023-04-18 NOTE — Consult Note (Cosign Needed Addendum)
Telepsych Consultation   Reason for Consult:  Psychosis Referring Physician:  Mora Bellman  Location of Patient: [facility] Location of Provider: Other: [facility]  Patient Identification: [name] MRN:  [id number] Principal Diagnosis: Hallucinations Diagnosis:  Principal Problem:   Hallucinations   Total Time spent with patient: 45 minutes  Subjective:  [name] 62 year old female present to [facility] accompanied by her daughter Katy Brickell [phone]) with complaints of auditory/visual hallucination on/off for last week but worsened within the last 3 days prior to ED admission after stopping Trazodone.   HPI:  [name] seen virtually via tele psych by this provider, chart reviewed, and consulted with Dr. Nelly Rout on 04/18/23.  On evaluation ROMAINE MACIOLEK reports she brought to the hospital by her daughter because of auditory/visual hallucinations. Patient reports "This morning I saw a black lady sitting behind the door and when the nurse came in the room she disappeared in the wall. Yesterday I saw the devil.  The only place he seems to attack me is in my apartment in my room."  Patient states she is living in a new apartment moving from an assisted living facility.  She states, "Since it was only happening at night that it was my Trazodone, but it got worse after stopping."  Patient states she has had periods of hallucinations on/off.  Patient denies suicidal/self-harm/homicidal ideation, and paranoia.  She states that she just wants the hallucinations to stop.  Patient states she currently lives alone but daughter manages medication.  Patient gave permission to speak to daughter Emelda Brothers [phone]. During evaluation AMARIANNA ABPLANALP is laying on her side in bed facing camera.  There is no noted distress.  She is alert/oriented x 4, calm, cooperative, attentive, and responses were relevant and appropriate to assessment questions.   She spoke in a clear tone at moderate volume, and normal pace, with good eye contact.   She denies suicidal/self-harm/homicidal ideation, and paranoia.  She continues to endorse auditory/visual hallucinations that occur on/off but mainly in her bedroom at home at night.  Objectively there is no evidence of psychosis/mania or delusional thinking other than patients endorsement.  She conversed coherently, with goal directed thoughts, no distractibility, or pre-occupation.  Collateral Information:  Spoke to patient's daughter Emelda Brothers.  Daughter states that her mother was living in an assisted living facility and there had been multiple medication changes along with "her taking oxycodone and muscle relaxer that kept her zonked out and I didn't think that was good for her.  She can care for herself as far as bathing, dressing, cooking.  I just handle her medicine.  She has a medical alert and I have a camera in the apartment where I can peek in and check on her through out the day and I think that is better for her. I had called the doctors at the facility several times and they never got back with me and that is one of the main reasons I had her moved out."  Reports she felt the hallucinations may be related to the Trazodone since mainly occurring at night but wasn't sure but after stopping the hallucinations got worse.  Daughter also states that she sometimes think her mother exaggerate thing "because she wants me there to take care of her 24/7 but I have to keep health boundaries in place.  Daughter states mother has an appointment at [facility] with Dr. Effie Shy this coming Thursday for medication management.  Discussed with patient and daughter increasing Risperdal to 0.5 mg in morning and 1 mg at bedtime.  Also informed to discuss if helped or didn't help when she went to the doctor on Thursday.  Understanding voiced by both.     Past Psychiatric History: [name] has an active diagnosis  of major depression/dysthymia and not had a PHQ-9 completed in the past 4 months. Please click the "PHQ-9" link to complete the required documentation.  (Depression, bipolar disorder, anxiety)   Risk to Self:  Denies Risk to Others:  Denies Prior Inpatient Therapy:  Yes Prior Outpatient Therapy:  Yes  Past Medical History:  Past Medical History:  Diagnosis Date   Asthma    Back pain    CAD (coronary artery disease)    a. 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   Thyroid disease     Past Surgical History:  Procedure Laterality Date   TUBAL LIGATION     Bilateral   Family History:  Family History  Problem Relation Age of Onset   Heart attack Maternal Uncle    Depression Maternal Uncle    Depression Mother    Alcohol abuse Maternal Grandfather    Alcohol abuse Maternal Uncle    Depression Maternal Grandmother    Family Psychiatric  History: See above Social History:  Social History   Substance and Sexual Activity  Alcohol Use No   Alcohol/week: 0.0 standard drinks of alcohol     Social History   Substance and Sexual Activity  Drug Use No   Comment: 05/13/2016 per pt no    Social History   Socioeconomic History   Marital status: Widowed    Spouse name: Not on file   Number of children: Not on file   Years of education: Not on file   Highest education level: Not on file  Occupational History   Occupation: Disabled    Employer: UNEMPLOYED  Tobacco Use   Smoking status: Never   Smokeless tobacco: Never  Vaping Use   Vaping status: Never Used  Substance and Sexual Activity   Alcohol use: No    Alcohol/week: 0.0 standard drinks of  alcohol   Drug use: No    Comment: 05/13/2016 per pt no   Sexual activity: Yes    Birth control/protection: Surgical  Other Topics Concern   Not on file  Social History Narrative   Married   No regular exercise   Social Determinants of Health   Financial Resource Strain: Not on file  Food Insecurity: Low Risk  (11/29/2022)   [facility]   Food vital sign    Within the past 12 months, you worried that your food would run out before you got money to buy more: Never true    Within the past 12 months, the food you bought just didn't last and you didn't have money to get more. : Never true  Transportation Needs: Unmet Transportation Needs (11/29/2022)   [facility]   Transportation    In the past 12 months, has lack of reliable transportation kept you  from medical appointments, meetings, work or from getting things needed for daily living? : Yes  Physical Activity: Not on file  Stress: Not on file  Social Connections: Not on file   Additional Social History:    Allergies:   Allergies  Allergen Reactions   Amoxicillin Swelling and Other (See Comments)    Oral swelling    Doxycycline Nausea And Vomiting   Levofloxacin Other (See Comments)    headache   Propoxyphene N-Acetaminophen Nausea And Vomiting    Labs:  Results for orders placed or performed during the hospital encounter of 04/17/23 (from the past 48 hour(s))  Comprehensive metabolic panel     Status: Abnormal   Collection Time: 04/17/23  2:54 PM  Result Value Ref Range   Sodium 136 135 - 145 mmol/L   Potassium 4.0 3.5 - 5.1 mmol/L   Chloride 102 98 - 111 mmol/L   CO2 23 22 - 32 mmol/L   Glucose, Bld 303 (H) 70 - 99 mg/dL    Comment: Glucose reference range applies only to samples taken after fasting for at least 8 hours.   BUN 28 (H) 8 - 23 mg/dL   Creatinine, Ser 1.02 (H) 0.44 - 1.00 mg/dL   Calcium 8.7 (L) 8.9 - 10.3 mg/dL   Total Protein 7.9 6.5 - 8.1  g/dL   Albumin 3.6 3.5 - 5.0 g/dL   AST 41 15 - 41 U/L   ALT 40 0 - 44 U/L   Alkaline Phosphatase 73 38 - 126 U/L   Total Bilirubin 0.7 0.3 - 1.2 mg/dL   GFR, Estimated 38 (L) >60 mL/min    Comment: (NOTE) Calculated using the CKD-EPI Creatinine Equation (2021)    Anion gap 11 5 - 15    Comment: Performed at [facility], [address] [place], [place] [address]  Ethanol     Status: None   Collection Time: 04/17/23  2:54 PM  Result Value Ref Range   Alcohol, Ethyl (B) <10 <10 mg/dL    Comment: (NOTE) Lowest detectable limit for serum alcohol is 10 mg/dL.  For medical purposes only. Performed at [facility], [address] [place], [place] [address]   Urine rapid drug screen (hosp performed)     Status: Abnormal   Collection Time: 04/17/23  2:54 PM  Result Value Ref Range   Opiates POSITIVE (A) NONE DETECTED   Cocaine NONE DETECTED NONE DETECTED   Benzodiazepines NONE DETECTED NONE DETECTED   Amphetamines NONE DETECTED NONE DETECTED   Tetrahydrocannabinol NONE DETECTED NONE DETECTED   Barbiturates NONE DETECTED NONE DETECTED    Comment: (NOTE) DRUG SCREEN FOR MEDICAL PURPOSES ONLY.  IF CONFIRMATION IS NEEDED FOR ANY PURPOSE, NOTIFY LAB WITHIN 5 DAYS.  LOWEST DETECTABLE LIMITS FOR URINE DRUG SCREEN Drug Class                     Cutoff (ng/mL) Amphetamine and metabolites    1000 Barbiturate and metabolites    200 Benzodiazepine                 200 Opiates and metabolites        300 Cocaine and metabolites        300 THC                            50 Performed at [facility], [address] [place], [place]  [address]   CBC with Diff     Status: Abnormal   Collection Time: 04/17/23  2:54 PM  Result Value Ref Range   WBC 6.9 4.0 - 10.5 K/uL   RBC 4.30 3.87 - 5.11 MIL/uL   Hemoglobin 11.4 (L) 12.0 - 15.0 g/dL   HCT 95.6 (L) 21.3 - 08.6 %   MCV 81.2 80.0 - 100.0 fL   MCH 26.5 26.0 - 34.0 pg   MCHC 32.7 30.0 - 36.0 g/dL   RDW  57.8 46.9 - 62.9 %   Platelets 233 150 - 400 K/uL   nRBC 0.0 0.0 - 0.2 %   Neutrophils Relative % 70 %   Neutro Abs 4.9 1.7 - 7.7 K/uL   Lymphocytes Relative 17 %   Lymphs Abs 1.1 0.7 - 4.0 K/uL   Monocytes Relative 6 %   Monocytes Absolute 0.4 0.1 - 1.0 K/uL   Eosinophils Relative 6 %   Eosinophils Absolute 0.4 0.0 - 0.5 K/uL   Basophils Relative 1 %   Basophils Absolute 0.1 0.0 - 0.1 K/uL   Immature Granulocytes 0 %   Abs Immature Granulocytes 0.03 0.00 - 0.07 K/uL    Comment: Performed at [facility], [address] [place], [place] [address]  Salicylate level     Status: Abnormal   Collection Time: 04/17/23  2:54 PM  Result Value Ref Range   Salicylate Lvl <7.0 (L) 7.0 - 30.0 mg/dL    Comment: Performed at [facility], [address] [place], [place] [address]  Acetaminophen level     Status: None   Collection Time: 04/17/23  2:54 PM  Result Value Ref Range   Acetaminophen (Tylenol), Serum 13 10 - 30 ug/mL    Comment: (NOTE) Therapeutic concentrations vary significantly. A range of 10-30 ug/mL  may be an effective concentration for many patients. However, some  are best treated at concentrations outside of this range. Acetaminophen concentrations >150 ug/mL at 4 hours after ingestion  and >50 ug/mL at 12 hours after ingestion are often associated with  toxic reactions.  Performed at [facility], [address] [place], [place] [address]   Urinalysis, Routine w reflex microscopic -Urine, Clean Catch     Status: Abnormal   Collection Time: 04/17/23  2:54 PM  Result Value Ref Range   Color, Urine YELLOW YELLOW   APPearance CLEAR CLEAR   Specific Gravity, Urine >=1.030 1.005 - 1.030   pH 5.0 5.0 - 8.0   Glucose, UA 100 (A) NEGATIVE mg/dL   Hgb urine dipstick SMALL (A) NEGATIVE   Bilirubin Urine NEGATIVE NEGATIVE   Ketones, ur NEGATIVE NEGATIVE mg/dL   Protein, ur 536 (A) NEGATIVE mg/dL   Nitrite NEGATIVE NEGATIVE   Leukocytes,Ua  NEGATIVE NEGATIVE    Comment: Performed at [facility], [address] [place], [place] [address]  Urinalysis, Microscopic (reflex)     Status: Abnormal   Collection Time: 04/17/23  2:54 PM  Result Value Ref Range   RBC / HPF 0-5 0 - 5 RBC/hpf   WBC, UA 0-5 0 - 5 WBC/hpf   Bacteria, UA FEW (A) NONE SEEN   Squamous Epithelial / HPF 0-5 0 - 5 /HPF   Granular Casts, UA PRESENT     Comment: Performed at [facility], [address] [place], [place] [address]  CBG monitoring, ED     Status: Abnormal   Collection Time: 04/17/23  8:14  PM  Result Value Ref Range   Glucose-Capillary 197 (H) 70 - 99 mg/dL    Comment: Glucose reference range applies only to samples taken after fasting for at least 8 hours.  POC CBG, ED     Status: Abnormal   Collection Time: 04/18/23 10:02 AM  Result Value Ref Range   Glucose-Capillary 282 (H) 70 - 99 mg/dL    Comment: Glucose reference range applies only to samples taken after fasting for at least 8 hours.   Comment 1 Notify RN     Medications:  Current Facility-Administered Medications  Medication Dose Route Frequency Provider Last Rate Last Admin   albuterol (VENTOLIN HFA) 108 (90 Base) MCG/ACT inhaler 1-2 puff  1-2 puff Inhalation Q4H PRN Chestine Spore, Meghan R, PA-C   2 puff at 04/18/23 0753   amLODipine (NORVASC) tablet 5 mg  5 mg Oral Daily Michelle Piper, PA-C   5 mg at 04/18/23 1017   busPIRone (BUSPAR) tablet 5 mg  5 mg Oral TID Michelle Piper, PA-C       cholecalciferol (VITAMIN D3) 25 MCG (1000 UNIT) tablet 5,000 Units  5,000 Units Oral Daily Michelle Piper, PA-C   5,000 Units at 04/18/23 1016   clopidogrel (PLAVIX) tablet 75 mg  75 mg Oral Daily Michelle Piper, PA-C   75 mg at 04/18/23 1019   ezetimibe (ZETIA) tablet 10 mg  10 mg Oral Daily Schutt, Alexander M, PA-C       fluticasone [facility]) 50 MCG/ACT nasal spray 1 spray  1 spray Each Nare Daily Michelle Piper, PA-C   1 spray at 04/18/23 1020    furosemide (LASIX) tablet 20 mg  20 mg Oral Daily Michelle Piper, PA-C   20 mg at 04/18/23 1018   hydrOXYzine (ATARAX) tablet 25 mg  25 mg Oral BID Michelle Piper, PA-C   25 mg at 04/18/23 1019   insulin glargine-yfgn (SEMGLEE) injection 30 Units  30 Units Subcutaneous BID Michelle Piper, [place]   30 Units at 04/18/23 1018   insulin isophane & regular human KwikPen (HUMULIN 70/30 MIX) (70-30) 100 UNIT/ML KwikPen 25 Units  25 Units Subcutaneous BID AC Schutt, Alexander M, PA-C       levothyroxine (SYNTHROID) tablet 50 mcg  50 mcg Oral Q0600 Michelle Piper, PA-C   50 mcg at 04/18/23 0726   linagliptin (TRADJENTA) tablet 5 mg  5 mg Oral Daily Michelle Piper, PA-C       metoprolol succinate (TOPROL-XL) 24 hr tablet 50 mg  50 mg Oral Daily Michelle Piper, PA-C   50 mg at 04/18/23 1017   montelukast (SINGULAIR) tablet 10 mg  10 mg Oral Daily Schutt, Alexander M, PA-C       potassium chloride SA (KLOR-CON M) CR tablet 10 mEq  10 mEq Oral Daily Alvira Monday, MD   10 mEq at 04/18/23 1046   risperiDONE (RISPERDAL) tablet 0.5 mg  0.5 mg Oral BID Michelle Piper, PA-C   0.5 mg at 04/18/23 1017   rosuvastatin (CRESTOR) tablet 40 mg  40 mg Oral Daily Schutt, Edsel Petrin, PA-C       Current Outpatient Medications  Medication Sig Dispense Refill   Insulin Glargine (BASAGLAR KWIKPEN) 100 UNIT/ML 30 units twice per day     NOVOLIN R FLEXPEN RELION 100 UNIT/ML FlexPen Inject into the skin.     risperiDONE (RISPERDAL) 0.5 MG tablet Take 1 tablet (0.5 mg total) by mouth daily AND 2 tablets (1 mg  total) at bedtime. 21 tablet 0   ACCU-CHEK GUIDE test strip USE TO TEST BLOOD SUGAR 4 TIMES DAILY. 100 strip 0   albuterol (PROVENTIL HFA;VENTOLIN HFA) 108 (90 Base) MCG/ACT inhaler Inhale 2 puffs into the lungs every 6 (six) hours as needed for wheezing or shortness of breath. 1 Inhaler 0   amLODipine (NORVASC) 10 MG tablet Take 1 tablet (10 mg total) by mouth daily. For high blood pressure  30 tablet 0   BD PEN NEEDLE NANO U/F 32G X 4 MM MISC USE AS DIRECTED TWICE DAILY 100 each 0   benzonatate (TESSALON) 100 MG capsule Take by mouth 2 (two) times daily as needed for cough.     blood glucose meter kit and supplies KIT 1 each by Does not apply route 4 (four) times daily. Dispense based on patient and insurance preference. Use up to four times daily as directed. (FOR ICD-9 250.00, 250.01). 1 each 0   busPIRone (BUSPAR) 5 MG tablet Take 5 mg by mouth 3 (three) times daily.     Cholecalciferol (VITAMIN D3) 125 MCG (5000 UT) CAPS Take 1 capsule (5,000 Units total) by mouth daily. 90 capsule 0   clopidogrel (PLAVIX) 75 MG tablet Take 1 tablet (75 mg total) by mouth daily. 30 tablet 0   DULoxetine (CYMBALTA) 60 MG capsule Take 1 capsule (60 mg total) by mouth 2 (two) times daily. (Patient not taking: Reported on 02/01/2023) 60 capsule 3   ezetimibe (ZETIA) 10 MG tablet Take 1 tablet (10 mg total) by mouth daily. 90 tablet 3   fluticasone (FLONASE) 50 MCG/ACT nasal spray Place 1 spray into both nostrils daily.     furosemide (LASIX) 20 MG tablet Take 1 tablet (20 mg total) by mouth daily. 30 tablet    glipiZIDE (GLUCOTROL) 10 MG tablet Take 1 tablet (10 mg total) by mouth 2 (two) times daily before a meal. (Patient not taking: Reported on 10/18/2022) 60 tablet 0   hydrOXYzine (ATARAX) 50 MG tablet Take 50 mg by mouth 2 (two) times daily.     insulin isophane & regular human KwikPen (NOVOLIN 70/30 KWIKPEN) (70-30) 100 UNIT/ML KwikPen Inject 25 Units into the skin 2 (two) times daily before lunch and supper.     JANUVIA 25 MG tablet Take 100 mg by mouth daily.     levothyroxine (SYNTHROID, LEVOTHROID) 50 MCG tablet Take 1 tablet (50 mcg total) by mouth daily before breakfast. For hypothyroidism 30 tablet 0   methocarbamol (ROBAXIN) 500 MG tablet Take 500 mg by mouth every 8 (eight) hours as needed for muscle spasms.     metoprolol succinate (TOPROL-XL) 50 MG 24 hr tablet Take 50 mg by mouth daily.  Take with or immediately following a meal.     montelukast (SINGULAIR) 10 MG tablet Take 10 mg by mouth daily.     nitroGLYCERIN (NITROSTAT) 0.4 MG SL tablet Place 1 tablet (0.4 mg total) under the tongue every 5 (five) minutes as needed for chest pain. 25 tablet 3   NOVOLOG FLEXPEN 100 UNIT/ML FlexPen Inject 7 Units into the skin 3 (three) times daily with meals.     ondansetron (ZOFRAN-ODT) 4 MG disintegrating tablet 4mg  ODT q4 hours prn nausea/vomit 10 tablet 0   oxyCODONE-acetaminophen (PERCOCET/ROXICET) 5-325 MG tablet Take 1 tablet by mouth every 6 (six) hours as needed for severe pain. 15 tablet 0   phenazopyridine (PYRIDIUM) 100 MG tablet Take 100 mg by mouth 3 (three) times daily as needed for pain.  potassium chloride (KLOR-CON) 10 MEQ tablet Take 10 mEq by mouth daily.     predniSONE (DELTASONE) 20 MG tablet 2 tabs po daily x 3 days (Patient not taking: Reported on 02/01/2023) 6 tablet 0   rizatriptan (MAXALT) 5 MG tablet Take 5 mg by mouth daily as needed for migraine.     rosuvastatin (CRESTOR) 40 MG tablet Take 1 tablet (40 mg total) by mouth daily. 90 tablet 3   traMADol (ULTRAM) 50 MG tablet Take 25 mg by mouth 3 (three) times daily as needed.     traZODone (DESYREL) 50 MG tablet Take 100 mg by mouth at bedtime.     Vitamin D, Ergocalciferol, (DRISDOL) 1.25 MG (50000 UNIT) CAPS capsule Take 1 capsule (50,000 Units total) by mouth every 7 (seven) days. 12 capsule 0    Musculoskeletal: Strength & Muscle Tone: within normal limits Gait & Station: normal Patient leans: N/A          Psychiatric Specialty Exam:  Presentation  General Appearance:  Appropriate for Environment  Eye Contact: Good  Speech: Clear and Coherent; Normal Rate  Speech Volume: Normal  Handedness: Right   Mood and Affect  Mood: Anxious; Dysphoric  Affect: Appropriate; Congruent   Thought Process  Thought Processes: Coherent; Goal Directed  Descriptions of  Associations:Intact  Orientation:Full (Time, Place and Person)  Thought Content:Logical  History of Schizophrenia/Schizoaffective disorder:No  Duration of Psychotic Symptoms:N/A  Hallucinations:Hallucinations: Auditory; Visual Description of Auditory Hallucinations: Reports she has seen the devil in her apartment and a black woman that walked through the wall when her nurse came in room today Description of Visual Hallucinations: States she is hearing noises like rats chewing on paper prior to seeing the devil, and the devil laughing at her.  Ideas of Reference:None  Suicidal Thoughts:Suicidal Thoughts: No  Homicidal Thoughts:Homicidal Thoughts: No   Sensorium  Memory: Immediate Good; Recent Good; Remote Good  Judgment: Intact  Insight: Present   Executive Functions  Concentration: Good  Attention Span: Good  Recall: Good  Fund of Knowledge: Good  Language: Good   Psychomotor Activity  Psychomotor Activity: Psychomotor Activity: Normal   Assets  Assets: Communication Skills; Desire for Improvement; Financial Resources/Insurance; Housing; Resilience; Social Support   Sleep  Sleep: Sleep: Fair    Physical Exam: Physical Exam Vitals and nursing note reviewed.  Constitutional:      General: She is not in acute distress.    Appearance: Normal appearance. She is not ill-appearing.  Cardiovascular:     Rate and Rhythm: Normal rate.     Comments: Abnormal blood pressure  Pulmonary:     Effort: Pulmonary effort is normal. No respiratory distress.  Neurological:     Mental Status: She is alert and oriented to person, place, and time.  Psychiatric:        Attention and Perception: Attention normal. She perceives auditory (on/off) and visual (on/off) hallucinations.        Mood and Affect: Mood is anxious. Depressed: dysphoric.       Speech: Speech normal.        Behavior: Behavior normal. Behavior is cooperative.        Thought Content: Thought  content normal. Thought content is not paranoid or delusional. Thought content does not include homicidal or suicidal ideation.        Cognition and Memory: Cognition normal.        Judgment: Judgment normal.    Review of Systems  Constitutional:        No other  complaints voiced   All other systems reviewed and are negative.  Blood pressure (!) 172/83, pulse 73, temperature 98.2 F (36.8 C), temperature source Oral, resp. rate 18, height 5\' 5"  (1.651 m), weight (!) 137.9 kg, SpO2 99%. Body mass index is 50.59 kg/m.  Treatment Plan Summary: Medication management and Plan Keep scheduled appointment with [facility] Thursday 04/21/23 for medication management.  Prescription for Risperdal 0.5 mg Q AM and 1 mg Q hs.   Follow-up Information     [facility]. Go on 04/21/2023.   Specialty: Behavioral Health Why: You have a scheduled appointment with May Ann Coleman, PMHNP-BC on 04/21/2023 at 7:45 AM for medication management. Contact information: [address] [place] [address] [phone]                  Disposition: Psychiatrically cleared No evidence of imminent risk to self or others at present.   Patient does not meet criteria for psychiatric inpatient admission. Supportive therapy provided about ongoing stressors. Discussed crisis plan, support from social network, calling 911, coming to the Emergency Department, and calling Suicide Hotline.  This service was provided via telemedicine using a 2-way, interactive audio and video technology.  Names of all persons participating in this telemedicine service and their role in this encounter. Name: Assunta Found Role: NP  Name: Marcheta Grammes Role: Patient  Name:  Role:   Name:  Role:    Secure message sent to patients nurse Genia Hotter, RN informing:  Psychiatric consult completed and patient psychiatrically cleared. She will need a prescription for Risperdal 0.5 mg  take one tablet (0.5mg ) every morning and 2 tablets (1 mg) at bedtime. Will need enough medication to make sure she doesn't run out prior to her psychiatric visit on Thursday 04/21/23. I've spoken to her daughter and wants to be notified when she is ready to be picked up. Please inform MD only default listed.   Thomasena Vandenheuvel, NP 04/18/2023 12:14 PM

## 2023-04-18 NOTE — Discharge Instructions (Addendum)
Follow-up with psychiatry as above.  Return for new or worsening symptoms.  You should also follow-up with your primary care provider.

## 2023-04-18 NOTE — ED Notes (Signed)
Pt went home with daughter and Medications were returned from [facility] and given back to pt.

## 2023-04-18 NOTE — ED Notes (Signed)
Pt toileted and offered breakfast. Denies hallucinations, SI, and homicidal thoughts at this time.

## 2023-04-26 ENCOUNTER — Other Ambulatory Visit: Payer: Self-pay

## 2023-04-26 MED ORDER — METOPROLOL SUCCINATE ER 50 MG PO TB24: 50.0000 mg | ORAL_TABLET | Freq: Every day | ORAL | 2 refills | Status: DC

## 2023-06-20 ENCOUNTER — Other Ambulatory Visit: Payer: Self-pay | Admitting: *Deleted

## 2023-06-21 MED ORDER — CLOPIDOGREL BISULFATE 75 MG PO TABS: 75.0000 mg | ORAL_TABLET | Freq: Every day | ORAL | 3 refills | Status: DC

## 2023-06-21 MED ORDER — EZETIMIBE 10 MG PO TABS: 10.0000 mg | ORAL_TABLET | Freq: Every day | ORAL | 3 refills | Status: DC

## 2023-06-21 MED ORDER — ROSUVASTATIN CALCIUM 40 MG PO TABS: 40.0000 mg | ORAL_TABLET | Freq: Every day | ORAL | 3 refills | Status: DC

## 2024-01-04 ENCOUNTER — Other Ambulatory Visit: Payer: Self-pay | Admitting: Internal Medicine

## 2024-02-03 ENCOUNTER — Other Ambulatory Visit: Payer: Self-pay | Admitting: Internal Medicine

## 2024-02-23 ENCOUNTER — Other Ambulatory Visit: Payer: Self-pay | Admitting: Internal Medicine

## 2024-03-12 ENCOUNTER — Other Ambulatory Visit (HOSPITAL_BASED_OUTPATIENT_CLINIC_OR_DEPARTMENT_OTHER): Payer: Self-pay

## 2024-03-12 MED ORDER — METOPROLOL SUCCINATE ER 25 MG PO TB24
25.0000 mg | ORAL_TABLET | Freq: Every day | ORAL | 0 refills | Status: DC
  Filled 2024-03-12: qty 30, 30d supply, fill #0

## 2024-03-13 ENCOUNTER — Other Ambulatory Visit (HOSPITAL_BASED_OUTPATIENT_CLINIC_OR_DEPARTMENT_OTHER): Payer: Self-pay

## 2024-04-02 ENCOUNTER — Other Ambulatory Visit (HOSPITAL_BASED_OUTPATIENT_CLINIC_OR_DEPARTMENT_OTHER): Payer: Self-pay

## 2024-04-02 MED ORDER — METOPROLOL SUCCINATE ER 25 MG PO TB24
25.0000 mg | ORAL_TABLET | Freq: Every day | ORAL | 0 refills | Status: DC
  Filled 2024-04-02 – 2024-04-04 (×2): qty 30, 30d supply, fill #0

## 2024-04-04 ENCOUNTER — Other Ambulatory Visit (HOSPITAL_BASED_OUTPATIENT_CLINIC_OR_DEPARTMENT_OTHER): Payer: Self-pay

## 2024-04-05 ENCOUNTER — Other Ambulatory Visit: Payer: Self-pay | Admitting: Student

## 2024-04-16 ENCOUNTER — Other Ambulatory Visit (HOSPITAL_BASED_OUTPATIENT_CLINIC_OR_DEPARTMENT_OTHER): Payer: Self-pay

## 2024-04-16 MED ORDER — MEMANTINE HCL 5 MG PO TABS
5.0000 mg | ORAL_TABLET | Freq: Two times a day (BID) | ORAL | 2 refills | Status: DC
  Filled 2024-04-16: qty 60, 30d supply, fill #0
  Filled 2024-05-11: qty 60, 30d supply, fill #1

## 2024-04-17 ENCOUNTER — Other Ambulatory Visit (HOSPITAL_BASED_OUTPATIENT_CLINIC_OR_DEPARTMENT_OTHER): Payer: Self-pay

## 2024-04-18 ENCOUNTER — Other Ambulatory Visit (HOSPITAL_BASED_OUTPATIENT_CLINIC_OR_DEPARTMENT_OTHER): Payer: Self-pay

## 2024-04-18 ENCOUNTER — Other Ambulatory Visit: Payer: Self-pay

## 2024-04-23 ENCOUNTER — Other Ambulatory Visit: Payer: Self-pay

## 2024-04-23 ENCOUNTER — Other Ambulatory Visit (HOSPITAL_BASED_OUTPATIENT_CLINIC_OR_DEPARTMENT_OTHER): Payer: Self-pay

## 2024-04-23 MED ORDER — RISPERIDONE 2 MG PO TABS
ORAL_TABLET | ORAL | 0 refills | Status: AC
Start: 2024-04-23 — End: ?
  Filled 2024-04-23: qty 270, 90d supply, fill #0
  Filled 2024-04-23: qty 90, 30d supply, fill #0

## 2024-04-28 ENCOUNTER — Other Ambulatory Visit: Payer: Self-pay | Admitting: Student

## 2024-05-11 ENCOUNTER — Other Ambulatory Visit (HOSPITAL_BASED_OUTPATIENT_CLINIC_OR_DEPARTMENT_OTHER): Payer: Self-pay

## 2024-08-14 ENCOUNTER — Other Ambulatory Visit (HOSPITAL_BASED_OUTPATIENT_CLINIC_OR_DEPARTMENT_OTHER): Payer: Self-pay

## 2024-08-14 MED ORDER — FLUZONE 0.5 ML IM SUSY
0.5000 mL | PREFILLED_SYRINGE | Freq: Once | INTRAMUSCULAR | 0 refills | Status: AC
  Filled 2024-08-14: qty 0.5, 1d supply, fill #0
